# Patient Record
Sex: Female | Born: 1960 | Race: White | Hispanic: No | Marital: Married | Smoking: Former smoker
Health system: Southern US, Community
[De-identification: ages and names within clinical notes are randomized; demographics above are authoritative.]

## PROBLEM LIST (undated history)

## (undated) DIAGNOSIS — E785 Hyperlipidemia, unspecified: Secondary | ICD-10-CM

## (undated) DIAGNOSIS — J45909 Unspecified asthma, uncomplicated: Secondary | ICD-10-CM

## (undated) DIAGNOSIS — M1990 Unspecified osteoarthritis, unspecified site: Secondary | ICD-10-CM

## (undated) DIAGNOSIS — H547 Unspecified visual loss: Secondary | ICD-10-CM

## (undated) DIAGNOSIS — F419 Anxiety disorder, unspecified: Secondary | ICD-10-CM

## (undated) DIAGNOSIS — R011 Cardiac murmur, unspecified: Secondary | ICD-10-CM

## (undated) DIAGNOSIS — D649 Anemia, unspecified: Secondary | ICD-10-CM

## (undated) DIAGNOSIS — T7840XA Allergy, unspecified, initial encounter: Secondary | ICD-10-CM

## (undated) DIAGNOSIS — J189 Pneumonia, unspecified organism: Secondary | ICD-10-CM

## (undated) DIAGNOSIS — K449 Diaphragmatic hernia without obstruction or gangrene: Secondary | ICD-10-CM

## (undated) DIAGNOSIS — M79641 Pain in right hand: Secondary | ICD-10-CM

## (undated) DIAGNOSIS — Z87891 Personal history of nicotine dependence: Secondary | ICD-10-CM

## (undated) DIAGNOSIS — F32A Depression, unspecified: Secondary | ICD-10-CM

## (undated) DIAGNOSIS — M25559 Pain in unspecified hip: Secondary | ICD-10-CM

## (undated) DIAGNOSIS — Z789 Other specified health status: Secondary | ICD-10-CM

## (undated) DIAGNOSIS — I10 Essential (primary) hypertension: Secondary | ICD-10-CM

## (undated) DIAGNOSIS — N200 Calculus of kidney: Secondary | ICD-10-CM

## (undated) DIAGNOSIS — Z01419 Encounter for gynecological examination (general) (routine) without abnormal findings: Secondary | ICD-10-CM

## (undated) DIAGNOSIS — R0602 Shortness of breath: Secondary | ICD-10-CM

## (undated) DIAGNOSIS — Z87442 Personal history of urinary calculi: Secondary | ICD-10-CM

## (undated) DIAGNOSIS — K219 Gastro-esophageal reflux disease without esophagitis: Secondary | ICD-10-CM

## (undated) DIAGNOSIS — M169 Osteoarthritis of hip, unspecified: Secondary | ICD-10-CM

## (undated) DIAGNOSIS — G2581 Restless legs syndrome: Secondary | ICD-10-CM

## (undated) DIAGNOSIS — F329 Major depressive disorder, single episode, unspecified: Secondary | ICD-10-CM

## (undated) HISTORY — DX: Anxiety disorder, unspecified: F41.9

## (undated) HISTORY — PX: OTHER SURGICAL HISTORY: SHX169

## (undated) HISTORY — DX: Osteoarthritis of hip, unspecified: M16.9

## (undated) HISTORY — DX: Restless legs syndrome: G25.81

## (undated) HISTORY — DX: Shortness of breath: R06.02

## (undated) HISTORY — DX: Other specified health status: Z78.9

## (undated) HISTORY — DX: Pain in right hand: M79.641

## (undated) HISTORY — DX: Allergy, unspecified, initial encounter: T78.40XA

## (undated) HISTORY — DX: Unspecified asthma, uncomplicated: J45.909

## (undated) HISTORY — PX: WISDOM TOOTH EXTRACTION: SHX21

## (undated) HISTORY — DX: Calculus of kidney: N20.0

## (undated) HISTORY — DX: Hyperlipidemia, unspecified: E78.5

## (undated) HISTORY — DX: Unspecified visual loss: H54.7

## (undated) HISTORY — DX: Pain in unspecified hip: M25.559

## (undated) HISTORY — DX: Unspecified osteoarthritis, unspecified site: M19.90

## (undated) HISTORY — DX: Depression, unspecified: F32.A

## (undated) HISTORY — DX: Gastro-esophageal reflux disease without esophagitis: K21.9

## (undated) HISTORY — DX: Essential (primary) hypertension: I10

## (undated) HISTORY — DX: Diaphragmatic hernia without obstruction or gangrene: K44.9

## (undated) HISTORY — DX: Encounter for gynecological examination (general) (routine) without abnormal findings: Z01.419

## (undated) HISTORY — DX: Major depressive disorder, single episode, unspecified: F32.9

## (undated) HISTORY — DX: Personal history of nicotine dependence: Z87.891

---

## 1998-09-05 ENCOUNTER — Other Ambulatory Visit: Admission: RE | Admit: 1998-09-05 | Discharge: 1998-09-05 | Payer: Self-pay | Admitting: *Deleted

## 1999-04-18 ENCOUNTER — Encounter: Payer: Self-pay | Admitting: Family Medicine

## 1999-04-18 ENCOUNTER — Encounter: Admission: RE | Admit: 1999-04-18 | Discharge: 1999-04-18 | Payer: Self-pay | Admitting: Family Medicine

## 1999-09-10 ENCOUNTER — Other Ambulatory Visit: Admission: RE | Admit: 1999-09-10 | Discharge: 1999-09-10 | Payer: Self-pay | Admitting: *Deleted

## 2000-11-04 ENCOUNTER — Other Ambulatory Visit: Admission: RE | Admit: 2000-11-04 | Discharge: 2000-11-04 | Payer: Self-pay | Admitting: *Deleted

## 2005-12-11 ENCOUNTER — Ambulatory Visit: Payer: Self-pay | Admitting: Family Medicine

## 2005-12-13 ENCOUNTER — Other Ambulatory Visit: Admission: RE | Admit: 2005-12-13 | Discharge: 2005-12-13 | Payer: Self-pay | Admitting: Obstetrics and Gynecology

## 2005-12-13 ENCOUNTER — Ambulatory Visit (HOSPITAL_COMMUNITY): Admission: RE | Admit: 2005-12-13 | Discharge: 2005-12-13 | Payer: Self-pay | Admitting: Family Medicine

## 2006-02-04 ENCOUNTER — Ambulatory Visit: Payer: Self-pay | Admitting: Family Medicine

## 2006-06-30 ENCOUNTER — Ambulatory Visit: Payer: Self-pay | Admitting: Family Medicine

## 2006-07-01 ENCOUNTER — Emergency Department (HOSPITAL_COMMUNITY): Admission: EM | Admit: 2006-07-01 | Discharge: 2006-07-01 | Payer: Self-pay | Admitting: Emergency Medicine

## 2007-05-05 ENCOUNTER — Ambulatory Visit: Payer: Self-pay | Admitting: Family Medicine

## 2007-06-30 ENCOUNTER — Ambulatory Visit: Payer: Self-pay | Admitting: Family Medicine

## 2007-07-15 ENCOUNTER — Ambulatory Visit: Payer: Self-pay | Admitting: Family Medicine

## 2007-09-11 ENCOUNTER — Ambulatory Visit: Payer: Self-pay | Admitting: Family Medicine

## 2008-05-02 ENCOUNTER — Ambulatory Visit: Payer: Self-pay | Admitting: Family Medicine

## 2008-06-14 ENCOUNTER — Ambulatory Visit: Payer: Self-pay | Admitting: Family Medicine

## 2008-08-31 ENCOUNTER — Ambulatory Visit: Payer: Self-pay | Admitting: Family Medicine

## 2008-10-12 ENCOUNTER — Ambulatory Visit: Payer: Self-pay | Admitting: Family Medicine

## 2009-04-25 ENCOUNTER — Ambulatory Visit: Payer: Self-pay | Admitting: Family Medicine

## 2009-04-26 ENCOUNTER — Encounter: Admission: RE | Admit: 2009-04-26 | Discharge: 2009-04-26 | Payer: Self-pay | Admitting: Family Medicine

## 2009-05-01 ENCOUNTER — Ambulatory Visit: Payer: Self-pay | Admitting: Family Medicine

## 2009-07-19 ENCOUNTER — Other Ambulatory Visit: Admission: RE | Admit: 2009-07-19 | Discharge: 2009-07-19 | Payer: Self-pay | Admitting: Obstetrics and Gynecology

## 2009-07-19 ENCOUNTER — Ambulatory Visit: Payer: Self-pay | Admitting: Family Medicine

## 2009-11-09 ENCOUNTER — Ambulatory Visit: Payer: Self-pay | Admitting: Physician Assistant

## 2010-06-22 ENCOUNTER — Ambulatory Visit: Admit: 2010-06-22 | Payer: Self-pay | Admitting: Family Medicine

## 2010-06-22 ENCOUNTER — Ambulatory Visit
Admission: RE | Admit: 2010-06-22 | Discharge: 2010-06-22 | Payer: Self-pay | Source: Home / Self Care | Attending: Family Medicine | Admitting: Family Medicine

## 2010-09-12 ENCOUNTER — Ambulatory Visit (INDEPENDENT_AMBULATORY_CARE_PROVIDER_SITE_OTHER): Payer: Self-pay | Admitting: Family Medicine

## 2010-09-12 DIAGNOSIS — K117 Disturbances of salivary secretion: Secondary | ICD-10-CM

## 2010-09-12 DIAGNOSIS — I10 Essential (primary) hypertension: Secondary | ICD-10-CM

## 2010-09-12 DIAGNOSIS — E7800 Pure hypercholesterolemia, unspecified: Secondary | ICD-10-CM

## 2010-11-06 ENCOUNTER — Telehealth: Payer: Self-pay | Admitting: Family Medicine

## 2010-11-06 MED ORDER — MOMETASONE FUROATE 50 MCG/ACT NA SUSP: NASAL | Status: DC

## 2010-11-07 ENCOUNTER — Ambulatory Visit (INDEPENDENT_AMBULATORY_CARE_PROVIDER_SITE_OTHER): Payer: Self-pay | Admitting: Medical

## 2010-11-07 ENCOUNTER — Encounter: Payer: Self-pay | Admitting: Medical

## 2010-11-07 VITALS — BP 108/78 | HR 72 | Temp 98.3°F | Ht 62.0 in | Wt 185.0 lb

## 2010-11-07 DIAGNOSIS — J40 Bronchitis, not specified as acute or chronic: Secondary | ICD-10-CM

## 2010-11-07 DIAGNOSIS — B079 Viral wart, unspecified: Secondary | ICD-10-CM

## 2010-11-07 DIAGNOSIS — J329 Chronic sinusitis, unspecified: Secondary | ICD-10-CM

## 2010-11-07 DIAGNOSIS — B356 Tinea cruris: Secondary | ICD-10-CM

## 2010-11-07 MED ORDER — AZITHROMYCIN 250 MG PO TABS: ORAL_TABLET | ORAL | Status: AC

## 2010-11-07 MED ORDER — ESOMEPRAZOLE MAGNESIUM 40 MG PO CPDR: 40.0000 mg | DELAYED_RELEASE_CAPSULE | Freq: Every day | ORAL | Status: DC

## 2010-11-07 MED ORDER — BUDESONIDE-FORMOTEROL FUMARATE 160-4.5 MCG/ACT IN AERO: 2.0000 | INHALATION_SPRAY | Freq: Two times a day (BID) | RESPIRATORY_TRACT | Status: DC

## 2010-11-07 MED ORDER — NYSTATIN 100000 UNIT/GM EX CREA: TOPICAL_CREAM | Freq: Two times a day (BID) | CUTANEOUS | Status: DC

## 2010-11-07 NOTE — Patient Instructions (Signed)
Bronchitis and sinusitis -- discussed symptomatic treatment, can use Coricidin HBP over-the-counter, Z-Pak, cough not improving or worse  Tinea cruris/vaginal rash-nystatin cream topically twice a day, keep area dry  Warts-continue your current treatment

## 2010-11-07 NOTE — Progress Notes (Signed)
Subjective:     Shannon Mckenzie is a 50 y.o. female who presents for evaluation of productive cough.  Symptoms include congestion, facial pain, fever subjective, shortness of breath, sinus pressure and sore throat. Onset of symptoms was 2 weeks ago, and has been gradually worsening since that time. Treatment to date: nasal steroids.  They note hx/o bronchitis in the past.  Denies sick contacts.  No other aggravating or relieving factors.    She complains of dry mouth. Was seen on 09/12/10 here for the same, given Magic Mouthwash, and she used by [employer] over-the-counter, and this has improved but not completely gone away.  She complains of a rash on her genital area and x2 weeks, and she notes staying moist and sweaty in this area, change her underwear regularly. Using some over-the-counter vaginal itch cream with some relief. Denies blisters or sores, no concern for STD and, is married.  She notes that she gets warts somewhat frequently, and using a medicated Band-Aid for her current wart on her right thigh.  Past Medical History  Diagnosis Date  . Hiatal hernia   . GERD (gastroesophageal reflux disease)   . Allergy   . Hyperlipidemia   . Anxiety   . COPD (chronic obstructive pulmonary disease)   . DJD (degenerative joint disease) of hip      The following portions of the patient's history were reviewed and updated as appropriate: allergies, current medications, past family history, past medical history, past social history, past surgical history and problem list.  Review of Systems Constitutional: +low grade fever; denies chills, sweats, anorexia Skin: Positive vaginal rash, positive for wart right HEENT: +sore throat; denies ear pain, itchy watery eyes Cardiovascular: denies chest pain, palpitations Lungs: +mild SOB, +productive sputum; denies wheezing, hemoptysis, orthopnea, PND Abdomen: denies abdominal pain, nausea, vomiting, diarrhea GU: denies dysuria Extremities: denies  edema, myalgias, arthralgias  Objective:   Filed Vitals:   11/07/10 1025  BP: 108/78  Pulse: 72  Temp: 98.3 F (36.8 C)    General appearance: Alert, WD/WN, no distress, mildly ill appearing                             Skin: Intertrigous areas bilateral in the genital region with linear patch of erythema that is flat and circumscribed, right upper thigh with 1 cm raised verruca appearing lesion that appears white treatment, otherwise no lesions, warm, no diaphoresis                           Head: Mild maxillary sinus tenderness                            Eyes: conjunctiva normal, corneas clear, PERRLA                            Ears: pearly TMs, external ear canals normal                          Nose: septum midline, turbinates swollen, with erythema and clear discharge             Mouth/throat: MMM, tongue normal, mild pharyngeal erythema                           Neck: supple,  no adenopathy, no thyromegaly, nontender                          Heart: RRR, normal S1, S2, no murmurs                         Lungs: +bronchial breath sounds, +scattered rhonchi, no wheezes, no rales                Extremities: no edema, nontender     Assessment:   Encounter Diagnoses  Name Primary?  . Sinusitis   . Bronchitis Yes  . Tinea cruris   . Viral warts      Plan:   Bronchitis and sinusitis-Prescription given today for Zpak as below.  Discussed diagnosis and treatment of bronchitis and sinusitis.  Suggested symptomatic OTC remedies for cough and congestion.  Nasal saline spray for nasal congestion.  Tylenol or Ibuprofen OTC for fever and malaise.  Call/return in 2-3 days if symptoms are worse or not improving.  Advised that cough may linger even after the infection is improved.     Dry mouth, improved, and that she always rinse with water after using her Symbicort  Tinea cruris-nystatin cream  Warts-continue over-the-counter wart remover as it is helping  I refilled her medications  (Symbicort and Nexium) and completed the form for [employer] Patient Assistance Program

## 2010-11-08 NOTE — Telephone Encounter (Signed)
Samples given.  

## 2010-11-21 ENCOUNTER — Telehealth: Payer: Self-pay | Admitting: Family Medicine

## 2010-11-21 MED ORDER — FLUTICASONE PROPIONATE (INHAL) 50 MCG/BLIST IN AEPB: 2.0000 | INHALATION_SPRAY | Freq: Two times a day (BID) | RESPIRATORY_TRACT | Status: DC

## 2010-11-21 NOTE — Telephone Encounter (Signed)
Pt needs refill on Fluticasone to [facility] [address]

## 2010-11-26 ENCOUNTER — Telehealth: Payer: Self-pay | Admitting: Family Medicine

## 2010-11-26 MED ORDER — FLUTICASONE PROPIONATE 50 MCG/ACT NA SUSP: 1.0000 | Freq: Every day | NASAL | Status: DC

## 2010-11-26 NOTE — Telephone Encounter (Signed)
Med sent in.

## 2010-12-13 ENCOUNTER — Other Ambulatory Visit: Payer: Self-pay | Admitting: Family Medicine

## 2011-03-18 ENCOUNTER — Other Ambulatory Visit: Payer: Self-pay | Admitting: Family Medicine

## 2011-03-19 ENCOUNTER — Other Ambulatory Visit: Payer: Self-pay

## 2011-03-19 NOTE — Telephone Encounter (Signed)
Is this ok?

## 2011-03-19 NOTE — Telephone Encounter (Signed)
Looks like Dr.L refilled zoloft

## 2011-03-30 ENCOUNTER — Other Ambulatory Visit: Payer: Self-pay | Admitting: Family Medicine

## 2011-04-01 DIAGNOSIS — Z139 Encounter for screening, unspecified: Secondary | ICD-10-CM

## 2011-05-08 ENCOUNTER — Encounter: Payer: Self-pay | Admitting: Family Medicine

## 2011-07-09 ENCOUNTER — Encounter: Payer: Self-pay | Admitting: Internal Medicine

## 2011-07-11 ENCOUNTER — Other Ambulatory Visit: Payer: Self-pay | Admitting: Family Medicine

## 2011-08-20 ENCOUNTER — Telehealth: Payer: Self-pay | Admitting: Internal Medicine

## 2011-08-20 NOTE — Telephone Encounter (Signed)
pt states that she thinks she has a sinus infection. she has drainage in throat and head hurts and she coughs up green stuff and wanted to know if you would call something in for her, [facility] at [address]. she said she don't have insurance so she dont really have the money to come in

## 2011-08-20 NOTE — Telephone Encounter (Signed)
Pt has appt

## 2011-08-20 NOTE — Telephone Encounter (Signed)
Tell her I will work with her to keep her cost down but she needs to come in for an appointment

## 2011-08-21 ENCOUNTER — Telehealth: Payer: Self-pay | Admitting: Internal Medicine

## 2011-08-21 ENCOUNTER — Encounter: Payer: Self-pay | Admitting: Family Medicine

## 2011-08-21 ENCOUNTER — Ambulatory Visit (INDEPENDENT_AMBULATORY_CARE_PROVIDER_SITE_OTHER): Payer: Self-pay | Admitting: Family Medicine

## 2011-08-21 VITALS — BP 130/80 | HR 79 | Temp 98.3°F | Wt 176.0 lb

## 2011-08-21 DIAGNOSIS — J019 Acute sinusitis, unspecified: Secondary | ICD-10-CM

## 2011-08-21 DIAGNOSIS — J209 Acute bronchitis, unspecified: Secondary | ICD-10-CM

## 2011-08-21 MED ORDER — LEVOFLOXACIN 500 MG PO TABS: 500.0000 mg | ORAL_TABLET | Freq: Every day | ORAL | Status: DC

## 2011-08-21 MED ORDER — AZITHROMYCIN 250 MG PO TABS: ORAL_TABLET | ORAL | Status: AC

## 2011-08-21 NOTE — Progress Notes (Signed)
  Subjective:    Patient ID: Shannon Mckenzie, female    DOB: 05/08/1961, 51 y.o.   MRN: [id number]  HPI She has a week long history of nasal congestion, occasional fluid feeling in her left ear. She now has a productive cough. The nasal congestion and headache are getting worse. She does not smoke and does not have allergies   Review of Systems     Objective:   Physical Exam alert and in no distress. Tympanic membranes and canals are normal. Throat is clear. Tonsils are normal. Neck is supple without adenopathy or thyromegaly. Cardiac exam shows a regular sinus rhythm without murmurs or gallops. Lungs are clear to auscultation.       Assessment & Plan:   1. Sinusitis acute   2. Bronchitis, acute    I will give her Levaquin to help keep her cost down. She will call if not entirely better.

## 2011-08-21 NOTE — Telephone Encounter (Signed)
Cancelled the levaquin cause it costed $90.70. And got permission from [employer] [employer] to do the zpak #[id number]. Take 2 pills on day 1 and 1 pill days 2-5 no refills.

## 2011-09-25 ENCOUNTER — Other Ambulatory Visit: Payer: Self-pay | Admitting: Family Medicine

## 2011-10-21 ENCOUNTER — Other Ambulatory Visit: Payer: Self-pay | Admitting: Family Medicine

## 2011-10-28 ENCOUNTER — Encounter: Payer: Self-pay | Admitting: Medical

## 2011-10-28 ENCOUNTER — Ambulatory Visit (INDEPENDENT_AMBULATORY_CARE_PROVIDER_SITE_OTHER): Payer: Self-pay | Admitting: Medical

## 2011-10-28 VITALS — BP 112/78 | HR 78 | Temp 98.1°F | Resp 16 | Wt 178.0 lb

## 2011-10-28 DIAGNOSIS — F32A Depression, unspecified: Secondary | ICD-10-CM

## 2011-10-28 DIAGNOSIS — J45909 Unspecified asthma, uncomplicated: Secondary | ICD-10-CM

## 2011-10-28 DIAGNOSIS — J449 Chronic obstructive pulmonary disease, unspecified: Secondary | ICD-10-CM

## 2011-10-28 DIAGNOSIS — F329 Major depressive disorder, single episode, unspecified: Secondary | ICD-10-CM

## 2011-10-28 MED ORDER — ALBUTEROL SULFATE HFA 108 (90 BASE) MCG/ACT IN AERS: 2.0000 | INHALATION_SPRAY | Freq: Four times a day (QID) | RESPIRATORY_TRACT | Status: DC | PRN

## 2011-10-28 NOTE — Patient Instructions (Signed)
Symptoms today suggest allergic bronchitis.  Recommendations:  continue Allegra daily at bedtime for the next 2-4 weeks  Continue Symbiotic 2 puffs twice daily for prevention   Begin Albuterol RESCUE inhaler, 2 puffs every 4-6 hours as needed, or at least twice daily the next 5-7 days  Don't do anything really strenuous for the next few days  Avoid begin outside when the pollen is particularly bad  Use some Ibuprofen or Aleve at least twice daily this week

## 2011-10-28 NOTE — Progress Notes (Signed)
Subjective: Here for c/o intermittent cough, chest congestion, and worse symptoms with deep breathing.  She has seasonal allergies in general, been having sinus pressure, headache, hoarse voice, nasal congestion.  In the last few days worse cough, clear production, ongoing drainage and head congestion, but also been feeling some SOB and wheezing at times. She has hx/o COPD, stopped smoking 15-20 years ago. Been using Symbicort twice daily, but has not had albuterol inhaler in some time now.  Last week her chest congestion was worse on the left, but now right side and all of chest is tight, achy, and deep breathing makes chest hurt worse.  She denies fever, chest pain, palpitations, no sick contacts, no edema.  Has hx/o GERD, but no recent worsening reflux, heartburn.  Uses Nexium daily.     She notes lately having more stress, irritability.  Her son moved in with her 652mo ago.  He separated from wife 652mo ago, and the housing was suppose to be temporary, but he has been there 27mo+.  He is 50 yo.  She also notes stressing about her COPD diagnosis, routine daily stress such as finances, work.  She works Education officer, environmental houses.  Been on Zoloft at higher and lower doses prior.    Past Medical History  Diagnosis Date  . Hiatal hernia   . GERD (gastroesophageal reflux disease)   . Allergy   . Hyperlipidemia   . Anxiety   . COPD (chronic obstructive pulmonary disease)   . DJD (degenerative joint disease) of hip   . Depression   . DJD (degenerative joint disease)   . Substance abuse     FORMER SMOKER  . Dyslipidemia   . HH (hiatus hernia)     Objective:   Physical Exam  Filed Vitals:   10/28/11 1045  BP: 112/78  Pulse: 78  Temp: 98.1 F (36.7 C)  Resp: 16    General appearance: alert, no distress, WD/WN HEENT: normocephalic, sclerae anicteric, TMs pearly, nares with turbinate swelling and clear discharge or erythema, pharynx normal Oral cavity: MMM, no lesions Neck: supple, no lymphadenopathy, no  thyromegaly, no masses Heart: RRR, normal S1, S2, no murmurs Lungs: few rhonchi, otherwise no wheezes, rales Pulses: 2+ symmetric Ext: no edema, cyanosis   Assessment and Plan :    Encounter Diagnoses  Name Primary?  . Allergic bronchitis Yes  . COPD (chronic obstructive pulmonary disease)   . Depression    Allergic bronchitis - Symptoms and exam suggest allergic bronchitis.  Gave albuterol sample, gave demo, and had her use 2 puffs in office.   After about 10 minutes felt improvement.  Advise she begin Allegra daily, c/t Symbicort, use albuterol 2 puffs q4-6 hours, avoid allergen triggers, and for chest achiness, begin Aleve.  Call if worse or not improving.  Recheck 2wk.   COPD - c/t Symbicort BID.  Depression - c/t Zoloft, work on stress reduction. Advised she discuss with son about him getting his own housing.

## 2011-11-14 ENCOUNTER — Other Ambulatory Visit: Payer: Self-pay | Admitting: Medical

## 2011-11-14 MED ORDER — ESOMEPRAZOLE MAGNESIUM 40 MG PO CPDR: 40.0000 mg | DELAYED_RELEASE_CAPSULE | Freq: Every day | ORAL | Status: DC

## 2011-11-14 MED ORDER — BUDESONIDE-FORMOTEROL FUMARATE 160-4.5 MCG/ACT IN AERO: 2.0000 | INHALATION_SPRAY | Freq: Two times a day (BID) | RESPIRATORY_TRACT | Status: DC

## 2011-12-03 ENCOUNTER — Telehealth: Payer: Self-pay | Admitting: Medical

## 2011-12-03 NOTE — Telephone Encounter (Signed)
LM

## 2012-01-20 ENCOUNTER — Other Ambulatory Visit: Payer: Self-pay

## 2012-01-20 MED ORDER — LISINOPRIL-HYDROCHLOROTHIAZIDE 10-12.5 MG PO TABS: 1.0000 | ORAL_TABLET | Freq: Every day | ORAL | Status: DC

## 2012-01-20 NOTE — Telephone Encounter (Signed)
Med sent in.

## 2012-03-20 ENCOUNTER — Encounter: Payer: Self-pay | Admitting: Medical

## 2012-03-20 ENCOUNTER — Telehealth: Payer: Self-pay | Admitting: Family Medicine

## 2012-03-20 ENCOUNTER — Ambulatory Visit (INDEPENDENT_AMBULATORY_CARE_PROVIDER_SITE_OTHER): Payer: BC Managed Care – PPO | Admitting: Medical

## 2012-03-20 VITALS — BP 130/82 | HR 68 | Temp 98.1°F | Resp 16 | Wt 185.0 lb

## 2012-03-20 DIAGNOSIS — K219 Gastro-esophageal reflux disease without esophagitis: Secondary | ICD-10-CM

## 2012-03-20 DIAGNOSIS — R490 Dysphonia: Secondary | ICD-10-CM

## 2012-03-20 DIAGNOSIS — J329 Chronic sinusitis, unspecified: Secondary | ICD-10-CM

## 2012-03-20 DIAGNOSIS — Z23 Encounter for immunization: Secondary | ICD-10-CM

## 2012-03-20 MED ORDER — AZITHROMYCIN 250 MG PO TABS: ORAL_TABLET | ORAL | Status: DC

## 2012-03-20 MED ORDER — NASONEX 50 MCG/ACT NA SUSP: NASAL | Status: DC

## 2012-03-20 NOTE — Progress Notes (Signed)
Subjective: Here for possible sinus infection.   She notes several day hx/o sinus pressure, eye pressure, can't breath through nose, nasal congestion, sore throat, ears bothering her, and she notes hx/o sinus infections.   Using nothing for symptoms.  She ran out of nasonex.  She is a former smoker.    She also notes hoarseness intermittent for a few weeks. Loses voice at times, but no sore throat.    She has a long hx/o GERD, x over 10 years.  No prior colonoscopy.    She also reports fatigue, night seats at times, but still getting normal periods.    Past Medical History  Diagnosis Date  . Hiatal hernia   . GERD (gastroesophageal reflux disease)   . Allergy   . Hyperlipidemia   . Anxiety   . COPD (chronic obstructive pulmonary disease)   . DJD (degenerative joint disease) of hip   . Depression   . DJD (degenerative joint disease)   . Substance abuse     FORMER SMOKER  . Dyslipidemia   . HH (hiatus hernia)    ROS as noted above in HPI   Objective:   Physical Exam  Filed Vitals:   03/20/12 0935  BP: 130/82  Pulse: 68  Temp: 98.1 F (36.7 C)  Resp: 16    General appearance: alert, no distress, WD/WN HEENT: normocephalic, sclerae anicteric, +sinus tenderness, TMs pearly, nares patent, no discharge or erythema, pharynx normal Oral cavity: MMM, no lesions Neck: supple, no lymphadenopathy, no thyromegaly, no masses Heart: RRR, normal S1, S2, no murmurs Lungs: CTA bilaterally, no wheezes, rhonchi, or rales Abdomen: +bs, soft, non tender, non distended, no masses, no hepatomegaly, no splenomegaly   Assessment and Plan :    Encounter Diagnoses  Name Primary?  . Sinusitis Yes  . Hoarseness   . GERD (gastroesophageal reflux disease)   . Need for influenza vaccination   . Need for pneumococcal vaccination    Sinusitis - begin Zpak, Mucinex OTC, increase water intake.  Rest, call if not resolving. Refilled Fluticasone.  Hoarsness/GERD - possibly GERD related.  Will  refer to GI for eval which will likely include EGD for 10+ years hx/o GERD, screening colonoscopy and to help evaluate for hoarseness.    Vaccine counseling, VIS and vaccine given for influenza and pneumococcal.    Return soon for physical.

## 2012-03-20 NOTE — Telephone Encounter (Signed)
PATIENT IS AWARE OF HER APPOINTMENT AT [facility] TO SEE DR. Randa Evens ON 04/13/12 @ 300 PM. [phone] CLS  I FAX OVER OV NOTES, LAB AND DEMOGRAPHICS. CLS

## 2012-03-23 ENCOUNTER — Telehealth: Payer: Self-pay | Admitting: Family Medicine

## 2012-03-23 NOTE — Telephone Encounter (Signed)
Message copied by Janeice Robinson on Mon Mar 23, 2012  9:15 AM ------      Message from: Jac Canavan      Created: Fri Mar 20, 2012 10:41 PM       Refer to GI/ Dr. Randa Evens across from [facility] for long hx/o GERD, hoarseness, first screening colonoscopy.

## 2012-03-23 NOTE — Telephone Encounter (Signed)
PATIENT IS AWARE OF HER APPOINTMENT TO SEE DR. Randa Mckenzie ON 04/13/12 @ 300 PM. CLS

## 2012-04-06 ENCOUNTER — Ambulatory Visit (INDEPENDENT_AMBULATORY_CARE_PROVIDER_SITE_OTHER): Payer: BC Managed Care – PPO | Admitting: Medical

## 2012-04-06 ENCOUNTER — Encounter: Payer: Self-pay | Admitting: Medical

## 2012-04-06 VITALS — BP 100/70 | HR 96 | Temp 98.1°F | Resp 18 | Wt 179.0 lb

## 2012-04-06 DIAGNOSIS — E785 Hyperlipidemia, unspecified: Secondary | ICD-10-CM

## 2012-04-06 DIAGNOSIS — J449 Chronic obstructive pulmonary disease, unspecified: Secondary | ICD-10-CM

## 2012-04-06 DIAGNOSIS — I10 Essential (primary) hypertension: Secondary | ICD-10-CM

## 2012-04-06 MED ORDER — HYDROCODONE-HOMATROPINE 5-1.5 MG/5ML PO SYRP: 5.0000 mL | ORAL_SOLUTION | Freq: Three times a day (TID) | ORAL | Status: DC | PRN

## 2012-04-06 MED ORDER — LEVOFLOXACIN 500 MG PO TABS: 500.0000 mg | ORAL_TABLET | Freq: Every day | ORAL | Status: DC

## 2012-04-06 MED ORDER — ALBUTEROL SULFATE HFA 108 (90 BASE) MCG/ACT IN AERS: 2.0000 | INHALATION_SPRAY | Freq: Four times a day (QID) | RESPIRATORY_TRACT | Status: DC | PRN

## 2012-04-06 NOTE — Progress Notes (Signed)
Subjective: Here for c/o chest tightness, cough, coughing to the point of throat getting raw and now has a bad headache.   Has some yellow green productive sputum, congestion settling in chest.  Denies fever, nausea, vomiting, ear pain, wheezing.   Using symbicort BID, but ran out of inhaler albuterol.   No other aggravating or relieving factors.    She is also fasting today, wants her labs done in advance of her physical next week.  Past Medical History  Diagnosis Date  . Hiatal hernia   . GERD (gastroesophageal reflux disease)   . Allergy   . Hyperlipidemia   . Anxiety   . COPD (chronic obstructive pulmonary disease)   . DJD (degenerative joint disease) of hip   . Depression   . DJD (degenerative joint disease)   . Substance abuse     FORMER SMOKER  . Dyslipidemia   . HH (hiatus hernia)    ROS as in HPI  Objective Gen: wd, wn, nad Heent: head nontender, TMs pearly, nares patent, pharynx normal Lungs: decreased breath sounds, but no wheezes or rales, no rhonchi Heart: RRR, normal S1, S2, no murmurs Ext: no edema Pulses normal  CXR: increased markings in lower fields and in hilar region suggestive of bronchitis, but no obvious mass or pneumonia.  Cardiac silhouette normal contours. CXR sent for radiology overread.  Assessment: Encounter Diagnoses  Name Primary?  Marland Kitchen COPD (chronic obstructive pulmonary disease) Yes  . Essential hypertension, benign   . Hyperlipidemia    COPD - c/t symbicort, begin Levaquin, albuterol inhaler TID for chest tightness and cough, hycodan syrup prn cough.  Call/return if not improving by mid week.  Can consider steroid taper if needed.  HTN and hyperlipidemia - labs today in advance of her physical next week fasting.

## 2012-04-07 LAB — CBC WITH DIFFERENTIAL/PLATELET
Basophils Relative: 0 % (ref 0–1)
Eosinophils Absolute: 0.3 10*3/uL (ref 0.0–0.7)
Eosinophils Relative: 3 % (ref 0–5)
Hemoglobin: 13.6 g/dL (ref 12.0–15.0)
Lymphs Abs: 1.6 10*3/uL (ref 0.7–4.0)
MCH: 26.4 pg (ref 26.0–34.0)
MCHC: 34.3 g/dL (ref 30.0–36.0)
MCV: 77.1 fL — ABNORMAL LOW (ref 78.0–100.0)
Monocytes Relative: 7 % (ref 3–12)
Platelets: 250 10*3/uL (ref 150–400)
RBC: 5.15 MIL/uL — ABNORMAL HIGH (ref 3.87–5.11)

## 2012-04-07 LAB — LIPID PANEL
Total CHOL/HDL Ratio: 4.4 Ratio
VLDL: 16 mg/dL (ref 0–40)

## 2012-04-07 LAB — COMPREHENSIVE METABOLIC PANEL
AST: 15 U/L (ref 0–37)
Alkaline Phosphatase: 92 U/L (ref 39–117)
Glucose, Bld: 85 mg/dL (ref 70–99)
Potassium: 3.9 mEq/L (ref 3.5–5.3)
Sodium: 137 mEq/L (ref 135–145)
Total Bilirubin: 0.5 mg/dL (ref 0.3–1.2)
Total Protein: 7.3 g/dL (ref 6.0–8.3)

## 2012-04-07 LAB — TSH: TSH: 1.965 u[IU]/mL (ref 0.350–4.500)

## 2012-04-15 ENCOUNTER — Ambulatory Visit (INDEPENDENT_AMBULATORY_CARE_PROVIDER_SITE_OTHER): Payer: BC Managed Care – PPO | Admitting: Medical

## 2012-04-15 ENCOUNTER — Encounter: Payer: Self-pay | Admitting: Medical

## 2012-04-15 VITALS — BP 112/78 | HR 80 | Temp 98.0°F | Resp 16 | Ht 63.0 in | Wt 183.0 lb

## 2012-04-15 DIAGNOSIS — I10 Essential (primary) hypertension: Secondary | ICD-10-CM

## 2012-04-15 DIAGNOSIS — Z0000 Encounter for general adult medical examination without abnormal findings: Secondary | ICD-10-CM

## 2012-04-15 DIAGNOSIS — K219 Gastro-esophageal reflux disease without esophagitis: Secondary | ICD-10-CM

## 2012-04-15 DIAGNOSIS — J449 Chronic obstructive pulmonary disease, unspecified: Secondary | ICD-10-CM

## 2012-04-15 DIAGNOSIS — R0602 Shortness of breath: Secondary | ICD-10-CM

## 2012-04-15 DIAGNOSIS — E785 Hyperlipidemia, unspecified: Secondary | ICD-10-CM

## 2012-04-15 DIAGNOSIS — Z888 Allergy status to other drugs, medicaments and biological substances status: Secondary | ICD-10-CM

## 2012-04-15 DIAGNOSIS — R0683 Snoring: Secondary | ICD-10-CM

## 2012-04-15 DIAGNOSIS — G478 Other sleep disorders: Secondary | ICD-10-CM

## 2012-04-15 DIAGNOSIS — Z789 Other specified health status: Secondary | ICD-10-CM

## 2012-04-15 DIAGNOSIS — G2581 Restless legs syndrome: Secondary | ICD-10-CM

## 2012-04-15 DIAGNOSIS — E669 Obesity, unspecified: Secondary | ICD-10-CM | POA: Insufficient documentation

## 2012-04-15 DIAGNOSIS — G479 Sleep disorder, unspecified: Secondary | ICD-10-CM

## 2012-04-15 DIAGNOSIS — R0609 Other forms of dyspnea: Secondary | ICD-10-CM

## 2012-04-15 DIAGNOSIS — R0989 Other specified symptoms and signs involving the circulatory and respiratory systems: Secondary | ICD-10-CM

## 2012-04-15 MED ORDER — EZETIMIBE-SIMVASTATIN 10-10 MG PO TABS: 1.0000 | ORAL_TABLET | Freq: Every day | ORAL | Status: DC

## 2012-04-15 MED ORDER — ROPINIROLE HCL 0.25 MG PO TABS: ORAL_TABLET | ORAL | Status: DC

## 2012-04-15 NOTE — Progress Notes (Signed)
Subjective:   HPI  Shannon Mckenzie is a 51 y.o. female who presents for a complete physical.  Legs seem to move and shake in the evenings like they can't be still.  Not painful, just aggravating.  Improved with hot bath.  Wonders about RLS  She notes that all statins have caused leg aches/muscle aches.   Wonders about sleep apnea.  Seems to have SOB often.  Uses symbicort BID which helps.  Does not feel rested in the mornings.  Snores, not sure about apnea events.   She has f/u scheduled with dermatology, hsa lesions on her back she is concerned about as well as bumps on her legs.    HTN - compliant with medication.  Saw Dr. Randa Evens recently/GI.  Has EGD and colonoscopy planned for 06/2012.  He increased her Nexium to BID.  Sees gynecology, up to date on pap and mammogram.  Preventative care: Last ophthalmology visit: sees yearly Last dental visit: followed by dentist and orthodontics  Last tetanus booster: 2006 Flu vaccine: done 02/2012 here  Reviewed their medical, surgical, family, social, medication, and allergy history and updated chart as appropriate.  Past Medical History  Diagnosis Date  . GERD (gastroesophageal reflux disease)   . Allergy   . Hyperlipidemia   . Anxiety   . COPD (chronic obstructive pulmonary disease)   . Depression   . DJD (degenerative joint disease) of hip   . Former smoker   . Routine gynecological examination     sees gynecology  . Hiatal hernia   . Vision problems     wears contact lenses   Past Surgical History  Procedure Date  . Colonoscopy     pending 06/2012 with Dr. Ramon Dredge     Family History  Problem Relation Age of Onset  . Arthritis Mother   . Asthma Mother   . Diabetes Mother   . Cirrhosis Mother     died of liver nonalcoholic cirrhosis  . COPD Mother   . Heart disease Mother     CHF  . Asthma Sister   . Cancer Father     died of pancreatic cancer  . Depression Brother   . Stroke Maternal Aunt     died age 60yo of  CVA  . Other Brother     died of MVA    History   Social History  . Marital Status: Married    Spouse Name: N/A    Number of Children: N/A  . Years of Education: N/A   Occupational History  .      cleans houses   Social History Main Topics  . Smoking status: Former Smoker -- 1.0 packs/day for 15 years  . Smokeless tobacco: Never Used  . Alcohol Use: No  . Drug Use: No  . Sexually Active: Not on file   Other Topics Concern  . Not on file   Social History Narrative   Married, 1 son, no exercise other than on the job, [employer]    Current Outpatient Prescriptions on File Prior to Visit  Medication Sig Dispense Refill  . albuterol (PROVENTIL HFA;VENTOLIN HFA) 108 (90 BASE) MCG/ACT inhaler Inhale 2 puffs into the lungs every 6 (six) hours as needed for wheezing.  1 Inhaler  0  . albuterol (VENTOLIN HFA) 108 (90 BASE) MCG/ACT inhaler Inhale 2 puffs into the lungs every 6 (six) hours as needed for wheezing.  1 Inhaler  1  . budesonide-formoterol (SYMBICORT) 160-4.5 MCG/ACT inhaler Inhale 2 puffs into the lungs 2 (  two) times daily.  3 Inhaler  3  . lisinopril-hydrochlorothiazide (PRINZIDE,ZESTORETIC) 10-12.5 MG per tablet Take 1 tablet by mouth daily.  90 tablet  1  . NASONEX 50 MCG/ACT nasal spray Gave 2 sample boxes 28 metered sprays per box  7.5 g  5  . sertraline (ZOLOFT) 100 MG tablet 150 mg.       . DISCONTD: esomeprazole (NEXIUM) 40 MG capsule Take 1 capsule (40 mg total) by mouth daily before breakfast.  90 capsule  3  . guaiFENesin (MUCINEX) 600 MG 12 hr tablet Take 1,200 mg by mouth 2 (two) times daily.      Marland Kitchen HYDROcodone-homatropine (HYCODAN) 5-1.5 MG/5ML syrup Take 5 mLs by mouth every 8 (eight) hours as needed for cough.  120 mL  0  . levofloxacin (LEVAQUIN) 500 MG tablet Take 1 tablet (500 mg total) by mouth daily.  7 tablet  0    Allergies  Allergen Reactions  . Amoxicillin Hives and Rash   Review of Systems Constitutional: -fever, -chills, -sweats,  -unexpected weight change, -anorexia, -fatigue Allergy: -sneezing, -itching, -congestion Dermatology: denies changing moles, rash, lumps, new worrisome lesions ENT: -runny nose, -ear pain, -sore throat, -hoarseness, -sinus pain, -teeth pain, -tinnitus, -hearing loss, -epistaxis Cardiology:  -chest pain, -palpitations, -edema, -orthopnea, -paroxysmal nocturnal dyspnea Respiratory: -cough, -shortness of breath, -dyspnea on exertion, -wheezing, -hemoptysis Gastroenterology: -abdominal pain, -nausea, -vomiting, -diarrhea, -constipation, -blood in stool, -changes in bowel movement, -dysphagia Hematology: -bleeding or bruising problems Musculoskeletal: -arthralgias, -myalgias, -joint swelling, -back pain, -neck pain, -cramping, -gait changes Ophthalmology: -vision changes, -eye redness, -itching, -discharge Urology: -dysuria, -difficulty urinating, -hematuria, -urinary frequency, -urgency, incontinence Neurology: -headache, -weakness, -tingling, -numbness, -speech abnormality, -memory loss, -falls, -dizziness Psychology:  -depressed mood, -agitation, -sleep problems   Adult ECG Report  Indication: physical, SOB  Rate: 61bpm  Rhythm: normal sinus rhythm  QRS Axis: 40 degrees  PR Interval:  QRS Duration: 74ms  QTc:  Conduction Disturbances: none  Other Abnormalities: none  Patient's cardiac risk factors are: dyslipidemia, hypertension and obesity (BMI >= 30 kg/m2).  EKG comparison: 04/2009 EKG  Narrative Interpretation: normal EKG, no changes     Objective:   Physical Exam  Filed Vitals:   04/15/12 1003  BP: 112/78  Pulse: 80  Temp: 98 F (36.7 C)  Resp: 16    General appearance: alert, no distress, WD/WN, obese white female Skin: scattered macules, scattered crusted raised pink/chalky appearing lesions throughout legs, possibly AKs or warts, upper bilat back with stuck on appearing brown verrucal lesions that are likely seborrheic keratosis HEENT: normocephalic,  conjunctiva/corneas normal, sclerae anicteric, PERRLA, EOMi, nares patent, no discharge or erythema, pharynx normal Oral cavity: MMM, tongue normal, teeth missing some molars, partial upper plate, otherwise teeth in good repair Neck: supple, no lymphadenopathy, no thyromegaly, no masses, normal ROM, no bruits Chest: non tender, normal shape and expansion Heart: RRR, normal S1, S2, no murmurs Lungs: CTA bilaterally, no wheezes, rhonchi, or rales Abdomen: +bs, soft, non tender, non distended, no masses, no hepatomegaly, no splenomegaly, no bruits Back: non tender, normal ROM, no scoliosis Musculoskeletal: upper extremities non tender, no obvious deformity, normal ROM throughout, lower extremities non tender, no obvious deformity, normal ROM throughout Extremities: no edema, no cyanosis, no clubbing Pulses: 2+ symmetric, upper and lower extremities, normal cap refill Neurological: alert, oriented x 3, CN2-12 intact, strength normal upper extremities and lower extremities, sensation normal throughout, DTRs 2+ throughout, no cerebellar signs, gait normal Psychiatric: normal affect, behavior normal, pleasant  Breast/gyn/rectal - deferred  to gynecology   Assessment and Plan :    Encounter Diagnoses  Name Primary?  . Routine general medical examination at a health care facility Yes  . Essential hypertension, benign   . Obesity   . Hyperlipidemia   . Statin intolerance   . Shortness of breath   . COPD (chronic obstructive pulmonary disease)   . Snoring   . Sleep disturbance   . Non-restorative sleep   . GERD (gastroesophageal reflux disease)   . RLS (restless legs syndrome)    Physical exam - discussed healthy lifestyle, diet, exercise, preventative care, vaccinations, and addressed their concerns.  Handout given.  Advised multivitamin daily, VIt D daily, Aspirin 81mg  daily.    HTN - controlled on Lisinopril/HCT 10/12.5mg  daily.  Obesity - advised to work on diet and exercise changes to  help lose weight  Hyperlipidemia and statin intolerance - begin trial of Vytorin.   If this not tolerated, then try Welchol  SOB - reviewed EKG.  Will set up for in home sleep study.  COPD - c/t Symbicort, albuterol inhaler prn, refer for PFTs  Snoring, sleep disturbance, non restorative sleep - set up for in home sleep study  GERD- Nexium 40mg  BID.  RLS - begin trial of Requip.  Follow-up pending studies

## 2012-04-15 NOTE — Patient Instructions (Signed)

## 2012-04-17 ENCOUNTER — Telehealth: Payer: Self-pay | Admitting: Family Medicine

## 2012-04-17 ENCOUNTER — Telehealth: Payer: Self-pay | Admitting: Medical

## 2012-04-17 NOTE — Telephone Encounter (Signed)
Message copied by Janeice Robinson on Fri Apr 17, 2012 10:00 AM ------      Message from: Norwood, DAVID S      Created: Wed Apr 15, 2012  9:47 PM       Refer for in home sleep study, PFTs.

## 2012-04-17 NOTE — Telephone Encounter (Signed)
PATIENTS INFORMATION WAS SENT INTO [facility] TO HAVE A IN HOME SLEEP STUDY AND SHE IS ALSO AWARE OF HER PFT APPOINTMENT AT DR. Rolin [name] OFFICE. CLS

## 2012-04-17 NOTE — Telephone Encounter (Signed)
PATIENT HAS AN APPOINTMENT ON Friday 04-24-12 FOR A PFT WITH DR.WERT'S OFFICE. PATIENT IS AWARE OF HER APPOINTMENT. CLS

## 2012-04-21 ENCOUNTER — Telehealth: Payer: Self-pay | Admitting: Medical

## 2012-04-21 NOTE — Telephone Encounter (Signed)
Try prior auth as she has failed numerous statins

## 2012-04-21 NOTE — Telephone Encounter (Signed)
[employer] DENIED COVERAGE OF VYTORIN, SHANE DO YOU WANT TO SWITCH RX?

## 2012-04-22 NOTE — Telephone Encounter (Signed)
P.A. WAS DENIED SEE NOTE ON YOUR DESK

## 2012-04-23 NOTE — Telephone Encounter (Signed)
LM

## 2012-04-23 NOTE — Telephone Encounter (Signed)
Look into pharmquest study

## 2012-04-24 ENCOUNTER — Encounter (INDEPENDENT_AMBULATORY_CARE_PROVIDER_SITE_OTHER): Payer: BC Managed Care – PPO | Admitting: Internal Medicine

## 2012-04-24 DIAGNOSIS — R0602 Shortness of breath: Secondary | ICD-10-CM

## 2012-04-24 LAB — PULMONARY FUNCTION TEST

## 2012-04-24 NOTE — Telephone Encounter (Signed)
pls call Pharmquest study and see if they have any studies currently regarding statin intolerance.  She has failed numerous statins.  If they don't have a study she can qualify for, then my next step is to put her on Welchol.  Insurance denied Vytorin.  Pharmquest is [phone]

## 2012-04-27 ENCOUNTER — Other Ambulatory Visit: Payer: Self-pay | Admitting: Family Medicine

## 2012-04-27 ENCOUNTER — Telehealth: Payer: Self-pay | Admitting: Internal Medicine

## 2012-04-27 NOTE — Telephone Encounter (Signed)
Is this ok?

## 2012-04-27 NOTE — Telephone Encounter (Signed)
We talked about many things, but I can't recall our conversation about that.  How long has she been taking 150mg  vs 100mg ?  Last visit I added requip to help restless legs.  Is she taking this?

## 2012-04-27 NOTE — Telephone Encounter (Signed)
Pt called stating that she uped her zoloft to 150mg  and said it has been working and that she told you she was going up it. So she needs a refill sent in for zoloft 150mg  to [facility] on [address]

## 2012-04-28 ENCOUNTER — Other Ambulatory Visit: Payer: Self-pay | Admitting: Medical

## 2012-04-28 MED ORDER — SERTRALINE HCL 100 MG PO TABS: 150.0000 mg | ORAL_TABLET | Freq: Every day | ORAL | Status: DC

## 2012-04-28 NOTE — Telephone Encounter (Signed)
Patient states that she has been taking 150 mg of Zoloft since last month. She said that she is taking the Requip. She also states that she has taken her last Zoloft last night. CLS

## 2012-04-28 NOTE — Telephone Encounter (Signed)
I fax the [employer] paper work over for the statin study. CLS

## 2012-04-30 ENCOUNTER — Encounter: Payer: Self-pay | Admitting: Medical

## 2012-05-15 ENCOUNTER — Telehealth: Payer: Self-pay | Admitting: Family Medicine

## 2012-05-15 NOTE — Telephone Encounter (Signed)
PATIENT IS AWARE OF HER PFT AND SLEEP STUDY RESULTS. CLS

## 2012-05-15 NOTE — Telephone Encounter (Signed)
PATIENT IS AWARE OF HER PFT RESULTS AND SHE SAID SHE WILL FOLLOW UP AFTER HER CHOLESTEROL STUDY. CLS

## 2012-05-15 NOTE — Telephone Encounter (Signed)
Message copied by Janeice Robinson on Fri May 15, 2012 12:05 PM ------      Message from: Jac Canavan      Created: Tue May 05, 2012  7:31 AM       Spirometry shows a mild obstructive lung defect, thus, this would indicate that her COPD is not as bad as it seems.  Given her current use of symbicort and short of breath symptoms, lets see what her overnight sleep study shows too.  My understanding is that a different home health company is going to do this correct?  Do we have sleep study results?

## 2012-05-15 NOTE — Telephone Encounter (Signed)
Message copied by Janeice Robinson on Fri May 15, 2012 10:39 AM ------      Message from: Aleen Campi, DAVID S      Created: Thu May 14, 2012 11:41 AM       Sleep study only suggests mild sleep apnea.  Her lung studies showed mild COPD without significant response to inhaler.              How is her breathing currently?              We can either c/t current regimen, or consider other options such as pulmonary rehab, a different inhaler that works in a different manner such as spiriva for example.             If she would like to consider other treatment for breathing, then OV to discuss.

## 2012-06-15 ENCOUNTER — Telehealth: Payer: Self-pay | Admitting: Internal Medicine

## 2012-06-15 NOTE — Telephone Encounter (Signed)
Pt is going December 31st for endoscopy and colonoscopy and pt has a dry cough and wants something to looses up her cough and she is having some SOB. Send to [address] [address] and [employer]

## 2012-06-15 NOTE — Telephone Encounter (Signed)
Pt informed and said she would get OTC meds

## 2012-06-15 NOTE — Telephone Encounter (Signed)
She can take OTC cough suppressants but if she's having difficulty with shortness of breath, she will need to be seen

## 2012-06-24 HISTORY — PX: COLONOSCOPY: SHX174

## 2012-07-24 HISTORY — PX: UPPER GI ENDOSCOPY: SHX6162

## 2012-07-25 ENCOUNTER — Emergency Department (HOSPITAL_BASED_OUTPATIENT_CLINIC_OR_DEPARTMENT_OTHER)
Admission: EM | Admit: 2012-07-25 | Discharge: 2012-07-25 | Disposition: A | Discharge status: Home / Self Care | Payer: BC Managed Care – PPO | Attending: Emergency Medicine | Admitting: Emergency Medicine

## 2012-07-25 ENCOUNTER — Encounter (HOSPITAL_BASED_OUTPATIENT_CLINIC_OR_DEPARTMENT_OTHER): Payer: Self-pay | Admitting: *Deleted

## 2012-07-25 DIAGNOSIS — F3289 Other specified depressive episodes: Secondary | ICD-10-CM | POA: Insufficient documentation

## 2012-07-25 DIAGNOSIS — Y92410 Unspecified street and highway as the place of occurrence of the external cause: Secondary | ICD-10-CM | POA: Insufficient documentation

## 2012-07-25 DIAGNOSIS — F411 Generalized anxiety disorder: Secondary | ICD-10-CM | POA: Insufficient documentation

## 2012-07-25 DIAGNOSIS — Z8719 Personal history of other diseases of the digestive system: Secondary | ICD-10-CM | POA: Insufficient documentation

## 2012-07-25 DIAGNOSIS — F329 Major depressive disorder, single episode, unspecified: Secondary | ICD-10-CM | POA: Insufficient documentation

## 2012-07-25 DIAGNOSIS — J4489 Other specified chronic obstructive pulmonary disease: Secondary | ICD-10-CM | POA: Insufficient documentation

## 2012-07-25 DIAGNOSIS — Z8739 Personal history of other diseases of the musculoskeletal system and connective tissue: Secondary | ICD-10-CM | POA: Insufficient documentation

## 2012-07-25 DIAGNOSIS — K219 Gastro-esophageal reflux disease without esophagitis: Secondary | ICD-10-CM | POA: Insufficient documentation

## 2012-07-25 DIAGNOSIS — S39012A Strain of muscle, fascia and tendon of lower back, initial encounter: Secondary | ICD-10-CM

## 2012-07-25 DIAGNOSIS — Y939 Activity, unspecified: Secondary | ICD-10-CM | POA: Insufficient documentation

## 2012-07-25 DIAGNOSIS — E785 Hyperlipidemia, unspecified: Secondary | ICD-10-CM | POA: Insufficient documentation

## 2012-07-25 DIAGNOSIS — S335XXA Sprain of ligaments of lumbar spine, initial encounter: Secondary | ICD-10-CM | POA: Insufficient documentation

## 2012-07-25 DIAGNOSIS — Z79899 Other long term (current) drug therapy: Secondary | ICD-10-CM | POA: Insufficient documentation

## 2012-07-25 DIAGNOSIS — Z87891 Personal history of nicotine dependence: Secondary | ICD-10-CM | POA: Insufficient documentation

## 2012-07-25 DIAGNOSIS — Z8669 Personal history of other diseases of the nervous system and sense organs: Secondary | ICD-10-CM | POA: Insufficient documentation

## 2012-07-25 DIAGNOSIS — J449 Chronic obstructive pulmonary disease, unspecified: Secondary | ICD-10-CM | POA: Insufficient documentation
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
## 2012-07-27 ENCOUNTER — Other Ambulatory Visit: Payer: Self-pay | Admitting: Medical

## 2012-07-27 ENCOUNTER — Telehealth: Payer: Self-pay | Admitting: Medical

## 2012-07-27 MED ORDER — ROPINIROLE HCL 0.25 MG PO TABS: ORAL_TABLET | ORAL | Status: DC

## 2012-07-27 NOTE — Telephone Encounter (Signed)
Lets see her back regarding the requip, RLS symptoms.

## 2012-07-28 NOTE — Telephone Encounter (Signed)
Patient is aware that she needs to schedule a follow appointment. She states that she will call for an appointment for next week. CLS

## 2012-08-05 ENCOUNTER — Encounter: Payer: Self-pay | Admitting: Internal Medicine

## 2012-08-26 ENCOUNTER — Ambulatory Visit (INDEPENDENT_AMBULATORY_CARE_PROVIDER_SITE_OTHER): Payer: BC Managed Care – PPO | Admitting: Medical

## 2012-08-26 ENCOUNTER — Encounter: Payer: Self-pay | Admitting: Medical

## 2012-08-26 VITALS — BP 130/80 | HR 80 | Temp 98.1°F | Wt 183.0 lb

## 2012-08-26 DIAGNOSIS — Z789 Other specified health status: Secondary | ICD-10-CM

## 2012-08-26 DIAGNOSIS — Z888 Allergy status to other drugs, medicaments and biological substances status: Secondary | ICD-10-CM

## 2012-08-26 DIAGNOSIS — L299 Pruritus, unspecified: Secondary | ICD-10-CM

## 2012-08-26 DIAGNOSIS — G2581 Restless legs syndrome: Secondary | ICD-10-CM

## 2012-08-26 DIAGNOSIS — E669 Obesity, unspecified: Secondary | ICD-10-CM

## 2012-08-26 DIAGNOSIS — J449 Chronic obstructive pulmonary disease, unspecified: Secondary | ICD-10-CM

## 2012-08-26 DIAGNOSIS — N926 Irregular menstruation, unspecified: Secondary | ICD-10-CM

## 2012-08-26 DIAGNOSIS — E785 Hyperlipidemia, unspecified: Secondary | ICD-10-CM

## 2012-08-26 MED ORDER — TIOTROPIUM BROMIDE MONOHYDRATE 18 MCG IN CAPS: 18.0000 ug | ORAL_CAPSULE | Freq: Every day | RESPIRATORY_TRACT | Status: DC

## 2012-08-26 MED ORDER — ROPINIROLE HCL 0.25 MG PO TABS: ORAL_TABLET | ORAL | Status: DC

## 2012-08-26 NOTE — Progress Notes (Signed)
Subjective: Here for recheck on several items.    Since last visit she enrolled in [employer] study, had bee on several weeks of test drug and was ultimately removed from the study on a disqualifying point, she thinks it was something related to potentially being able to get pregnant but she is not sure.   She does have hx/o statin intolerance to all statins, but has never been on other lipid lowering medication.    Since last visit requip 0.25mg  QHS has been working well.  COPD - it has been 947mo since last albuterol use.  Using symbicort BID.   Feels like this doesn't work great, but she notices when she hasn't been taking it.  Mainly gets dyspnea cleaning bathrooms and when going up hills or doing something very strenuous.   She cleans houses for a living.    She had EGD and colonoscopy in 06/2012.  Repeat colonoscopy 10 years, but EGD in 40mo.    Lately been itching all over, not sure why.  No new exposures but she has cats, dogs, uses cleaning chemicals daily.  Taking allegra.  Having heavy periods, periods ever 2 weeks the past 3 months.    She is getting ready to see dietician at husbands' work.  She wants to get weight off.  Asks about diet pill today.  Objective Gen: wd, wn, nad  Neck: supple, no lymphadenopathy, no mass, no thyromegaly Heent: head nontender, TMs pearly, nares patent, pharynx normal  Lungs: decreased breath sounds, but no wheezes or rales, no rhonchi  Heart: RRR, normal S1, S2, no murmurs  Ext: no edema  Pulses normal Skin: scattered raised papular 2-87mm lesions on bilat upper legs and upper arms.  Some appear verrucal, some appear more chalky white  Assessment: Encounter Diagnoses  Name Primary?  . RLS (restless legs syndrome) Yes  . COPD (chronic obstructive pulmonary disease)   . Hyperlipidemia   . Statin intolerance   . Pruritic condition   . Menstrual irregularity   . Obesity, unspecified    Plan: RLS - much improved, c/t requip 0.25mg  QHS COPD - stop  Symbicort.  Change to Spiriva.  discussed risks/benefits of medication.  She is nonsmoking now.   Albuterol prn.   recheck 47mo Hyperlipidemia, statin intolerance - will consider options and get back with her Pruritic condition - c/t allegra, will see if improvement once she starts Spiriva.  Can call out steroid dosepak if worsening.  Consider recheck with derm for the other lesions.  Nothing worrisome for malignancy, but likely can receive treatment for the raised lesions present Menstrual irregularity - f/u with gyn Obesity - work on lifestyle changes, recheck in 47mo

## 2012-09-02 ENCOUNTER — Other Ambulatory Visit: Payer: Self-pay | Admitting: Medical

## 2012-09-02 ENCOUNTER — Telehealth: Payer: Self-pay | Admitting: Family Medicine

## 2012-09-02 MED ORDER — PHENTERMINE-TOPIRAMATE ER 7.5-46 MG PO CP24: 1.0000 | ORAL_CAPSULE | Freq: Every day | ORAL | Status: DC

## 2012-09-02 MED ORDER — PHENTERMINE-TOPIRAMATE ER 3.75-23 MG PO CP24: 1.0000 | ORAL_CAPSULE | Freq: Every day | ORAL | Status: DC

## 2012-09-02 NOTE — Telephone Encounter (Signed)
Patient called and said that her insurance would cover Qysmia. She said can you please send the prescription into the pharmacy. CLS

## 2012-09-02 NOTE — Telephone Encounter (Signed)
pls call in Qysmia as in chart.  There are 2 scripts to call out, the #14 lower dose, and the #30 higher dose.   She can come by for coupon card - check drug closet for this.  Recheck 42mo.    FYI - at recheck, add Zetia.

## 2012-09-03 ENCOUNTER — Telehealth: Payer: Self-pay | Admitting: Medical

## 2012-09-03 NOTE — Telephone Encounter (Signed)
I called the patients medication into the [facility] pharmacy. CLS

## 2012-09-03 NOTE — Telephone Encounter (Signed)
Prior authorization form obtained from [facility] for Qsymia SR 3.75-23mg  and sent to provider to be completed

## 2012-09-07 ENCOUNTER — Telehealth: Payer: Self-pay | Admitting: Medical

## 2012-09-07 NOTE — Telephone Encounter (Signed)
Qsymia prior auth. Form completed by Vincenza Hews and faxed to [facility]

## 2012-09-08 ENCOUNTER — Telehealth: Payer: Self-pay | Admitting: Medical

## 2012-09-08 NOTE — Telephone Encounter (Signed)
Prior authorization approved for Qsymia, authorization letter faxed to [facility]

## 2012-09-10 NOTE — Telephone Encounter (Signed)
Qsymia approved, approval faxed to pharmacy

## 2012-09-18 ENCOUNTER — Telehealth: Payer: Self-pay | Admitting: Medical

## 2012-09-18 MED ORDER — LISINOPRIL-HYDROCHLOROTHIAZIDE 10-12.5 MG PO TABS: 1.0000 | ORAL_TABLET | Freq: Every day | ORAL | Status: DC

## 2012-09-18 NOTE — Telephone Encounter (Signed)
When I saw her, I don't believe she had any obvious scabies skin sores, just the itching. Plus, we saw her for multiple issues which we addressed.   Sorry to here she has scabies. I did send the scabies cream to the pharmacy.

## 2012-09-18 NOTE — Telephone Encounter (Signed)
Medication was sent to her [facility] pharmacy. CLS

## 2012-09-18 NOTE — Telephone Encounter (Signed)
Please call she was seen by you for itching.  Ended up going to derm and was diagnosed with scabies. Now her husband, Shannon Mckenzie has it and she wants Rx for him Derm. Office closed. I told her that I would send message back to you but I didn't know since we have not seen him for this.  Please call

## 2012-09-21 NOTE — Telephone Encounter (Signed)
I would recommend a full month of Spiriva.  Taking it twice is not a realistic test.  Please have her c/t this daily for 30 days then f/u.

## 2012-09-21 NOTE — Telephone Encounter (Signed)
Patient states that she has tried the [place] twice and its not as good as the Symbicort so she will continue her Symbicort and not the Spriva. CLS   Patient is aware of the medication sent to the pharmacy. CLS

## 2012-09-22 NOTE — Telephone Encounter (Signed)
Patient states that she will try this for 30 days but she states that it is causing her to feel short of breath. CLS

## 2012-09-25 ENCOUNTER — Telehealth: Payer: Self-pay | Admitting: Medical

## 2012-09-25 NOTE — Telephone Encounter (Signed)
Have her go back to Symbicort and followup with Vincenza Hews later

## 2012-09-25 NOTE — Telephone Encounter (Signed)
PT INFORMED WORD FOR WORD PER JCL PT STATES SHE WILL CALL Monday TO MAKE APT

## 2012-10-02 ENCOUNTER — Ambulatory Visit: Payer: Self-pay | Admitting: Medical

## 2012-10-06 ENCOUNTER — Encounter: Payer: Self-pay | Admitting: Medical

## 2012-10-06 ENCOUNTER — Ambulatory Visit (INDEPENDENT_AMBULATORY_CARE_PROVIDER_SITE_OTHER): Payer: BC Managed Care – PPO | Admitting: Medical

## 2012-10-06 VITALS — BP 112/68 | HR 82 | Temp 98.0°F | Resp 16 | Wt 184.0 lb

## 2012-10-06 DIAGNOSIS — R0609 Other forms of dyspnea: Secondary | ICD-10-CM

## 2012-10-06 DIAGNOSIS — E669 Obesity, unspecified: Secondary | ICD-10-CM

## 2012-10-06 DIAGNOSIS — F341 Dysthymic disorder: Secondary | ICD-10-CM

## 2012-10-06 DIAGNOSIS — R0600 Dyspnea, unspecified: Secondary | ICD-10-CM

## 2012-10-06 DIAGNOSIS — F418 Other specified anxiety disorders: Secondary | ICD-10-CM

## 2012-10-06 DIAGNOSIS — J449 Chronic obstructive pulmonary disease, unspecified: Secondary | ICD-10-CM

## 2012-10-06 DIAGNOSIS — R0989 Other specified symptoms and signs involving the circulatory and respiratory systems: Secondary | ICD-10-CM

## 2012-10-06 DIAGNOSIS — L299 Pruritus, unspecified: Secondary | ICD-10-CM

## 2012-10-06 DIAGNOSIS — R0602 Shortness of breath: Secondary | ICD-10-CM

## 2012-10-06 MED ORDER — PHENTERMINE-TOPIRAMATE ER 7.5-46 MG PO CP24: 1.0000 | ORAL_CAPSULE | Freq: Every day | ORAL | Status: DC

## 2012-10-06 MED ORDER — SERTRALINE HCL 100 MG PO TABS: 150.0000 mg | ORAL_TABLET | Freq: Every day | ORAL | Status: DC

## 2012-10-06 NOTE — Progress Notes (Signed)
Subjective: Here for f/u.  I saw in March for multiple issues.    Here for recheck on COPD, Obesity, scabies.    COPD - per chart, diagnosed 2010.  Former smoker.  Quit 21 years ago.  Had smoked 1ppd x 15 years.  Had been on Symbicort prior to me taking over her care. Has raspy throat congestion on symbicort.  We changed to spiriva.  Not tolerating Spiriva, doesn't seem to help.  Has increased SOB and DOE.   No prior cardiology eval.  Has seasonal allergies, using Nasacort and Allegra, started back on this 2 wk ago.  Grandfather died of MI, mother had obesity and CHF.  She notes dryer vent sends the dust under the house, her HVAC has a hole and feels like dust blows in to house.  She thinks this is partially the cause of her SOB.  Obesity - after last visit she was agreeable to start Qsymia to help with weight loss efforts.  Felt mood change on Qsymia, tearful, depressed mood.   Started going back to counselor more frequently during this time.  On the other hand, feels that she was seeing some weight loss, decreased appetite.  She does want to continue this.  Exercising with walking - but breathing issues limits the walking to 3 days per week.   Trying to eat healthy.  Cut out restaurants.   Is using exercise bike.    Scabies - was seen in the ED for this.  Given cream, used this, and her rash has resolved.  But she does note 5-6cm crusting red rash on left breast x 4 days.  Wants this looked at.  Husband had same scabies rashes.    Past Medical History  Diagnosis Date  . GERD (gastroesophageal reflux disease)   . Allergy   . Hyperlipidemia   . Anxiety   . COPD (chronic obstructive pulmonary disease)   . Depression   . DJD (degenerative joint disease) of hip   . Former smoker   . Routine gynecological examination     sees gynecology  . Hiatal hernia   . Vision problems     wears contact lenses   ROS as in subjective:  Objective: Filed Vitals:   10/06/12 1352  BP: 112/68  Pulse: 82   Temp: 98 F (36.7 C)  Resp: 16    General appearance: alert, no distress, WD/WN Skin: no obvious ulcerations, erythematous of scaly lesions, left lateral aerola with whealed appearing lesion with erythema.  No warmth, induration, fluctuance. Exam chaperoned by NP student Darin Engels. HEENT: normocephalic, sclerae anicteric, TMs pearly, nares patent, no discharge or erythema, pharynx normal Oral cavity: MMM, no lesions Neck: supple, no lymphadenopathy, no thyromegaly, no masses Heart: RRR, normal S1, S2, no murmurs Lungs: CTA bilaterally, no wheezes, rhonchi, or rales Abdomen: +bs, soft, non tender, non distended, no masses, no hepatomegaly, no splenomegaly Pulses: 2+ symmetric, upper and lower extremities, normal cap refill Ext: no edema, cyanosis   Assessment: Encounter Diagnoses  Name Primary?  . SOB (shortness of breath) Yes  . DOE (dyspnea on exertion)   . COPD (chronic obstructive pulmonary disease)   . Obesity, unspecified   . Pruritic condition   . Depression with anxiety    Plan: SOB, DOE, COPD - underlying mild COPD per PFTs in 04/2012.  No major improvements with Symbicort or Spiriva.   Refer for 1st baseline screening/eval with cardiology and to help rule out cardiac source.  C/t Symbicort for now since it gave  some improvement.     Obesity - c/t Qsymia, healthy diet, walking, and c/t efforts to lose more weight.  Recheck 84mo.    Pruritis - go ahead and use one more round of Permethrin.  If not resolved in 2wk, recheck with dermatology.  Begin hydrocortisone cream for right breast/nipple rash which appears to be insect bite.  Depression with anxiety - c/t counseling, c/t same medication  F/u in 84mo

## 2012-11-17 ENCOUNTER — Telehealth: Payer: Self-pay | Admitting: Medical

## 2012-11-18 ENCOUNTER — Other Ambulatory Visit: Payer: Self-pay | Admitting: Medical

## 2012-11-18 MED ORDER — PHENTERMINE-TOPIRAMATE ER 7.5-46 MG PO CP24: 1.0000 | ORAL_CAPSULE | Freq: Every day | ORAL | Status: DC

## 2012-11-18 NOTE — Progress Notes (Signed)
I called out Qysmia to the pharmacy per Crosby Oyster PA-C. CLS

## 2012-11-23 NOTE — Telephone Encounter (Signed)
Done

## 2013-01-20 ENCOUNTER — Other Ambulatory Visit: Payer: Self-pay | Admitting: Medical

## 2013-01-20 ENCOUNTER — Telehealth: Payer: Self-pay | Admitting: Medical

## 2013-01-20 ENCOUNTER — Other Ambulatory Visit: Payer: Self-pay | Admitting: Internal Medicine

## 2013-01-20 DIAGNOSIS — R0602 Shortness of breath: Secondary | ICD-10-CM

## 2013-01-20 MED ORDER — BUDESONIDE-FORMOTEROL FUMARATE 160-4.5 MCG/ACT IN AERO: 2.0000 | INHALATION_SPRAY | Freq: Two times a day (BID) | RESPIRATORY_TRACT | Status: DC

## 2013-01-20 NOTE — Telephone Encounter (Signed)
PT NEEDS REFILL ON SYMBICORT. SHE STATES SHE HASN'T HAD THIS WRITTEN IN A WHILE DUE TO THE FACT SHE DIDN'T HAVE INSURANCE.SHE STATES SHE WAS RECEIVING HER MEDS THRU PT ASSISTANCE. PT IS LEAVING TO GO ON VACATION AND WOULD LIKE TO HAVE IT BEFORE SHE GOES. PT STATES SHE WOULD LIKE A CALL BACK TO INFORM WHEN READY. SHE ALSO STATES THAT IF WE HAVE A SAMPLE THAT WOULD BE GREAT. PLEASE CALL PT AT [phone].

## 2013-01-20 NOTE — Telephone Encounter (Signed)
Did we miss the referral to cardiology.  I asked for referral last visit

## 2013-01-20 NOTE — Telephone Encounter (Signed)
Pt was scheduled for tomorrow with Dr. [name] but pt said she is going out of town so she will call Dr. [name] office when she gets back to schedule appt Dr. [name]. # [phone] and i will go ahead and fax over ov notes [phone]

## 2013-01-20 NOTE — Telephone Encounter (Signed)
After last visit I referred for baseline cardiology eval since she really didn't have significant improvement with symbicort or spiriva?   Where is the cardiology notes?  Did she see cardiology?  please inquire?  Does she feel like symbicort is actually helping?

## 2013-01-20 NOTE — Telephone Encounter (Signed)
Called pt back reached voice mail.  Did advise sample of Symbicort up front.  Need to discuss referral to cardiologist, rechk 1 month visit and rx.

## 2013-01-20 NOTE — Telephone Encounter (Signed)
Pt notified that rx was sent

## 2013-01-20 NOTE — Telephone Encounter (Signed)
pls refer to cardiology for baseline eval for SOB.   Has PFTs showing mild COPD but want to rule out cardiac source of SOB.

## 2013-01-20 NOTE — Telephone Encounter (Signed)
Pt called and spoke with [name] up front she is upset that no cardiology referral as of yet.  I will investigate and advise patient.

## 2013-02-22 DIAGNOSIS — R0602 Shortness of breath: Secondary | ICD-10-CM

## 2013-02-22 HISTORY — DX: Shortness of breath: R06.02

## 2013-03-01 ENCOUNTER — Encounter: Payer: Self-pay | Admitting: Cardiology

## 2013-03-01 ENCOUNTER — Other Ambulatory Visit: Payer: Self-pay | Admitting: Cardiology

## 2013-03-01 ENCOUNTER — Ambulatory Visit
Admission: RE | Admit: 2013-03-01 | Discharge: 2013-03-01 | Disposition: A | Discharge status: Home / Self Care | Payer: BC Managed Care – PPO | Source: Ambulatory Visit | Attending: Cardiology | Admitting: Cardiology

## 2013-03-01 DIAGNOSIS — R0609 Other forms of dyspnea: Secondary | ICD-10-CM

## 2013-03-01 DIAGNOSIS — R0989 Other specified symptoms and signs involving the circulatory and respiratory systems: Secondary | ICD-10-CM

## 2013-03-01 NOTE — Progress Notes (Signed)
Patient ID: Vaidehi Braddy, female   DOB: 1960-12-17, 52 y.o.   MRN: [id number]  [place], [place]    Date of visit:  03/01/2013 DOB:  June 30, 1960    Age:  51 yrs. Medical record number:  [id number]     Account number:  [id number] Primary Care Provider: Yvetta Coder ____________________________ CURRENT DIAGNOSES  1. Dyspnea  2. COPD  3. GERD  4. Hyperlipidemia  5. Obesity(BMI30-40) ____________________________ ALLERGIES  Amoxicillin, Rash  Atorvastatin, Muscle aches  Hmg-Coa Reductase Inhibitors, Muscle aches  Penicillins, Rash, itching ____________________________ MEDICATIONS  1. sertraline 100 mg tablet, 1.5 p.o. daily  2. lisinopril-hydrochlorothiazide 10-12.5 mg tablet, 1 p.o. daily  3. Nexium 40 mg capsule,delayed release(DR/EC), BID  4. Symbicort 160-4.5 mcg/actuation HFA aerosol inhaler, 2 puff BID  5. Nasacort 55 mcg aerosol,spray, 2 squirts each nostril daily  6. Allegra-D 24 Hour 180-240 mg tablet extended release 24 hr, 1 p.o. daily  7. Advil 200 mg tablet, PRN ____________________________ CHIEF COMPLAINTS  Dyspnea  with exertion ____________________________ HISTORY OF PRESENT ILLNESS  Patient seen at the request of Kristian Covey for evaluation of dyspnea. The patient has a remote history of smoking and has a known history of COPD, mild hypertension and mild obesity. She is able to work cleaning houses denies dyspnea walking up stairs or with moderate activity. She can walk on flat ground without dyspnea. She has a prior history of hyperlipidemia but has had intolerance to multiple statin agents and cannot take anything. She denies angina. She has not had any recent lab work or chest x-ray. She does have a mild dry cough. She denies PND, orthopnea or claudication. She has occasional venous insufficiency noted. ____________________________ PAST HISTORY  Past Medical Illnesses:  anxiety, COPD, GERD, hyperlipidemia, hiatal hernia, obesity;  Cardiovascular Illnesses:  no  previous history of cardiac disease.;  Surgical Procedures:  no prior surgical procedures;  Cardiology Procedures-Invasive:  no history of prior cardiac procedures;  Cardiology Procedures-Noninvasive:  no previous non-invasive procedures;  LVEF not documented,   ____________________________ CARDIO-PULMONARY TEST DATES EKG Date:  03/01/2013;   ____________________________ FAMILY HISTORY Brother -- Brother alive and well Father -- Carcinoma of the pancreas, Deceased Mother -- Liver disease, Type 2 Diabetes, Congestive heart failure, Deceased Sister -- Sister alive and well Sister -- Sister alive and well ____________________________ SOCIAL HISTORY Alcohol Use:  no alcohol use;  Smoking:  used to smoke but quit;  Diet:  regular diet;  Lifestyle:  married and 1 son;  Exercise:  no regular exercise;  Occupation:  cleans houses;  Residence:  lives with husband and children;   ____________________________ REVIEW OF SYSTEMS General:  obesity  Integumentary:no rashes or new skin lesions. Eyes: wears eye glasses/contact lenses, denies diplopia, glaucoma or visual field defects. Ears, Nose, Throat, Mouth:  denies any hearing loss, epistaxis, hoarseness or difficulty speaking. Respiratory: dyspnea with exertion Cardiovascular:  please review HPI Abdominal: dyspepsia and waterbrashGenitourinary-Female: no dysuria, urgency, frequency, UTIs, or stress incontinence Musculoskeletal:  chronic low back pain Neurological:  denies headaches, stroke, or TIA,. Psychiatric:  anxiety Hematological/Immunologic:  denies any food allergies, bleeding disorders. ____________________________ PHYSICAL EXAMINATION VITAL SIGNS  Blood Pressure:  114/70 Sitting, Left arm, regular cuff   Pulse:  78/min. Weight:  181.00 lbs. Height:  62"BMI: 33  Constitutional:  pleasant white female, in no acute distress, mildly obese Skin:  scattered papular lesions over trunk and extremities Head:  normocephalic, normal hair pattern, no  masses or tenderness Eyes:  EOMS Intact, PERRLA, C and S clear, Funduscopic exam not  done. ENT:  ears, nose and throat reveal no gross abnormalities.  Dentition good. Neck:  supple, without massess. No JVD, thyromegaly or carotid bruits. Carotid upstroke normal. Chest:  normal symmetry, clear to auscultation and percussion. Cardiac:  regular rhythm, normal S1 and S2, No S3 or S4, no murmurs, gallops or rubs detected. Abdomen:  abdomen soft,non-tender, no masses, no hepatospenomegaly, or aneurysm noted Peripheral Pulses:  the femoral,dorsalis pedis, and posterior tibial pulses are full and equal bilaterally with no bruits auscultated. Extremities & Back:  bilateral venous insufficiency changes present Neurological:  no gross motor or sensory deficits noted, affect appropriate, oriented x3. ____________________________ MOST RECENT LIPID PANEL 04/06/12  CHOL TOTL 248 mg/dl, LDL 161 calc, HDL 57 mg/dl, TRIGLYCER 80 mg/dl and CHOL/HDL 4.4 (Calc) ____________________________ IMPRESSIONS/PLAN  1. Dyspnea of uncertain etiology 2. Hypertension controlled 3. Hyperlipidemia with statin intolerance 4. Obesity with need to lose weight  Recommendations:  Her EKG is normal today. I recommended that she have an echocardiogram to assess ventricular function, a treadmill test to evaluate exercise capacity and ischemia and that she have lab work and an x-ray as noted below since it has been close to a year. Thank you for asking me to see her with you. ____________________________ TODAYS ORDERS  1. 12 Lead EKG: Today  2. 2D, color flow, doppler: [facility]  3. treadmill:  Regular TM At [facility]  4. Chest X-ray PA/Lat: today  5. Comprehensive Metabolic Panel: Today  6. Complete Blood Count: Today  7. BNP: Today  8. TSH: Today                       ____________________________ Cardiology Physician:  Darden Palmer MD [facility]

## 2013-03-02 ENCOUNTER — Ambulatory Visit (INDEPENDENT_AMBULATORY_CARE_PROVIDER_SITE_OTHER): Payer: BC Managed Care – PPO | Admitting: Family Medicine

## 2013-03-02 ENCOUNTER — Encounter: Payer: Self-pay | Admitting: Family Medicine

## 2013-03-02 VITALS — BP 120/80 | HR 78 | Wt 177.0 lb

## 2013-03-02 DIAGNOSIS — G2581 Restless legs syndrome: Secondary | ICD-10-CM

## 2013-03-02 DIAGNOSIS — J019 Acute sinusitis, unspecified: Secondary | ICD-10-CM

## 2013-03-02 DIAGNOSIS — J209 Acute bronchitis, unspecified: Secondary | ICD-10-CM

## 2013-03-02 MED ORDER — CLARITHROMYCIN 500 MG PO TABS: 500.0000 mg | ORAL_TABLET | Freq: Two times a day (BID) | ORAL | Status: DC

## 2013-03-02 MED ORDER — ROPINIROLE HCL 0.25 MG PO TABS: ORAL_TABLET | ORAL | Status: DC

## 2013-03-02 NOTE — Progress Notes (Signed)
  Subjective:    Patient ID: Shannon Mckenzie, female    DOB: 18-Jan-1961, 52 y.o.   MRN: [id number]  HPI She has a two-week history of slight cough followed by sinus congestion, PND. She has had clear nasal drainage however she has had worsening cough in the last couple days that is intermittently productive. He is also had difficulty with fever and chills. She does not smoke. She continues on Symbicort for her COPD. She also needs a refill on her Requip that she takes for RLS. She gets good results with this.  Review of Systems     Objective:   Physical Exam alert and in no distress. Tympanic membranes and canals are normal. Throat is clear. Tonsils are normal. Neck is supple without adenopathy or thyromegaly. Cardiac exam shows a regular sinus rhythm without murmurs or gallops. Lungs are clear to auscultation. Nasal mucosa is normal. Tender over ethmoid and maxillary sinuses.        Assessment & Plan:  Acute sinusitis - Plan: clarithromycin (BIAXIN) 500 MG tablet  Acute bronchitis - Plan: clarithromycin (BIAXIN) 500 MG tablet  RLS (restless legs syndrome) - Plan: rOPINIRole (REQUIP) 0.25 MG tablet  she is to call if not entirely better.

## 2013-03-02 NOTE — Patient Instructions (Signed)
Take all the medicine and if not only back to normal when you finish with me a call. Have fun in [place]

## 2013-03-11 ENCOUNTER — Other Ambulatory Visit: Payer: Self-pay | Admitting: Cardiology

## 2013-03-11 ENCOUNTER — Ambulatory Visit
Admission: RE | Admit: 2013-03-11 | Discharge: 2013-03-11 | Disposition: A | Discharge status: Home / Self Care | Payer: BC Managed Care – PPO | Source: Ambulatory Visit | Attending: Cardiology | Admitting: Cardiology

## 2013-03-11 DIAGNOSIS — R7989 Other specified abnormal findings of blood chemistry: Secondary | ICD-10-CM

## 2013-03-11 MED ORDER — IOHEXOL 350 MG/ML SOLN
100.0000 mL | Freq: Once | INTRAVENOUS | Status: AC | PRN
  Administered 2013-03-11: 100 mL via INTRAVENOUS

## 2013-03-11 MED ORDER — IOHEXOL 350 MG/ML SOLN: 125.0000 mL | Freq: Once | INTRAVENOUS | Status: DC | PRN

## 2013-03-13 ENCOUNTER — Other Ambulatory Visit: Payer: Self-pay | Admitting: Medical

## 2013-03-15 ENCOUNTER — Other Ambulatory Visit: Payer: Self-pay

## 2013-03-15 ENCOUNTER — Encounter: Payer: Self-pay | Admitting: Medical

## 2013-03-15 MED ORDER — BUDESONIDE-FORMOTEROL FUMARATE 160-4.5 MCG/ACT IN AERO: 2.0000 | INHALATION_SPRAY | Freq: Two times a day (BID) | RESPIRATORY_TRACT | Status: DC

## 2013-03-15 NOTE — Telephone Encounter (Signed)
SENT IN [facility]

## 2013-03-19 ENCOUNTER — Encounter: Payer: Self-pay | Admitting: Internal Medicine

## 2013-03-19 ENCOUNTER — Ambulatory Visit (INDEPENDENT_AMBULATORY_CARE_PROVIDER_SITE_OTHER): Payer: BC Managed Care – PPO | Admitting: Internal Medicine

## 2013-03-19 VITALS — BP 112/84 | HR 66 | Temp 98.1°F | Ht 62.0 in | Wt 187.0 lb

## 2013-03-19 DIAGNOSIS — R058 Other specified cough: Secondary | ICD-10-CM | POA: Insufficient documentation

## 2013-03-19 DIAGNOSIS — R059 Cough, unspecified: Secondary | ICD-10-CM

## 2013-03-19 DIAGNOSIS — Z23 Encounter for immunization: Secondary | ICD-10-CM

## 2013-03-19 DIAGNOSIS — J4489 Other specified chronic obstructive pulmonary disease: Secondary | ICD-10-CM

## 2013-03-19 DIAGNOSIS — J449 Chronic obstructive pulmonary disease, unspecified: Secondary | ICD-10-CM

## 2013-03-19 DIAGNOSIS — R05 Cough: Secondary | ICD-10-CM | POA: Insufficient documentation

## 2013-03-19 MED ORDER — TRAMADOL HCL 50 MG PO TABS: ORAL_TABLET | ORAL | Status: DC

## 2013-03-19 MED ORDER — DOXYCYCLINE HYCLATE 100 MG PO TABS: 100.0000 mg | ORAL_TABLET | Freq: Two times a day (BID) | ORAL | Status: DC

## 2013-03-19 NOTE — Progress Notes (Signed)
Subjective:    Patient ID: Shannon Mckenzie, female    DOB: August 28, 1960 MRN: [id number]  HPI  26 yowf smoker quit smoking 1993 no resp problems but within a year started having a cough sev times dx as treated with abx and sometimes prednisone but never persistent  at all then suddenly at work around 2007 ? Exposure > dx as copd and treated with symbicort which seemed to helped until in 2013 while on ACEi and tried different inhalers didn't work and went back to [employer] and then developed cough around summer 2014 and eval by [name] for Sob > neg > referred to pulmonary clinic 03/19/2013    03/19/2013 [facility]st [facility] office visit/ [name] cc daily x 2 months x cough > yellow mucus off and on all day assoc with  Some nasal congestion worse at hs but does not wake her up with doe x steps and much better p ventolin. Typically does ok on flat surfaces.  No obvious day to day or daytime variabilty or assoc chronic cough or cp or chest tightness, subjective wheeze.. No unusual exp hx or h/o childhood pna/ asthma or knowledge of premature birth.  Sleeping ok without nocturnal  or early am exacerbation  of respiratory  c/o's or need for noct saba. Also denies any obvious fluctuation of symptoms with weather or environmental changes or other aggravating or alleviating factors except as outlined above   Current Medications, Allergies, Complete Past Medical History, Past Surgical History, Family History, and Social History were reviewed in [name] record.           Review of Systems  Constitutional: Negative for fever, chills and unexpected weight change.  HENT: Positive for congestion and sneezing. Negative for ear pain, nosebleeds, sore throat, rhinorrhea, trouble swallowing, dental problem, voice change, postnasal drip and sinus pressure.   Eyes: Negative for visual disturbance.  Respiratory: Positive for cough and shortness of breath. Negative for choking.    Cardiovascular: Negative for chest pain and leg swelling.  Gastrointestinal: Negative for vomiting, abdominal pain and diarrhea.  Genitourinary: Negative for difficulty urinating.       Heartburn Indigestion  Musculoskeletal: Positive for arthralgias.  Skin: Negative for rash.  Neurological: Negative for tremors, syncope and headaches.  Hematological: Does not bruise/bleed easily.       Objective:   Physical Exam  amb wf nad Wt Readings from Last 3 Encounters:  03/19/13 187 lb (84.823 kg)  03/02/13 177 lb (80.287 kg)  10/06/12 184 lb (83.462 kg)     HEENT: nl dentition, turbinates, and orophanx. Nl external ear canals without cough reflex   NECK :  without JVD/Nodes/TM/ nl carotid upstrokes bilaterally   LUNGS: no acc muscle use, clear to A and P bilaterally without cough on insp or exp maneuvers   CV:  RRR  no s3 or murmur or increase in P2, no edema   ABD:  soft and nontender with nl excursion in the supine position. No bruits or organomegaly, bowel sounds nl  MS:  warm without deformities, calf tenderness, cyanosis or clubbing  SKIN: warm and dry without lesions    NEURO:  alert, approp, no deficits    CTa 03/11/13  Negative for pulmonary embolism.  Scattered small nodules in both lungs. Small cluster of nodules in  the medial left lower lobe probably represents an infectious or  inflammatory process of unknown age. The other small pulmonary  nodules are nonspecific       Assessment & Plan:

## 2013-03-19 NOTE — Patient Instructions (Addendum)
Doxycycline 100 mg twice daily before you eat with glass of water x 10 days > if mucus is not clear you need a sinus ct [name] at [phone])  Work on inhaler technique:  relax and gently blow all the way out then take a nice smooth deep breath back in, triggering the inhaler at same time you start breathing in.  Hold for up to 5 seconds if you can.  Rinse and gargle with water when done  mucinex dm up 1200mg  every  12 hours as needed cough - if not effective fill the rx for tramadol to take up to every 4 hours  Nexium 40 mg  Take 30- 60 min before your first and last meals of the day   GERD (REFLUX)  is an extremely common cause of respiratory symptoms, many times with no significant heartburn at all.    It can be treated with medication, but also with lifestyle changes including avoidance of late meals, excessive alcohol, smoking cessation, and avoid fatty foods, chocolate, peppermint, colas, red wine, and acidic juices such as orange juice.  NO MINT OR MENTHOL PRODUCTS SO NO COUGH DROPS  USE SUGARLESS CANDY INSTEAD (jolley ranchers or Stover's)  NO OIL BASED VITAMINS - use powdered substitutes.  Please schedule a follow up office visit in 4 weeks, sooner if needed with pft's

## 2013-03-20 DIAGNOSIS — J4530 Mild persistent asthma, uncomplicated: Secondary | ICD-10-CM | POA: Insufficient documentation

## 2013-03-20 NOTE — Assessment & Plan Note (Signed)
Probably relatively mild sp remote smoking cessation  The proper method of use, as well as anticipated side effects, of a metered-dose inhaler are discussed and demonstrated to the patient. Improved effectiveness after extensive coaching during this visit to a level of approximately  75% so continue symbicort and prn saba only for now

## 2013-03-20 NOTE — Assessment & Plan Note (Signed)
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   These are the same pts (now being labeled as having "irritable larynx syndrome" by some cough centers) who not infrequently have a history of having failed to tolerate ace inhibitors,  dry powder inhalers or biphosphonates or report having atypical reflux symptoms that don't respond to standard doses of PPI , and are easily confused as having aecopd or asthma flares by even experienced allergists/ pulmonologists.   Agree with leaving off ace and only use hfa symbicort /prn saba hfa for sob, while max rx for gerd / rhinitis and next step sinus ct if not improving  See instructions for specific recommendations which were reviewed directly with the patient who was given a copy with highlighter outlining the key components.

## 2013-04-09 ENCOUNTER — Telehealth: Payer: Self-pay | Admitting: Internal Medicine

## 2013-04-09 DIAGNOSIS — R0981 Nasal congestion: Secondary | ICD-10-CM

## 2013-04-09 NOTE — Telephone Encounter (Signed)
Per MW on last ov with pt on 03/19/13:   Doxycycline 100 mg twice daily before you eat with glass of water x 10 days > if mucus is not clear you need a sinus ct [name] at [phone])  Pt stated that she has cleared all the mucus out of her chest but it is all in her head now.  She is using the nasal saline and this just seems to not help.  When she lays down at night the head congestion drains into her chest and makes her cough.  Will order CT sinus per MW last ov note.  Pt is aware.

## 2013-04-14 ENCOUNTER — Other Ambulatory Visit: Payer: BC Managed Care – PPO

## 2013-04-16 ENCOUNTER — Ambulatory Visit (INDEPENDENT_AMBULATORY_CARE_PROVIDER_SITE_OTHER): Payer: BC Managed Care – PPO | Admitting: Internal Medicine

## 2013-04-16 ENCOUNTER — Other Ambulatory Visit (INDEPENDENT_AMBULATORY_CARE_PROVIDER_SITE_OTHER): Payer: BC Managed Care – PPO

## 2013-04-16 ENCOUNTER — Encounter: Payer: Self-pay | Admitting: Internal Medicine

## 2013-04-16 ENCOUNTER — Ambulatory Visit (INDEPENDENT_AMBULATORY_CARE_PROVIDER_SITE_OTHER)
Admission: RE | Admit: 2013-04-16 | Discharge: 2013-04-16 | Disposition: A | Discharge status: Home / Self Care | Payer: BC Managed Care – PPO | Source: Ambulatory Visit | Attending: Internal Medicine | Admitting: Internal Medicine

## 2013-04-16 VITALS — BP 140/82 | HR 60 | Temp 97.4°F | Ht 62.0 in | Wt 179.0 lb

## 2013-04-16 DIAGNOSIS — R0981 Nasal congestion: Secondary | ICD-10-CM

## 2013-04-16 DIAGNOSIS — R05 Cough: Secondary | ICD-10-CM

## 2013-04-16 DIAGNOSIS — R059 Cough, unspecified: Secondary | ICD-10-CM

## 2013-04-16 DIAGNOSIS — D649 Anemia, unspecified: Secondary | ICD-10-CM | POA: Insufficient documentation

## 2013-04-16 DIAGNOSIS — J3489 Other specified disorders of nose and nasal sinuses: Secondary | ICD-10-CM

## 2013-04-16 DIAGNOSIS — J45909 Unspecified asthma, uncomplicated: Secondary | ICD-10-CM

## 2013-04-16 LAB — CBC WITH DIFFERENTIAL/PLATELET
Basophils Absolute: 0.1 10*3/uL (ref 0.0–0.1)
Eosinophils Absolute: 0.8 10*3/uL — ABNORMAL HIGH (ref 0.0–0.7)
HCT: 35.7 % — ABNORMAL LOW (ref 36.0–46.0)
Hemoglobin: 12.1 g/dL (ref 12.0–15.0)
Lymphocytes Relative: 29.2 % (ref 12.0–46.0)
MCHC: 33.8 g/dL (ref 30.0–36.0)
MCV: 77.8 fl — ABNORMAL LOW (ref 78.0–100.0)
Monocytes Absolute: 0.5 10*3/uL (ref 0.1–1.0)
Neutro Abs: 2.8 10*3/uL (ref 1.4–7.7)
Neutrophils Relative %: 47.4 % (ref 43.0–77.0)
Platelets: 214 10*3/uL (ref 150.0–400.0)
RDW: 14.9 % — ABNORMAL HIGH (ref 11.5–14.6)

## 2013-04-16 LAB — PULMONARY FUNCTION TEST

## 2013-04-16 MED ORDER — CEFDINIR 300 MG PO CAPS: 300.0000 mg | ORAL_CAPSULE | Freq: Two times a day (BID) | ORAL | Status: DC

## 2013-04-16 MED ORDER — BUDESONIDE-FORMOTEROL FUMARATE 160-4.5 MCG/ACT IN AERO: 2.0000 | INHALATION_SPRAY | Freq: Two times a day (BID) | RESPIRATORY_TRACT | Status: DC

## 2013-04-16 NOTE — Progress Notes (Signed)
PFT done. Jennifer Castillo, CMA  

## 2013-04-16 NOTE — Progress Notes (Signed)
Subjective:    Patient ID: Shannon Mckenzie, female    DOB: 1960-09-27    MRN: [id number]    Brief patient profile:  51 yowf   quit smoking 1993 no resp problems but within a year started having a cough sev times a year treated with abx and sometimes prednisone but never persistent  at all then suddenly at work around 2007 ? Exposure > dx as copd and treated with symbicort which seemed to help  until   2013 while on ACEi and tried different inhalers didn't work and went back to [employer] and then developed cough around summer 2014 and eval by [name] for Sob > neg > referred to pulmonary clinic 03/19/2013   History of Present Illness  03/19/2013 [facility] office visit/ Wert cc daily x 2 months x cough > yellow mucus off and on all day assoc with  Some nasal congestion worse at hs but does not wake her up with doe x steps and much better p ventolin. Typically does ok on flat surfaces. rec Doxycycline 100 mg twice daily before you eat with glass of water x 10 days > if mucus is not clear you need a sinus ct [name] at [phone]) Continue symbicort but Work on inhaler technique:  Mucinex dm up 1200mg  every  12 hours as needed cough - if not effective fill the rx for tramadol to take up to every 4 hours Nexium 40 mg  Take 30- 60 min before your first and last meals of the day  GERD diet  04/16/2013 f/u ov/Wert re: asthma Chief Complaint  Patient presents with  . Follow-up    Pt states that her breathing has improved since the last visit. Cough has improved- no longer coughing during ther day, but still bothers her at night.    no sign limiting sob, no need for saba, still some green mucus in ams.  No obvious day to day or daytime variabilty or assoc chronic cough or cp or chest tightness, subjective wheeze.. No unusual exp hx or h/o childhood pna/ asthma or knowledge of premature birth.  Sleeping ok without nocturnal  or early am exacerbation  of respiratory  c/o's or need for noct  saba. Also denies any obvious fluctuation of symptoms with weather or environmental changes or other aggravating or alleviating factors except as outlined above   Current Medications, Allergies, Complete Past Medical History, Past Surgical History, Family History, and Social History were reviewed in [name] record.                Objective:   Physical Exam  amb wf nad  04/16/13          179  Wt Readings from Last 3 Encounters:  03/19/13 187 lb (84.823 kg)  03/02/13 177 lb (80.287 kg)  10/06/12 184 lb (83.462 kg)     HEENT: nl dentition, turbinates, and orophanx. Nl external ear canals without cough reflex   NECK :  without JVD/Nodes/TM/ nl carotid upstrokes bilaterally   LUNGS: no acc muscle use, clear to A and P bilaterally without cough on insp or exp maneuvers   CV:  RRR  no s3 or murmur or increase in P2, no edema   ABD:  soft and nontender with nl excursion in the supine position. No bruits or organomegaly, bowel sounds nl  MS:  warm without deformities, calf tenderness, cyanosis or clubbing          CTa 03/11/13  Negative for pulmonary embolism.  Scattered small nodules in both lungs. Small cluster of nodules in  the medial left lower lobe probably represents an infectious or  inflammatory process of unknown age. The other small pulmonary  nodules are nonspecific  Sinus CT 04/16/2013 Mild mucosal thickening frontal sinuses bilaterally.  Prominent opacification ethmoid sinus air cells bilaterally.  Prominent opacification maxillary sinuses bilaterally with air-fluid  level on the right suggesting acute sinusitis.  Mild mucosal thickening anterior aspect of the sphenoid sinus air  cells.        Assessment & Plan:

## 2013-04-16 NOTE — Patient Instructions (Addendum)
omnicef 300 twice daily x 14 days and call if not 100% better  Ok to use symbicort 160 Take 2 puffs first thing in am and then another 2 puffs about 12 hours later.   Please remember to go to the lab   department downstairs for your tests - we will call you with the results when they are available.  Please schedule a follow up office visit in 4 weeks, sooner if needed

## 2013-04-17 NOTE — Assessment & Plan Note (Addendum)
-   ACEi d/c around 03/03/13  - Sinus CT 04/16/2013 > Mild mucosal thickening frontal sinuses bilaterally.  Prominent opacification ethmoid sinus air cells bilaterally.  Prominent opacification maxillary sinuses bilaterally with air-fluid  level on the right suggesting acute sinusitis.  Mild mucosal thickening anterior aspect of the sphenoid sinus air  Cells. - Allergy profile 04/16/2013 > Eos 14.4% and IgE= 74 >>  Needs 14 to 28 days of abx to see if can irradicate sinusitis - pcn allergy just a limited rash so ok to use omnicef though cautions given Continue nasacort

## 2013-04-17 NOTE — Assessment & Plan Note (Signed)
-   hfa 75% p coaching 03/19/13  - PFT's 04/16/13 airflow normalizes p B2 (a 15% improvement from baseline) with nl DLCO  All goals of chronic asthma control met including optimal function and elimination of symptoms with minimal need for rescue therapy.  Contingencies discussed in full including contacting this office immediately if not controlling the symptoms using the rule of two's.

## 2013-04-19 ENCOUNTER — Encounter: Payer: Self-pay | Admitting: Internal Medicine

## 2013-04-19 LAB — ALLERGY PROFILE REGION II-DC, DE, MD, ~~LOC~~, VA
Alternaria Alternata: <0.1 kU/L
Aspergillus fumigatus, m3: <0.1 kU/L
Box Elder IgE: <0.1 kU/L
Cat Dander: 8.24 kU/L — ABNORMAL HIGH
Cladosporium Herbarum: <0.1 kU/L
D. farinae: 0.65 kU/L — ABNORMAL HIGH
Elm IgE: <0.1 kU/L
IgE (Immunoglobulin E), Serum: 74.1 IU/mL (ref 0.0–180.0)
Johnson Grass: <0.1 kU/L
Lamb's Quarters: <0.1 kU/L
Oak: <0.1 kU/L

## 2013-04-20 NOTE — Progress Notes (Signed)
Quick Note:  Patient returned call. Advised of lab results / recs as stated by MW. Pt verbalized understanding and denied any questions. Pt stated that she will wait for next ov w/ MW to follow up on the Iron Profile. ______

## 2013-04-20 NOTE — Progress Notes (Signed)
Quick Note:  Patient returned call. Advised of lab results / recs as stated by MW. Pt verbalized understanding and denied any questions. ______ 

## 2013-05-03 ENCOUNTER — Telehealth: Payer: Self-pay | Admitting: Medical

## 2013-05-03 ENCOUNTER — Other Ambulatory Visit: Payer: Self-pay | Admitting: Medical

## 2013-05-03 NOTE — Telephone Encounter (Signed)
RX refill was sent to the pharmacy. CLS

## 2013-05-03 NOTE — Telephone Encounter (Signed)
Pt is requesting refill on her Zoloft 100 mg sent to [facility] on [address].  She is completely out as of today.

## 2013-05-12 ENCOUNTER — Other Ambulatory Visit: Payer: Self-pay | Admitting: Internal Medicine

## 2013-05-14 ENCOUNTER — Telehealth: Payer: Self-pay | Admitting: Internal Medicine

## 2013-05-14 MED ORDER — CEFDINIR 300 MG PO CAPS: 300.0000 mg | ORAL_CAPSULE | Freq: Two times a day (BID) | ORAL | Status: DC

## 2013-05-14 NOTE — Telephone Encounter (Signed)
Pt is aware of recs. RX sent. Nothing further needed

## 2013-05-14 NOTE — Telephone Encounter (Signed)
Most likely from sinus dz and ok to restart omnicef x 14 more days pending f/u with only other option being much more expensive, many more side effect fluoroquinolone

## 2013-05-14 NOTE — Telephone Encounter (Signed)
Called and spoke with pt and she stated that she finished the omnicef x 1 week ago.  She has not started back with cough, thick green/brown sputum when she can get this out of her chest.  She stated that the sputum is so thick that this is choking her at times.  She stated that she has appt with MW on 11/26 but she feels that she cannot wait that long to get something else to clear this bronchitis up.  MW please advise. Thanks  Allergies  Allergen Reactions  . Statins     Myalgias with all statins  . Amoxicillin Hives and Rash     Current Outpatient Prescriptions on File Prior to Visit  Medication Sig Dispense Refill  . albuterol (VENTOLIN HFA) 108 (90 BASE) MCG/ACT inhaler Inhale 2 puffs into the lungs every 6 (six) hours as needed for wheezing.  1 Inhaler  1  . budesonide-formoterol (SYMBICORT) 160-4.5 MCG/ACT inhaler Inhale 2 puffs into the lungs 2 (two) times daily.  1 Inhaler  0  . cefdinir (OMNICEF) 300 MG capsule Take 1 capsule (300 mg total) by mouth 2 (two) times daily.  28 capsule  0  . esomeprazole (NEXIUM) 40 MG capsule Take 40 mg by mouth 2 (two) times daily.      Marland Kitchen olmesartan (BENICAR) 20 MG tablet Take 20 mg by mouth daily.      Marland Kitchen rOPINIRole (REQUIP) 0.25 MG tablet 1 tablet daily QHS  30 tablet  11  . sertraline (ZOLOFT) 100 MG tablet TAKE ONE AND ONE-HALF TABLETS BY MOUTH DAILY  45 tablet  0  . traMADol (ULTRAM) 50 MG tablet 1-2 every 4 hours as needed for cough or pain  40 tablet  0  . triamcinolone (NASACORT) 55 MCG/ACT nasal inhaler Place 2 sprays into the nose daily.       No current facility-administered medications on file prior to visit.

## 2013-05-18 ENCOUNTER — Ambulatory Visit: Payer: BC Managed Care – PPO | Admitting: Internal Medicine

## 2013-05-19 ENCOUNTER — Encounter: Payer: Self-pay | Admitting: Internal Medicine

## 2013-05-19 ENCOUNTER — Ambulatory Visit (INDEPENDENT_AMBULATORY_CARE_PROVIDER_SITE_OTHER): Payer: BC Managed Care – PPO | Admitting: Internal Medicine

## 2013-05-19 VITALS — BP 124/74 | HR 78 | Temp 98.1°F | Ht 62.0 in | Wt 180.0 lb

## 2013-05-19 DIAGNOSIS — J45909 Unspecified asthma, uncomplicated: Secondary | ICD-10-CM

## 2013-05-19 DIAGNOSIS — R059 Cough, unspecified: Secondary | ICD-10-CM

## 2013-05-19 DIAGNOSIS — R05 Cough: Secondary | ICD-10-CM

## 2013-05-19 DIAGNOSIS — R058 Other specified cough: Secondary | ICD-10-CM

## 2013-05-19 MED ORDER — TRAMADOL HCL 50 MG PO TABS: ORAL_TABLET | ORAL | Status: DC

## 2013-05-19 MED ORDER — MONTELUKAST SODIUM 10 MG PO TABS: ORAL_TABLET | ORAL | Status: DC

## 2013-05-19 MED ORDER — PREDNISONE (PAK) 10 MG PO TABS: ORAL_TABLET | ORAL | Status: DC

## 2013-05-19 NOTE — Assessment & Plan Note (Signed)
-   hfa 75% p coaching 03/19/13  - PFT's 04/16/13 airflow normalizes p B2 (a 15% improvement from baseline) with nl DLCO   Despite flare of cough, Adequate control on present rx, reviewed > no change in rx needed  But will add singulair trial for rhinitis

## 2013-05-19 NOTE — Progress Notes (Signed)
Subjective:    Patient ID: Shannon Mckenzie, female    DOB: 26-Jun-1960    MRN: [id number]    Brief patient profile:  51 yowf  quit smoking 1993 no resp problems but within a year started having a cough sev times a year treated with abx and sometimes prednisone but never persistent  at all then suddenly at work around 2007 ? Exposure > dx as copd and treated with symbicort which seemed to help  until   2013 while on ACEi and tried different inhalers didn't work and went back to [employer] and then developed cough around summer 2014 and eval by [name] for Sob > neg > referred to pulmonary clinic 03/19/2013 with evidence of completely reversible airflow obst by pfts 03/2013     History of Present Illness  03/19/2013 [facility] office visit/ Kagan Mutchler cc daily x 2 months x cough > yellow mucus off and on all day assoc with  Some nasal congestion worse at hs but does not wake her up with doe x steps and much better p ventolin. Typically does ok on flat surfaces. rec Doxycycline 100 mg twice daily before you eat with glass of water x 10 days > if mucus is not clear you need a sinus ct [name] at [phone]) Continue symbicort but Work on inhaler technique:  Mucinex dm up 1200mg  every  12 hours as needed cough - if not effective fill the rx for tramadol to take up to every 4 hours Nexium 40 mg  Take 30- 60 min before your first and last meals of the day  GERD diet  04/16/2013 f/u ov/Emmie Frakes re: asthma Chief Complaint  Patient presents with  . Follow-up    Pt states that her breathing has improved since the last visit. Cough has improved- no longer coughing during ther day, but still bothers her at night.    no sign limiting sob, no need for saba, still some green mucus in ams rec omnicef 300 twice daily x 14 days and call if not 100% better Ok to use symbicort 160 Take 2 puffs first thing in am and then another 2 puffs about 12 hours later.  05/19/2013 f/u ov/Ceciley Buist re: cough flare Chief Complaint   Patient presents with  . Follow-up    Pt c/o worsening cough- prod with green sputum-currently on round of cefdinir. She states breathing is unchanged since last visit. Has not used rescue inhaler but has used neb x 1 since last visit  cough worse w/in a week of stopping the abx (on the 7th ) = Nov 15th started back coughing and started back on omnicef 11/ 21 x 14 days.  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 No increased sob.  No obvious patterns in day to day or daytime variabilty or assoc  cp or chest tightness, subjective wheeze overt  hb symptoms. No unusual exp hx or h/o childhood pna/ asthma or knowledge of premature birth.     Also denies any obvious fluctuation of symptoms with weather or environmental changes or other aggravating or alleviating factors except as outlined above   Current Medications, Allergies, Complete Past Medical History, Past Surgical History, Family History, and Social History were reviewed in [name] record.  ROS  The following are not active complaints unless bolded sore throat, dysphagia, dental problems, itching, sneezing,  nasal congestion or excess/ purulent secretions, ear ache,   fever, chills, sweats, unintended wt loss, pleuritic or  exertional cp, hemoptysis,  orthopnea pnd or leg swelling, presyncope, palpitations, heartburn, abdominal pain, anorexia, nausea, vomiting, diarrhea  or change in bowel or urinary habits, change in stools or urine, dysuria,hematuria,  rash, arthralgias, visual complaints, headache, numbness weakness or ataxia or problems with walking or coordination,  change in mood/affect or memory.                        Objective:   Physical Exam  amb wf nad with harsh barking cough on insp  04/16/13          179  > 05/19/2013  180 Wt Readings from Last 3 Encounters:  03/19/13 187 lb (84.823 kg)  03/02/13 177 lb (80.287 kg)  10/06/12 184 lb (83.462 kg)      HEENT: nl dentition, turbinates, and orophanx. Nl external ear canals without cough reflex   NECK :  without JVD/Nodes/TM/ nl carotid upstrokes bilaterally   LUNGS: no acc muscle use, clear to A and P bilaterally with  cough on insp, not exp   CV:  RRR  no s3 or murmur or increase in P2, no edema   ABD:  soft and nontender with nl excursion in the supine position. No bruits or organomegaly, bowel sounds nl  MS:  warm without deformities, calf tenderness, cyanosis or clubbing          CTa 03/11/13  Negative for pulmonary embolism.  Scattered small nodules in both lungs. Small cluster of nodules in  the medial left lower lobe probably represents an infectious or  inflammatory process of unknown age. The other small pulmonary  nodules are nonspecific  Sinus CT 04/16/2013 Mild mucosal thickening frontal sinuses bilaterally.  Prominent opacification ethmoid sinus air cells bilaterally.  Prominent opacification maxillary sinuses bilaterally with air-fluid  level on the right suggesting acute sinusitis.  Mild mucosal thickening anterior aspect of the sphenoid sinus air  cells.        Assessment & Plan:

## 2013-05-19 NOTE — Assessment & Plan Note (Signed)
-   ACEi d/c around 03/03/13  - Sinus CT 04/16/2013 > Mild mucosal thickening frontal sinuses bilaterally.  Prominent opacification ethmoid sinus air cells bilaterally.  Prominent opacification maxillary sinuses bilaterally with air-fluid  level on the right suggesting acute sinusitis.  Mild mucosal thickening anterior aspect of the sphenoid sinus air  Cells. - Allergy profile 04/16/2013 > Eos 14.4% and IgE= 74 >  Cat dander > dog > grass - added singulair 05/19/2013    I reviewed both graphic and text formatted material regarding the diagnosis and management of rhinitis including how to use topical steroids effectively and why it is necessary to treat nasal obstruction and inflammation and infection all at  the same time to eradicate the cycle of inflammation causing obstruction causing an infection causing inflammation and so forth and so on....   See instructions for specific recommendations which were reviewed directly with the patient who was given a copy with highlighter outlining the key components.   Added singulair to maint rx on trial basis then sinus ct in 2 weeks when will have completed another 14 d round of omnicef If sinuses still severely obst p 2 weeks more omnicef >  ENT eval

## 2013-05-19 NOTE — Patient Instructions (Addendum)
Take mucinex dm up to 1200 mg every  12 hours and supplement if needed with  tramadol 50 mg up to 2 every 4 hours to suppress the urge to cough. Swallowing water or using ice chips/non mint and menthol containing candies (such as lifesavers or sugarless jolly ranchers) are also effective.  You should rest your voice and avoid activities that you know make you cough.  Once you have eliminated the cough for 3 straight days try reducing the tramadol first,  then the mucinex dm   Prednisone 10 mg take  4 each am x 2 days,   2 each am x 2 days,  1 each am x 2 days and stop   Please see patient coordinator before you leave today  to schedule sinus CT Monday the 8th of Dec

## 2013-06-01 ENCOUNTER — Ambulatory Visit (INDEPENDENT_AMBULATORY_CARE_PROVIDER_SITE_OTHER)
Admission: RE | Admit: 2013-06-01 | Discharge: 2013-06-01 | Disposition: A | Discharge status: Home / Self Care | Payer: BC Managed Care – PPO | Source: Ambulatory Visit | Attending: Internal Medicine | Admitting: Internal Medicine

## 2013-06-01 ENCOUNTER — Encounter: Payer: Self-pay | Admitting: Internal Medicine

## 2013-06-01 DIAGNOSIS — R059 Cough, unspecified: Secondary | ICD-10-CM

## 2013-06-01 DIAGNOSIS — R05 Cough: Secondary | ICD-10-CM

## 2013-06-01 DIAGNOSIS — R058 Other specified cough: Secondary | ICD-10-CM

## 2013-06-01 NOTE — Progress Notes (Signed)
Quick Note:  ATC, NA and no option to leave a msg,mailbox is full, WCB ______

## 2013-06-02 ENCOUNTER — Telehealth: Payer: Self-pay | Admitting: Internal Medicine

## 2013-06-02 NOTE — Progress Notes (Signed)
Quick Note:  Spoke with patient-states she doing good right now; will call us to regroup if she gets worse. ______

## 2013-06-02 NOTE — Telephone Encounter (Signed)
Notes Recorded by Christen Butter, CMA on 06/01/2013 at 1:49 PM ATC, NA and no option to leave a msg,mailbox is full, WCB ------  Notes Recorded by Nyoka Cowden, MD on 06/01/2013 at 1:10 PM Call patient : Study is c/w marked improvement but if still having symptoms needs ov to regroup      Spoke with patient-states she doing good right now; will call us to regroup if she gets worse.

## 2013-06-04 ENCOUNTER — Telehealth: Payer: Self-pay | Admitting: Medical

## 2013-06-04 ENCOUNTER — Other Ambulatory Visit: Payer: Self-pay | Admitting: Medical

## 2013-06-04 MED ORDER — SERTRALINE HCL 100 MG PO TABS: 100.0000 mg | ORAL_TABLET | Freq: Every day | ORAL | Status: DC

## 2013-06-04 NOTE — Telephone Encounter (Signed)
Have her recheck in January.  zoloft sent. Hope she has a wonderful holiday.

## 2013-06-09 ENCOUNTER — Telehealth: Payer: Self-pay | Admitting: Internal Medicine

## 2013-06-09 NOTE — Telephone Encounter (Signed)
lmomtcb x1 for pt. Per last results note if not improving she needs OV with MW

## 2013-06-10 NOTE — Telephone Encounter (Signed)
Returning call can be reached at [phone].Raylene Everts

## 2013-06-10 NOTE — Telephone Encounter (Signed)
[email or web address]. Jennifer Castillo, CMA  

## 2013-06-10 NOTE — Telephone Encounter (Signed)
Spoke with pt and appt scheduled. Nothing further needed 

## 2013-06-14 ENCOUNTER — Encounter: Payer: Self-pay | Admitting: Internal Medicine

## 2013-06-14 ENCOUNTER — Ambulatory Visit (INDEPENDENT_AMBULATORY_CARE_PROVIDER_SITE_OTHER): Payer: BC Managed Care – PPO | Admitting: Internal Medicine

## 2013-06-14 VITALS — BP 132/70 | HR 82 | Temp 98.2°F | Ht 62.0 in | Wt 180.0 lb

## 2013-06-14 DIAGNOSIS — J45909 Unspecified asthma, uncomplicated: Secondary | ICD-10-CM

## 2013-06-14 DIAGNOSIS — R059 Cough, unspecified: Secondary | ICD-10-CM

## 2013-06-14 DIAGNOSIS — R05 Cough: Secondary | ICD-10-CM

## 2013-06-14 DIAGNOSIS — J329 Chronic sinusitis, unspecified: Secondary | ICD-10-CM

## 2013-06-14 DIAGNOSIS — R058 Other specified cough: Secondary | ICD-10-CM

## 2013-06-14 MED ORDER — CEFDINIR 300 MG PO CAPS: 300.0000 mg | ORAL_CAPSULE | Freq: Two times a day (BID) | ORAL | Status: DC

## 2013-06-14 NOTE — Patient Instructions (Addendum)
Please see patient coordinator before you leave today  to schedule ENT evaluation  Remember   that nasal steroids have no immediate benefit in terms of improving symptoms.  To help them reached the target tissue, you t should use Afrin two puffs every 12 hours applied one min before using the nasal steroids.  Afrin should be stopped after no more than 5 days.  If the symptoms worsen, Afrin can be restarted after 5 days off of therapy to prevent rebound congestion from overuse of Afrin.  I also emphasized that in no way are nasal steroids a concern in terms of "addiction".   If worsen cough in meantime ok to start omnicef 300 mg twice daily x 14 days   Plan to return here after we sort out your sinus problems because that needs to be addressed first then may be able to use simplified regimen for your cough

## 2013-06-14 NOTE — Progress Notes (Signed)
Subjective:    Patient ID: Shannon Mckenzie, female    DOB: [birth year]    MRN: [id number]    Brief patient profile:   52 yowf  quit smoking 1993 no resp problems but within a year started having a cough sev times a year treated with abx and sometimes prednisone but never persistent  at all then  at work around 2007 p ? Exposure > dx as copd and treated with symbicort which seemed to help  until   2013 while on ACEi and tried different inhalers didn't work and went back to [employer] and then developed cough around summer 2014 and eval by [name] for Sob > neg > referred to pulmonary clinic 03/19/2013 with evidence of completely reversible airflow obst by pfts 03/2013     History of Present Illness  03/19/2013 [facility] office visit/ Margorie Renner cc daily x 2 months x cough > yellow mucus off and on all day assoc with  Some nasal congestion worse at hs but does not wake her up with doe x steps and much better p ventolin. Typically does ok on flat surfaces. rec Doxycycline 100 mg twice daily before you eat with glass of water x 10 days > if mucus is not clear you need a sinus ct [name] at [phone]) Continue symbicort but Work on inhaler technique:  Mucinex dm up 1200mg  every  12 hours as needed cough - if not effective fill the rx for tramadol to take up to every 4 hours Nexium 40 mg  Take 30- 60 min before your first and last meals of the day  GERD diet  04/16/2013 f/u ov/Brenleigh Collet re: asthma Chief Complaint  Patient presents with  . Follow-up    Pt states that her breathing has improved since the last visit. Cough has improved- no longer coughing during ther day, but still bothers her at night.    no sign limiting sob, no need for saba, still some green mucus in ams rec omnicef 300 twice daily x 14 days and call if not 100% better Ok to use symbicort 160 Take 2 puffs first thing in am and then another 2 puffs about 12 hours later.  05/19/2013 f/u ov/Shuntia Exton re: cough flare Chief Complaint   Patient presents with  . Follow-up    Pt c/o worsening cough- prod with green sputum-currently on round of cefdinir. She states breathing is unchanged since last visit. Has not used rescue inhaler but has used neb x 1 since last visit  cough worse w/in a week of stopping the abx (on the 7th ) = Nov 15th started back coughing and started back on omnicef 11/ 21 x 14 days.  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 No increased sob. rec Add singulair  Take mucinex dm up to 1200 mg every  12 hours and supplement if needed with  tramadol 50 mg up to 2 every 4 hours to suppress the urge to cough.  Once you have eliminated the cough for 3 straight days try reducing the tramadol first,  then the mucinex dm  Prednisone 10 mg take  4 each am x 2 days,   2 each am x 2 days,  1 each am x 2 days and stop    06/14/2013 acute ov/Toleen Lachapelle re: cough on maint rx with symbicort/ singulair/ nasacort Chief Complaint  Patient presents with  . Acute Visit    Pt c/o PND, sneezing and cough for the past 10  days.    no sob over baseline nor need for saba. Cough min prod mucoid, esp in ams   No obvious patterns in day to day or daytime variabilty or assoc  cp or chest tightness, subjective wheeze overt  hb symptoms. No unusual exp hx or h/o childhood pna/ asthma or knowledge of premature birth.     Also denies any obvious fluctuation of symptoms with weather or environmental changes or other aggravating or alleviating factors except as outlined above   Current Medications, Allergies, Complete Past Medical History, Past Surgical History, Family History, and Social History were reviewed in [name] record.  ROS  The following are not active complaints unless bolded sore throat, dysphagia, dental problems, itching, sneezing,  nasal congestion or excess/ purulent secretions, ear ache,   fever, chills, sweats, unintended wt loss, pleuritic or exertional  cp, hemoptysis,  orthopnea pnd or leg swelling, presyncope, palpitations, heartburn, abdominal pain, anorexia, nausea, vomiting, diarrhea  or change in bowel or urinary habits, change in stools or urine, dysuria,hematuria,  rash, arthralgias, visual complaints, headache, numbness weakness or ataxia or problems with walking or coordination,  change in mood/affect or memory.                        Objective:   Physical Exam  amb wf nad with harsh barking cough on insp  04/16/13          179  > 05/19/2013  180 > 06/14/2013 180  Wt Readings from Last 3 Encounters:  03/19/13 187 lb (84.823 kg)  03/02/13 177 lb (80.287 kg)  10/06/12 184 lb (83.462 kg)     HEENT: nl dentition,  and orophanx. Nl external ear canals without cough reflex Mod non-specific turbinate edema bilat   NECK :  without JVD/Nodes/TM/ nl carotid upstrokes bilaterally   LUNGS: no acc muscle use, clear to A and P bilaterally with  cough on insp, not exp   CV:  RRR  no s3 or murmur or increase in P2, no edema   ABD:  soft and nontender with nl excursion in the supine position. No bruits or organomegaly, bowel sounds nl  MS:  warm without deformities, calf tenderness, cyanosis or clubbing          CTa 03/11/13  Negative for pulmonary embolism.  Scattered small nodules in both lungs. Small cluster of nodules in  the medial left lower lobe probably represents an infectious or  inflammatory process of unknown age. The other small pulmonary  nodules are nonspecific  Sinus CT 04/16/2013 Mild mucosal thickening frontal sinuses bilaterally.  Prominent opacification ethmoid sinus air cells bilaterally.  Prominent opacification maxillary sinuses bilaterally with air-fluid  level on the right suggesting acute sinusitis.  Mild mucosal thickening anterior aspect of the sphenoid sinus air  cells.        Assessment & Plan:

## 2013-06-15 NOTE — Telephone Encounter (Signed)
done

## 2013-06-15 NOTE — Assessment & Plan Note (Signed)
-   hfa 75% p coaching 03/19/13  - PFT's 04/16/13 airflow normalizes p B2 (a 15% improvement from baseline) with nl DLCO  - added singulair 05/19/2013   All goals of chronic asthma control met including optimal function and elimination of symptoms with minimal need for rescue therapy.  Contingencies discussed in full including contacting this office immediately if not controlling the symptoms using the rule of two's.

## 2013-06-15 NOTE — Assessment & Plan Note (Signed)
-   ACEi d/c around 03/03/13  - Sinus CT 04/16/2013 > Mild mucosal thickening frontal sinuses bilaterally.  Prominent opacification ethmoid sinus air cells bilaterally.  Prominent opacification maxillary sinuses bilaterally with air-fluid  level on the right suggesting acute sinusitis.  Mild mucosal thickening anterior aspect of the sphenoid sinus air  Cells.> repeat 06/01/2013 Marked improvement in bilateral maxillary sinus disease with onlyslight residual dependent fluid on the left. Marked improvement bilateral frontal sinus disease with only slight residual fluid or mucosal thickening on the right.  - Allergy profile 04/16/2013 > Eos 14.4% and IgE= 74 >  Cat dander > dog > grass - added singulair 05/19/2013  - flared again 06/14/13 > referred to ENT

## 2013-06-15 NOTE — Assessment & Plan Note (Signed)
I emphasized that nasal steroids have no immediate benefit in terms of improving symptoms.  To help them reached the target tissue, the patient should use Afrin two puffs every 12 hours applied one min before using the nasal steroids.  Afrin should be stopped after no more than 5 days.  If the symptoms worsen, Afrin can be restarted after 5 days off of therapy to prevent rebound congestion from overuse of Afrin.  I also emphasized that in no way are nasal steroids a concern in terms of "addiction".   Since most of her symptoms are sinus related and she did not clear all the air fluid levels p last prolonged abx rec ent eval at this point with 14 more days of omnicef if worsen in interim

## 2013-06-19 ENCOUNTER — Other Ambulatory Visit: Payer: Self-pay | Admitting: Internal Medicine

## 2013-06-29 ENCOUNTER — Encounter: Payer: Self-pay | Admitting: Medical

## 2013-06-29 ENCOUNTER — Ambulatory Visit (INDEPENDENT_AMBULATORY_CARE_PROVIDER_SITE_OTHER): Payer: BC Managed Care – PPO | Admitting: Medical

## 2013-06-29 VITALS — BP 140/82 | HR 74 | Temp 98.1°F | Resp 16 | Ht 62.0 in | Wt 177.0 lb

## 2013-06-29 DIAGNOSIS — M79609 Pain in unspecified limb: Secondary | ICD-10-CM

## 2013-06-29 DIAGNOSIS — F3289 Other specified depressive episodes: Secondary | ICD-10-CM

## 2013-06-29 DIAGNOSIS — Z0000 Encounter for general adult medical examination without abnormal findings: Secondary | ICD-10-CM

## 2013-06-29 DIAGNOSIS — G2581 Restless legs syndrome: Secondary | ICD-10-CM

## 2013-06-29 DIAGNOSIS — J45909 Unspecified asthma, uncomplicated: Secondary | ICD-10-CM

## 2013-06-29 DIAGNOSIS — M7021 Olecranon bursitis, right elbow: Secondary | ICD-10-CM

## 2013-06-29 DIAGNOSIS — F32A Depression, unspecified: Secondary | ICD-10-CM

## 2013-06-29 DIAGNOSIS — M79671 Pain in right foot: Secondary | ICD-10-CM

## 2013-06-29 DIAGNOSIS — M7020 Olecranon bursitis, unspecified elbow: Secondary | ICD-10-CM

## 2013-06-29 DIAGNOSIS — I10 Essential (primary) hypertension: Secondary | ICD-10-CM

## 2013-06-29 DIAGNOSIS — E785 Hyperlipidemia, unspecified: Secondary | ICD-10-CM

## 2013-06-29 DIAGNOSIS — Z789 Other specified health status: Secondary | ICD-10-CM

## 2013-06-29 DIAGNOSIS — F329 Major depressive disorder, single episode, unspecified: Secondary | ICD-10-CM

## 2013-06-29 LAB — POCT URINALYSIS DIPSTICK
Bilirubin, UA: NEGATIVE
Glucose, UA: NEGATIVE
Ketones, UA: NEGATIVE
LEUKOCYTES UA: NEGATIVE
NITRITE UA: NEGATIVE
PH UA: 5
PROTEIN UA: NEGATIVE
RBC UA: NEGATIVE
Spec Grav, UA: 1.015
Urobilinogen, UA: NEGATIVE

## 2013-06-29 MED ORDER — OLMESARTAN MEDOXOMIL 20 MG PO TABS: 20.0000 mg | ORAL_TABLET | Freq: Every day | ORAL | Status: DC

## 2013-06-29 MED ORDER — ROPINIROLE HCL 0.5 MG PO TABS: 0.5000 mg | ORAL_TABLET | Freq: Every day | ORAL | Status: DC

## 2013-06-29 MED ORDER — SERTRALINE HCL 100 MG PO TABS: 150.0000 mg | ORAL_TABLET | Freq: Every day | ORAL | Status: DC

## 2013-06-29 MED ORDER — ESOMEPRAZOLE MAGNESIUM 40 MG PO CPDR: 40.0000 mg | DELAYED_RELEASE_CAPSULE | Freq: Two times a day (BID) | ORAL | Status: DC

## 2013-06-29 NOTE — Progress Notes (Signed)
Subjective:   HPI  Shannon Mckenzie is a 53 y.o. female who presents for a complete physical.  Preventative care: Last ophthalmology visit:yes- [facility]- today Last dental visit:yes Dr. Dahlia ClientHannah every 6 months Last colonoscopy: yes - 06/2012, Dr. Ramon DredgeEdward, endocopsy and colonoscpoy, advised repeat colonoscopy 10 years Last mammogram:has an appointment Last gynecological exam:05/2013, last month, [name], NP, pap updated EKG:03/2012 Last labs:2013  Prior vaccinations: TD or Tdap:01/2005 Influenza:2014 Pneumococcal:2013 Shingles/Zostavax:n/A Other: Dr.Wert, ENT, Dr. Jenne PaneBates  Advanced directive:N/A Health care power of attorney:N/A Living will:N/A  Concerns: Encounter Diagnoses  Name Primary?  . Routine general medical examination at a health care facility Yes  . Hyperlipidemia   . Essential hypertension, benign   . RLS (restless legs syndrome)   . Statin intolerance   . Reactive airway disease   . Depression   . Bursitis of elbow, right   . Heel pain, right    She still is being followed by pulmonology, but they recently told her she actually does not have COPD. They believe she has an airway issue and allergies causing nasal congestion and breathing issues that are misinterpreted as lung issues. She has cats and dogs and happens to be allergic to both.  When she saw cardiology last year Dr. Donnie Ahoilley said that everything was okay. She had stress test and echo. She has upcoming followup with ENT and allergist   Hypertension-compliant with medication without complaint  RLS-she does note improvement with Requip, but feels that we need to up the dose, takes at nighttime  Depressoin-doing fine on Zoloft 150 mg daily, but for whatever reason to 100 mg was refilled recent  She notes occasional swelling and pain of the right elbow, worse if she puts pressure on this   Lately having some pain in her right heel  Reviewed their medical, surgical, family, social, medication, and  allergy history and updated chart as appropriate.   Review of Systems Constitutional: -fever, -chills, -sweats, -unexpected weight change, -decreased appetite, -fatigue Allergy: -sneezing, -itching, -congestion Dermatology: -changing moles, --rash, -lumps ENT: -runny nose, -ear pain, -sore throat, -hoarseness, -sinus pain, -teeth pain, - ringing in ears, -hearing loss, -nosebleeds Cardiology: -chest pain, -palpitations, -swelling, -difficulty breathing when lying flat, -waking up short of breath Respiratory: -cough, -shortness of breath, -difficulty breathing with exercise or exertion, -wheezing, -coughing up blood Gastroenterology: -abdominal pain, -nausea, -vomiting, -diarrhea, -constipation, -blood in stool, -changes in bowel movement, -difficulty swallowing or eating Hematology: -bleeding, -bruising  Musculoskeletal: -joint aches, -muscle aches, -joint swelling, -back pain, -neck pain, -cramping, -changes in gait Ophthalmology: denies vision changes, eye redness, itching, discharge Urology: -burning with urination, -difficulty urinating, -blood in urine, -urinary frequency, -urgency, -incontinence Neurology: -headache, -weakness, -tingling, +numbness, -memory loss, -falls, -dizziness Psychology: -depressed mood, -agitation, -sleep problems     Objective:   Physical Exam  BP 140/82  Pulse 74  Temp(Src) 98.1 F (36.7 C) (Oral)  Resp 16  Ht 5\' 2"  (1.575 m)  Wt 177 lb (80.287 kg)  BMI 32.37 kg/m2  LMP 05/25/2013  General appearance: alert, no distress, WD/WN Skin: Scattered benign-appearing macules throughout no particular worrisome lesions HEENT: normocephalic, conjunctiva/corneas normal, sclerae anicteric, PERRLA, EOMi, nares with turbinate edema and clear discharge, pharynx normal Oral cavity: MMM, tongue normal, teeth normal Neck: supple, no lymphadenopathy, no thyromegaly, no masses, normal ROM, no bruits Chest: non tender, normal shape and expansion Heart: RRR, normal S1,  S2, no murmurs Lungs: CTA bilaterally, no wheezes, rhonchi, or rales Abdomen: +bs, soft, non tender, non distended, no masses,  no hepatomegaly, no splenomegaly, no bruits Back: non tender, normal ROM, no scoliosis Musculoskeletal: Slight puffiness of the right olecranon bursa, but no erythema or warmth. Slight tenderness of right heel. Otherwise upper extremities non tender, no obvious deformity, normal ROM throughout, lower extremities non tender, no obvious deformity, normal ROM throughout Extremities: no edema, no cyanosis, no clubbing Pulses: 2+ symmetric, upper and lower extremities, normal cap refill Neurological: alert, oriented x 3, CN2-12 intact, strength normal upper extremities and lower extremities, sensation normal throughout, DTRs 2+ throughout, no cerebellar signs, gait normal Psychiatric: normal affect, behavior normal, pleasant  Breast/gyn/rectal - deferred to gyn  Assessment and Plan :    Encounter Diagnoses  Name Primary?  . Routine general medical examination at a health care facility Yes  . Hyperlipidemia   . Essential hypertension, benign   . RLS (restless legs syndrome)   . Statin intolerance   . Reactive airway disease   . Depression   . Bursitis of elbow, right   . Heel pain, right     Physical exam - discussed healthy lifestyle, diet, exercise, preventative care, vaccinations, and addressed their concerns.  Handout given. Updated her chart record today. Hyperlipidemia with statin intolerance as well as intolerance to Vytorin.  Pending labs consider other medication options Hypertension-continue current medication RLS-increase to 0.5 mg daily Reactive airway disease-reviewed recent records. At this point pulmonology feels that she has more of an allergy and reactive process related to nasopharyngeal airway and not true COPD she is still being followed by pulmonology and ENT. Recently had several positive findings on allergy testing. Depression continue Zoloft  150 mg daily Bursitis-advise not putting pressure on the elbow, ice pack when necessary, ibuprofen over-the-counter when necessary, recheck if worse Heel pain-likely a bone spur, begin bilateral heel cups over-the-counter Follow-up pending labs

## 2013-06-30 ENCOUNTER — Other Ambulatory Visit: Payer: Self-pay | Admitting: Medical

## 2013-06-30 LAB — COMPREHENSIVE METABOLIC PANEL
ALBUMIN: 4 g/dL (ref 3.5–5.2)
ALK PHOS: 74 U/L (ref 39–117)
ALT: 18 U/L (ref 0–35)
AST: 21 U/L (ref 0–37)
BUN: 15 mg/dL (ref 6–23)
CO2: 27 mEq/L (ref 19–32)
Calcium: 8.8 mg/dL (ref 8.4–10.5)
Chloride: 103 mEq/L (ref 96–112)
Creat: 0.63 mg/dL (ref 0.50–1.10)
GLUCOSE: 78 mg/dL (ref 70–99)
POTASSIUM: 4.1 meq/L (ref 3.5–5.3)
Sodium: 138 mEq/L (ref 135–145)
Total Bilirubin: 0.6 mg/dL (ref 0.3–1.2)
Total Protein: 6.7 g/dL (ref 6.0–8.3)

## 2013-06-30 LAB — LIPID PANEL
Cholesterol: 271 mg/dL — ABNORMAL HIGH (ref 0–200)
HDL: 68 mg/dL (ref >39)
LDL CALC: 194 mg/dL — AB (ref 0–99)
Total CHOL/HDL Ratio: 4 Ratio
Triglycerides: 46 mg/dL (ref <150)
VLDL: 9 mg/dL (ref 0–40)

## 2013-06-30 MED ORDER — EZETIMIBE 10 MG PO TABS: 10.0000 mg | ORAL_TABLET | Freq: Every day | ORAL | Status: DC

## 2013-07-02 ENCOUNTER — Telehealth: Payer: Self-pay | Admitting: Family Medicine

## 2013-07-02 NOTE — Telephone Encounter (Signed)
Patient states that Dr. Loura BackWart did labs on her and her Hemoglobin was low and she wants to know if she should take some over the counter iron medication. She said they her hemoglobin was 12.1. She said she will try the Zetia. CLS

## 2013-07-05 NOTE — Telephone Encounter (Signed)
Zetia was already sent to the pharmacy.   Its Dr. Sherene SiresWert in the message, see correct spelling.  As far as the hemoglobin, her last hemoglobin here in October was normal, though lets just plan to recheck in in 3 or 4 months.

## 2013-07-06 ENCOUNTER — Telehealth: Payer: Self-pay | Admitting: Medical

## 2013-07-06 NOTE — Telephone Encounter (Signed)
Patient is aware of Kristian CoveyShane Tysinger PA-C message and his recommendations. CLS

## 2013-07-13 ENCOUNTER — Encounter: Payer: Self-pay | Admitting: Medical

## 2013-07-13 ENCOUNTER — Ambulatory Visit (INDEPENDENT_AMBULATORY_CARE_PROVIDER_SITE_OTHER): Payer: BC Managed Care – PPO | Admitting: Medical

## 2013-07-13 VITALS — BP 140/80 | HR 64 | Temp 97.8°F | Resp 16 | Wt 177.0 lb

## 2013-07-13 DIAGNOSIS — H8110 Benign paroxysmal vertigo, unspecified ear: Secondary | ICD-10-CM

## 2013-07-13 MED ORDER — MECLIZINE HCL 25 MG PO TABS: 25.0000 mg | ORAL_TABLET | Freq: Three times a day (TID) | ORAL | Status: DC | PRN

## 2013-07-13 NOTE — Telephone Encounter (Signed)
Please let patient know about this issue. This is the time of year when the insurance companies update their formulary and deny coverage for medications they have been stable on which is annoying.  She can fight [employer] company if she wants otherwise I'll need to switch her to Cozaar 50.  If agreeable, let me know so I can send out Cozaar.

## 2013-07-13 NOTE — Telephone Encounter (Signed)
[facility] [place] [employer] denied, pt must try covered alternative Cozaar/Hyzaar, Diovan/Diovan HCT.  Do you want to switch?

## 2013-07-13 NOTE — Progress Notes (Signed)
  Subjective:  Shannon Mckenzie is a 53 y.o. female who presents for dizziness. She was brought in by her friend. When she got out of bed this morning had immediate dizziness felt the room spinning around. This caused her to become nauseated and vomited once. Her dizziness was persistent for about 45 minutes to an hour until she vomited. After she vomited thing started feeling much better.  Currently she is not too dizzy at the moment. She denies ringing in ears, hearing loss, numbness or tingling, no weakness, no fall, no vision or hearing changes, no chest pain, no shortness of breath, no edema. No syncope. No other aggravating or relieving factors.    No other c/o.  The following portions of the patient's history were reviewed and updated as appropriate: allergies, current medications, past family history, past medical history, past social history, past surgical history and problem list.  ROS Otherwise as in subjective above  Objective: Physical Exam BP 140/80  Pulse 64  Temp(Src) 97.8 F (36.6 C) (Oral)  Resp 16  Wt 177 lb (80.287 kg)  General appearance: alert, no distress, WD/WN HEENT: normocephalic, sclerae anicteric, conjunctiva pink and moist, TMs pearly, nares patent, no discharge or erythema, pharynx normal Oral cavity: MMM, no lesions Neck: supple, no lymphadenopathy, no thyromegaly, no masses Heart: RRR, normal S1, S2, no murmurs Lungs: CTA bilaterally, no wheezes, rhonchi, or rales Pulses: 2+ radial pulses, 2+ pedal pulses, normal cap refill Ext: no edema Neuro: CN2-12 intact, nonfocal exam   Assessment: Encounter Diagnosis  Name Primary?  . Benign paroxysmal positional vertigo Yes   Plan: Discussed diagnosis, treatment recommendations, usual course of symptoms, do not make any sudden motions, don't drive if too dizzy.  If not improving in the next 2-3 days with medication, call or return.

## 2013-07-14 ENCOUNTER — Other Ambulatory Visit: Payer: Self-pay | Admitting: Medical

## 2013-07-14 MED ORDER — LOSARTAN POTASSIUM 50 MG PO TABS: 50.0000 mg | ORAL_TABLET | Freq: Every day | ORAL | Status: DC

## 2013-07-14 NOTE — Telephone Encounter (Signed)
See last message, ok to switch to Cozaar

## 2013-07-14 NOTE — Telephone Encounter (Signed)
i switched to cozaar, but we can try switching back to benicar in a month

## 2013-07-14 NOTE — Telephone Encounter (Signed)
See msg

## 2013-07-20 ENCOUNTER — Other Ambulatory Visit: Payer: Self-pay | Admitting: Family Medicine

## 2013-07-20 ENCOUNTER — Telehealth: Payer: Self-pay | Admitting: Medical

## 2013-07-20 MED ORDER — BUDESONIDE-FORMOTEROL FUMARATE 160-4.5 MCG/ACT IN AERO: 2.0000 | INHALATION_SPRAY | Freq: Two times a day (BID) | RESPIRATORY_TRACT | Status: DC

## 2013-07-20 NOTE — Telephone Encounter (Signed)
Pt needs refill on symbicort sent to [facility] on [address]

## 2013-07-20 NOTE — Telephone Encounter (Signed)
Rx refill sent to the pharmacy. CLS 

## 2013-08-14 ENCOUNTER — Other Ambulatory Visit: Payer: Self-pay | Admitting: Internal Medicine

## 2013-08-17 ENCOUNTER — Encounter: Payer: Self-pay | Admitting: Internal Medicine

## 2013-09-07 ENCOUNTER — Ambulatory Visit (INDEPENDENT_AMBULATORY_CARE_PROVIDER_SITE_OTHER): Payer: BC Managed Care – PPO | Admitting: Medical

## 2013-09-07 ENCOUNTER — Encounter: Payer: Self-pay | Admitting: Medical

## 2013-09-07 VITALS — BP 142/96 | HR 72 | Temp 97.5°F | Resp 16 | Wt 186.0 lb

## 2013-09-07 DIAGNOSIS — N3941 Urge incontinence: Secondary | ICD-10-CM

## 2013-09-07 DIAGNOSIS — M2550 Pain in unspecified joint: Secondary | ICD-10-CM

## 2013-09-07 DIAGNOSIS — M898X9 Other specified disorders of bone, unspecified site: Secondary | ICD-10-CM

## 2013-09-07 DIAGNOSIS — M899 Disorder of bone, unspecified: Secondary | ICD-10-CM

## 2013-09-07 DIAGNOSIS — M949 Disorder of cartilage, unspecified: Secondary | ICD-10-CM

## 2013-09-07 DIAGNOSIS — Z789 Other specified health status: Secondary | ICD-10-CM

## 2013-09-07 DIAGNOSIS — R209 Unspecified disturbances of skin sensation: Secondary | ICD-10-CM

## 2013-09-07 DIAGNOSIS — R202 Paresthesia of skin: Secondary | ICD-10-CM

## 2013-09-07 DIAGNOSIS — E785 Hyperlipidemia, unspecified: Secondary | ICD-10-CM

## 2013-09-07 LAB — POCT URINALYSIS DIPSTICK
Bilirubin, UA: NEGATIVE
Glucose, UA: NEGATIVE
KETONES UA: NEGATIVE
Leukocytes, UA: NEGATIVE
Nitrite, UA: NEGATIVE
PH UA: 6
Protein, UA: NEGATIVE
RBC UA: NEGATIVE
Spec Grav, UA: 1.01
UROBILINOGEN UA: NEGATIVE

## 2013-09-07 LAB — RHEUMATOID FACTOR: Rhuematoid fact SerPl-aCnc: 10 IU/mL (ref <=14)

## 2013-09-07 LAB — LIPID PANEL
CHOLESTEROL: 216 mg/dL — AB (ref 0–200)
HDL: 61 mg/dL (ref >39)
LDL Cholesterol: 141 mg/dL — ABNORMAL HIGH (ref 0–99)
TRIGLYCERIDES: 69 mg/dL (ref <150)
Total CHOL/HDL Ratio: 3.5 Ratio
VLDL: 14 mg/dL (ref 0–40)

## 2013-09-07 LAB — URIC ACID: Uric Acid, Serum: 4.4 mg/dL (ref 2.4–7.0)

## 2013-09-07 LAB — CK: CK TOTAL: 90 U/L (ref 7–177)

## 2013-09-07 NOTE — Progress Notes (Signed)
   Subjective:   Shannon Mckenzie is a 53 y.o. female presenting on 09/07/2013 with burning in feet, legs and hands  Here for multiple complaints.  Hurting everywhere-back, feet, legs, hands, wrists, feels like bones hurt, muscles hurt.  Last time we discussed plantar fascitis.  Still having this feet pain too. She notes some recent burning pain in right lateral foot, but now has more burning in top of right foot and pain in ankles.   Hurts to walk.  Pains now go up thighs, legs.   Has burning pain in wrists.    Has low back pain, particularly in the morning.  Denies joint swelling.    Having incontinence, sometimes barely makes it to the bathroom.  Just saw gynecologist recently but wasn't having this problem them. Denies any history of cystocele were prolapse in the vulvar region. No burning with urination, no odor or color change with the urine. No concern for UTI    She notes feeling bloated, no energy, back pain, weight gain from not being able to exercise.  Has sinus pressure think she needs an antibiotic for sinus infection.    She saw her eye doctor recently, has cataracts but no glaucoma.  She is actually tolerating Zetia.  She has been taking this every other day, fasting today for cholesterol panel  No other complaint.  Review of Systems ROS as in subjective      Objective:    Filed Vitals:   09/07/13 0852  BP: 142/96  Pulse: 72  Temp: 97.5 F (36.4 C)  Resp: 16    General appearance: alert, no distress, WD/WN HEENT: normocephalic, sclerae anicteric, TMs pearly, nares patent, no discharge or erythema, pharynx normal Oral cavity: MMM, no lesions Neck: supple, no lymphadenopathy, no thyromegaly, no masses Heart: RRR, normal S1, S2, no murmurs Lungs: Decreased breath sounds, no wheezes, rhonchi, or rales Abdomen: +bs, soft, non tender, non distended, no masses, no hepatomegaly, no splenomegaly Back: Mild generalized lumbar tenderness, normal range of motion with  mild pain, no obvious scoliosis MSK: Legs and feet throughout nontender, normal range of motion, good flexibility, no obvious deformity Pulses: 2+ symmetric, upper and lower extremities, normal cap refill Extremities no edema, cyanosis or clubbing      Assessment: Encounter Diagnoses  Name Primary?  . Polyarthralgia Yes  . Urge incontinence   . Paresthesia   . Bone pain   . Hyperlipidemia   . Statin intolerance      Plan: Polyarthralgia, bone pain-rheumatoid lab screens today Urge incontinence- declines anticholinergic.  Advised Kegel exercises, more awareness of timing of fluid intake so she doesn't have to run to the bathroom on short notice, regular bathroom breaks, and if worsening, f/u with gyneoclogy Paresthesias-reviewed recent labs, we will check a B12 today Hyperlipidemia and statin intolerance-tolerating Zetia every other day, lipid panel today  Shannon Mckenzie was seen today for burning in feet, legs and hands.  Diagnoses and associated orders for this visit:  Polyarthralgia - Cyclic citrul peptide antibody, IgG - CK - Rheumatoid factor - Sedimentation rate - Uric acid  Urge incontinence  Paresthesia - Vitamin B12  Bone pain - Cyclic citrul peptide antibody, IgG - CK - Rheumatoid factor - Sedimentation rate - Uric acid  Hyperlipidemia - Lipid panel  Statin intolerance - Lipid panel     Return pending labs.

## 2013-09-07 NOTE — Addendum Note (Signed)
Addended by: Lilli LightLOMAX, WENDY G on: 09/07/2013 10:01 AM   Modules accepted: Orders

## 2013-09-07 NOTE — Patient Instructions (Signed)

## 2013-09-08 LAB — SEDIMENTATION RATE: SED RATE: 7 mm/h (ref 0–22)

## 2013-09-08 LAB — CYCLIC CITRUL PEPTIDE ANTIBODY, IGG

## 2013-09-08 LAB — VITAMIN B12: VITAMIN B 12: 498 pg/mL (ref 211–911)

## 2013-09-10 ENCOUNTER — Other Ambulatory Visit: Payer: Self-pay | Admitting: Medical

## 2013-11-06 ENCOUNTER — Other Ambulatory Visit: Payer: Self-pay | Admitting: Medical

## 2014-01-11 ENCOUNTER — Other Ambulatory Visit: Payer: Self-pay | Admitting: Medical

## 2014-01-14 ENCOUNTER — Other Ambulatory Visit: Payer: Self-pay | Admitting: Medical

## 2014-02-02 ENCOUNTER — Other Ambulatory Visit: Payer: Self-pay | Admitting: Medical

## 2014-02-20 ENCOUNTER — Other Ambulatory Visit: Payer: Self-pay | Admitting: Internal Medicine

## 2014-02-23 ENCOUNTER — Ambulatory Visit (INDEPENDENT_AMBULATORY_CARE_PROVIDER_SITE_OTHER): Payer: BC Managed Care – PPO | Admitting: Family Medicine

## 2014-02-23 ENCOUNTER — Encounter: Payer: Self-pay | Admitting: Family Medicine

## 2014-02-23 VITALS — BP 120/76 | HR 72 | Wt 183.0 lb

## 2014-02-23 DIAGNOSIS — Z23 Encounter for immunization: Secondary | ICD-10-CM

## 2014-02-23 DIAGNOSIS — S0083XA Contusion of other part of head, initial encounter: Secondary | ICD-10-CM

## 2014-02-23 DIAGNOSIS — S0003XA Contusion of scalp, initial encounter: Secondary | ICD-10-CM

## 2014-02-23 DIAGNOSIS — S1093XA Contusion of unspecified part of neck, initial encounter: Secondary | ICD-10-CM
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
## 2014-03-09 ENCOUNTER — Other Ambulatory Visit: Payer: Self-pay | Admitting: Medical

## 2014-03-27 ENCOUNTER — Other Ambulatory Visit: Payer: Self-pay | Admitting: Medical

## 2014-05-03 ENCOUNTER — Ambulatory Visit (INDEPENDENT_AMBULATORY_CARE_PROVIDER_SITE_OTHER): Payer: BC Managed Care – PPO | Admitting: Family

## 2014-05-03 ENCOUNTER — Other Ambulatory Visit (INDEPENDENT_AMBULATORY_CARE_PROVIDER_SITE_OTHER): Payer: BC Managed Care – PPO

## 2014-05-03 ENCOUNTER — Encounter: Payer: Self-pay | Admitting: Family

## 2014-05-03 ENCOUNTER — Telehealth: Payer: Self-pay | Admitting: Family

## 2014-05-03 VITALS — BP 148/94 | HR 60 | Temp 97.6°F | Resp 18 | Ht 62.0 in | Wt 185.1 lb

## 2014-05-03 DIAGNOSIS — R202 Paresthesia of skin: Secondary | ICD-10-CM

## 2014-05-03 DIAGNOSIS — R6889 Other general symptoms and signs: Secondary | ICD-10-CM

## 2014-05-03 DIAGNOSIS — R200 Anesthesia of skin: Secondary | ICD-10-CM

## 2014-05-03 LAB — BASIC METABOLIC PANEL
BUN: 13 mg/dL (ref 6–23)
CO2: 25 meq/L (ref 19–32)
CREATININE: 0.8 mg/dL (ref 0.4–1.2)
Calcium: 8.9 mg/dL (ref 8.4–10.5)
Chloride: 105 mEq/L (ref 96–112)
GFR: 86.29 mL/min (ref >60.00)
GLUCOSE: 76 mg/dL (ref 70–99)
Potassium: 4.1 mEq/L (ref 3.5–5.1)
Sodium: 139 mEq/L (ref 135–145)

## 2014-05-03 LAB — CBC
HCT: 35.8 % — ABNORMAL LOW (ref 36.0–46.0)
Hemoglobin: 11.7 g/dL — ABNORMAL LOW (ref 12.0–15.0)
MCHC: 32.7 g/dL (ref 30.0–36.0)
MCV: 80.8 fl (ref 78.0–100.0)
PLATELETS: 187 10*3/uL (ref 150.0–400.0)
RBC: 4.43 Mil/uL (ref 3.87–5.11)
RDW: 16 % — AB (ref 11.5–15.5)
WBC: 5.2 10*3/uL (ref 4.0–10.5)

## 2014-05-03 LAB — B12 AND FOLATE PANEL
Folate: 9.7 ng/mL (ref >5.9)
Vitamin B-12: 474 pg/mL (ref 211–911)

## 2014-05-03 LAB — IRON AND TIBC
%SAT: 9 % — ABNORMAL LOW (ref 20–55)
IRON: 34 ug/dL — AB (ref 42–145)
TIBC: 378 ug/dL (ref 250–470)
UIBC: 344 ug/dL (ref 125–400)

## 2014-05-03 LAB — FERRITIN: FERRITIN: 5.7 ng/mL — AB (ref 10.0–291.0)

## 2014-05-03 LAB — HEMOGLOBIN A1C: Hgb A1c MFr Bld: 5.2 % (ref 4.6–6.5)

## 2014-05-03 LAB — TSH: TSH: 2.45 u[IU]/mL (ref 0.35–4.50)

## 2014-05-03 NOTE — Assessment & Plan Note (Signed)
Symptoms and exam consistent with carpal tunnel in the upper extremity. Will obtain B12/Folate, BMET, CBC, A1c, Ferritin and TIBC to rule out metabolic causes of tingling. Discussed use of cock-up wrist splint for carpal tunnel as needed. Follow up based on lab results.

## 2014-05-03 NOTE — Progress Notes (Signed)
Pre visit review using our clinic review tool, if applicable. No additional management support is needed unless otherwise documented below in the visit note. 

## 2014-05-03 NOTE — Assessment & Plan Note (Signed)
Symptoms of temperature regulation most likely related to hormone fluctuations. Will obtain TSH to rule out thyroid. Follow up with GYN in December.

## 2014-05-03 NOTE — Telephone Encounter (Signed)
Please call the patient to inform her that her iron is low and I would like her to start taking ferrous sulfate 325 mg daily to assist. Otherwise, her labs are within the normal limits so we can rule out B12/folate, TSH, and diabetes as potential causes for the numbness. Her rheumatoid arthritis test also came back negative.

## 2014-05-03 NOTE — Patient Instructions (Addendum)
Thank you for choosing [facility].  Summary/Instructions:   Please stop by the lab prior to leaving for your blood work. We should have results in 24-48 hours  Schedule a time for a physical after January 6th.  Plan on follow up dependent upon lab results  Follow up with GYN in December.

## 2014-05-03 NOTE — Progress Notes (Signed)
Subjective:    Patient ID: Shannon Mckenzie, female    DOB: Jul 13, 1960, 53 y.o.   MRN: [id number]  Chief Complaint  Patient presents with  . Establish Care    been having sweats and cold spells.   HPI:  Shannon Mckenzie is a 53 y.o. female who presents to establish care and discuss. Seeing a counselor for her depression. Is being followed by GYN  1) Spells - Has experienced 3 separate spells being cold/waking up freezing. And then goes back to sleep and wakes up sweating. Was recently seen by GYN and they evaluated hormone levels which she does not recall the results. She has a follow up with GYN in December.  2) Neuropathy - feet burning and both arms have numbness and tingling. Indicates back pain. This has been going on for a long time. Feels like they were just changing medicines and not really finding out what's going on. Denies any attempted treatments or anything that makes it better or worse. States that when she talks on the phone her hands have a tendency to fall asleep.   Allergies  Allergen Reactions  . Statins     Myalgias with all statins  . Amoxicillin Hives and Rash   Current Outpatient Prescriptions on File Prior to Visit  Medication Sig Dispense Refill  . albuterol (VENTOLIN HFA) 108 (90 BASE) MCG/ACT inhaler Inhale 2 puffs into the lungs every 6 (six) hours as needed for wheezing. 1 Inhaler 1  . Calcium Carb-Cholecalciferol (CALCIUM + D3 PO) Take by mouth.    . esomeprazole (NEXIUM) 40 MG capsule Take 1 capsule (40 mg total) by mouth 2 (two) times daily. 90 capsule 1  . losartan (COZAAR) 50 MG tablet take 1 tablet by mouth once daily 30 tablet 1  . meclizine (ANTIVERT) 25 MG tablet Take 1 tablet (25 mg total) by mouth 3 (three) times daily as needed for dizziness. 30 tablet 0  . montelukast (SINGULAIR) 10 MG tablet take 1 tablet by mouth at bedtime 30 tablet 2  . Multiple Vitamins-Minerals (MULTIVITAMIN WITH MINERALS) tablet Take 1 tablet by mouth daily.    Marland Kitchen.  rOPINIRole (REQUIP) 0.5 MG tablet Take 1 tablet (0.5 mg total) by mouth at bedtime. 90 tablet 3  . sertraline (ZOLOFT) 100 MG tablet TAKE 1 1/2 TABLETS (150 MG) BY MOUTH AT BEDTIME 135 tablet 1  . SYMBICORT 160-4.5 MCG/ACT inhaler inhale 2 puffs by mouth twice a day 10.2 g 5  . traMADol (ULTRAM) 50 MG tablet TAKE ONE OR TWO TABLETS BY MOUTH EVERY FOUR HOURS AS NEEDED for cough or pain. 40 tablet 0  . triamcinolone (NASACORT) 55 MCG/ACT nasal inhaler Place 2 sprays into the nose daily.    Marland Kitchen. ZETIA 10 MG tablet take 1 tablet by mouth once daily 30 tablet 3   No current facility-administered medications on file prior to visit.    Past Medical History  Diagnosis Date  . GERD (gastroesophageal reflux disease)   . Allergy   . Hyperlipidemia   . Anxiety   . Depression   . DJD (degenerative joint disease) of hip   . Former smoker   . Routine gynecological examination     sees gynecology, [name] NP  . Hiatal hernia   . Vision problems     wears contact lenses  . Hypertension   . Reactive airway disease   . RLS (restless legs syndrome)   . Statin intolerance   . SOB (shortness of breath) 9/14    cardiac  eval with stress test and echo; Dr. Viann FishSpencer Tilley  . Kidney stone   . Asthma     Review of Systems    See HPI  Objective:    BP 148/94 mmHg  Pulse 60  Temp(Src) 97.6 F (36.4 C) (Oral)  Resp 18  Ht 5\' 2"  (1.575 m)  Wt 185 lb 1.9 oz (83.97 kg)  BMI 33.85 kg/m2  SpO2 97% Nursing note and vital signs reviewed.  Physical Exam  Constitutional: She is oriented to person, place, and time. She appears well-developed and well-nourished. No distress.  Cardiovascular: Normal rate, regular rhythm, normal heart sounds and intact distal pulses.   Pulmonary/Chest: Effort normal and breath sounds normal.  Musculoskeletal:  No obvious deformity, discoloration or edema noted of spine, upper or lower extremities. Pulse intact and appropriate. No palpable tenderness of cervical spine or  upper or lower extremities. (+) Phalen's test for carpal tunnel.   Neurological: She is alert and oriented to person, place, and time. She has normal reflexes. No cranial nerve deficit. Coordination normal.  Skin: Skin is warm and dry.  Psychiatric: She has a normal mood and affect. Her behavior is normal. Judgment and thought content normal.       Assessment & Plan:

## 2014-05-04 LAB — ANA: Anti Nuclear Antibody(ANA): NEGATIVE

## 2014-05-04 NOTE — Telephone Encounter (Signed)
Let pt know that her iron was low and to take ferrous sulfate 325 mg daily to help. Also let her know that the rest of her labs were normal.

## 2014-05-10 ENCOUNTER — Other Ambulatory Visit: Payer: Self-pay | Admitting: Medical

## 2014-05-25 ENCOUNTER — Other Ambulatory Visit: Payer: Self-pay | Admitting: Internal Medicine

## 2014-05-30 ENCOUNTER — Telehealth: Payer: Self-pay | Admitting: Family

## 2014-05-30 MED ORDER — LOSARTAN POTASSIUM 50 MG PO TABS: 50.0000 mg | ORAL_TABLET | Freq: Every day | ORAL | Status: DC

## 2014-05-30 NOTE — Telephone Encounter (Signed)
Rx sent 

## 2014-05-30 NOTE — Telephone Encounter (Signed)
Pt request refill for losartan 50mg  to be [facility] to [facility] on [address]. Please give pt a call if this is ok.

## 2014-05-31 ENCOUNTER — Other Ambulatory Visit: Payer: Self-pay

## 2014-05-31 MED ORDER — LOSARTAN POTASSIUM 50 MG PO TABS: 50.0000 mg | ORAL_TABLET | Freq: Every day | ORAL | Status: DC

## 2014-05-31 MED ORDER — MONTELUKAST SODIUM 10 MG PO TABS: 10.0000 mg | ORAL_TABLET | Freq: Every day | ORAL | Status: DC

## 2014-06-09 ENCOUNTER — Other Ambulatory Visit: Payer: Self-pay | Admitting: Medical

## 2014-07-06 ENCOUNTER — Ambulatory Visit (INDEPENDENT_AMBULATORY_CARE_PROVIDER_SITE_OTHER): Payer: BLUE CROSS/BLUE SHIELD | Admitting: Family

## 2014-07-06 ENCOUNTER — Encounter: Payer: Self-pay | Admitting: Family

## 2014-07-06 ENCOUNTER — Other Ambulatory Visit (INDEPENDENT_AMBULATORY_CARE_PROVIDER_SITE_OTHER): Payer: BLUE CROSS/BLUE SHIELD

## 2014-07-06 VITALS — BP 132/82 | HR 69 | Temp 97.8°F | Resp 18 | Ht 62.0 in | Wt 174.1 lb

## 2014-07-06 DIAGNOSIS — Z23 Encounter for immunization: Secondary | ICD-10-CM

## 2014-07-06 DIAGNOSIS — Z0001 Encounter for general adult medical examination with abnormal findings: Secondary | ICD-10-CM | POA: Insufficient documentation

## 2014-07-06 DIAGNOSIS — Z0000 Encounter for general adult medical examination without abnormal findings: Secondary | ICD-10-CM

## 2014-07-06 LAB — BASIC METABOLIC PANEL
BUN: 20 mg/dL (ref 6–23)
CHLORIDE: 106 meq/L (ref 96–112)
CO2: 27 mEq/L (ref 19–32)
Calcium: 9.1 mg/dL (ref 8.4–10.5)
Creatinine, Ser: 0.64 mg/dL (ref 0.40–1.20)
GFR: 103.55 mL/min (ref >60.00)
GLUCOSE: 89 mg/dL (ref 70–99)
POTASSIUM: 4 meq/L (ref 3.5–5.1)
Sodium: 139 mEq/L (ref 135–145)

## 2014-07-06 LAB — LIPID PANEL
CHOL/HDL RATIO: 4
Cholesterol: 216 mg/dL — ABNORMAL HIGH (ref 0–200)
HDL: 58.2 mg/dL (ref >39.00)
LDL Cholesterol: 146 mg/dL — ABNORMAL HIGH (ref 0–99)
NONHDL: 157.8
Triglycerides: 60 mg/dL (ref 0.0–149.0)
VLDL: 12 mg/dL (ref 0.0–40.0)

## 2014-07-06 LAB — CBC
HEMATOCRIT: 40.5 % (ref 36.0–46.0)
HEMOGLOBIN: 13.6 g/dL (ref 12.0–15.0)
MCHC: 33.5 g/dL (ref 30.0–36.0)
MCV: 83.9 fl (ref 78.0–100.0)
Platelets: 186 10*3/uL (ref 150.0–400.0)
RBC: 4.82 Mil/uL (ref 3.87–5.11)
RDW: 16.9 % — ABNORMAL HIGH (ref 11.5–15.5)
WBC: 5.9 10*3/uL (ref 4.0–10.5)

## 2014-07-06 MED ORDER — EZETIMIBE 10 MG PO TABS: 10.0000 mg | ORAL_TABLET | Freq: Every day | ORAL | Status: DC

## 2014-07-06 MED ORDER — ALBUTEROL SULFATE HFA 108 (90 BASE) MCG/ACT IN AERS: 2.0000 | INHALATION_SPRAY | Freq: Four times a day (QID) | RESPIRATORY_TRACT | Status: DC | PRN

## 2014-07-06 MED ORDER — SERTRALINE HCL 100 MG PO TABS: ORAL_TABLET | ORAL | Status: DC

## 2014-07-06 MED ORDER — ESOMEPRAZOLE MAGNESIUM 40 MG PO CPDR: 40.0000 mg | DELAYED_RELEASE_CAPSULE | Freq: Two times a day (BID) | ORAL | Status: DC

## 2014-07-06 MED ORDER — MONTELUKAST SODIUM 10 MG PO TABS: 10.0000 mg | ORAL_TABLET | Freq: Every day | ORAL | Status: DC

## 2014-07-06 MED ORDER — BUDESONIDE-FORMOTEROL FUMARATE 160-4.5 MCG/ACT IN AERO: 2.0000 | INHALATION_SPRAY | Freq: Two times a day (BID) | RESPIRATORY_TRACT | Status: DC

## 2014-07-06 MED ORDER — LOSARTAN POTASSIUM 50 MG PO TABS: 50.0000 mg | ORAL_TABLET | Freq: Every day | ORAL | Status: DC

## 2014-07-06 NOTE — Progress Notes (Signed)
Pre visit review using our clinic review tool, if applicable. No additional management support is needed unless otherwise documented below in the visit note. 

## 2014-07-06 NOTE — Assessment & Plan Note (Addendum)
1) Anticipatory Guidance: Discussed importance of wearing a seatbelt while driving and not texting while driving; changing batteries in smoke detector at least once annually; wearing suntan lotion when outside; eating a balanced and moderate diet; getting physical activity at least 30 minutes per day.  2) Immunizations / Screenings / Labs:  Tetanus shot given today. All other immunizations are up-to-date per recommendations. All screenings are up-to-date per recommendations. Obtain CBC, BMET, and Lipid profile.   Overall well exam. Patient reports improved health and increased energy since starting the iron. Continue current healthy lifestyle trends. Current risk factors remain hyperlipidemia and obesity. Patient is made positive progress losing 9 pounds. Continue to eat a nutrient dense diet with fruits and vegetables and limit saturated fats and processed foods. Follow up prevention exam in 1 year or sooner if needed.

## 2014-07-06 NOTE — Patient Instructions (Addendum)
Thank you for choosing [facility].  Summary/Instructions:  Your prescription(s) have been submitted to your pharmacy or been printed and provided for you. Please take as directed and contact our office if you believe you are having problem(s) with the medication(s) or have any questions.  Try Glucosamine and  Chondroitin for your knees.      Health Maintenance Adopting a healthy lifestyle and getting preventive care can go a long way to promote health and wellness. Talk with your health care provider about what schedule of regular examinations is right for you. This is a good chance for you to check in with your provider about disease prevention and staying healthy. In between checkups, there are plenty of things you can do on your own. Experts have done a lot of research about which lifestyle changes and preventive measures are most likely to keep you healthy. Ask your health care provider for more information. WEIGHT AND DIET  Eat a healthy diet 1. Be sure to include plenty of vegetables, fruits, low-fat dairy products, and lean protein. 2. Do not eat a lot of foods high in solid fats, added sugars, or salt. 3. Get regular exercise. This is one of the most important things you can do for your health. 1. Most adults should exercise for at least 150 minutes each week. The exercise should increase your heart rate and make you sweat (moderate-intensity exercise). 2. Most adults should also do strengthening exercises at least twice a week. This is in addition to the moderate-intensity exercise.  Maintain a healthy weight 1. Body mass index (BMI) is a measurement that can be used to identify possible weight problems. It estimates body fat based on height and weight. Your health care provider can help determine your BMI and help you achieve or maintain a healthy weight. 2. For females 62 years of age and older:  1. A BMI below 18.5 is considered underweight. 2. A BMI of 18.5 to 24.9 is  normal. 3. A BMI of 25 to 29.9 is considered overweight. 4. A BMI of 30 and above is considered obese.  Watch levels of cholesterol and blood lipids 1. You should start having your blood tested for lipids and cholesterol at 53 years of age, then have this test every 5 years. 2. You may need to have your cholesterol levels checked more often if: 1. Your lipid or cholesterol levels are high. 2. You are older than 53 years of age. 3. You are at high risk for heart disease.  CANCER SCREENING   Lung Cancer 1. Lung cancer screening is recommended for adults 3-37 years old who are at high risk for lung cancer because of a history of smoking. 2. A yearly low-dose CT scan of the lungs is recommended for people who: 1. Currently smoke. 2. Have quit within the past 15 years. 3. Have at least a 30-pack-year history of smoking. A pack year is smoking an average of one pack of cigarettes a day for 1 year. 3. Yearly screening should continue until it has been 15 years since you quit. 4. Yearly screening should stop if you develop a health problem that would prevent you from having lung cancer treatment.  Breast Cancer  Practice breast self-awareness. This means understanding how your breasts normally appear and feel.  It also means doing regular breast self-exams. Let your health care provider know about any changes, no matter how small.  If you are in your 20s or 30s, you should have a clinical breast exam (  CBE) by a health care provider every 1-3 years as part of a regular health exam.  If you are 68 or older, have a CBE every year. Also consider having a breast X-ray (mammogram) every year.  If you have a family history of breast cancer, talk to your health care provider about genetic screening.  If you are at high risk for breast cancer, talk to your health care provider about having an MRI and a mammogram every year.  Breast cancer gene (BRCA) assessment is recommended for women who have  family members with BRCA-related cancers. BRCA-related cancers include:  Breast.  Ovarian.  Tubal.  Peritoneal cancers.  Results of the assessment will determine the need for genetic counseling and BRCA1 and BRCA2 testing. Cervical Cancer Routine pelvic examinations to screen for cervical cancer are no longer recommended for nonpregnant women who are considered low risk for cancer of the pelvic organs (ovaries, uterus, and vagina) and who do not have symptoms. A pelvic examination may be necessary if you have symptoms including those associated with pelvic infections. Ask your health care provider if a screening pelvic exam is right for you.   The Pap test is the screening test for cervical cancer for women who are considered at risk.  If you had a hysterectomy for a problem that was not cancer or a condition that could lead to cancer, then you no longer need Pap tests.  If you are older than 65 years, and you have had normal Pap tests for the past 10 years, you no longer need to have Pap tests.  If you have had past treatment for cervical cancer or a condition that could lead to cancer, you need Pap tests and screening for cancer for at least 20 years after your treatment.  If you no longer get a Pap test, assess your risk factors if they change (such as having a new sexual partner). This can affect whether you should start being screened again.  Some women have medical problems that increase their chance of getting cervical cancer. If this is the case for you, your health care provider may recommend more frequent screening and Pap tests.  The human papillomavirus (HPV) test is another test that may be used for cervical cancer screening. The HPV test looks for the virus that can cause cell changes in the cervix. The cells collected during the Pap test can be tested for HPV.  The HPV test can be used to screen women 78 years of age and older. Getting tested for HPV can extend the interval  between normal Pap tests from three to five years.  An HPV test also should be used to screen women of any age who have unclear Pap test results.  After 53 years of age, women should have HPV testing as often as Pap tests.  Colorectal Cancer  This type of cancer can be detected and often prevented.  Routine colorectal cancer screening usually begins at 53 years of age and continues through 53 years of age.  Your health care provider may recommend screening at an earlier age if you have risk factors for colon cancer.  Your health care provider may also recommend using home test kits to check for hidden blood in the stool.  A small camera at the end of a tube can be used to examine your colon directly (sigmoidoscopy or colonoscopy). This is done to check for the earliest forms of colorectal cancer.  Routine screening usually begins at age 32.  Direct examination of the colon should be repeated every 5-10 years through 54 years of age. However, you may need to be screened more often if early forms of precancerous polyps or small growths are found. Skin Cancer  Check your skin from head to toe regularly.  Tell your health care provider about any new moles or changes in moles, especially if there is a change in a mole's shape or color.  Also tell your health care provider if you have a mole that is larger than the size of a pencil eraser.  Always use sunscreen. Apply sunscreen liberally and repeatedly throughout the day.  Protect yourself by wearing long sleeves, pants, a wide-brimmed hat, and sunglasses whenever you are outside. HEART DISEASE, DIABETES, AND HIGH BLOOD PRESSURE   Have your blood pressure checked at least every 1-2 years. High blood pressure causes heart disease and increases the risk of stroke.  If you are between 63 years and 54 years old, ask your health care provider if you should take aspirin to prevent strokes.  Have regular diabetes screenings. This involves  taking a blood sample to check your fasting blood sugar level.  If you are at a normal weight and have a low risk for diabetes, have this test once every three years after 53 years of age.  If you are overweight and have a high risk for diabetes, consider being tested at a younger age or more often. PREVENTING INFECTION  Hepatitis B  If you have a higher risk for hepatitis B, you should be screened for this virus. You are considered at high risk for hepatitis B if:  You were born in a country where hepatitis B is common. Ask your health care provider which countries are considered high risk.  Your parents were born in a high-risk country, and you have not been immunized against hepatitis B (hepatitis B vaccine).  You have HIV or AIDS.  You use needles to inject street drugs.  You live with someone who has hepatitis B.  You have had sex with someone who has hepatitis B.  You get hemodialysis treatment.  You take certain medicines for conditions, including cancer, organ transplantation, and autoimmune conditions. Hepatitis C  Blood testing is recommended for:  Everyone born from [birth year] through 1964.  Anyone with known risk factors for hepatitis C. Sexually transmitted infections (STIs)  You should be screened for sexually transmitted infections (STIs) including gonorrhea and chlamydia if:  You are sexually active and are younger than 53 years of age.  You are older than 53 years of age and your health care provider tells you that you are at risk for this type of infection.  Your sexual activity has changed since you were last screened and you are at an increased risk for chlamydia or gonorrhea. Ask your health care provider if you are at risk.  If you do not have HIV, but are at risk, it may be recommended that you take a prescription medicine daily to prevent HIV infection. This is called pre-exposure prophylaxis (PrEP). You are considered at risk if:  You are sexually active  and do not regularly use condoms or know the HIV status of your partner(s).  You take drugs by injection.  You are sexually active with a partner who has HIV. Talk with your health care provider about whether you are at high risk of being infected with HIV. If you choose to begin PrEP, you should first be tested for HIV. You should then be  tested every 3 months for as long as you are taking PrEP.  PREGNANCY   If you are premenopausal and you may become pregnant, ask your health care provider about preconception counseling.  If you may become pregnant, take 400 to 800 micrograms (mcg) of folic acid every day.  If you want to prevent pregnancy, talk to your health care provider about birth control (contraception). OSTEOPOROSIS AND MENOPAUSE   Osteoporosis is a disease in which the bones lose minerals and strength with aging. This can result in serious bone fractures. Your risk for osteoporosis can be identified using a bone density scan.  If you are 22 years of age or older, or if you are at risk for osteoporosis and fractures, ask your health care provider if you should be screened.  Ask your health care provider whether you should take a calcium or vitamin D supplement to lower your risk for osteoporosis.  Menopause may have certain physical symptoms and risks.  Hormone replacement therapy may reduce some of these symptoms and risks. Talk to your health care provider about whether hormone replacement therapy is right for you.  HOME CARE INSTRUCTIONS   Schedule regular health, dental, and eye exams.  Stay current with your immunizations.   Do not use any tobacco products including cigarettes, chewing tobacco, or electronic cigarettes.  If you are pregnant, do not drink alcohol.  If you are breastfeeding, limit how much and how often you drink alcohol.  Limit alcohol intake to no more than 1 drink per day for nonpregnant women. One drink equals 12 ounces of beer, 5 ounces of wine,  or 1 ounces of hard liquor.  Do not use street drugs.  Do not share needles.  Ask your health care provider for help if you need support or information about quitting drugs.  Tell your health care provider if you often feel depressed.  Tell your health care provider if you have ever been abused or do not feel safe at home. Document Released: 12/24/2010 Document Revised: 10/25/2013 Document Reviewed: 05/12/2013 [facility] Patient Information 2015 [place], [place]. This information is not intended to replace advice given to you by your health care provider. Make sure you discuss any questions you have with your health care provider.  American Heart Association (AHA) Exercise Recommendation  Being physically active is important to prevent heart disease and stroke, the nation's No. 1and No. 5killers. To improve overall cardiovascular health, we suggest at least 150 minutes per week of moderate exercise or 75 minutes per week of vigorous exercise (or a combination of moderate and vigorous activity). Thirty minutes a day, five times a week is an easy goal to remember. You will also experience benefits even if you divide your time into two or three segments of 10 to 15 minutes per day.  For people who would benefit from lowering their blood pressure or cholesterol, we recommend 40 minutes of aerobic exercise of moderate to vigorous intensity three to four times a week to lower the risk for heart attack and stroke.  Physical activity is anything that makes you move your body and burn calories.  This includes things like climbing stairs or playing sports. Aerobic exercises benefit your heart, and include walking, jogging, swimming or biking. Strength and stretching exercises are best for overall stamina and flexibility.  The simplest, positive change you can make to effectively improve your heart health is to start walking. It's enjoyable, free, easy, social and great exercise. A walking program is  flexible and boasts  high success rates because people can stick with it. It's easy for walking to become a regular and satisfying part of life.   For Overall Cardiovascular Health:  At least 30 minutes of moderate-intensity aerobic activity at least 5 days per week for a total of 150  OR   At least 25 minutes of vigorous aerobic activity at least 3 days per week for a total of 75 minutes; or a combination of moderate- and vigorous-intensity aerobic activity  AND   Moderate- to high-intensity muscle-strengthening activity at least 2 days per week for additional health benefits.  For Lowering Blood Pressure and Cholesterol  An average 40 minutes of moderate- to vigorous-intensity aerobic activity 3 or 4 times per week  What if I can't make it to the time goal? Something is always better than nothing! And everyone has to start somewhere. Even if you've been sedentary for years, today is the day you can begin to make healthy changes in your life. If you don't think you'll make it for 30 or 40 minutes, set a reachable goal for today. You can work up toward your overall goal by increasing your time as you get stronger. Don't let all-or-nothing thinking rob you of doing what you can every day.  Source:http://www.heart.org

## 2014-07-06 NOTE — Progress Notes (Signed)
Subjective:    Patient ID: Shannon Mckenzie, female    DOB: 1961/06/05, 54 y.o.   MRN: [id number]  Chief Complaint  Patient presents with  . CPE    Fasting    HPI:  Shannon Mckenzie is a 54 y.o. female who presents today for an annual wellness visit.   1) Health Maintenance - Indicates she is feeling good with a lot of energy.   Diet - Eats 2-3 meals per day; consistency is fairly typical American diet with occasional fruits, vegetables and salad.   Exercise - Been work; Indicates she is up and doing stuff around the house; No structured exercise plan  2) Preventative Exams / Immunizations:  Dental -- Up to date Vision -- Up to date   Health Maintenance  Topic Date Due  . INFLUENZA VACCINE  01/23/2015  . TETANUS/TDAP  02/17/2015  . MAMMOGRAM  06/14/2016  . PAP SMEAR  07/28/2016  . COLONOSCOPY  07/24/2022  Tetanus    Immunization History  Administered Date(s) Administered  . Influenza Split 07/06/2011, 03/20/2012  . Influenza Whole 04/02/2001, 04/23/2002  . Influenza,inj,Quad PF,36+ Mos 03/19/2013, 02/23/2014  . Pneumococcal Polysaccharide-23 03/20/2012  . Td 02/16/2005   Allergies  Allergen Reactions  . Statins     Myalgias with all statins  . Amoxicillin Hives and Rash    Current Outpatient Prescriptions on File Prior to Visit  Medication Sig Dispense Refill  . Calcium Carb-Cholecalciferol (CALCIUM + D3 PO) Take by mouth.    . meclizine (ANTIVERT) 25 MG tablet Take 1 tablet (25 mg total) by mouth 3 (three) times daily as needed for dizziness. 30 tablet 0  . Multiple Vitamins-Minerals (MULTIVITAMIN WITH MINERALS) tablet Take 1 tablet by mouth daily.    Marland Kitchen. rOPINIRole (REQUIP) 0.5 MG tablet Take 1 tablet (0.5 mg total) by mouth at bedtime. 90 tablet 3  . traMADol (ULTRAM) 50 MG tablet TAKE ONE OR TWO TABLETS BY MOUTH EVERY FOUR HOURS AS NEEDED for cough or pain. 40 tablet 0  . triamcinolone (NASACORT) 55 MCG/ACT nasal inhaler Place 2 sprays into the nose  daily.     No current facility-administered medications on file prior to visit.    Past Medical History  Diagnosis Date  . GERD (gastroesophageal reflux disease)   . Allergy   . Hyperlipidemia   . Anxiety   . Depression   . DJD (degenerative joint disease) of hip   . Former smoker   . Routine gynecological examination     sees gynecology, [name] NP  . Hiatal hernia   . Vision problems     wears contact lenses  . Hypertension   . Reactive airway disease   . RLS (restless legs syndrome)   . Statin intolerance   . SOB (shortness of breath) 9/14    cardiac eval with stress test and echo; Dr. Viann FishSpencer Tilley  . Kidney stone   . Asthma     Past Surgical History  Procedure Laterality Date  . Colonoscopy  06/2012    Dr. Ramon DredgeEdward; repeat 10 years  . Upper gi endoscopy N/A 07/24/2012    [place] Hernia,normal stomach, normal examined duodenum    Family History  Problem Relation Age of Onset  . Arthritis Mother   . Asthma Mother   . Diabetes Mother   . Cirrhosis Mother     died of liver nonalcoholic cirrhosis  . COPD Mother   . Heart disease Mother     CHF  . Asthma Sister   . Cancer  Father     died of pancreatic cancer  . Depression Brother   . Stroke Maternal Aunt     died age 86yo of CVA  . Other Brother     died of MVA    History   Social History  . Marital Status: Married    Spouse Name: N/A    Number of Children: 1  . Years of Education: 9   Occupational History  . Housekeeping    Social History Main Topics  . Smoking status: Former Smoker -- 1.00 packs/day for 15 years    Types: Cigarettes    Quit date: 06/25/1991  . Smokeless tobacco: Never Used  . Alcohol Use: No  . Drug Use: No  . Sexual Activity: Not Currently   Other Topics Concern  . Not on file   Social History Narrative   Married, 1 son, no exercise other than on the job, [employer]   Fun: Watch movies, be outside and work in the yard, photography   Denies any religious beliefs that  would effect health care.     Review of Systems  Constitutional: Denies fever, chills, fatigue, or significant weight gain/loss. HENT: Head: Denies headache or neck pain Ears: Denies changes in hearing, ringing in ears, earache, drainage Nose: Denies discharge, stuffiness, itching, nosebleed, sinus pain Throat: Denies sore throat, hoarseness, dry mouth, sores, thrush Eyes: Denies loss/changes in vision, pain, redness, blurry/double vision, flashing lights Cardiovascular: Denies chest pain/discomfort, tightness, palpitations, shortness of breath with activity, difficulty lying down, swelling, sudden awakening with shortness of breath Respiratory: Denies shortness of breath, cough, sputum production, wheezing Gastrointestinal: Denies dysphasia, heartburn, change in appetite, nausea, change in bowel habits, rectal bleeding, constipation, diarrhea, yellow skin or eyes Genitourinary: Denies frequency, urgency, burning/pain, blood in urine, incontinence, change in urinary strength. Musculoskeletal: Denies muscle/joint pain (other than described below), stiffness, back pain, redness or swelling of joints, trauma Shoulder pain which is treated with OTC advil.  Skin: Denies rashes, lumps, itching, dryness, color changes, or hair/nail changes Neurological: Denies dizziness, fainting, seizures, weakness, numbness, tingling, tremor Psychiatric - Denies nervousness, stress, depression or memory loss Endocrine: Denies heat or cold intolerance, sweating, frequent urination, excessive thirst, changes in appetite Hematologic: Denies ease of bruising or bleeding     Objective:    BP 132/82 mmHg  Pulse 69  Temp(Src) 97.8 F (36.6 C) (Oral)  Resp 18  Ht  (1.575 m)  Wt 174 lb 1.9 oz (78.98 kg)  BMI 31.84 kg/m2  SpO2 97% Nursing note and vital signs reviewed.  Physical Exam  Constitutional: She is oriented to person, place, and time. She appears well-developed and well-nourished.  HENT:  Head:  Normocephalic.  Right Ear: Hearing, tympanic membrane, external ear and ear canal normal.  Left Ear: Hearing, tympanic membrane, external ear and ear canal normal.  Nose: Nose normal.  Mouth/Throat: Uvula is midline, oropharynx is clear and moist and mucous membranes are normal.  Eyes: Conjunctivae and EOM are normal. Pupils are equal, round, and reactive to light.  Neck: Neck supple. No JVD present. No tracheal deviation present. No thyromegaly present.  Cardiovascular: Normal rate, regular rhythm, normal heart sounds and intact distal pulses.   Pulmonary/Chest: Effort normal and breath sounds normal.  Abdominal: Soft. Bowel sounds are normal. She exhibits no distension and no mass. There is no tenderness. There is no rebound and no guarding.  Musculoskeletal: Normal range of motion. She exhibits no edema or tenderness.  Lymphadenopathy:    She has no cervical adenopathy.  Neurological: She is alert and oriented to person, place, and time. She has normal reflexes. No cranial nerve deficit. She exhibits normal muscle tone. Coordination normal.  Skin: Skin is warm and dry.  Psychiatric: She has a normal mood and affect. Her behavior is normal. Judgment and thought content normal.       Assessment & Plan:

## 2014-07-07 ENCOUNTER — Encounter: Payer: Self-pay | Admitting: Family

## 2014-07-09 ENCOUNTER — Other Ambulatory Visit: Payer: Self-pay | Admitting: Medical

## 2014-07-11 NOTE — Telephone Encounter (Signed)
Medication refilled

## 2014-07-11 NOTE — Telephone Encounter (Signed)
Is this okay?

## 2014-07-21 ENCOUNTER — Encounter: Payer: Self-pay | Admitting: Family

## 2014-07-21 ENCOUNTER — Ambulatory Visit (INDEPENDENT_AMBULATORY_CARE_PROVIDER_SITE_OTHER): Payer: BLUE CROSS/BLUE SHIELD | Admitting: Family

## 2014-07-21 VITALS — BP 130/90 | HR 69 | Temp 97.7°F | Resp 18 | Wt 179.4 lb

## 2014-07-21 DIAGNOSIS — R21 Rash and other nonspecific skin eruption: Secondary | ICD-10-CM

## 2014-07-21 MED ORDER — METHYLPREDNISOLONE (PAK) 4 MG PO TABS
ORAL_TABLET | ORAL | Status: DC
Start: 2014-07-21 — End: 2014-08-26

## 2014-07-21 MED ORDER — TRIAMCINOLONE ACETONIDE 0.025 % EX OINT: 1.0000 "application " | TOPICAL_OINTMENT | Freq: Two times a day (BID) | CUTANEOUS | Status: DC

## 2014-07-21 NOTE — Assessment & Plan Note (Signed)
Small papular rash consistent with dermatitis noted. Start Medrol dose pack and Triamcinolone ointment. Follow up if symptoms worsen or fail to improve.

## 2014-07-21 NOTE — Progress Notes (Signed)
Pre visit review using our clinic review tool, if applicable. No additional management support is needed unless otherwise documented below in the visit note. 

## 2014-07-21 NOTE — Progress Notes (Signed)
   Subjective:    Patient ID: Shannon Mckenzie, female    DOB: 11/08/1960, 54 y.o.   MRN: [id number]  Chief Complaint  Patient presents with  . Rash    itching and red, when scratching it hurts and burns, x3 days    HPI:  Shannon Mckenzie is a 54 y.o. female who presents today for an acute visit.  Associated symptoms of a red, itchy rash that is described as burns has been going on for 3 days. Has not tried any over the counter treatment.   Allergies  Allergen Reactions  . Statins     Myalgias with all statins  . Amoxicillin Hives and Rash    Current Outpatient Prescriptions on File Prior to Visit  Medication Sig Dispense Refill  . albuterol (VENTOLIN HFA) 108 (90 BASE) MCG/ACT inhaler Inhale 2 puffs into the lungs every 6 (six) hours as needed for wheezing. 1 Inhaler 1  . budesonide-formoterol (SYMBICORT) 160-4.5 MCG/ACT inhaler Inhale 2 puffs into the lungs 2 (two) times daily. 10.2 g 5  . Calcium Carb-Cholecalciferol (CALCIUM + D3 PO) Take by mouth.    . esomeprazole (NEXIUM) 40 MG capsule Take 1 capsule (40 mg total) by mouth 2 (two) times daily. 180 capsule 3  . ezetimibe (ZETIA) 10 MG tablet Take 1 tablet (10 mg total) by mouth daily. 90 tablet 3  . ferrous fumarate (HEMOCYTE - 106 MG FE) 325 (106 FE) MG TABS tablet Take 1 tablet by mouth.    . losartan (COZAAR) 50 MG tablet Take 1 tablet (50 mg total) by mouth daily. 90 tablet 3  . meclizine (ANTIVERT) 25 MG tablet Take 1 tablet (25 mg total) by mouth 3 (three) times daily as needed for dizziness. 30 tablet 0  . montelukast (SINGULAIR) 10 MG tablet Take 1 tablet (10 mg total) by mouth at bedtime. 30 tablet 2  . Multiple Vitamins-Minerals (MULTIVITAMIN WITH MINERALS) tablet Take 1 tablet by mouth daily.    Marland Kitchen. rOPINIRole (REQUIP) 0.5 MG tablet Take 1 tablet (0.5 mg total) by mouth at bedtime. 90 tablet 3  . sertraline (ZOLOFT) 100 MG tablet take 1 and 1/2 tablets by mouth at bedtime 135 tablet 1  . traMADol (ULTRAM) 50 MG  tablet TAKE ONE OR TWO TABLETS BY MOUTH EVERY FOUR HOURS AS NEEDED for cough or pain. 40 tablet 0  . triamcinolone (NASACORT) 55 MCG/ACT nasal inhaler Place 2 sprays into the nose daily.     No current facility-administered medications on file prior to visit.    Review of Systems  Skin: Positive for rash.      Objective:    BP 130/90 mmHg  Pulse 69  Temp(Src) 97.7 F (36.5 C) (Oral)  Resp 18  Wt 179 lb 6.4 oz (81.375 kg)  SpO2 96% Nursing note and vital signs reviewed.  Physical Exam  Constitutional: She is oriented to person, place, and time. She appears well-developed and well-nourished. No distress.  Cardiovascular: Normal rate, regular rhythm, normal heart sounds and intact distal pulses.   Pulmonary/Chest: Effort normal and breath sounds normal.  Neurological: She is alert and oriented to person, place, and time.  Skin: Skin is warm and dry.  Small papular, blanchable rash noted right thoracic spine.  Psychiatric: She has a normal mood and affect. Her behavior is normal. Judgment and thought content normal.       Assessment & Plan:

## 2014-07-21 NOTE — Patient Instructions (Addendum)
Thank you for choosing [facility].  Summary/Instructions:  Your prescription(s) have been submitted to your pharmacy or been printed and provided for you. Please take as directed and contact our office if you believe you are having problem(s) with the medication(s) or have any questions.  If your symptoms worsen or fail to improve, please contact our office for further instruction, or in case of emergency go directly to the emergency room at the closest medical facility.     

## 2014-08-26 ENCOUNTER — Ambulatory Visit (INDEPENDENT_AMBULATORY_CARE_PROVIDER_SITE_OTHER): Payer: BLUE CROSS/BLUE SHIELD | Admitting: Internal Medicine

## 2014-08-26 ENCOUNTER — Encounter: Payer: Self-pay | Admitting: Internal Medicine

## 2014-08-26 VITALS — BP 120/84 | HR 87 | Temp 98.5°F | Ht 62.0 in | Wt 179.5 lb

## 2014-08-26 DIAGNOSIS — J310 Chronic rhinitis: Secondary | ICD-10-CM

## 2014-08-26 DIAGNOSIS — J209 Acute bronchitis, unspecified: Secondary | ICD-10-CM

## 2014-08-26 MED ORDER — AZITHROMYCIN 250 MG PO TABS: ORAL_TABLET | ORAL | Status: DC

## 2014-08-26 NOTE — Patient Instructions (Signed)
Plain Mucinex (NOT D) for thick secretions ;force NON dairy fluids .   Nasal cleansing in the shower as discussed with lather of mild shampoo.After 10 seconds wash off lather while  exhaling through nostrils. Make sure that all residual soap is removed to prevent irritation.  Flonase OR Nasacort AQ 1 spray in each nostril twice a day as needed. Use the "crossover" technique into opposite nostril spraying toward opposite ear @ 45 degree angle, not straight up into nostril.  Plain Allegra (NOT D )  160 daily , Loratidine 10 mg , OR Zyrtec 10 mg @ bedtime  as needed for itchy eyes & sneezing.   Albuterol is a rescue inhaler which should be used as infrequently as possible; it should never be used more than 1-2 puffs every 4 hours. It may  be used 15-30 minutes before exercise if that is also  a trigger for your asthma.   If it is required more than 2-3 times per week; the asthma is not well controlled. Symbicort is a  maintenance agent which helps control smooth muscle spasm and airway inflammation which are the driving factors in asthma.Albuterol only treats smooth muscle spasm.

## 2014-08-26 NOTE — Progress Notes (Signed)
   Subjective:    Patient ID: Shannon Mckenzie, female    DOB: [birth year], 54 y.o.   MRN: [id number]  HPI  Her symptoms began 08/24/14 as marked malaise; she basically went to bed & slept. 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  She had taken the flu shot this year.  She describes chills, sweats, wheezing, & shortness of breath. She's been using her Symbicort but has not been using the albuterol rescue inhaler.  This cough has been productive of yellow / brown sputum particularly in the morning. She has some facial discomfort as well.  She quit smoking in 1993. She has a history of asthma.   Review of Systems  She has no extrinsic symptoms of itchy, watery eyes, or sneezing.  She has no otic pain or discharge.  She had a slight sore throat and slight dental pain but she has no nasal purulence.      Objective:   Physical Exam General appearance:Adequately nourished; no acute distress or increased work of breathing is present.  No  lymphadenopathy about the head, neck, or axilla noted.   Eyes: No conjunctival inflammation or lid edema is present. There is no scleral icterus.  Ears:  External ear exam shows no significant lesions or deformities.  Otoscopic examination reveals clear canals, tympanic membranes are intact bilaterally without bulging, retraction, inflammation or discharge.  Nose:  External nasal examination shows no deformity or inflammation. There is slight nasal dislocation to the left. There is marked erythema of the nasal septum.  Oral exam: Dental hygiene is good; lips and gums are healthy appearing.There is no oropharyngeal erythema or exudate noted.   Neck:  No deformities, thyromegaly, masses, or tenderness noted.   Supple with full range of motion without pain.   Heart:  Normal rate and regular rhythm. S1 and S2 normal without gallop, murmur, click, rub or other extra sounds.   Lungs: Breath  sounds are decreased. Chest clear to auscultation; no wheezes, rhonchi,rales ,or rubs present.  Extremities:  No cyanosis, edema, or clubbing  noted    Skin: Warm & dry w/o jaundice or tenting.      Assessment & Plan:  #1 acute bronchitis w/o bronchospasm #2 no significant URIPlan: See orders and recommendations

## 2014-08-26 NOTE — Progress Notes (Signed)
Pre visit review using our clinic review tool, if applicable. No additional management support is needed unless otherwise documented below in the visit note. 

## 2014-09-13 ENCOUNTER — Other Ambulatory Visit: Payer: Self-pay | Admitting: Medical

## 2014-09-15 ENCOUNTER — Ambulatory Visit: Payer: BLUE CROSS/BLUE SHIELD | Admitting: Family

## 2014-09-21 ENCOUNTER — Ambulatory Visit: Payer: BLUE CROSS/BLUE SHIELD | Admitting: Family

## 2014-09-23 ENCOUNTER — Other Ambulatory Visit: Payer: Self-pay | Admitting: Medical

## 2014-09-29 ENCOUNTER — Other Ambulatory Visit: Payer: Self-pay | Admitting: Medical

## 2014-09-29 NOTE — Telephone Encounter (Signed)
Ok to RF? 

## 2014-11-09 ENCOUNTER — Encounter (HOSPITAL_BASED_OUTPATIENT_CLINIC_OR_DEPARTMENT_OTHER): Payer: Self-pay | Admitting: Emergency Medicine

## 2014-11-09 ENCOUNTER — Emergency Department (HOSPITAL_BASED_OUTPATIENT_CLINIC_OR_DEPARTMENT_OTHER)
Admission: EM | Admit: 2014-11-09 | Discharge: 2014-11-09 | Disposition: A | Discharge status: Home / Self Care | Payer: BLUE CROSS/BLUE SHIELD | Attending: Emergency Medicine | Admitting: Emergency Medicine

## 2014-11-09 DIAGNOSIS — Z87442 Personal history of urinary calculi: Secondary | ICD-10-CM | POA: Insufficient documentation

## 2014-11-09 DIAGNOSIS — Z87891 Personal history of nicotine dependence: Secondary | ICD-10-CM | POA: Insufficient documentation

## 2014-11-09 DIAGNOSIS — J45909 Unspecified asthma, uncomplicated: Secondary | ICD-10-CM | POA: Insufficient documentation

## 2014-11-09 DIAGNOSIS — Y9289 Other specified places as the place of occurrence of the external cause: Secondary | ICD-10-CM | POA: Diagnosis not present

## 2014-11-09 DIAGNOSIS — S39012A Strain of muscle, fascia and tendon of lower back, initial encounter: Secondary | ICD-10-CM

## 2014-11-09 DIAGNOSIS — Z880 Allergy status to penicillin: Secondary | ICD-10-CM | POA: Insufficient documentation

## 2014-11-09 DIAGNOSIS — Y998 Other external cause status: Secondary | ICD-10-CM | POA: Diagnosis not present

## 2014-11-09 DIAGNOSIS — Z8639 Personal history of other endocrine, nutritional and metabolic disease: Secondary | ICD-10-CM | POA: Diagnosis not present

## 2014-11-09 DIAGNOSIS — Z79899 Other long term (current) drug therapy: Secondary | ICD-10-CM | POA: Insufficient documentation

## 2014-11-09 DIAGNOSIS — G2581 Restless legs syndrome: Secondary | ICD-10-CM | POA: Diagnosis not present

## 2014-11-09 DIAGNOSIS — F419 Anxiety disorder, unspecified: Secondary | ICD-10-CM | POA: Diagnosis not present

## 2014-11-09 DIAGNOSIS — K219 Gastro-esophageal reflux disease without esophagitis: Secondary | ICD-10-CM | POA: Insufficient documentation

## 2014-11-09 DIAGNOSIS — X58XXXA Exposure to other specified factors, initial encounter: Secondary | ICD-10-CM | POA: Insufficient documentation

## 2014-11-09 DIAGNOSIS — Y9389 Activity, other specified: Secondary | ICD-10-CM | POA: Diagnosis not present

## 2014-11-09 DIAGNOSIS — S3992XA Unspecified injury of lower back, initial encounter: Secondary | ICD-10-CM | POA: Diagnosis present

## 2014-11-09 DIAGNOSIS — Z7951 Long term (current) use of inhaled steroids: Secondary | ICD-10-CM | POA: Diagnosis not present

## 2014-11-09 DIAGNOSIS — F329 Major depressive disorder, single episode, unspecified: Secondary | ICD-10-CM | POA: Insufficient documentation

## 2014-11-09 DIAGNOSIS — I10 Essential (primary) hypertension: Secondary | ICD-10-CM | POA: Insufficient documentation

## 2014-11-09 LAB — URINALYSIS, ROUTINE W REFLEX MICROSCOPIC
BILIRUBIN URINE: NEGATIVE
Glucose, UA: NEGATIVE mg/dL
HGB URINE DIPSTICK: NEGATIVE
Ketones, ur: NEGATIVE mg/dL
LEUKOCYTES UA: NEGATIVE
Nitrite: NEGATIVE
PH: 5.5 (ref 5.0–8.0)
Protein, ur: NEGATIVE mg/dL
Specific Gravity, Urine: 1.029 (ref 1.005–1.030)
Urobilinogen, UA: 0.2 mg/dL (ref 0.0–1.0)

## 2014-11-09 MED ORDER — HYDROCODONE-ACETAMINOPHEN 5-325 MG PO TABS: 1.0000 | ORAL_TABLET | Freq: Four times a day (QID) | ORAL | Status: DC | PRN

## 2014-11-09 MED ORDER — KETOROLAC TROMETHAMINE 60 MG/2ML IM SOLN
60.0000 mg | Freq: Once | INTRAMUSCULAR | Status: AC
  Administered 2014-11-09: 60 mg via INTRAMUSCULAR
  Filled 2014-11-09: qty 2

## 2014-11-09 MED ORDER — CYCLOBENZAPRINE HCL 5 MG PO TABS: 5.0000 mg | ORAL_TABLET | Freq: Three times a day (TID) | ORAL | Status: DC | PRN

## 2014-11-09 MED ORDER — DIAZEPAM 5 MG/ML IJ SOLN
5.0000 mg | Freq: Once | INTRAMUSCULAR | Status: AC
  Administered 2014-11-09: 5 mg via INTRAMUSCULAR
  Filled 2014-11-09: qty 2

## 2014-11-09 MED ORDER — DIAZEPAM 5 MG/ML IJ SOLN
5.0000 mg | Freq: Once | INTRAMUSCULAR | Status: AC
  Administered 2014-11-09: 5 mg via INTRAVENOUS
  Filled 2014-11-09: qty 2

## 2014-11-09 MED ORDER — MORPHINE SULFATE 4 MG/ML IJ SOLN
4.0000 mg | Freq: Once | INTRAMUSCULAR | Status: AC
  Administered 2014-11-09: 4 mg via INTRAMUSCULAR
  Filled 2014-11-09: qty 1

## 2014-11-09 MED ORDER — IBUPROFEN 800 MG PO TABS: 800.0000 mg | ORAL_TABLET | Freq: Three times a day (TID) | ORAL | Status: DC

## 2014-11-09 MED ORDER — HYDROMORPHONE HCL 1 MG/ML IJ SOLN
1.0000 mg | Freq: Once | INTRAMUSCULAR | Status: AC
  Administered 2014-11-09: 1 mg via INTRAVENOUS
  Filled 2014-11-09: qty 1

## 2014-11-09 NOTE — Discharge Instructions (Signed)
Take motrin for pain.  Take flexeril for muscle spasms.   Take vicodin for severe pain. DO NOT drive with it.   Follow up with your doctor tomorrow.   Return to ER if you have severe pain, unable to walk, trouble urinating.

## 2014-11-09 NOTE — ED Provider Notes (Signed)
CSN: [id number]     Arrival date & time 11/09/14  2050 History   This chart was scribed for Shannon Mckenzie Jailon Schaible, MD by Jarvis Morganaylor Ferguson, ED Scribe. This patient was seen in room MH04/MH04 and the patient's care was started at 10:06 PM.      Chief Complaint  Patient presents with  . Back Pain    The history is provided by the patient. No language interpreter was used.    HPI Comments: Megan SalonKathy Konicek is a 54 y.o. female who presents to the Emergency Department complaining of constant, lower back pain for 1 day that radiates into her bilateral legs. Pt states earlier she went to bend over today and all of a sudden her "back went out". She is able to walk w/o difficulty but states it provides pain. Pt denies Mckenzie/o kidney issues. She denies any urinary or bowel incontinence   Past Medical History  Diagnosis Date  . GERD (gastroesophageal reflux disease)   . Allergy   . Hyperlipidemia   . Anxiety   . Depression   . DJD (degenerative joint disease) of hip   . Former smoker   . Routine gynecological examination     sees gynecology, [name] NP  . Hiatal hernia   . Vision problems     wears contact lenses  . Hypertension   . Reactive airway disease   . RLS (restless legs syndrome)   . Statin intolerance   . SOB (shortness of breath) 9/14    cardiac eval with stress test and echo; Dr. Viann FishSpencer Tilley  . Kidney stone   . Asthma    Past Surgical History  Procedure Laterality Date  . Colonoscopy  06/2012    Dr. Ramon DredgeEdward; repeat 10 years  . Upper gi endoscopy N/A 07/24/2012    [place] Hernia,normal stomach, normal examined duodenum   Family History  Problem Relation Age of Onset  . Arthritis Mother   . Asthma Mother   . Diabetes Mother   . Cirrhosis Mother     died of liver nonalcoholic cirrhosis  . COPD Mother   . Heart disease Mother     CHF  . Asthma Sister   . Cancer Father     died of pancreatic cancer  . Depression Brother   . Stroke Maternal Aunt     died age 53yo of CVA  . Other  Brother     died of MVA   History  Substance Use Topics  . Smoking status: Former Smoker -- 1.00 packs/day for 15 years    Types: Cigarettes    Quit date: 06/25/1991  . Smokeless tobacco: Never Used  . Alcohol Use: No   OB History    No data available     Review of Systems  Musculoskeletal: Positive for back pain.  All other systems reviewed and are negative.     Allergies  Statins and Amoxicillin  Home Medications   Prior to Admission medications   Medication Sig Start Date End Date Taking? Authorizing Provider  albuterol (VENTOLIN HFA) 108 (90 BASE) MCG/ACT inhaler Inhale 2 puffs into the lungs every 6 (six) hours as needed for wheezing. 07/06/14   Veryl SpeakGregory D Calone, FNP  azithromycin (ZITHROMAX Z-PAK) 250 MG tablet 2 day 1, then 1 qd 08/26/14   Pecola LawlessWilliam F Hopper, MD  budesonide-formoterol [facility](SYMBICORT) 160-4.5 MCG/ACT inhaler Inhale 2 puffs into the lungs 2 (two) times daily. 07/06/14   Veryl SpeakGregory D Calone, FNP  Calcium Carb-Cholecalciferol (CALCIUM + D3 PO) Take by mouth.  Historical Provider, MD  esomeprazole (NEXIUM) 40 MG capsule Take 1 capsule (40 mg total) by mouth 2 (two) times daily. 07/06/14   Veryl SpeakGregory D Calone, FNP  ezetimibe (ZETIA) 10 MG tablet Take 1 tablet (10 mg total) by mouth daily. 07/06/14   Veryl SpeakGregory D Calone, FNP  ferrous fumarate (HEMOCYTE - 106 MG FE) 325 (106 FE) MG TABS tablet Take 1 tablet by mouth.    Historical Provider, MD  losartan (COZAAR) 50 MG tablet Take 1 tablet (50 mg total) by mouth daily. 07/06/14   Veryl SpeakGregory D Calone, FNP  montelukast (SINGULAIR) 10 MG tablet Take 1 tablet (10 mg total) by mouth at bedtime. 07/06/14   Veryl SpeakGregory D Calone, FNP  Multiple Vitamins-Minerals (MULTIVITAMIN WITH MINERALS) tablet Take 1 tablet by mouth daily.    Historical Provider, MD  rOPINIRole (REQUIP) 0.5 MG tablet take 1 tablet by mouth at bedtime 09/29/14   Kermit Baloavid S Tysinger, PA-C  sertraline (ZOLOFT) 100 MG tablet take 1 and 1/2 tablets by mouth at bedtime 07/11/14   Veryl SpeakGregory D  Calone, FNP  triamcinolone (NASACORT) 55 MCG/ACT nasal inhaler Place 2 sprays into the nose daily.    Historical Provider, MD   Triage Vitals: BP 166/86 mmHg  Pulse 73  Temp(Src) 98.1 F (36.7 C) (Oral)  Resp 18  Ht 5\' 2"  (1.575 m)  Wt 180 lb (81.647 kg)  BMI 32.91 kg/m2  SpO2 100%  Physical Exam  Constitutional: She is oriented to person, place, and time. She appears well-developed and well-nourished. No distress.  HENT:  Head: Normocephalic and atraumatic.  Eyes: Conjunctivae and EOM are normal.  Neck: Neck supple. No tracheal deviation present.  Cardiovascular: Normal rate.   Pulmonary/Chest: Effort normal. No respiratory distress.  Musculoskeletal: Normal range of motion.  right paralumbar tenderness. positive SLR on the right. No saddle anesthesia. Normal reflexes  Neurological: She is alert and oriented to person, place, and time.  Skin: Skin is warm and dry.  Psychiatric: She has a normal mood and affect. Her behavior is normal.  Nursing note and vitals reviewed.   ED Course  Procedures (including critical care time)  DIAGNOSTIC STUDIES: Oxygen Saturation is 100% on RA, normal by my interpretation.    COORDINATION OF CARE:    Labs Review Labs Reviewed  URINALYSIS, ROUTINE W REFLEX MICROSCOPIC    Imaging Review No results found.   EKG Interpretation None      MDM   Final diagnoses:  None   Megan SalonKathy Roethler is a 54 y.o. female here with R sided back pain with sciatica symptoms. Neurovascular intact. No injury so will not need imaging. Tried IM toradol, morphine, and valium initially but still in pain. Given IV valium and dilaudid and pain improved. UA showed no blood so I doubt renal colic. Will dc home with motrin, flexeril, vicodin.    I personally performed the services described in this documentation, which was scribed in my presence. The recorded information has been reviewed and is accurate.   Shannon Mckenzie Sabri Teal, MD 11/09/14 [phone]

## 2014-11-09 NOTE — ED Notes (Signed)
Pt states she went to bend over this evening and back went out

## 2014-11-10 ENCOUNTER — Encounter: Payer: Self-pay | Admitting: Family

## 2014-11-10 ENCOUNTER — Ambulatory Visit (INDEPENDENT_AMBULATORY_CARE_PROVIDER_SITE_OTHER): Payer: BLUE CROSS/BLUE SHIELD | Admitting: Family

## 2014-11-10 ENCOUNTER — Ambulatory Visit (INDEPENDENT_AMBULATORY_CARE_PROVIDER_SITE_OTHER)
Admission: RE | Admit: 2014-11-10 | Discharge: 2014-11-10 | Disposition: A | Discharge status: Home / Self Care | Payer: BLUE CROSS/BLUE SHIELD | Source: Ambulatory Visit | Attending: Family | Admitting: Family

## 2014-11-10 VITALS — BP 120/78 | HR 73 | Temp 98.1°F | Resp 18 | Ht 62.0 in | Wt 179.0 lb

## 2014-11-10 DIAGNOSIS — R202 Paresthesia of skin: Secondary | ICD-10-CM | POA: Diagnosis not present

## 2014-11-10 DIAGNOSIS — M549 Dorsalgia, unspecified: Secondary | ICD-10-CM | POA: Insufficient documentation

## 2014-11-10 DIAGNOSIS — M5441 Lumbago with sciatica, right side: Secondary | ICD-10-CM | POA: Diagnosis not present

## 2014-11-10 DIAGNOSIS — R200 Anesthesia of skin: Secondary | ICD-10-CM

## 2014-11-10 NOTE — Patient Instructions (Addendum)
Thank you for choosing [facility].  Summary/Instructions:  Please stop by radiology on the basement level of the building for your x-rays. Your results will be released to MyChart (or called to you) after review, usually within 72 hours after test completion. If any treatments or changes are necessary, you will be notified at that same time.  Referrals have been made during this visit. You should expect to hear back from our schedulers in about 7-10 days in regards to establishing an appointment with the specialists we discussed.   If your symptoms worsen or fail to improve, please contact our office for further instruction, or in case of emergency go directly to the emergency room at the closest medical facility.   Low Back Strain with Rehab A strain is an injury in which a tendon or muscle is torn. The muscles and tendons of the lower back are vulnerable to strains. However, these muscles and tendons are very strong and require a great force to be injured. Strains are classified into three categories. Grade 1 strains cause pain, but the tendon is not lengthened. Grade 2 strains include a lengthened ligament, due to the ligament being stretched or partially ruptured. With grade 2 strains there is still function, although the function may be decreased. Grade 3 strains involve a complete tear of the tendon or muscle, and function is usually impaired. SYMPTOMS   Pain in the lower back.  Pain that affects one side more than the other.  Pain that gets worse with movement and may be felt in the hip, buttocks, or back of the thigh.  Muscle spasms of the muscles in the back.  Swelling along the muscles of the back.  Loss of strength of the back muscles.  Crackling sound (crepitation) when the muscles are touched. CAUSES  Lower back strains occur when a force is placed on the muscles or tendons that is greater than they can handle. Common causes of injury include:  Prolonged overuse of  the muscle-tendon units in the lower back, usually from incorrect posture.  A single violent injury or force applied to the back. RISK INCREASES WITH:  Sports that involve twisting forces on the spine or a lot of bending at the waist (football, rugby, weightlifting, bowling, golf, tennis, speed skating, racquetball, swimming, running, gymnastics, diving).  Poor strength and flexibility.  Failure to warm up properly before activity.  Family history of lower back pain or disk disorders.  Previous back injury or surgery (especially fusion).  Poor posture with lifting, especially heavy objects.  Prolonged sitting, especially with poor posture. PREVENTION   Learn and use proper posture when sitting or lifting (maintain proper posture when sitting, lift using the knees and legs, not at the waist).  Warm up and stretch properly before activity.  Allow for adequate recovery between workouts.  Maintain physical fitness:  Strength, flexibility, and endurance.  Cardiovascular fitness. PROGNOSIS  If treated properly, lower back strains usually heal within 6 weeks. RELATED COMPLICATIONS   Recurring symptoms, resulting in a chronic problem.  Chronic inflammation, scarring, and partial muscle-tendon tear.  Delayed healing or resolution of symptoms.  Prolonged disability. TREATMENT  Treatment first involves the use of ice and medicine, to reduce pain and inflammation. The use of strengthening and stretching exercises may help reduce pain with activity. These exercises may be performed at home or with a therapist. Severe injuries may require referral to a therapist for further evaluation and treatment, such as ultrasound. Your caregiver may advise that you wear  a back brace or corset, to help reduce pain and discomfort. Often, prolonged bed rest results in greater harm then benefit. Corticosteroid injections may be recommended. However, these should be reserved for the most serious cases.  It is important to avoid using your back when lifting objects. At night, sleep on your back on a firm mattress with a pillow placed under your knees. If non-surgical treatment is unsuccessful, surgery may be needed.  MEDICATION   If pain medicine is needed, nonsteroidal anti-inflammatory medicines (aspirin and ibuprofen), or other minor pain relievers (acetaminophen), are often advised.  Do not take pain medicine for 7 days before surgery.  Prescription pain relievers may be given, if your caregiver thinks they are needed. Use only as directed and only as much as you need.  Ointments applied to the skin may be helpful.  Corticosteroid injections may be given by your caregiver. These injections should be reserved for the most serious cases, because they may only be given a certain number of times. HEAT AND COLD  Cold treatment (icing) should be applied for 10 to 15 minutes every 2 to 3 hours for inflammation and pain, and immediately after activity that aggravates your symptoms. Use ice packs or an ice massage.  Heat treatment may be used before performing stretching and strengthening activities prescribed by your caregiver, physical therapist, or athletic trainer. Use a heat pack or a warm water soak. SEEK MEDICAL CARE IF:   Symptoms get worse or do not improve in 2 to 4 weeks, despite treatment.  You develop numbness, weakness, or loss of bowel or bladder function.  New, unexplained symptoms develop. (Drugs used in treatment may produce side effects.) EXERCISES  RANGE OF MOTION (ROM) AND STRETCHING EXERCISES - Low Back Strain Most people with lower back pain will find that their symptoms get worse with excessive bending forward (flexion) or arching at the lower back (extension). The exercises which will help resolve your symptoms will focus on the opposite motion.  Your physician, physical therapist or athletic trainer will help you determine which exercises will be most helpful to  resolve your lower back pain. Do not complete any exercises without first consulting with your caregiver. Discontinue any exercises which make your symptoms worse until you speak to your caregiver.  If you have pain, numbness or tingling which travels down into your buttocks, leg or foot, the goal of the therapy is for these symptoms to move closer to your back and eventually resolve. Sometimes, these leg symptoms will get better, but your lower back pain may worsen. This is typically an indication of progress in your rehabilitation. Be very alert to any changes in your symptoms and the activities in which you participated in the 24 hours prior to the change. Sharing this information with your caregiver will allow him/her to most efficiently treat your condition.  These exercises may help you when beginning to rehabilitate your injury. Your symptoms may resolve with or without further involvement from your physician, physical therapist or athletic trainer. While completing these exercises, remember:  Restoring tissue flexibility helps normal motion to return to the joints. This allows healthier, less painful movement and activity.  An effective stretch should be held for at least 30 seconds.  A stretch should never be painful. You should only feel a gentle lengthening or release in the stretched tissue. FLEXION RANGE OF MOTION AND STRETCHING EXERCISES: STRETCH - Flexion, Single Knee to Chest   Lie on a firm bed or floor with both legs extended  in front of you.  Keeping one leg in contact with the floor, bring your opposite knee to your chest. Hold your leg in place by either grabbing behind your thigh or at your knee.  Pull until you feel a gentle stretch in your lower back. Hold __________ seconds.  Slowly release your grasp and repeat the exercise with the opposite side. Repeat __________ times. Complete this exercise __________ times per day.  STRETCH - Flexion, Double Knee to Chest   Lie  on a firm bed or floor with both legs extended in front of you.  Keeping one leg in contact with the floor, bring your opposite knee to your chest.  Tense your stomach muscles to support your back and then lift your other knee to your chest. Hold your legs in place by either grabbing behind your thighs or at your knees.  Pull both knees toward your chest until you feel a gentle stretch in your lower back. Hold __________ seconds.  Tense your stomach muscles and slowly return one leg at a time to the floor. Repeat __________ times. Complete this exercise __________ times per day.  STRETCH - Low Trunk Rotation  Lie on a firm bed or floor. Keeping your legs in front of you, bend your knees so they are both pointed toward the ceiling and your feet are flat on the floor.  Extend your arms out to the side. This will stabilize your upper body by keeping your shoulders in contact with the floor.  Gently and slowly drop both knees together to one side until you feel a gentle stretch in your lower back. Hold for __________ seconds.  Tense your stomach muscles to support your lower back as you bring your knees back to the starting position. Repeat the exercise to the other side. Repeat __________ times. Complete this exercise __________ times per day  EXTENSION RANGE OF MOTION AND FLEXIBILITY EXERCISES: STRETCH - Extension, Prone on Elbows   Lie on your stomach on the floor, a bed will be too soft. Place your palms about shoulder width apart and at the height of your head.  Place your elbows under your shoulders. If this is too painful, stack pillows under your chest.  Allow your body to relax so that your hips drop lower and make contact more completely with the floor.  Hold this position for __________ seconds.  Slowly return to lying flat on the floor. Repeat __________ times. Complete this exercise __________ times per day.  RANGE OF MOTION - Extension, Prone Press Ups  Lie on your  stomach on the floor, a bed will be too soft. Place your palms about shoulder width apart and at the height of your head.  Keeping your back as relaxed as possible, slowly straighten your elbows while keeping your hips on the floor. You may adjust the placement of your hands to maximize your comfort. As you gain motion, your hands will come more underneath your shoulders.  Hold this position __________ seconds.  Slowly return to lying flat on the floor. Repeat __________ times. Complete this exercise __________ times per day.  RANGE OF MOTION- Quadruped, Neutral Spine   Assume a hands and knees position on a firm surface. Keep your hands under your shoulders and your knees under your hips. You may place padding under your knees for comfort.  Drop your head and point your tail bone toward the ground below you. This will round out your lower back like an angry cat. Hold this position for  __________ seconds.  Slowly lift your head and release your tail bone so that your back sags into a large arch, like an old horse.  Hold this position for __________ seconds.  Repeat this until you feel limber in your lower back.  Now, find your "sweet spot." This will be the most comfortable position somewhere between the two previous positions. This is your neutral spine. Once you have found this position, tense your stomach muscles to support your lower back.  Hold this position for __________ seconds. Repeat __________ times. Complete this exercise __________ times per day.  STRENGTHENING EXERCISES - Low Back Strain These exercises may help you when beginning to rehabilitate your injury. These exercises should be done near your "sweet spot." This is the neutral, low-back arch, somewhere between fully rounded and fully arched, that is your least painful position. When performed in this safe range of motion, these exercises can be used for people who have either a flexion or extension based injury. These  exercises may resolve your symptoms with or without further involvement from your physician, physical therapist or athletic trainer. While completing these exercises, remember:   Muscles can gain both the endurance and the strength needed for everyday activities through controlled exercises.  Complete these exercises as instructed by your physician, physical therapist or athletic trainer. Increase the resistance and repetitions only as guided.  You may experience muscle soreness or fatigue, but the pain or discomfort you are trying to eliminate should never worsen during these exercises. If this pain does worsen, stop and make certain you are following the directions exactly. If the pain is still present after adjustments, discontinue the exercise until you can discuss the trouble with your caregiver. STRENGTHENING - Deep Abdominals, Pelvic Tilt  Lie on a firm bed or floor. Keeping your legs in front of you, bend your knees so they are both pointed toward the ceiling and your feet are flat on the floor.  Tense your lower abdominal muscles to press your lower back into the floor. This motion will rotate your pelvis so that your tail bone is scooping upwards rather than pointing at your feet or into the floor.  With a gentle tension and even breathing, hold this position for __________ seconds. Repeat __________ times. Complete this exercise __________ times per day.  STRENGTHENING - Abdominals, Crunches   Lie on a firm bed or floor. Keeping your legs in front of you, bend your knees so they are both pointed toward the ceiling and your feet are flat on the floor. Cross your arms over your chest.  Slightly tip your chin down without bending your neck.  Tense your abdominals and slowly lift your trunk high enough to just clear your shoulder blades. Lifting higher can put excessive stress on the lower back and does not further strengthen your abdominal muscles.  Control your return to the starting  position. Repeat __________ times. Complete this exercise __________ times per day.  STRENGTHENING - Quadruped, Opposite UE/LE Lift   Assume a hands and knees position on a firm surface. Keep your hands under your shoulders and your knees under your hips. You may place padding under your knees for comfort.  Find your neutral spine and gently tense your abdominal muscles so that you can maintain this position. Your shoulders and hips should form a rectangle that is parallel with the floor and is not twisted.  Keeping your trunk steady, lift your right hand no higher than your shoulder and then your left leg  no higher than your hip. Make sure you are not holding your breath. Hold this position __________ seconds.  Continuing to keep your abdominal muscles tense and your back steady, slowly return to your starting position. Repeat with the opposite arm and leg. Repeat __________ times. Complete this exercise __________ times per day.  STRENGTHENING - Lower Abdominals, Double Knee Lift  Lie on a firm bed or floor. Keeping your legs in front of you, bend your knees so they are both pointed toward the ceiling and your feet are flat on the floor.  Tense your abdominal muscles to brace your lower back and slowly lift both of your knees until they come over your hips. Be certain not to hold your breath.  Hold __________ seconds. Using your abdominal muscles, return to the starting position in a slow and controlled manner. Repeat __________ times. Complete this exercise __________ times per day.  POSTURE AND BODY MECHANICS CONSIDERATIONS - Low Back Strain Keeping correct posture when sitting, standing or completing your activities will reduce the stress put on different body tissues, allowing injured tissues a chance to heal and limiting painful experiences. The following are general guidelines for improved posture. Your physician or physical therapist will provide you with any instructions specific to  your needs. While reading these guidelines, remember:  The exercises prescribed by your provider will help you have the flexibility and strength to maintain correct postures.  The correct posture provides the best environment for your joints to work. All of your joints have less wear and tear when properly supported by a spine with good posture. This means you will experience a healthier, less painful body.  Correct posture must be practiced with all of your activities, especially prolonged sitting and standing. Correct posture is as important when doing repetitive low-stress activities (typing) as it is when doing a single heavy-load activity (lifting). RESTING POSITIONS Consider which positions are most painful for you when choosing a resting position. If you have pain with flexion-based activities (sitting, bending, stooping, squatting), choose a position that allows you to rest in a less flexed posture. You would want to avoid curling into a fetal position on your side. If your pain worsens with extension-based activities (prolonged standing, working overhead), avoid resting in an extended position such as sleeping on your stomach. Most people will find more comfort when they rest with their spine in a more neutral position, neither too rounded nor too arched. Lying on a non-sagging bed on your side with a pillow between your knees, or on your back with a pillow under your knees will often provide some relief. Keep in mind, being in any one position for a prolonged period of time, no matter how correct your posture, can still lead to stiffness. PROPER SITTING POSTURE In order to minimize stress and discomfort on your spine, you must sit with correct posture. Sitting with good posture should be effortless for a healthy body. Returning to good posture is a gradual process. Many people can work toward this most comfortably by using various supports until they have the flexibility and strength to maintain  this posture on their own. When sitting with proper posture, your ears will fall over your shoulders and your shoulders will fall over your hips. You should use the back of the chair to support your upper back. Your lower back will be in a neutral position, just slightly arched. You may place a small pillow or folded towel at the base of your lower back for support.  When working at a desk, create an environment that supports good, upright posture. Without extra support, muscles tire, which leads to excessive strain on joints and other tissues. Keep these recommendations in mind: CHAIR:  A chair should be able to slide under your desk when your back makes contact with the back of the chair. This allows you to work closely.  The chair's height should allow your eyes to be level with the upper part of your monitor and your hands to be slightly lower than your elbows. BODY POSITION  Your feet should make contact with the floor. If this is not possible, use a foot rest.  Keep your ears over your shoulders. This will reduce stress on your neck and lower back. INCORRECT SITTING POSTURES  If you are feeling tired and unable to assume a healthy sitting posture, do not slouch or slump. This puts excessive strain on your back tissues, causing more damage and pain. Healthier options include:  Using more support, like a lumbar pillow.  Switching tasks to something that requires you to be upright or walking.  Talking a brief walk.  Lying down to rest in a neutral-spine position. PROLONGED STANDING WHILE SLIGHTLY LEANING FORWARD  When completing a task that requires you to lean forward while standing in one place for a long time, place either foot up on a stationary 2-4 inch high object to help maintain the best posture. When both feet are on the ground, the lower back tends to lose its slight inward curve. If this curve flattens (or becomes too large), then the back and your other joints will experience  too much stress, tire more quickly, and can cause pain. CORRECT STANDING POSTURES Proper standing posture should be assumed with all daily activities, even if they only take a few moments, like when brushing your teeth. As in sitting, your ears should fall over your shoulders and your shoulders should fall over your hips. You should keep a slight tension in your abdominal muscles to brace your spine. Your tailbone should point down to the ground, not behind your body, resulting in an over-extended swayback posture.  INCORRECT STANDING POSTURES  Common incorrect standing postures include a forward head, locked knees and/or an excessive swayback. WALKING Walk with an upright posture. Your ears, shoulders and hips should all line-up. PROLONGED ACTIVITY IN A FLEXED POSITION When completing a task that requires you to bend forward at your waist or lean over a low surface, try to find a way to stabilize 3 out of 4 of your limbs. You can place a hand or elbow on your thigh or rest a knee on the surface you are reaching across. This will provide you more stability so that your muscles do not fatigue as quickly. By keeping your knees relaxed, or slightly bent, you will also reduce stress across your lower back. CORRECT LIFTING TECHNIQUES DO :   Assume a wide stance. This will provide you more stability and the opportunity to get as close as possible to the object which you are lifting.  Tense your abdominals to brace your spine. Bend at the knees and hips. Keeping your back locked in a neutral-spine position, lift using your leg muscles. Lift with your legs, keeping your back straight.  Test the weight of unknown objects before attempting to lift them.  Try to keep your elbows locked down at your sides in order get the best strength from your shoulders when carrying an object.  Always ask for help when lifting  heavy or awkward objects. INCORRECT LIFTING TECHNIQUES DO NOT:   Lock your knees when  lifting, even if it is a small object.  Bend and twist. Pivot at your feet or move your feet when needing to change directions.  Assume that you can safely pick up even a paper clip without proper posture. Document Released: 06/10/2005 Document Revised: 09/02/2011 Document Reviewed: 09/22/2008 [facility] Patient Information 2015 [place], [place]. This information is not intended to replace advice given to you by your health care provider. Make sure you discuss any questions you have with your health care provider.

## 2014-11-10 NOTE — Progress Notes (Signed)
Subjective:    Patient ID: Shannon Mckenzie, female    DOB: 03-Jul-1960, 54 y.o.   MRN: [id number]  Chief Complaint  Patient presents with  . Follow-up    has had bad back pain since yesterday that moves from the lower center of her back to her right side, also still has pain in fingers and feels like electricity shooting through her legs and hip    HPI:  Shannon Mckenzie is a 54 y.o. female with a PMH of hypertension, hyperlipidemia, anemia, and numbness and tingling who presents today for an office follow-up.  1) Back pain - associated symptom of pain located in her lower back for about a day following bending over to reach for something. Pain is described as "it hurts" and sometimes sharp with a severity similar to childbreath or 10/10. Seen in the ED and diagnosed with sciatica and treated with toradol, valium, and morphine initally and then diladudid which eventually decreased her pain. She was then discharged on motrin flexeril and vicodin. Denies changes to bowel or bladder function or saddle anesthia. Relieving factors include keeping her leg straight. Pain is also radiating through her right buttock and towards her leg. She did take a flexeril which allowed her to sleep. Functionality is slightly decreased and has difficulty with getting dressed and moving around.  2) Numbness and tingling - previously seen in the office for numbess and tingling with lab mostly normal with the exception of iron. Right second and third metacarpal are the worst indicating that at worst you can see the vein in her finger. Describes the sensations as lightening sensation. It occurs fairly infrequently about 1-2 months per month. There is no current numbness or tingling at present.    Allergies  Allergen Reactions  . Statins     Myalgias with all statins  . Amoxicillin Hives and Rash    Current Outpatient Prescriptions on File Prior to Visit  Medication Sig Dispense Refill  . albuterol (VENTOLIN HFA) 108  (90 BASE) MCG/ACT inhaler Inhale 2 puffs into the lungs every 6 (six) hours as needed for wheezing. 1 Inhaler 1  . azithromycin (ZITHROMAX Z-PAK) 250 MG tablet 2 day 1, then 1 qd 6 tablet 0  . budesonide-formoterol (SYMBICORT) 160-4.5 MCG/ACT inhaler Inhale 2 puffs into the lungs 2 (two) times daily. 10.2 g 5  . Calcium Carb-Cholecalciferol (CALCIUM + D3 PO) Take by mouth.    . cyclobenzaprine (FLEXERIL) 5 MG tablet Take 1 tablet (5 mg total) by mouth 3 (three) times daily as needed for muscle spasms. 10 tablet 0  . esomeprazole (NEXIUM) 40 MG capsule Take 1 capsule (40 mg total) by mouth 2 (two) times daily. 180 capsule 3  . ezetimibe (ZETIA) 10 MG tablet Take 1 tablet (10 mg total) by mouth daily. 90 tablet 3  . ferrous fumarate (HEMOCYTE - 106 MG FE) 325 (106 FE) MG TABS tablet Take 1 tablet by mouth.    Marland Kitchen. HYDROcodone-acetaminophen (NORCO/VICODIN) 5-325 MG per tablet Take 1-2 tablets by mouth every 6 (six) hours as needed. 8 tablet 0  . ibuprofen (ADVIL,MOTRIN) 800 MG tablet Take 1 tablet (800 mg total) by mouth 3 (three) times daily. 21 tablet 0  . losartan (COZAAR) 50 MG tablet Take 1 tablet (50 mg total) by mouth daily. 90 tablet 3  . montelukast (SINGULAIR) 10 MG tablet Take 1 tablet (10 mg total) by mouth at bedtime. 30 tablet 2  . Multiple Vitamins-Minerals (MULTIVITAMIN WITH MINERALS) tablet Take 1 tablet by mouth daily.    .Marland Kitchen  rOPINIRole (REQUIP) 0.5 MG tablet take 1 tablet by mouth at bedtime 90 tablet 3  . sertraline (ZOLOFT) 100 MG tablet take 1 and 1/2 tablets by mouth at bedtime 135 tablet 1  . triamcinolone (NASACORT) 55 MCG/ACT nasal inhaler Place 2 sprays into the nose daily.     No current facility-administered medications on file prior to visit.    Review of Systems  Constitutional: Negative for fever and chills.  Musculoskeletal: Positive for back pain. Negative for neck pain and neck stiffness.  Neurological: Positive for numbness.      Objective:    BP 120/78 mmHg   Pulse 73  Temp(Src) 98.1 F (36.7 C) (Oral)  Resp 18  Ht 5\' 2"  (1.575 m)  Wt 179 lb (81.194 kg)  BMI 32.73 kg/m2  SpO2 96% Nursing note and vital signs reviewed.  Physical Exam  Constitutional: She is oriented to person, place, and time. She appears well-developed and well-nourished. No distress.  Cardiovascular: Normal rate, regular rhythm, normal heart sounds and intact distal pulses.   Pulmonary/Chest: Effort normal and breath sounds normal.  Musculoskeletal:  Low back: No obvious deformity, discoloration, or edema of low back noted. Palpable tenderness along the right paraspinal musculature with no obvious spasm. Patient displays full range of motion in all directions with pain in extreme extension and right lateral bending. Hip flexion increases pain. Straight leg raise is positive.  Right hand: No obvious deformity, discoloration, or edema noted. No palpable tenderness able to be elicited at this time. Sensation is normal and intact. Pulses are intact and appropriate. Range of motion is complete.  Neurological: She is alert and oriented to person, place, and time.  Skin: Skin is warm and dry.  Psychiatric: She has a normal mood and affect. Her behavior is normal. Judgment and thought content normal.       Assessment & Plan:

## 2014-11-10 NOTE — Progress Notes (Signed)
Pre visit review using our clinic review tool, if applicable. No additional management support is needed unless otherwise documented below in the visit note. 

## 2014-11-10 NOTE — Assessment & Plan Note (Signed)
Symptoms and exam consistent with low back muscle strain. Continue previously prescribed medications and current dosages of Flexeril and ibuprofen. Discussed risks and benefits of using Vicodin that was previously prescribed. Patient will use those sparingly. Continue conservative treatment with heat and stretching multiple times throughout the day as needed. Follow-up if symptoms worsen or fail to improve.

## 2014-11-10 NOTE — Assessment & Plan Note (Signed)
Continues to experience numbness and tingling primarily in her right hand within the second and third digit. Previous lab work within normal limits ruling out metabolic causes. Obtain cervical x-ray to rule out stenosis. Refer to neurology for further assessment and testing.

## 2014-11-17 ENCOUNTER — Other Ambulatory Visit: Payer: Self-pay

## 2014-11-17 MED ORDER — MONTELUKAST SODIUM 10 MG PO TABS: 10.0000 mg | ORAL_TABLET | Freq: Every day | ORAL | Status: DC

## 2014-11-29 ENCOUNTER — Ambulatory Visit: Payer: BLUE CROSS/BLUE SHIELD | Admitting: Family

## 2014-11-30 ENCOUNTER — Encounter: Payer: Self-pay | Admitting: Neurology

## 2014-11-30 ENCOUNTER — Ambulatory Visit (INDEPENDENT_AMBULATORY_CARE_PROVIDER_SITE_OTHER): Payer: BLUE CROSS/BLUE SHIELD | Admitting: Neurology

## 2014-11-30 VITALS — BP 128/84 | HR 70 | Ht 62.0 in | Wt 181.2 lb

## 2014-11-30 DIAGNOSIS — G894 Chronic pain syndrome: Secondary | ICD-10-CM

## 2014-11-30 DIAGNOSIS — R52 Pain, unspecified: Secondary | ICD-10-CM | POA: Diagnosis not present

## 2014-11-30 DIAGNOSIS — R202 Paresthesia of skin: Secondary | ICD-10-CM

## 2014-11-30 NOTE — Patient Instructions (Signed)
EMG of the left arm and leg Telephone update with results

## 2014-11-30 NOTE — Progress Notes (Signed)
[facility] Clinic Note - Initial Visit   Date: 11/30/2014   Shannon Mckenzie MRN: [id number] DOB: 1961-04-23   Dear Jeanine Luz, FNP:  Thank you for your kind referral of Shannon Mckenzie for consultation of whole body pain and paresthesias. Although her history is well known to you, please allow [place] to reiterate it for the purpose of our medical record. The patient was accompanied to the clinic by self.   History of Present Illness: Shannon Mckenzie is a 54 y.o. right-handed Caucasian female with GERD, hyperlipidemia, hypertension, asthma, previous tobacco use, and depression presenting for evaluation of whole body pain.    Starting in 2015, she developed burning sensation of the left thigh, groin, left shoulder, and bilateral feet. She has whole body symptoms of pain which move around from one body part to another. She feels that her pain is in her bones, but also complains of electrical sensation throughout. Physical activity exacerbates her pain.  Rest and advil significantly helps her pain. She has significant history of back pain and takes flexeril for this.   Her arms and legs fall asleep all the time.   She also complains that her veins in her right 2nd and 3rd fingers is visible.    She endorses joint pain, especially left shoulder and hip.  No muscle tenderness.  She is not interested in taking pain medications unless she knows what it causing her pain.  Out-side paper records, electronic medical record, and images have been reviewed where available and summarized as:  Lab Results  Component Value Date   TSH 2.45 05/03/2014   Lab Results  Component Value Date   HGBA1C 5.2 05/03/2014   MRI cervical spine wo contrast 12/15/2005: 1. The study is mildly limited by motion artifact, but shows no focal disc herniation, cord deformity or definite nerve root compression.  2. At C6-7, there is annular disc bulging contributing to biforaminal stenosis and  possible C7 nerve root encroachment. Correlate clinically.  3. Lesser disc bulging from C2-3 through C4-5 without resulting mass effect.  Past Medical History  Diagnosis Date  . GERD (gastroesophageal reflux disease)   . Allergy   . Hyperlipidemia   . Anxiety   . Depression   . DJD (degenerative joint disease) of hip   . Former smoker   . Routine gynecological examination     sees gynecology, [name] NP  . Hiatal hernia   . Vision problems     wears contact lenses  . Hypertension   . Reactive airway disease   . RLS (restless legs syndrome)   . Statin intolerance   . SOB (shortness of breath) 9/14    cardiac eval with stress test and echo; Dr. [name]  . Kidney stone   . Asthma     Past Surgical History  Procedure Laterality Date  . Colonoscopy  06/2012    Dr. Ramon Dredge; repeat 10 years  . Upper gi endoscopy N/A 07/24/2012    [place] Hernia,normal stomach, normal examined duodenum     Medications:  Current Outpatient Prescriptions on File Prior to Visit  Medication Sig Dispense Refill  . albuterol (VENTOLIN HFA) 108 (90 BASE) MCG/ACT inhaler Inhale 2 puffs into the lungs every 6 (six) hours as needed for wheezing. 1 Inhaler 1  . budesonide-formoterol (SYMBICORT) 160-4.5 MCG/ACT inhaler Inhale 2 puffs into the lungs 2 (two) times daily. 10.2 g 5  . Calcium Carb-Cholecalciferol (CALCIUM + D3 PO) Take by mouth.    . esomeprazole (NEXIUM) 40  MG capsule Take 1 capsule (40 mg total) by mouth 2 (two) times daily. 180 capsule 3  . ezetimibe (ZETIA) 10 MG tablet Take 1 tablet (10 mg total) by mouth daily. 90 tablet 3  . ferrous fumarate (HEMOCYTE - 106 MG FE) 325 (106 FE) MG TABS tablet Take 1 tablet by mouth.    Marland Kitchen. glucosamine-chondroitin 500-400 MG tablet Take 1 tablet by mouth 3 (three) times daily.    Marland Kitchen. ibuprofen (ADVIL,MOTRIN) 800 MG tablet Take 1 tablet (800 mg total) by mouth 3 (three) times daily. 21 tablet 0  . losartan (COZAAR) 50 MG tablet Take 1 tablet (50 mg  total) by mouth daily. 90 tablet 3  . montelukast (SINGULAIR) 10 MG tablet Take 1 tablet (10 mg total) by mouth at bedtime. 30 tablet 2  . Multiple Vitamins-Minerals (MULTIVITAMIN WITH MINERALS) tablet Take 1 tablet by mouth daily.    Marland Kitchen. rOPINIRole (REQUIP) 0.5 MG tablet take 1 tablet by mouth at bedtime 90 tablet 3  . sertraline (ZOLOFT) 100 MG tablet take 1 and 1/2 tablets by mouth at bedtime 135 tablet 1  . triamcinolone (NASACORT) 55 MCG/ACT nasal inhaler Place 2 sprays into the nose daily.     No current facility-administered medications on file prior to visit.    Allergies:  Allergies  Allergen Reactions  . Statins     Myalgias with all statins  . Amoxicillin Hives and Rash    Family History: Family History  Problem Relation Age of Onset  . Arthritis Mother   . Asthma Mother   . Diabetes Mother   . Cirrhosis Mother     died of liver nonalcoholic cirrhosis  . COPD Mother   . Heart disease Mother     CHF  . Asthma Sister   . Cancer Father     died of pancreatic cancer  . Depression Brother   . Stroke Maternal Aunt     died age 250yo of CVA  . Other Brother     died of MVA    Social History: History   Social History  . Marital Status: Married    Spouse Name: N/A  . Number of Children: 1  . Years of Education: 9   Occupational History  . Housekeeping    Social History Main Topics  . Smoking status: Former Smoker -- 1.00 packs/day for 15 years    Types: Cigarettes    Quit date: 06/25/1991  . Smokeless tobacco: Never Used  . Alcohol Use: No  . Drug Use: No  . Sexual Activity: Not Currently   Other Topics Concern  . Not on file   Social History Narrative   Married, 1 son, no exercise other than on the job, [employer]   Fun: Watch movies, be outside and work in the yard, photography   Denies any religious beliefs that would effect health care.     Review of Systems:  CONSTITUTIONAL: No fevers, chills, night sweats, or weight loss.   EYES: No visual  changes or eye pain ENT: No hearing changes.  No history of nose bleeds.   RESPIRATORY: No cough, wheezing and shortness of breath.   CARDIOVASCULAR: Negative for chest pain, and palpitations.   GI: Negative for abdominal discomfort, blood in stools or black stools.  No recent change in bowel habits.   GU:  No history of incontinence.   MUSCLOSKELETAL: +history of joint pain or swelling.  No myalgias.   SKIN: Negative for lesions, rash, and itching.   HEMATOLOGY/ONCOLOGY: Negative for  prolonged bleeding, bruising easily, and swollen nodes.  No history of cancer.   ENDOCRINE: Negative for cold or heat intolerance, polydipsia or goiter.   PSYCH:  +depression or anxiety symptoms.   NEURO: As Above.   Vital Signs:  BP 128/84 mmHg  Pulse 70  Ht  (1.575 m)  Wt 181 lb 4 oz (82.214 kg)  BMI 33.14 kg/m2  SpO2 94%  LMP 09/11/2014 (Approximate)   General Medical Exam:   General:  Well appearing, comfortable.   Eyes/ENT: see cranial nerve examination.   Neck: No masses appreciated.  Full range of motion without tenderness.  No carotid bruits. Respiratory:  Clear to auscultation, good air entry bilaterally.   Cardiac:  Regular rate and rhythm, no murmur.   Extremities:  No deformities, edema, or skin discoloration.  Skin:  No rashes or lesions.  Neurological Exam: MENTAL STATUS including orientation to time, place, person, recent and remote memory, attention span and concentration, language, and fund of knowledge is normal.  Speech is not dysarthric.  CRANIAL NERVES: II:  No visual field defects.  Unremarkable fundi.   III-IV-VI: Pupils equal round and reactive to light.  Normal conjugate, extra-ocular eye movements in all directions of gaze.  No nystagmus.  No ptosis.   V:  Normal facial sensation.   VII:  Normal facial symmetry and movements.  No pathologic facial reflexes.  VIII:  Normal hearing and vestibular function.   IX-X:  Normal palatal movement.   XI:  Normal shoulder  shrug and head rotation.   XII:  Normal tongue strength and range of motion, no deviation or fasciculation.  MOTOR:  No atrophy, fasciculations or abnormal movements.  No pronator drift.  Tone is normal.    Right Upper Extremity:    Left Upper Extremity:    Deltoid  5/5   Deltoid  5/5   Biceps  5/5   Biceps  5/5   Triceps  5/5   Triceps  5/5   Wrist extensors  5/5   Wrist extensors  5/5   Wrist flexors  5/5   Wrist flexors  5/5   Finger extensors  5/5   Finger extensors  5/5   Finger flexors  5/5   Finger flexors  5/5   Dorsal interossei  5/5   Dorsal interossei  5/5   Abductor pollicis  5/5   Abductor pollicis  5/5   Tone (Ashworth scale)  0  Tone (Ashworth scale)  0   Right Lower Extremity:    Left Lower Extremity:    Hip flexors  5/5   Hip flexors  5/5   Hip extensors  5/5   Hip extensors  5/5   Knee flexors  5/5   Knee flexors  5/5   Knee extensors  5/5   Knee extensors  5/5   Dorsiflexors  5/5   Dorsiflexors  5/5   Plantarflexors  5/5   Plantarflexors  5/5   Toe extensors  5/5   Toe extensors  5/5   Toe flexors  5/5   Toe flexors  5/5   Tone (Ashworth scale)  0  Tone (Ashworth scale)  0   MSRs:  Right  Left brachioradialis 2+  brachioradialis 2+  biceps 2+  biceps 2+  triceps 2+  triceps 2+  patellar 3+  patellar 3+  ankle jerk 2+  ankle jerk 2+  Hoffman no  Hoffman no  plantar response down  plantar response down   SENSORY:  Normal and symmetric perception of light touch, pinprick, vibration, and proprioception.  Romberg's sign absent.   COORDINATION/GAIT: Normal finger-to- nose-finger and heel-to-shin.  Intact rapid alternating movements bilaterally.  Able to rise from a chair without using arms.  Gait narrow based and stable. Tandem and stressed gait intact.    IMPRESSION: Ms. Zody is a 54 year-old female referred for evaluation of whole body pain and paresthesias. Her exam is non-focal.  Her symptoms  are migratory and do not conform to an anatomical distribution, making neuropathy unlikely. I will perform EMG of the left side to better characterize the nature of her pain, but my suspicion for neuropathy is very low.  Symptoms are more consistent with a chronic pain vs fibromyalgia presentation. Gabapentin was offered for symptomatic management, but she declined.   Telephone update with results.     The duration of this appointment visit was 40 minutes of face-to-face time with the patient.  Greater than 50% of this time was spent in counseling, explanation of diagnosis, planning of further management, and coordination of care.   Thank you for allowing me to participate in patient's care.  If I can answer any additional questions, I would be pleased to do so.    Sincerely,    Donika K. Allena Katz, DO

## 2014-12-07 ENCOUNTER — Ambulatory Visit (INDEPENDENT_AMBULATORY_CARE_PROVIDER_SITE_OTHER): Payer: BLUE CROSS/BLUE SHIELD | Admitting: Neurology

## 2014-12-07 DIAGNOSIS — R202 Paresthesia of skin: Secondary | ICD-10-CM

## 2014-12-07 DIAGNOSIS — R52 Pain, unspecified: Secondary | ICD-10-CM | POA: Diagnosis not present

## 2014-12-07 DIAGNOSIS — R200 Anesthesia of skin: Secondary | ICD-10-CM

## 2014-12-07 DIAGNOSIS — G894 Chronic pain syndrome: Secondary | ICD-10-CM

## 2014-12-07 DIAGNOSIS — G5602 Carpal tunnel syndrome, left upper limb: Secondary | ICD-10-CM

## 2014-12-07 NOTE — Procedures (Signed)
[facility]  [address] [place], [address]  [place], [place] [address] Tel: [phone] Fax:  [phone] Test Date:  12/07/2014  Patient: Shannon Mckenzie DOB: [birth year] Physician: Nita Sickle, DO  Sex: Female Height: 5\' 2"  Ref Phys: Nita Sickle  ID#: [id number] Temp: 33.2C Technician:    Patient Complaints: This is a 54 year-old female presenting for evaluation of whole body pain and paresthesias.  NCV & EMG Findings: Extensive electrodiagnostic testing of the left upper and lower extremity shows:  1.  Left median sensory response is absent. Left ulnar and radial sensory responses are within normal limits. 2. Left median motor nerve showed prolonged distal onset latency (5.9 ms) with preserved amplitude. Left ulnar motor responses within normal limits. 3. Left sural and superficial peroneal sensory responses are within normal limits. 4. Left peroneal and tibial motor responses are within normal limits. 5. There is no evidence of active or chronic motor axonal loss changes affecting any of the tested muscles. Motor unit configuration and recruitment pattern is within normal limits.  Impression: 1. Left median neuropathy at or distal to the wrist, consistent with clinical diagnosis of carpal tunnel syndrome. Overall, these findings are moderate to severe in degree electrically. 2. There is no evidence of a generalized sensorimotor polyneuropathy, cervical/lumbosacral radiculopathy, or diffuse myopathy affecting the left side.   ___________________________ Nita Sickle, DO    Nerve Conduction Studies Anti Sensory Summary Table   Site NR Peak (ms) Norm Peak (ms) P-T Amp (V) Norm P-T Amp  Left Median Anti Sensory (2nd Digit)  Wrist NR  <3.6  >15  Left Radial Anti Sensory (Base 1st Digit)  Wrist    2.1 <2.7 19.1 >14  Left Sup Peroneal Anti Sensory (Ant Lat Mall)  12 cm    3.0 <4.6 9.5 >4  Left Sural Anti Sensory (Lat Mall)  Calf    3.7 <4.6 8.8 >4  Left Ulnar Anti  Sensory (5th Digit)  Wrist    2.6 <3.1 21.9 >10   Motor Summary Table   Site NR Onset (ms) Norm Onset (ms) O-P Amp (mV) Norm O-P Amp Site1 Site2 Delta-0 (ms) Dist (cm) Vel (m/s) Norm Vel (m/s)  Left Median Motor (Abd Poll Brev)  Wrist    5.9 <4.0 6.6 >6 Elbow Wrist 5.0 27.0 54 >50  Elbow    10.9  6.4         Left Peroneal Motor (Ext Dig Brev)  Ankle    3.2 <6.0 4.8 >2.5 B Fib Ankle 6.9 34.0 49 >40  B Fib    10.1  4.5  Poplt B Fib 1.5 8.0 53 >40  Poplt    11.6  4.4         Left Tibial Motor (Abd Hall Brev)  Ankle    4.2 <6.0 15.6 >4 Knee Ankle 6.9 40.0 58 >40  Knee    11.1  9.7         Left Ulnar Motor (Abd Dig Minimi)  Wrist    2.2 <3.1 9.5 >7 B Elbow Wrist 3.7 23.0 62 >50  B Elbow    5.9  9.0  A Elbow B Elbow 1.8 10.0 56 >50  A Elbow    7.7  8.6          EMG   Side Muscle Ins Act Fibs Psw Fasc Number Recrt Dur Dur. Amp Amp. Poly Poly. Comment  Left 1stDorInt Nml Nml Nml Nml Nml Nml Nml Nml Nml Nml Nml Nml N/A  Left Abd [place]  Brev Nml Nml Nml Nml Nml Nml Nml Nml Nml Nml Nml Nml N/A  Left PronatorTeres Nml Nml Nml Nml Nml Nml Nml Nml Nml Nml Nml Nml N/A  Left Biceps Nml Nml Nml Nml Nml Nml Nml Nml Nml Nml Nml Nml N/A  Left Triceps Nml Nml Nml Nml Nml Nml Nml Nml Nml Nml Nml Nml N/A  Left Deltoid Nml Nml Nml Nml Nml Nml Nml Nml Nml Nml Nml Nml N/A  Left Ext Indicis Nml Nml Nml Nml Nml Nml Nml Nml Nml Nml Nml Nml N/A  Left AntTibialis Nml Nml Nml Nml Nml Nml Nml Nml Nml Nml Nml Nml N/A  Left Gastroc Nml Nml Nml Nml Nml Nml Nml Nml Nml Nml Nml Nml N/A  Left RectFemoris Nml Nml Nml Nml Nml Nml Nml Nml Nml Nml Nml Nml N/A  Left GluteusMed Nml Nml Nml Nml Nml Nml Nml Nml Nml Nml Nml Nml N/A  Left Flex Dig Long Nml Nml Nml Nml Nml Nml Nml Nml Nml Nml Nml Nml N/A      Waveforms:

## 2014-12-27 ENCOUNTER — Encounter: Payer: BLUE CROSS/BLUE SHIELD | Admitting: Neurology

## 2014-12-30 ENCOUNTER — Ambulatory Visit: Payer: BLUE CROSS/BLUE SHIELD | Admitting: Neurology

## 2015-02-08 ENCOUNTER — Other Ambulatory Visit: Payer: Self-pay

## 2015-02-08 MED ORDER — MONTELUKAST SODIUM 10 MG PO TABS: 10.0000 mg | ORAL_TABLET | Freq: Every day | ORAL | Status: DC

## 2015-03-13 ENCOUNTER — Ambulatory Visit: Payer: BLUE CROSS/BLUE SHIELD | Admitting: Family

## 2015-03-13 ENCOUNTER — Ambulatory Visit (INDEPENDENT_AMBULATORY_CARE_PROVIDER_SITE_OTHER)
Admission: RE | Admit: 2015-03-13 | Discharge: 2015-03-13 | Disposition: A | Discharge status: Home / Self Care | Payer: BLUE CROSS/BLUE SHIELD | Source: Ambulatory Visit | Attending: Family | Admitting: Family

## 2015-03-13 ENCOUNTER — Encounter: Payer: Self-pay | Admitting: Family

## 2015-03-13 ENCOUNTER — Ambulatory Visit (INDEPENDENT_AMBULATORY_CARE_PROVIDER_SITE_OTHER): Payer: BLUE CROSS/BLUE SHIELD | Admitting: Family

## 2015-03-13 VITALS — BP 120/74 | HR 74 | Temp 97.9°F | Resp 18 | Ht 62.0 in | Wt 180.0 lb

## 2015-03-13 DIAGNOSIS — M1990 Unspecified osteoarthritis, unspecified site: Secondary | ICD-10-CM

## 2015-03-13 DIAGNOSIS — R05 Cough: Secondary | ICD-10-CM | POA: Diagnosis not present

## 2015-03-13 DIAGNOSIS — R059 Cough, unspecified: Secondary | ICD-10-CM

## 2015-03-13 MED ORDER — AZITHROMYCIN 250 MG PO TABS: ORAL_TABLET | ORAL | Status: DC

## 2015-03-13 NOTE — Assessment & Plan Note (Signed)
Symptoms and exam consistent with potential asthmatic bronchitis. Start azithromycin. Continue over the counter medications as needed for symptom relief and supportive care. Continue current dosage of singular, albuterol and Symbicort. Follow up if symptoms worsen or fail to improve.

## 2015-03-13 NOTE — Patient Instructions (Addendum)
Thank you for choosing [facility].  Summary/Instructions:  Your prescription(s) have been submitted to your pharmacy or been printed and provided for you. Please take as directed and contact our office if you believe you are having problem(s) with the medication(s) or have any questions.  Please stop by radiology on the basement level of the building for your x-rays. Your results will be released to MyChart (or called to you) after review, usually within 72 hours after test completion. If any treatments or changes are necessary, you will be notified at that same time.  Referrals have been made during this visit. You should expect to hear back from our schedulers in about 7-10 days in regards to establishing an appointment with the specialists we discussed.   If your symptoms worsen or fail to improve, please contact our office for further instruction, or in case of emergency go directly to the emergency room at the closest medical facility.   Osteoarthritis Osteoarthritis is a disease that causes soreness and inflammation of a joint. It occurs when the cartilage at the affected joint wears down. Cartilage acts as a cushion, covering the ends of bones where they meet to form a joint. Osteoarthritis is the most common form of arthritis. It often occurs in older people. The joints affected most often by this condition include those in the:  Ends of the fingers.  Thumbs.  Neck.  Lower back.  Knees.  Hips. CAUSES  Over time, the cartilage that covers the ends of bones begins to wear away. This causes bone to rub on bone, producing pain and stiffness in the affected joints.  RISK FACTORS Certain factors can increase your chances of having osteoarthritis, including:  Older age.  Excessive body weight.  Overuse of joints.  Previous joint injury. SIGNS AND SYMPTOMS   Pain, swelling, and stiffness in the joint.  Over time, the joint may lose its normal shape.  Small deposits  of bone (osteophytes) may grow on the edges of the joint.  Bits of bone or cartilage can break off and float inside the joint space. This may cause more pain and damage. DIAGNOSIS  Your health care provider will do a physical exam and ask about your symptoms. Various tests may be ordered, such as:  X-rays of the affected joint.  An MRI scan.  Blood tests to rule out other types of arthritis.  Joint fluid tests. This involves using a needle to draw fluid from the joint and examining the fluid under a microscope. TREATMENT  Goals of treatment are to control pain and improve joint function. Treatment plans may include:  A prescribed exercise program that allows for rest and joint relief.  A weight control plan.  Pain relief techniques, such as:  Properly applied heat and cold.  Electric pulses delivered to nerve endings under the skin (transcutaneous electrical nerve stimulation [TENS]).  Massage.  Certain nutritional supplements.  Medicines to control pain, such as:  Acetaminophen.  Nonsteroidal anti-inflammatory drugs (NSAIDs), such as naproxen.  Narcotic or central-acting agents, such as tramadol.  Corticosteroids. These can be given orally or as an injection.  Surgery to reposition the bones and relieve pain (osteotomy) or to remove loose pieces of bone and cartilage. Joint replacement may be needed in advanced states of osteoarthritis. HOME CARE INSTRUCTIONS   Take medicines only as directed by your health care provider.  Maintain a healthy weight. Follow your health care provider's instructions for weight control. This may include dietary instructions.  Exercise as directed. Your  health care provider can recommend specific types of exercise. These may include:  Strengthening exercises. These are done to strengthen the muscles that support joints affected by arthritis. They can be performed with weights or with exercise bands to add resistance.  Aerobic  activities. These are exercises, such as brisk walking or low-impact aerobics, that get your heart pumping.  Range-of-motion activities. These keep your joints limber.  Balance and agility exercises. These help you maintain daily living skills.  Rest your affected joints as directed by your health care provider.  Keep all follow-up visits as directed by your health care provider. SEEK MEDICAL CARE IF:   Your skin turns red.  You develop a rash in addition to your joint pain.  You have worsening joint pain.  You have a fever along with joint or muscle aches. SEEK IMMEDIATE MEDICAL CARE IF:  You have a significant loss of weight or appetite.  You have night sweats. FOR MORE INFORMATION   National Institute of Arthritis and Musculoskeletal and Skin Diseases: www.niams.http://www.myers.net/  [employer] on Aging: https://walker.com/  American College of Rheumatology: www.rheumatology.org Document Released: 06/10/2005 Document Revised: 10/25/2013 Document Reviewed: 02/15/2013 [facility] Patient Information 2015 [place], [place]. This information is not intended to replace advice given to you by your health care provider. Make sure you discuss any questions you have with your health care provider.

## 2015-03-13 NOTE — Progress Notes (Signed)
Pre visit review using our clinic review tool, if applicable. No additional management support is needed unless otherwise documented below in the visit note. 

## 2015-03-13 NOTE — Progress Notes (Signed)
Subjective:    Patient ID: Shannon Mckenzie, female    DOB: 05/17/1961, 54 y.o.   MRN: [id number]  Chief Complaint  Patient presents with  . Cough    x2 weeks, productive cough, goes through coughing spells and has tried mucinex, hasn't noticed a difference, also has had issues with pain all over body, she is having pain in knees and toes and fingers, thinks that we need xrays    HPI:  Shannon Mckenzie is a 54 y.o. female who  has a past medical history of GERD (gastroesophageal reflux disease); Allergy; Hyperlipidemia; Anxiety; Depression; DJD (degenerative joint disease) of hip; Former smoker; Routine gynecological examination; Hiatal hernia; Vision problems; Hypertension; Reactive airway disease; RLS (restless legs syndrome); Statin intolerance; SOB (shortness of breath) (9/14); Kidney stone; and Asthma. and presents today for a follow up office visit.  1.) Cough - Associated symptom of productive cough with purulent sputum has been going on for about 2 weeks and is refractory to the modifying factor of Mucinex. Timing of symptoms is worse in the morning and occasionally in the evenings. Describes some shortness of breath. Severity of the cough varies with coughing spells.    2.) Whole body pain - Recently seen by neurology and nerve conduction velocity are within the normal ranges with the exception of left median nerve consistent with carpal tunnel syndrome. Believes symptoms are most likely consistent with fibromyalgia or chronic pain. Current pain is described as constant and feels like it is in her bones and joints. Notes the pain is not as bad in the morning and is worse at night following work and notes that her pain improves on the weekends and when she is not at work. Describes how her fingers may be changing with small nodules  Allergies  Allergen Reactions  . Statins     Myalgias with all statins  . Amoxicillin Hives and Rash     Current Outpatient Prescriptions on File Prior to  Visit  Medication Sig Dispense Refill  . albuterol (VENTOLIN HFA) 108 (90 BASE) MCG/ACT inhaler Inhale 2 puffs into the lungs every 6 (six) hours as needed for wheezing. 1 Inhaler 1  . budesonide-formoterol (SYMBICORT) 160-4.5 MCG/ACT inhaler Inhale 2 puffs into the lungs 2 (two) times daily. 10.2 g 5  . Calcium Carb-Cholecalciferol (CALCIUM + D3 PO) Take by mouth.    . esomeprazole (NEXIUM) 40 MG capsule Take 1 capsule (40 mg total) by mouth 2 (two) times daily. 180 capsule 3  . ezetimibe (ZETIA) 10 MG tablet Take 1 tablet (10 mg total) by mouth daily. 90 tablet 3  . ferrous fumarate (HEMOCYTE - 106 MG FE) 325 (106 FE) MG TABS tablet Take 1 tablet by mouth.    Marland Kitchen glucosamine-chondroitin 500-400 MG tablet Take 1 tablet by mouth 3 (three) times daily.    Marland Kitchen ibuprofen (ADVIL,MOTRIN) 800 MG tablet Take 1 tablet (800 mg total) by mouth 3 (three) times daily. 21 tablet 0  . losartan (COZAAR) 50 MG tablet Take 1 tablet (50 mg total) by mouth daily. 90 tablet 3  . montelukast (SINGULAIR) 10 MG tablet Take 1 tablet (10 mg total) by mouth at bedtime. 30 tablet 2  . Multiple Vitamins-Minerals (MULTIVITAMIN WITH MINERALS) tablet Take 1 tablet by mouth daily.    Marland Kitchen rOPINIRole (REQUIP) 0.5 MG tablet take 1 tablet by mouth at bedtime 90 tablet 3  . sertraline (ZOLOFT) 100 MG tablet take 1 and 1/2 tablets by mouth at bedtime 135 tablet 1  . triamcinolone (NASACORT)  55 MCG/ACT nasal inhaler Place 2 sprays into the nose daily.     No current facility-administered medications on file prior to visit.    Past Medical History  Diagnosis Date  . GERD (gastroesophageal reflux disease)   . Allergy   . Hyperlipidemia   . Anxiety   . Depression   . DJD (degenerative joint disease) of hip   . Former smoker   . Routine gynecological examination     sees gynecology, [name] NP  . Hiatal hernia   . Vision problems     wears contact lenses  . Hypertension   . Reactive airway disease   . RLS (restless legs  syndrome)   . Statin intolerance   . SOB (shortness of breath) 9/14    cardiac eval with stress test and echo; Dr. [name]  . Kidney stone   . Asthma     Past Surgical History  Procedure Laterality Date  . Colonoscopy  06/2012    Dr. Ramon Dredge; repeat 10 years  . Upper gi endoscopy N/A 07/24/2012    Hiatus Hernia,normal stomach, normal examined duodenum    Review of Systems  Constitutional: Negative for fever and chills.  HENT: Negative for congestion, sinus pressure, sneezing and sore throat.   Respiratory: Positive for cough and shortness of breath.   Neurological: Negative for headaches.      Objective:    BP 120/74 mmHg  Pulse 74  Temp(Src) 97.9 F (36.6 C) (Oral)  Resp 18  Ht  (1.575 m)  Wt 180 lb (81.647 kg)  BMI 32.91 kg/m2  SpO2 95%  LMP 03/13/2015 Nursing note and vital signs reviewed.  Physical Exam  Constitutional: She is oriented to person, place, and time. She appears well-developed and well-nourished. No distress.  HENT:  Right Ear: Hearing, tympanic membrane, external ear and ear canal normal.  Left Ear: Hearing, tympanic membrane, external ear and ear canal normal.  Nose: Right sinus exhibits no maxillary sinus tenderness and no frontal sinus tenderness. Left sinus exhibits no maxillary sinus tenderness and no frontal sinus tenderness.  Mouth/Throat: Uvula is midline, oropharynx is clear and moist and mucous membranes are normal.  Cardiovascular: Normal rate, regular rhythm, normal heart sounds and intact distal pulses.   Pulmonary/Chest: Effort normal and breath sounds normal.  Musculoskeletal:  Bilateral hands with no obvious discoloration or edema noted. Possible early presentation of Heberden's nodes. Range of motion is intact and appropriate and pulses and capillary refill are normal.   Neurological: She is alert and oriented to person, place, and time.  Skin: Skin is warm and dry.  Psychiatric: She has a normal mood and affect. Her  behavior is normal. Judgment and thought content normal.       Assessment & Plan:   Problem List Items Addressed This Visit      Musculoskeletal and Integument   Osteoarthritis - Primary    Pain described is consistent with osteoarthritis and further support is possible development of early Heberden's nodes. Obtain hand x-rays. Discussed pain management and possible reduction in work to help with symptoms. Refer to physical therapy per patient request. Refer to rheumatology. Continue current dosage of ibuprofen as needed. Follow up pending PT and rheumatology appointments.        Relevant Orders   Ambulatory referral to Rheumatology   DG Hand Complete Left (Completed)   Ambulatory referral to Physical Therapy   DG Hand Complete Right (Completed)     Other   Cough    Symptoms  and exam consistent with potential asthmatic bronchitis. Start azithromycin. Continue over the counter medications as needed for symptom relief and supportive care. Continue current dosage of singular, albuterol and Symbicort. Follow up if symptoms worsen or fail to improve.       Relevant Medications   azithromycin (ZITHROMAX) 250 MG tablet

## 2015-03-13 NOTE — Assessment & Plan Note (Signed)
Pain described is consistent with osteoarthritis and further support is possible development of early Heberden's nodes. Obtain hand x-rays. Discussed pain management and possible reduction in work to help with symptoms. Refer to physical therapy per patient request. Refer to rheumatology. Continue current dosage of ibuprofen as needed. Follow up pending PT and rheumatology appointments.

## 2015-03-14 ENCOUNTER — Telehealth: Payer: Self-pay | Admitting: Family

## 2015-03-14 DIAGNOSIS — R05 Cough: Secondary | ICD-10-CM

## 2015-03-14 DIAGNOSIS — R059 Cough, unspecified: Secondary | ICD-10-CM

## 2015-03-14 MED ORDER — AZITHROMYCIN 250 MG PO TABS: ORAL_TABLET | ORAL | Status: DC

## 2015-03-14 NOTE — Telephone Encounter (Signed)
Rx resent.

## 2015-03-14 NOTE — Telephone Encounter (Signed)
Patient called stating the pharmacy never received the prescription for azithromycin (ZITHROMAX) 250 MG tablet [[id number]]  She was there last night around 7pm. Can you please resend this prescription

## 2015-04-07 ENCOUNTER — Other Ambulatory Visit: Payer: Self-pay | Admitting: Family

## 2015-04-07 NOTE — Telephone Encounter (Signed)
Pt called to check up on this request, pt is out of this med and wondering if she can get this refill today, she is out.

## 2015-04-10 ENCOUNTER — Other Ambulatory Visit: Payer: Self-pay | Admitting: Family

## 2015-05-26 ENCOUNTER — Other Ambulatory Visit: Payer: Self-pay | Admitting: Family

## 2015-06-05 ENCOUNTER — Ambulatory Visit (INDEPENDENT_AMBULATORY_CARE_PROVIDER_SITE_OTHER): Payer: BLUE CROSS/BLUE SHIELD | Admitting: Family

## 2015-06-05 ENCOUNTER — Encounter: Payer: Self-pay | Admitting: Family

## 2015-06-05 VITALS — BP 160/102 | HR 68 | Temp 97.8°F | Resp 16 | Ht 62.0 in | Wt 176.8 lb

## 2015-06-05 DIAGNOSIS — J209 Acute bronchitis, unspecified: Secondary | ICD-10-CM | POA: Diagnosis not present

## 2015-06-05 DIAGNOSIS — J40 Bronchitis, not specified as acute or chronic: Secondary | ICD-10-CM | POA: Insufficient documentation

## 2015-06-05 MED ORDER — TRAMADOL HCL 50 MG PO TABS: 50.0000 mg | ORAL_TABLET | Freq: Three times a day (TID) | ORAL | Status: DC | PRN

## 2015-06-05 MED ORDER — PREDNISONE 20 MG PO TABS: 20.0000 mg | ORAL_TABLET | Freq: Every day | ORAL | Status: DC

## 2015-06-05 MED ORDER — CEFDINIR 300 MG PO CAPS: 300.0000 mg | ORAL_CAPSULE | Freq: Two times a day (BID) | ORAL | Status: DC

## 2015-06-05 MED ORDER — BENZONATATE 100 MG PO CAPS: 100.0000 mg | ORAL_CAPSULE | Freq: Two times a day (BID) | ORAL | Status: DC | PRN

## 2015-06-05 NOTE — Assessment & Plan Note (Signed)
Symptoms and exam consistent with bronchitis refractory to azithromycin. Start Cefdinir. Start prednisone as needed for wheezing. Start Tessalon as needed for cough. Continue over-the-counter medications as needed for symptom relief and supportive care. Start tramadol as needed for cough at night. Follow-up if symptoms worsen or fail to improve.

## 2015-06-05 NOTE — Progress Notes (Signed)
Pre visit review using our clinic review tool, if applicable. No additional management support is needed unless otherwise documented below in the visit note. 

## 2015-06-05 NOTE — Progress Notes (Signed)
Subjective:    Patient ID: Shannon Mckenzie, female    DOB: [birth year], 54 y.o.   MRN: [id number]  Chief Complaint  Patient presents with  . Cough    x1 month, SOB and cough, coughing up some brown phlem, she states in the past cefdinir worked good, refill of tramadol    HPI:  Shannon Mckenzie is a 54 y.o. female who  has a past medical history of GERD (gastroesophageal reflux disease); Allergy; Hyperlipidemia; Anxiety; Depression; DJD (degenerative joint disease) of hip; Former smoker; Routine gynecological examination; Hiatal hernia; Vision problems; Hypertension; Reactive airway disease; RLS (restless legs syndrome); Statin intolerance; SOB (shortness of breath) (9/14); Kidney stone; and Asthma. and presents today for an acute office visit.  This is a new problem. Associated symptom of shortness of breath and productive cough with purulent sputum, and congestion has been going on for about 1 month. Modifying factors include azithromycin which did not help with her symptoms. Reports that she does get better, but then gets sick again. Denies fevers or sore throat. Timing of the symptoms are worse in the morning.   Allergies  Allergen Reactions  . Statins     Myalgias with all statins  . Amoxicillin Hives and Rash     Current Outpatient Prescriptions on File Prior to Visit  Medication Sig Dispense Refill  . albuterol (VENTOLIN HFA) 108 (90 BASE) MCG/ACT inhaler Inhale 2 puffs into the lungs every 6 (six) hours as needed for wheezing. 1 Inhaler 1  . Calcium Carb-Cholecalciferol (CALCIUM + D3 PO) Take by mouth.    . esomeprazole (NEXIUM) 40 MG capsule Take 1 capsule (40 mg total) by mouth 2 (two) times daily. 180 capsule 3  . ezetimibe (ZETIA) 10 MG tablet Take 1 tablet (10 mg total) by mouth daily. 90 tablet 3  . ferrous fumarate (HEMOCYTE - 106 MG FE) 325 (106 FE) MG TABS tablet Take 1 tablet by mouth.    Marland Kitchen. glucosamine-chondroitin 500-400 MG tablet Take 1 tablet by mouth 3 (three) times  daily.    Marland Kitchen. ibuprofen (ADVIL,MOTRIN) 800 MG tablet Take 1 tablet (800 mg total) by mouth 3 (three) times daily. 21 tablet 0  . losartan (COZAAR) 50 MG tablet Take 1 tablet (50 mg total) by mouth daily. 90 tablet 3  . montelukast (SINGULAIR) 10 MG tablet take 1 tablet by mouth at bedtime 30 tablet 2  . Multiple Vitamins-Minerals (MULTIVITAMIN WITH MINERALS) tablet Take 1 tablet by mouth daily.    Marland Kitchen. rOPINIRole (REQUIP) 0.5 MG tablet take 1 tablet by mouth at bedtime 90 tablet 3  . sertraline (ZOLOFT) 100 MG tablet take 1 and 1/2 tablets by mouth at bedtime 135 tablet 1  . SYMBICORT 160-4.5 MCG/ACT inhaler INHALE 2 PUFFS INTO THE LUNGS TWICE A DAY 10.2 Inhaler 5  . SYMBICORT 160-4.5 MCG/ACT inhaler INHALE 2 PUFFS INTO THE LUNGS TWICE A DAY 10.2 Inhaler 5  . triamcinolone (NASACORT) 55 MCG/ACT nasal inhaler Place 2 sprays into the nose daily.     No current facility-administered medications on file prior to visit.     Past Surgical History  Procedure Laterality Date  . Colonoscopy  06/2012    Dr. Ramon DredgeEdward; repeat 10 years  . Upper gi endoscopy N/A 07/24/2012    Hiatus Hernia,normal stomach, normal examined duodenum    Review of Systems  Constitutional: Negative for fever and chills.  HENT: Positive for congestion. Negative for sinus pressure and sore throat.   Respiratory: Positive for cough and shortness of breath.  Neurological: Positive for headaches.      Objective:    BP 160/102 mmHg  Pulse 68  Temp(Src) 97.8 F (36.6 C) (Oral)  Resp 16  Ht  (1.575 m)  Wt 176 lb 12.8 oz (80.196 kg)  BMI 32.33 kg/m2  SpO2 97% Nursing note and vital signs reviewed.  Physical Exam  Constitutional: She is oriented to person, place, and time. She appears well-developed and well-nourished. No distress.  HENT:  Right Ear: Hearing, tympanic membrane, external ear and ear canal normal.  Nose: Nose normal.  Mouth/Throat: Uvula is midline, oropharynx is clear and moist and mucous membranes  are normal.  Cardiovascular: Normal rate, regular rhythm, normal heart sounds and intact distal pulses.   Pulmonary/Chest: Effort normal. She has wheezes.  Neurological: She is alert and oriented to person, place, and time.  Skin: Skin is warm and dry.  Psychiatric: She has a normal mood and affect. Her behavior is normal. Judgment and thought content normal.       Assessment & Plan:   Problem List Items Addressed This Visit      Respiratory   Acute bronchitis - Primary    Symptoms and exam consistent with bronchitis refractory to azithromycin. Start Cefdinir. Start prednisone as needed for wheezing. Start Tessalon as needed for cough. Continue over-the-counter medications as needed for symptom relief and supportive care. Start tramadol as needed for cough at night. Follow-up if symptoms worsen or fail to improve.      Relevant Medications   cefdinir (OMNICEF) 300 MG capsule   predniSONE (DELTASONE) 20 MG tablet   benzonatate (TESSALON) 100 MG capsule   traMADol (ULTRAM) 50 MG tablet

## 2015-06-05 NOTE — Patient Instructions (Addendum)
Thank you for choosing [facility].  Summary/Instructions:  Your prescription(s) have been submitted to your pharmacy or been printed and provided for you. Please take as directed and contact our office if you believe you are having problem(s) with the medication(s) or have any questions.  If your symptoms worsen or fail to improve, please contact our office for further instruction, or in case of emergency go directly to the emergency room at the closest medical facility.   General Recommendations:    Please drink plenty of fluids.  Get plenty of rest   Sleep in humidified air  Use saline nasal sprays  Netti pot   OTC Medications:  Decongestants - helps relieve congestion   Flonase (generic fluticasone) or Nasacort (generic triamcinolone) - please make sure to use the "cross-over" technique at a 45 degree angle towards the opposite eye as opposed to straight up the nasal passageway.   Sudafed (generic pseudoephedrine - Note this is the one that is available behind the pharmacy counter); Products with phenylephrine (-PE) may also be used but is often not as effective as pseudoephedrine.   If you have HIGH BLOOD PRESSURE - Coricidin HBP; AVOID any product that is -D as this contains pseudoephedrine which may increase your blood pressure.  Afrin (oxymetazoline) every 6-8 hours for up to 3 days.   Allergies - helps relieve runny nose, itchy eyes and sneezing   Claritin (generic loratidine), Allegra (fexofenidine), or Zyrtec (generic cyrterizine) for runny nose. These medications should not cause drowsiness.  Note - Benadryl (generic diphenhydramine) may be used however may cause drowsiness  Cough -   Delsym or Robitussin (generic dextromethorphan)  Expectorants - helps loosen mucus to ease removal   Mucinex (generic guaifenesin) as directed on the package.  Headaches / General Aches   Tylenol (generic acetaminophen) - DO NOT EXCEED 3 grams (3,000 mg) in a 24  hour time period  Advil/Motrin (generic ibuprofen)   Sore Throat -   Salt water gargle   Chloraseptic (generic benzocaine) spray or lozenges / Sucrets (generic dyclonine)   Acute Bronchitis Bronchitis is inflammation of the airways that extend from the windpipe into the lungs (bronchi). The inflammation often causes mucus to develop. This leads to a cough, which is the most common symptom of bronchitis.  In acute bronchitis, the condition usually develops suddenly and goes away over time, usually in a couple weeks. Smoking, allergies, and asthma can make bronchitis worse. Repeated episodes of bronchitis may cause further lung problems.  CAUSES Acute bronchitis is most often caused by the same virus that causes a cold. The virus can spread from person to person (contagious) through coughing, sneezing, and touching contaminated objects. SIGNS AND SYMPTOMS   Cough.   Fever.   Coughing up mucus.   Body aches.   Chest congestion.   Chills.   Shortness of breath.   Sore throat.  DIAGNOSIS  Acute bronchitis is usually diagnosed through a physical exam. Your health care provider will also ask you questions about your medical history. Tests, such as chest X-rays, are sometimes done to rule out other conditions.  TREATMENT  Acute bronchitis usually goes away in a couple weeks. Oftentimes, no medical treatment is necessary. Medicines are sometimes given for relief of fever or cough. Antibiotic medicines are usually not needed but may be prescribed in certain situations. In some cases, an inhaler may be recommended to help reduce shortness of breath and control the cough. A cool mist vaporizer may also be used to help thin   bronchial secretions and make it easier to clear the chest.  HOME CARE INSTRUCTIONS  Get plenty of rest.   Drink enough fluids to keep your urine clear or pale yellow (unless you have a medical condition that requires fluid restriction). Increasing fluids may  help thin your respiratory secretions (sputum) and reduce chest congestion, and it will prevent dehydration.   Take medicines only as directed by your health care provider.  If you were prescribed an antibiotic medicine, finish it all even if you start to feel better.  Avoid smoking and secondhand smoke. Exposure to cigarette smoke or irritating chemicals will make bronchitis worse. If you are a smoker, consider using nicotine gum or skin patches to help control withdrawal symptoms. Quitting smoking will help your lungs heal faster.   Reduce the chances of another bout of acute bronchitis by washing your hands frequently, avoiding people with cold symptoms, and trying not to touch your hands to your mouth, nose, or eyes.   Keep all follow-up visits as directed by your health care provider.  SEEK MEDICAL CARE IF: Your symptoms do not improve after 1 week of treatment.  SEEK IMMEDIATE MEDICAL CARE IF:  You develop an increased fever or chills.   You have chest pain.   You have severe shortness of breath.  You have bloody sputum.   You develop dehydration.  You faint or repeatedly feel like you are going to pass out.  You develop repeated vomiting.  You develop a severe headache. MAKE SURE YOU:   Understand these instructions.  Will watch your condition.  Will get help right away if you are not doing well or get worse.   This information is not intended to replace advice given to you by your health care provider. Make sure you discuss any questions you have with your health care provider.   Document Released: 07/18/2004 Document Revised: 07/01/2014 Document Reviewed: 12/01/2012 Elsevier Interactive Patient Education 2016 [employer]    

## 2015-07-26 ENCOUNTER — Other Ambulatory Visit: Payer: Self-pay

## 2015-07-26 MED ORDER — LOSARTAN POTASSIUM 50 MG PO TABS: 50.0000 mg | ORAL_TABLET | Freq: Every day | ORAL | Status: DC

## 2015-07-27 ENCOUNTER — Telehealth: Payer: Self-pay

## 2015-07-27 ENCOUNTER — Other Ambulatory Visit: Payer: Self-pay

## 2015-07-27 DIAGNOSIS — J209 Acute bronchitis, unspecified: Secondary | ICD-10-CM

## 2015-07-27 MED ORDER — EZETIMIBE 10 MG PO TABS: 10.0000 mg | ORAL_TABLET | Freq: Every day | ORAL | Status: DC

## 2015-07-27 MED ORDER — LOSARTAN POTASSIUM 50 MG PO TABS: 50.0000 mg | ORAL_TABLET | Freq: Every day | ORAL | Status: DC

## 2015-07-27 MED ORDER — TRAMADOL HCL 50 MG PO TABS: 50.0000 mg | ORAL_TABLET | Freq: Three times a day (TID) | ORAL | Status: DC | PRN

## 2015-07-27 NOTE — Telephone Encounter (Signed)
Pt would like refill of tramadol last refill was 06/05/15

## 2015-07-27 NOTE — Telephone Encounter (Signed)
Medication printed to be faxed.  

## 2015-07-28 NOTE — Telephone Encounter (Signed)
Rx sent 

## 2015-08-24 ENCOUNTER — Other Ambulatory Visit: Payer: Self-pay

## 2015-08-24 MED ORDER — MONTELUKAST SODIUM 10 MG PO TABS: 10.0000 mg | ORAL_TABLET | Freq: Every day | ORAL | Status: DC

## 2015-08-25 ENCOUNTER — Ambulatory Visit (HOSPITAL_BASED_OUTPATIENT_CLINIC_OR_DEPARTMENT_OTHER)
Admission: RE | Admit: 2015-08-25 | Discharge: 2015-08-25 | Disposition: A | Discharge status: Home / Self Care | Payer: BLUE CROSS/BLUE SHIELD | Source: Ambulatory Visit | Attending: Medical | Admitting: Medical

## 2015-08-25 ENCOUNTER — Encounter: Payer: Self-pay | Admitting: Medical

## 2015-08-25 ENCOUNTER — Ambulatory Visit (INDEPENDENT_AMBULATORY_CARE_PROVIDER_SITE_OTHER): Payer: BLUE CROSS/BLUE SHIELD | Admitting: Medical

## 2015-08-25 VITALS — BP 130/90 | HR 78 | Temp 98.2°F | Ht 62.0 in | Wt 178.6 lb

## 2015-08-25 DIAGNOSIS — R062 Wheezing: Secondary | ICD-10-CM | POA: Diagnosis not present

## 2015-08-25 DIAGNOSIS — H6691 Otitis media, unspecified, right ear: Secondary | ICD-10-CM | POA: Diagnosis not present

## 2015-08-25 DIAGNOSIS — J209 Acute bronchitis, unspecified: Secondary | ICD-10-CM

## 2015-08-25 DIAGNOSIS — R05 Cough: Secondary | ICD-10-CM | POA: Diagnosis present

## 2015-08-25 MED ORDER — PREDNISONE 10 MG PO TABS: ORAL_TABLET | ORAL | Status: DC

## 2015-08-25 MED ORDER — CEFDINIR 300 MG PO CAPS: 300.0000 mg | ORAL_CAPSULE | Freq: Two times a day (BID) | ORAL | Status: DC

## 2015-08-25 MED ORDER — BENZONATATE 100 MG PO CAPS: 100.0000 mg | ORAL_CAPSULE | Freq: Two times a day (BID) | ORAL | Status: DC | PRN

## 2015-08-25 MED ORDER — FLUTICASONE PROPIONATE 50 MCG/ACT NA SUSP: 2.0000 | Freq: Every day | NASAL | Status: DC

## 2015-08-25 NOTE — Patient Instructions (Signed)
For OM and bronchitis rx of cefdnir.  For wheezing continue symbicort and use your albuterol if needed. Taper dose of prednisone rx as well.  For nasal congestion will rx flonase.  Please get cxr today.  Follow up in 7 days or as needed

## 2015-08-25 NOTE — Progress Notes (Signed)
Subjective:    Patient ID: Shannon Mckenzie, female    DOB: 24-May-1961, 55 y.o.   MRN: [id number]  HPI  Pt in with nasal congestion and sinus pressure. Overall all 2-3 days feeling sick. Some runny nose. Some producitve cough. She states hx of sinusitis and bronchitis. Gets sick pretty easily/quickly per her report.  No fever, no chills, no sweats or body aches.  Pt has used cefdnr antibitoic and no reaction.    Review of Systems  Constitutional: Negative for fever, chills and fatigue.  Respiratory: Positive for cough. Negative for wheezing.        Occasional feels winded. She is on symbicort. Pt not diabetic. She has albuterol but has not been taking.  Neurological: Negative for dizziness and headaches.  Hematological: Negative for adenopathy. Does not bruise/bleed easily.  Psychiatric/Behavioral: Negative for behavioral problems and confusion.    Past Medical History  Diagnosis Date  . GERD (gastroesophageal reflux disease)   . Allergy   . Hyperlipidemia   . Anxiety   . Depression   . DJD (degenerative joint disease) of hip   . Former smoker   . Routine gynecological examination     sees gynecology, [name] NP  . Hiatal hernia   . Vision problems     wears contact lenses  . Hypertension   . Reactive airway disease   . RLS (restless legs syndrome)   . Statin intolerance   . SOB (shortness of breath) 9/14    cardiac eval with stress test and echo; Dr. Viann FishSpencer Tilley  . Kidney stone   . Asthma     Social History   Social History  . Marital Status: Married    Spouse Name: N/A  . Number of Children: 1  . Years of Education: 9   Occupational History  . Housekeeping    Social History Main Topics  . Smoking status: Former Smoker -- 1.00 packs/day for 15 years    Types: Cigarettes    Quit date: 06/25/1991  . Smokeless tobacco: Never Used  . Alcohol Use: No  . Drug Use: No  . Sexual Activity: Not Currently   Other Topics Concern  . Not on file   Social  History Narrative   Married, 1 son, no exercise other than on the job, [employer]   Fun: Watch movies, be outside and work in the yard, photography   Denies any religious beliefs that would effect health care.     Past Surgical History  Procedure Laterality Date  . Colonoscopy  06/2012    Dr. Ramon DredgeEdward; repeat 10 years  . Upper gi endoscopy N/A 07/24/2012    [place] Hernia,normal stomach, normal examined duodenum    Family History  Problem Relation Age of Onset  . Arthritis Mother   . Asthma Mother   . Diabetes Mother   . Cirrhosis Mother     died of liver nonalcoholic cirrhosis  . COPD Mother   . Heart disease Mother     CHF  . Asthma Sister   . Cancer Father     died of pancreatic cancer  . Depression Brother   . Stroke Maternal Aunt     died age 54yo of CVA  . Other Brother     died of MVA    Allergies  Allergen Reactions  . Statins     Myalgias with all statins  . Amoxicillin Hives and Rash    Current Outpatient Prescriptions on File Prior to Visit  Medication Sig Dispense Refill  .  albuterol (VENTOLIN HFA) 108 (90 BASE) MCG/ACT inhaler Inhale 2 puffs into the lungs every 6 (six) hours as needed for wheezing. 1 Inhaler 1  . Calcium Carb-Cholecalciferol (CALCIUM + D3 PO) Take by mouth.    . cefdinir (OMNICEF) 300 MG capsule Take 1 capsule (300 mg total) by mouth 2 (two) times daily. 20 capsule 0  . esomeprazole (NEXIUM) 40 MG capsule Take 1 capsule (40 mg total) by mouth 2 (two) times daily. 180 capsule 3  . ezetimibe (ZETIA) 10 MG tablet Take 1 tablet (10 mg total) by mouth daily. 90 tablet 3  . ferrous fumarate (HEMOCYTE - 106 MG FE) 325 (106 FE) MG TABS tablet Take 1 tablet by mouth.    Marland Kitchen glucosamine-chondroitin 500-400 MG tablet Take 1 tablet by mouth 3 (three) times daily.    Marland Kitchen ibuprofen (ADVIL,MOTRIN) 800 MG tablet Take 1 tablet (800 mg total) by mouth 3 (three) times daily. 21 tablet 0  . losartan (COZAAR) 50 MG tablet Take 1 tablet (50 mg total) by mouth daily.  90 tablet 3  . montelukast (SINGULAIR) 10 MG tablet Take 1 tablet (10 mg total) by mouth at bedtime. 30 tablet 2  . Multiple Vitamins-Minerals (MULTIVITAMIN WITH MINERALS) tablet Take 1 tablet by mouth daily.    Marland Kitchen rOPINIRole (REQUIP) 0.5 MG tablet take 1 tablet by mouth at bedtime 90 tablet 3  . sertraline (ZOLOFT) 100 MG tablet take 1 and 1/2 tablets by mouth at bedtime 135 tablet 1  . SYMBICORT 160-4.5 MCG/ACT inhaler INHALE 2 PUFFS INTO THE LUNGS TWICE A DAY 10.2 Inhaler 5  . traMADol (ULTRAM) 50 MG tablet Take 1 tablet (50 mg total) by mouth every 8 (eight) hours as needed. 30 tablet 0  . triamcinolone (NASACORT) 55 MCG/ACT nasal inhaler Place 2 sprays into the nose daily.     No current facility-administered medications on file prior to visit.    BP 130/90 mmHg  Pulse 78  Temp(Src) 98.2 F (36.8 C) (Oral)  Ht  (1.575 m)  Wt 178 lb 9.6 oz (81.012 kg)  BMI 32.66 kg/m2  SpO2 98%       Objective:   Physical Exam  General  Mental Status - Alert. General Appearance - Well groomed. Not in acute distress.  Skin Rashes- No Rashes.  HEENT Head- Normal. Ear Auditory Canal - Left- Normal. Right - Normal.Tympanic Membrane- Left- Normal. Right- bright red. Eye Sclera/Conjunctiva- Left- Normal. Right- Normal. Nose & Sinuses Nasal Mucosa- Left-  Boggy and Congested. Right-  Boggy and  Congested.Bilateral  maxillary and frontal sinus pressure. Mouth & Throat Lips: Upper Lip- Normal: no dryness, cracking, pallor, cyanosis, or vesicular eruption. Lower Lip-Normal: no dryness, cracking, pallor, cyanosis or vesicular eruption. Buccal Mucosa- Bilateral- No Aphthous ulcers. Oropharynx- No Discharge or Erythema. Tonsils: Characteristics- Bilateral- No Erythema or Congestion. Size/Enlargement- Bilateral- No enlargement. Discharge- bilateral-None.  Neck Neck- Supple. No Masses.   Chest and Lung Exam Auscultation: Breath Sounds:- even and unlabored. Some scattered rhonchi and  wheezing.  Cardiovascular Auscultation:Rythm- Regular, rate and rhythm. Murmurs & Other Heart Sounds:Ausculatation of the heart reveal- No Murmurs.  Lymphatic Head & Neck General Head & Neck Lymphatics: Bilateral: Description- No Localized lymphadenopathy.       Assessment & Plan:  For OM and bronchitis rx of cefdnir.  For wheezing continue symbicort and use your albuterol if needed. Taper dose of prednisone rx as well.  For nasal congestion will rx flonase.  Please get cxr today.  Follow up in 7 days or  as needed

## 2015-08-25 NOTE — Progress Notes (Signed)
Pre visit review using our clinic review tool, if applicable. No additional management support is needed unless otherwise documented below in the visit note. 

## 2015-10-04 ENCOUNTER — Telehealth: Payer: Self-pay

## 2015-10-04 MED ORDER — SERTRALINE HCL 100 MG PO TABS: ORAL_TABLET | ORAL | Status: DC

## 2015-10-04 NOTE — Telephone Encounter (Signed)
Medication sent.

## 2015-10-04 NOTE — Telephone Encounter (Signed)
Requesting refill of sertraline. Last refill was 07/11/2014. Please advise

## 2015-10-31 DIAGNOSIS — M7062 Trochanteric bursitis, left hip: Secondary | ICD-10-CM | POA: Diagnosis not present

## 2015-10-31 DIAGNOSIS — M79671 Pain in right foot: Secondary | ICD-10-CM | POA: Diagnosis not present

## 2015-10-31 DIAGNOSIS — M19041 Primary osteoarthritis, right hand: Secondary | ICD-10-CM | POA: Diagnosis not present

## 2015-10-31 DIAGNOSIS — M25512 Pain in left shoulder: Secondary | ICD-10-CM | POA: Diagnosis not present

## 2015-10-31 DIAGNOSIS — M79672 Pain in left foot: Secondary | ICD-10-CM | POA: Diagnosis not present

## 2015-11-27 ENCOUNTER — Other Ambulatory Visit: Payer: Self-pay

## 2015-11-27 MED ORDER — MONTELUKAST SODIUM 10 MG PO TABS: 10.0000 mg | ORAL_TABLET | Freq: Every day | ORAL | Status: DC

## 2015-12-06 ENCOUNTER — Other Ambulatory Visit: Payer: Self-pay | Admitting: Medical

## 2015-12-13 DIAGNOSIS — M79671 Pain in right foot: Secondary | ICD-10-CM | POA: Diagnosis not present

## 2015-12-13 DIAGNOSIS — Z1151 Encounter for screening for human papillomavirus (HPV): Secondary | ICD-10-CM | POA: Diagnosis not present

## 2015-12-13 DIAGNOSIS — N951 Menopausal and female climacteric states: Secondary | ICD-10-CM | POA: Diagnosis not present

## 2015-12-13 DIAGNOSIS — Z6833 Body mass index (BMI) 33.0-33.9, adult: Secondary | ICD-10-CM | POA: Diagnosis not present

## 2015-12-13 DIAGNOSIS — Z1389 Encounter for screening for other disorder: Secondary | ICD-10-CM | POA: Diagnosis not present

## 2015-12-13 DIAGNOSIS — Z79890 Hormone replacement therapy (postmenopausal): Secondary | ICD-10-CM | POA: Diagnosis not present

## 2015-12-13 DIAGNOSIS — M7062 Trochanteric bursitis, left hip: Secondary | ICD-10-CM | POA: Diagnosis not present

## 2015-12-13 DIAGNOSIS — M7532 Calcific tendinitis of left shoulder: Secondary | ICD-10-CM | POA: Diagnosis not present

## 2015-12-13 DIAGNOSIS — Z1231 Encounter for screening mammogram for malignant neoplasm of breast: Secondary | ICD-10-CM | POA: Diagnosis not present

## 2015-12-13 DIAGNOSIS — Z124 Encounter for screening for malignant neoplasm of cervix: Secondary | ICD-10-CM | POA: Diagnosis not present

## 2015-12-13 DIAGNOSIS — Z130 Encounter for screening for diseases of the blood and blood-forming organs and certain disorders involving the immune mechanism: Secondary | ICD-10-CM | POA: Diagnosis not present

## 2015-12-13 DIAGNOSIS — M79672 Pain in left foot: Secondary | ICD-10-CM | POA: Diagnosis not present

## 2015-12-13 DIAGNOSIS — Z01419 Encounter for gynecological examination (general) (routine) without abnormal findings: Secondary | ICD-10-CM | POA: Diagnosis not present

## 2016-01-11 DIAGNOSIS — M79672 Pain in left foot: Secondary | ICD-10-CM | POA: Diagnosis not present

## 2016-01-11 DIAGNOSIS — M7062 Trochanteric bursitis, left hip: Secondary | ICD-10-CM | POA: Diagnosis not present

## 2016-01-11 DIAGNOSIS — M130 Polyarthritis, unspecified: Secondary | ICD-10-CM | POA: Diagnosis not present

## 2016-01-11 DIAGNOSIS — M79671 Pain in right foot: Secondary | ICD-10-CM | POA: Diagnosis not present

## 2016-01-16 DIAGNOSIS — Z79899 Other long term (current) drug therapy: Secondary | ICD-10-CM | POA: Diagnosis not present

## 2016-01-16 DIAGNOSIS — M130 Polyarthritis, unspecified: Secondary | ICD-10-CM | POA: Diagnosis not present

## 2016-01-16 DIAGNOSIS — M7062 Trochanteric bursitis, left hip: Secondary | ICD-10-CM | POA: Diagnosis not present

## 2016-01-17 ENCOUNTER — Encounter: Payer: Self-pay | Admitting: Family

## 2016-01-17 ENCOUNTER — Ambulatory Visit (INDEPENDENT_AMBULATORY_CARE_PROVIDER_SITE_OTHER): Payer: BLUE CROSS/BLUE SHIELD | Admitting: Family

## 2016-01-17 ENCOUNTER — Ambulatory Visit (INDEPENDENT_AMBULATORY_CARE_PROVIDER_SITE_OTHER)
Admission: RE | Admit: 2016-01-17 | Discharge: 2016-01-17 | Disposition: A | Discharge status: Home / Self Care | Payer: BLUE CROSS/BLUE SHIELD | Source: Ambulatory Visit | Attending: Family | Admitting: Family

## 2016-01-17 ENCOUNTER — Other Ambulatory Visit: Payer: Self-pay | Admitting: Family

## 2016-01-17 ENCOUNTER — Other Ambulatory Visit (INDEPENDENT_AMBULATORY_CARE_PROVIDER_SITE_OTHER): Payer: BLUE CROSS/BLUE SHIELD

## 2016-01-17 VITALS — BP 132/80 | HR 81 | Temp 97.9°F | Resp 16 | Ht 62.0 in | Wt 182.0 lb

## 2016-01-17 DIAGNOSIS — R079 Chest pain, unspecified: Secondary | ICD-10-CM | POA: Diagnosis not present

## 2016-01-17 DIAGNOSIS — R6889 Other general symptoms and signs: Secondary | ICD-10-CM

## 2016-01-17 DIAGNOSIS — R0602 Shortness of breath: Secondary | ICD-10-CM

## 2016-01-17 LAB — COMPREHENSIVE METABOLIC PANEL
ALT: 14 U/L (ref 0–35)
AST: 12 U/L (ref 0–37)
Albumin: 3.9 g/dL (ref 3.5–5.2)
Alkaline Phosphatase: 61 U/L (ref 39–117)
BUN: 20 mg/dL (ref 6–23)
CHLORIDE: 106 meq/L (ref 96–112)
CO2: 27 meq/L (ref 19–32)
Calcium: 9.6 mg/dL (ref 8.4–10.5)
Creatinine, Ser: 0.89 mg/dL (ref 0.40–1.20)
GFR: 70.36 mL/min (ref >60.00)
GLUCOSE: 91 mg/dL (ref 70–99)
POTASSIUM: 3.6 meq/L (ref 3.5–5.1)
Sodium: 141 mEq/L (ref 135–145)
Total Bilirubin: 0.6 mg/dL (ref 0.2–1.2)
Total Protein: 7 g/dL (ref 6.0–8.3)

## 2016-01-17 LAB — CBC WITH DIFFERENTIAL/PLATELET
BASOS ABS: 0 10*3/uL (ref 0.0–0.1)
Basophils Relative: 0.2 % (ref 0.0–3.0)
Eosinophils Absolute: 0.1 10*3/uL (ref 0.0–0.7)
Eosinophils Relative: 1.3 % (ref 0.0–5.0)
HCT: 37.9 % (ref 36.0–46.0)
Hemoglobin: 13 g/dL (ref 12.0–15.0)
LYMPHS ABS: 1.3 10*3/uL (ref 0.7–4.0)
Lymphocytes Relative: 15.9 % (ref 12.0–46.0)
MCHC: 34.2 g/dL (ref 30.0–36.0)
MCV: 86.8 fl (ref 78.0–100.0)
MONO ABS: 0.5 10*3/uL (ref 0.1–1.0)
MONOS PCT: 5.4 % (ref 3.0–12.0)
NEUTROS PCT: 77.2 % — AB (ref 43.0–77.0)
Neutro Abs: 6.4 10*3/uL (ref 1.4–7.7)
Platelets: 167 10*3/uL (ref 150.0–400.0)
RBC: 4.36 Mil/uL (ref 3.87–5.11)
RDW: 13.3 % (ref 11.5–15.5)
WBC: 8.3 10*3/uL (ref 4.0–10.5)

## 2016-01-17 LAB — IBC PANEL
Iron: 40 ug/dL — ABNORMAL LOW (ref 42–145)
SATURATION RATIOS: 14 % — AB (ref 20.0–50.0)
TRANSFERRIN: 204 mg/dL — AB (ref 212.0–360.0)

## 2016-01-17 LAB — TSH: TSH: 3.62 u[IU]/mL (ref 0.35–4.50)

## 2016-01-17 MED ORDER — LEVOFLOXACIN 500 MG PO TABS: 500.0000 mg | ORAL_TABLET | Freq: Every day | ORAL | 0 refills | Status: DC

## 2016-01-17 MED ORDER — BUDESONIDE-FORMOTEROL FUMARATE 160-4.5 MCG/ACT IN AERO: INHALATION_SPRAY | RESPIRATORY_TRACT | 5 refills | Status: DC

## 2016-01-17 NOTE — Assessment & Plan Note (Signed)
Shortness of breath possibly related to reactive airway disease, however no significant wheezing upon exam. Obtain chest x-ray. Continue current medications as prescribed and follow up with pulmonology pending x-ray results.

## 2016-01-17 NOTE — Patient Instructions (Addendum)
Thank you for choosing [facility].  Summary/Instructions:  Continue to take your medications as prescribed.  Please stop by the lab on the lower level of the building for your blood work. Your results will be released to MyChart (or called to you) after review, usually within 72 hours after test completion. If any changes need to be made, you will be notified at that same time.  1. The lab is open from 7:30am to 5:30 pm Monday-Friday  2. No appointment is necessary  3. Fasting (if needed) is 6-8 hours after food and drink; black coffee and water  are okay   Please stop by radiology on the basement level of the building for your x-rays. Your results will be released to MyChart (or called to you) after review, usually within 72 hours after test completion. If any treatments or changes are necessary, you will be notified at that same time.  If your symptoms worsen or fail to improve, please contact our office for further instruction, or in case of emergency go directly to the emergency room at the closest medical facility.

## 2016-01-17 NOTE — Assessment & Plan Note (Signed)
Continues to experience feelings of cold and shakiness. Obtain TSH, IBC panel, comprehensive metabolic panel, CBC with differential, and magnesium levels to rule out possible metabolic causes. Continue to work with rheumatology and gynecology pending blood work.

## 2016-01-17 NOTE — Progress Notes (Signed)
Subjective:    Patient ID: Shannon Mckenzie, female    DOB: [birth year], 55 y.o.   MRN: [id number]  Chief Complaint  Patient presents with  . Back Pain    having issues with back pain and SOB, raspy voice, instances where waking up body aches and cold sweats    HPI:  Shannon Mckenzie is a 55 y.o. female who  has a past medical history of Allergy; Anxiety; Asthma; Depression; DJD (degenerative joint disease) of hip; Former smoker; GERD (gastroesophageal reflux disease); Hiatal hernia; Hyperlipidemia; Hypertension; Kidney stone; Reactive airway disease; RLS (restless legs syndrome); Routine gynecological examination; SOB (shortness of breath) (9/14); Statin intolerance; and Vision problems. and presents today for an acute office visit.   This is a chronic problem. Continues to experience associated symptom of feelings of cold, shaking, and new onset shortness of breath has been going on for several months. She was recently started on estrogen therapy for hot flashes which has not helped significantly. Timing of symptoms is generally at night. Described as feelings of cold working their way up her leg and feeling and extreme shaking. Expresses that she has to use her rescue inhaler more recently. She continues to use his Symbicort as prescribed. Previous TSH was 2.3. There are no modifying factors or additional treatments that make the symptoms better or worse. She is currently working with rheumatology. She is not experiencing any symptoms currently.   Allergies  Allergen Reactions  . Statins     Myalgias with all statins  . Amoxicillin Hives and Rash     Current Outpatient Prescriptions on File Prior to Visit  Medication Sig Dispense Refill  . esomeprazole (NEXIUM) 40 MG capsule Take 1 capsule (40 mg total) by mouth 2 (two) times daily. 180 capsule 3  . ezetimibe (ZETIA) 10 MG tablet Take 1 tablet (10 mg total) by mouth daily. 90 tablet 3  . ferrous fumarate (HEMOCYTE - 106 MG FE) 325 (106 FE)  MG TABS tablet Take 1 tablet by mouth.    . fluticasone (FLONASE) 50 MCG/ACT nasal spray Place 2 sprays into both nostrils daily. 16 g 1  . losartan (COZAAR) 50 MG tablet Take 1 tablet (50 mg total) by mouth daily. 90 tablet 3  . montelukast (SINGULAIR) 10 MG tablet Take 1 tablet (10 mg total) by mouth at bedtime. 30 tablet 2  . predniSONE (DELTASONE) 10 MG tablet 6 tab po day 1, 5 tab po day 2, 4 tab po day 3, 3 tab po day 4, 2 tab po day day 5, and 1 tab po day 6. 21 tablet 0  . sertraline (ZOLOFT) 100 MG tablet take 1 and 1/2 tablets by mouth at bedtime 135 tablet 1  . albuterol (VENTOLIN HFA) 108 (90 BASE) MCG/ACT inhaler Inhale 2 puffs into the lungs every 6 (six) hours as needed for wheezing. 1 Inhaler 1   No current facility-administered medications on file prior to visit.     Review of Systems  Constitutional: Positive for chills. Negative for fever.  Respiratory: Positive for chest tightness and shortness of breath. Negative for wheezing.   Cardiovascular: Negative for chest pain, palpitations and leg swelling.  Endocrine: Negative for polydipsia, polyphagia and polyuria.  Neurological: Negative for weakness and numbness.      Objective:    BP 132/80 (BP Location: Right Arm, Patient Position: Sitting, Cuff Size: Normal)   Pulse 81   Temp 97.9 F (36.6 C) (Oral)   Resp 16   Ht 5' 2" (1.575 m)  Wt 182 lb (82.6 kg)   LMP 03/13/2015   SpO2 95%   BMI 33.29 kg/m  Nursing note and vital signs reviewed.  Physical Exam  Constitutional: She is oriented to person, place, and time. She appears well-developed and well-nourished. No distress.  Cardiovascular: Normal rate, regular rhythm, normal heart sounds and intact distal pulses.  Exam reveals no gallop and no friction rub.   No murmur heard. Pulmonary/Chest: Effort normal and breath sounds normal. No respiratory distress. She has no wheezes. She has no rales. She exhibits no tenderness.  Neurological: She is alert and oriented  to person, place, and time.  Skin: Skin is warm and dry. She is not diaphoretic.  Psychiatric: She has a normal mood and affect. Her behavior is normal. Judgment and thought content normal.       Assessment & Plan:   Problem List Items Addressed This Visit      Other   Cold feeling - Primary    Continues to experience feelings of cold and shakiness. Obtain TSH, IBC panel, comprehensive metabolic panel, CBC with differential, and magnesium levels to rule out possible metabolic causes. Continue to work with rheumatology and gynecology pending blood work.       Relevant Medications   budesonide-formoterol (SYMBICORT) 160-4.5 MCG/ACT inhaler   Other Relevant Orders   CBC w/Diff   TSH   IBC panel   Magnesium   Comp Met (CMET)   Shortness of breath    Shortness of breath possibly related to reactive airway disease, however no significant wheezing upon exam. Obtain chest x-ray. Continue current medications as prescribed and follow up with pulmonology pending x-ray results.       Relevant Orders   DG Chest 2 View (Completed)   Comp Met (CMET)    Other Visit Diagnoses   None.      I have discontinued Ms. Kines's multivitamin with minerals, Calcium Carb-Cholecalciferol (CALCIUM + D3 PO), rOPINIRole, ibuprofen, glucosamine-chondroitin, traMADol, cefdinir, and benzonatate. I have also changed her SYMBICORT to budesonide-formoterol. Additionally, I am having her maintain her ferrous fumarate, esomeprazole, albuterol, losartan, ezetimibe, fluticasone, predniSONE, sertraline, and montelukast.   Meds ordered this encounter  Medications  . budesonide-formoterol (SYMBICORT) 160-4.5 MCG/ACT inhaler    Sig: INHALE 2 PUFFS INTO THE LUNGS TWICE A DAY    Dispense:  10.2 Inhaler    Refill:  5    Order Specific Question:   Supervising Provider    Answer:   Pricilla Holm A [[id number]]     Follow-up: Return in about 1 month (around 02/17/2016), or if symptoms worsen or fail to  improve.  Mauricio Po, FNP

## 2016-02-20 ENCOUNTER — Ambulatory Visit (INDEPENDENT_AMBULATORY_CARE_PROVIDER_SITE_OTHER): Payer: BLUE CROSS/BLUE SHIELD | Admitting: Family

## 2016-02-20 ENCOUNTER — Encounter: Payer: Self-pay | Admitting: Family

## 2016-02-20 ENCOUNTER — Other Ambulatory Visit (INDEPENDENT_AMBULATORY_CARE_PROVIDER_SITE_OTHER): Payer: BLUE CROSS/BLUE SHIELD

## 2016-02-20 VITALS — BP 128/80 | HR 71 | Temp 97.9°F | Resp 16 | Ht 62.0 in | Wt 185.0 lb

## 2016-02-20 DIAGNOSIS — R0602 Shortness of breath: Secondary | ICD-10-CM | POA: Diagnosis not present

## 2016-02-20 DIAGNOSIS — E611 Iron deficiency: Secondary | ICD-10-CM | POA: Diagnosis not present

## 2016-02-20 LAB — CBC
HEMATOCRIT: 39 % (ref 36.0–46.0)
HEMOGLOBIN: 13.6 g/dL (ref 12.0–15.0)
MCHC: 35 g/dL (ref 30.0–36.0)
MCV: 87.4 fl (ref 78.0–100.0)
Platelets: 165 10*3/uL (ref 150.0–400.0)
RBC: 4.46 Mil/uL (ref 3.87–5.11)
RDW: 13.9 % (ref 11.5–15.5)
WBC: 5.8 10*3/uL (ref 4.0–10.5)

## 2016-02-20 LAB — IBC PANEL
Iron: 117 ug/dL (ref 42–145)
Saturation Ratios: 38 % (ref 20.0–50.0)
Transferrin: 220 mg/dL (ref 212.0–360.0)

## 2016-02-20 NOTE — Progress Notes (Signed)
Subjective:    Patient ID: Shannon Mckenzie, female    DOB: 03-Jun-1961, 55 y.o.   MRN: [id number]  Chief Complaint  Patient presents with  . Follow-up    has been on prednisone and tapering off of it, starting to get the body aches again, lab work for iron, another xray of lungs?    HPI:  Shannon Mckenzie is a 55 y.o. female who  has a past medical history of Allergy; Anxiety; Asthma; Depression; DJD (degenerative joint disease) of hip; Former smoker; GERD (gastroesophageal reflux disease); Hiatal hernia; Hyperlipidemia; Hypertension; Kidney stone; Reactive airway disease; RLS (restless legs syndrome); Routine gynecological examination; SOB (shortness of breath) (9/14); Statin intolerance; and Vision problems. and presents today for a follow up office visit.   1.) Iron deficiency - Previously noted to have iron deficiency on most recent blood work with no anemia. Currently maintained on ferrous sulfate. Takes the medication as prescribed and denies adverse side effects. Nails have improved.   2.) Pneumonia - Previously noted to have shortness of breath and a chest x-ray showed pneumonia which was treated with levofloxacin. Reports taking the medication as prescribed and denies adverse side effects. No fevers. Shortness of breath and back pain have improved since.    Allergies  Allergen Reactions  . Statins     Myalgias with all statins  . Amoxicillin Hives and Rash      Outpatient Medications Prior to Visit  Medication Sig Dispense Refill  . albuterol (VENTOLIN HFA) 108 (90 BASE) MCG/ACT inhaler Inhale 2 puffs into the lungs every 6 (six) hours as needed for wheezing. 1 Inhaler 1  . budesonide-formoterol (SYMBICORT) 160-4.5 MCG/ACT inhaler INHALE 2 PUFFS INTO THE LUNGS TWICE A DAY 10.2 Inhaler 5  . esomeprazole (NEXIUM) 40 MG capsule Take 1 capsule (40 mg total) by mouth 2 (two) times daily. 180 capsule 3  . ezetimibe (ZETIA) 10 MG tablet Take 1 tablet (10 mg total) by mouth daily. 90  tablet 3  . ferrous fumarate (HEMOCYTE - 106 MG FE) 325 (106 FE) MG TABS tablet Take 1 tablet by mouth.    . fluticasone (FLONASE) 50 MCG/ACT nasal spray Place 2 sprays into both nostrils daily. 16 g 1  . levofloxacin (LEVAQUIN) 500 MG tablet Take 1 tablet (500 mg total) by mouth daily. 7 tablet 0  . losartan (COZAAR) 50 MG tablet Take 1 tablet (50 mg total) by mouth daily. 90 tablet 3  . montelukast (SINGULAIR) 10 MG tablet Take 1 tablet (10 mg total) by mouth at bedtime. 30 tablet 2  . predniSONE (DELTASONE) 10 MG tablet 6 tab po day 1, 5 tab po day 2, 4 tab po day 3, 3 tab po day 4, 2 tab po day day 5, and 1 tab po day 6. 21 tablet 0  . sertraline (ZOLOFT) 100 MG tablet take 1 and 1/2 tablets by mouth at bedtime 135 tablet 1   No facility-administered medications prior to visit.      Review of Systems  Constitutional: Negative for chills and fever.  Respiratory: Negative for cough, chest tightness and shortness of breath.   Cardiovascular: Negative for chest pain, palpitations and leg swelling.      Objective:    BP 128/80 (BP Location: Left Arm, Patient Position: Sitting, Cuff Size: Normal)   Pulse 71   Temp 97.9 F (36.6 C) (Oral)   Resp 16   Ht 5\' 2"  (1.575 m)   Wt 185 lb (83.9 kg)   SpO2 97%  BMI 33.84 kg/m  Nursing note and vital signs reviewed.  Physical Exam  Constitutional: She is oriented to person, place, and time. She appears well-developed and well-nourished. No distress.  Cardiovascular: Normal rate, regular rhythm, normal heart sounds and intact distal pulses.   Pulmonary/Chest: Effort normal and breath sounds normal. She has no wheezes. She has no rales. She exhibits no tenderness.  Neurological: She is alert and oriented to person, place, and time.  Skin: Skin is warm and dry. No pallor.  Psychiatric: She has a normal mood and affect. Her behavior is normal. Judgment and thought content normal.       Assessment & Plan:   Problem List Items Addressed  This Visit      Other   Shortness of breath    Previous x-rays positive for pneumonia and treated with levofloxacin. Shortness of breath has resolved as well as posterior back pain. No further treatment is necessary at this time. Follow-up if symptoms return.      Iron deficiency - Primary    Noted to have iron deficiency and started on ferrous sulfate. Reports improvement of symptoms since starting iron. No constipation or other adverse side effects. Obtain CBC and IBC panel. Continue current dosage of ferrous sulfate pending blood work results.      Relevant Orders   CBC   IBC panel    Other Visit Diagnoses   None.      I am having Ms. Purtle maintain her ferrous fumarate, esomeprazole, albuterol, losartan, ezetimibe, fluticasone, predniSONE, sertraline, montelukast, budesonide-formoterol, and levofloxacin.   Follow-up: Return in about 6 months (around 08/21/2016), or if symptoms worsen or fail to improve.  Jeanine Luz, FNP

## 2016-02-20 NOTE — Assessment & Plan Note (Signed)
Previous x-rays positive for pneumonia and treated with levofloxacin. Shortness of breath has resolved as well as posterior back pain. No further treatment is necessary at this time. Follow-up if symptoms return.

## 2016-02-20 NOTE — Patient Instructions (Addendum)
Thank you for choosing [facility].  SUMMARY AND INSTRUCTIONS:  Continue to follow up with Dr. Kellie Simmeringruslow.   Medication:  Please continue to take the iron as prescribed.   Your prescription(s) have been submitted to your pharmacy or been printed and provided for you. Please take as directed and contact our office if you believe you are having problem(s) with the medication(s) or have any questions.  Labs:  Please stop by the lab on the lower level of the building for your blood work. Your results will be released to MyChart (or called to you) after review, usually within 72 hours after test completion. If any changes need to be made, you will be notified at that same time.  1.) The lab is open from 7:30am to 5:30 pm Monday-Friday 2.) No appointment is necessary 3.) Fasting (if needed) is 6-8 hours after food and drink; black coffee and water are okay   Follow up:  If your symptoms worsen or fail to improve, please contact our office for further instruction, or in case of emergency go directly to the emergency room at the closest medical facility.

## 2016-02-20 NOTE — Assessment & Plan Note (Signed)
Noted to have iron deficiency and started on ferrous sulfate. Reports improvement of symptoms since starting iron. No constipation or other adverse side effects. Obtain CBC and IBC panel. Continue current dosage of ferrous sulfate pending blood work results.

## 2016-02-21 ENCOUNTER — Encounter: Payer: Self-pay | Admitting: Family

## 2016-02-27 ENCOUNTER — Other Ambulatory Visit: Payer: Self-pay | Admitting: Medical

## 2016-03-01 ENCOUNTER — Other Ambulatory Visit: Payer: Self-pay | Admitting: Family

## 2016-03-06 DIAGNOSIS — N9089 Other specified noninflammatory disorders of vulva and perineum: Secondary | ICD-10-CM | POA: Diagnosis not present

## 2016-03-06 DIAGNOSIS — N951 Menopausal and female climacteric states: Secondary | ICD-10-CM | POA: Diagnosis not present

## 2016-03-06 DIAGNOSIS — H04123 Dry eye syndrome of bilateral lacrimal glands: Secondary | ICD-10-CM | POA: Diagnosis not present

## 2016-03-06 DIAGNOSIS — H40033 Anatomical narrow angle, bilateral: Secondary | ICD-10-CM | POA: Diagnosis not present

## 2016-03-11 DIAGNOSIS — G44219 Episodic tension-type headache, not intractable: Secondary | ICD-10-CM | POA: Diagnosis not present

## 2016-03-19 ENCOUNTER — Other Ambulatory Visit: Payer: Self-pay | Admitting: Internal Medicine

## 2016-03-19 DIAGNOSIS — H04123 Dry eye syndrome of bilateral lacrimal glands: Secondary | ICD-10-CM | POA: Diagnosis not present

## 2016-03-25 DIAGNOSIS — M79642 Pain in left hand: Secondary | ICD-10-CM | POA: Diagnosis not present

## 2016-03-25 DIAGNOSIS — M79641 Pain in right hand: Secondary | ICD-10-CM | POA: Diagnosis not present

## 2016-03-25 DIAGNOSIS — M79671 Pain in right foot: Secondary | ICD-10-CM | POA: Diagnosis not present

## 2016-03-25 DIAGNOSIS — M130 Polyarthritis, unspecified: Secondary | ICD-10-CM | POA: Diagnosis not present

## 2016-04-08 DIAGNOSIS — H2513 Age-related nuclear cataract, bilateral: Secondary | ICD-10-CM | POA: Diagnosis not present

## 2016-04-09 ENCOUNTER — Other Ambulatory Visit: Payer: Self-pay | Admitting: Family

## 2016-04-22 DIAGNOSIS — M0570 Rheumatoid arthritis with rheumatoid factor of unspecified site without organ or systems involvement: Secondary | ICD-10-CM | POA: Diagnosis not present

## 2016-04-22 DIAGNOSIS — M79642 Pain in left hand: Secondary | ICD-10-CM | POA: Diagnosis not present

## 2016-04-22 DIAGNOSIS — M79641 Pain in right hand: Secondary | ICD-10-CM | POA: Diagnosis not present

## 2016-05-07 ENCOUNTER — Ambulatory Visit (INDEPENDENT_AMBULATORY_CARE_PROVIDER_SITE_OTHER): Payer: BLUE CROSS/BLUE SHIELD | Admitting: Nurse Practitioner

## 2016-05-07 ENCOUNTER — Encounter: Payer: Self-pay | Admitting: Nurse Practitioner

## 2016-05-07 VITALS — BP 142/80 | HR 78 | Temp 97.6°F | Ht 62.0 in | Wt 185.0 lb

## 2016-05-07 DIAGNOSIS — L209 Atopic dermatitis, unspecified: Secondary | ICD-10-CM | POA: Diagnosis not present

## 2016-05-07 DIAGNOSIS — L299 Pruritus, unspecified: Secondary | ICD-10-CM

## 2016-05-07 LAB — GLUCOSE, POCT (MANUAL RESULT ENTRY): POC GLUCOSE: 81 mg/dL (ref 70–99)

## 2016-05-07 MED ORDER — HYDROCORTISONE 2.5 % EX CREA: TOPICAL_CREAM | Freq: Two times a day (BID) | CUTANEOUS | 0 refills | Status: DC

## 2016-05-07 MED ORDER — HYDROXYZINE HCL 25 MG PO TABS: 25.0000 mg | ORAL_TABLET | Freq: Three times a day (TID) | ORAL | 0 refills | Status: DC | PRN

## 2016-05-07 NOTE — Progress Notes (Signed)
Reviewed with patient in office. See office note

## 2016-05-07 NOTE — Progress Notes (Signed)
Pre visit review using our clinic review tool, if applicable. No additional management support is needed unless otherwise documented below in the visit note. 

## 2016-05-07 NOTE — Patient Instructions (Signed)
Pruritus Introduction Pruritus is an itching feeling. There are many different conditions and factors that can make your skin itchy. Dry skin is one of the most common causes of itching. Most cases of itching do not require medical attention. Itchy skin can turn into a rash. Follow these instructions at home: Watch your pruritus for any changes. Take these steps to help with your condition: Skin Care  Moisturize your skin as needed. A moisturizer that contains petroleum jelly is best for keeping moisture in your skin.  Take or apply medicines only as directed by your health care provider. This may include:  Corticosteroid cream.  Anti-itch lotions.  Oral anti-histamines.  Apply cool compresses to the affected areas.  Try taking a bath with:  Epsom salts. Follow the instructions on the packaging. You can get these at your local pharmacy or grocery store.  Baking soda. Pour a small amount into the bath as directed by your health care provider.  Colloidal oatmeal. Follow the instructions on the packaging. You can get this at your local pharmacy or grocery store.  Try applying baking soda paste to your skin. Stir water into baking soda until it reaches a paste-like consistency.  Do not scratch your skin.  Avoid hot showers or baths, which can make itching worse. A cold shower may help with itching as long as you use a moisturizer after.  Avoid scented soaps, detergents, and perfumes. Use gentle soaps, detergents, perfumes, and other cosmetic products. General instructions  Avoid wearing tight clothes.  Keep a journal to help track what causes your itch. Write down:  What you eat.  What cosmetic products you use.  What you drink.  What you wear. This includes jewelry.  Use a humidifier. This keeps the air moist, which helps to prevent dry skin. Contact a health care provider if:  The itching does not go away after several days.  You sweat at night.  You have weight  loss.  You are unusually thirsty.  You urinate more than normal.  You are more tired than normal.  You have abdominal pain.  Your skin tingles.  You feel weak.  Your skin or the whites of your eyes look yellow (jaundice).  Your skin feels numb. This information is not intended to replace advice given to you by your health care provider. Make sure you discuss any questions you have with your health care provider. Document Released: 02/20/2011 Document Revised: 11/16/2015 Document Reviewed: 06/06/2014  2017 [employer]vier  

## 2016-05-07 NOTE — Progress Notes (Signed)
Subjective:  Patient ID: Shannon Mckenzie, female    DOB: [birth year]  Age: 55 y.o. MRN: [id number]  CC: Rash (Pt stated itching all over body for 2 days)  Rash  This is a new problem. Episode onset: 2 days. The problem has been gradually worsening since onset. The rash is diffuse. The rash is characterized by itchiness. She was exposed to nothing. Associated symptoms include shortness of breath. Pertinent negatives include no anorexia, congestion, cough, facial edema, fatigue, fever, joint pain, nail changes, rhinorrhea or sore throat. Past treatments include moisturizer. The treatment provided mild relief. Her past medical history is significant for allergies. There is no history of asthma, eczema or varicella.    Outpatient Medications Prior to Visit  Medication Sig Dispense Refill  . albuterol (VENTOLIN HFA) 108 (90 BASE) MCG/ACT inhaler Inhale 2 puffs into the lungs every 6 (six) hours as needed for wheezing. 1 Inhaler 1  . budesonide-formoterol (SYMBICORT) 160-4.5 MCG/ACT inhaler INHALE 2 PUFFS INTO THE LUNGS TWICE A DAY 10.2 Inhaler 5  . esomeprazole (NEXIUM) 40 MG capsule Take 1 capsule (40 mg total) by mouth 2 (two) times daily. 180 capsule 3  . ezetimibe (ZETIA) 10 MG tablet Take 1 tablet (10 mg total) by mouth daily. 90 tablet 3  . ferrous fumarate (HEMOCYTE - 106 MG FE) 325 (106 FE) MG TABS tablet Take 1 tablet by mouth.    . fluticasone (FLONASE) 50 MCG/ACT nasal spray instill 2 sprays into each nostril once daily 16 g 1  . losartan (COZAAR) 50 MG tablet Take 1 tablet (50 mg total) by mouth daily. 90 tablet 3  . montelukast (SINGULAIR) 10 MG tablet take 1 tablet by mouth at bedtime 30 tablet 2  . predniSONE (DELTASONE) 10 MG tablet 6 tab po day 1, 5 tab po day 2, 4 tab po day 3, 3 tab po day 4, 2 tab po day day 5, and 1 tab po day 6. 21 tablet 0  . sertraline (ZOLOFT) 100 MG tablet take 1 and 1/2 tablets by mouth at bedtime 135 tablet 1  . levofloxacin (LEVAQUIN) 500 MG tablet Take 1  tablet (500 mg total) by mouth daily. 7 tablet 0   No facility-administered medications prior to visit.     ROS See HPI  Objective:  BP (!) 142/80 (BP Location: Left Arm, Patient Position: Sitting, Cuff Size: Normal)   Pulse 78   Temp 97.6 F (36.4 C)   Ht 5\' 2"  (1.575 m)   Wt 185 lb (83.9 kg)   SpO2 98%   BMI 33.84 kg/m   BP Readings from Last 3 Encounters:  05/07/16 (!) 142/80  02/20/16 128/80  01/17/16 132/80    Wt Readings from Last 3 Encounters:  05/07/16 185 lb (83.9 kg)  02/20/16 185 lb (83.9 kg)  01/17/16 182 lb (82.6 kg)    Physical Exam  Constitutional: She is oriented to person, place, and time. No distress.  Cardiovascular: Normal rate.   Pulmonary/Chest: Effort normal.  Musculoskeletal: She exhibits no edema.  Neurological: She is alert and oriented to person, place, and time.  Skin: Skin is warm and dry. No rash noted. No erythema.  No skin eruptions noted. Excoriation marks noted.  Vitals reviewed.   Lab Results  Component Value Date   WBC 5.8 02/20/2016   HGB 13.6 02/20/2016   HCT 39.0 02/20/2016   PLT 165.0 02/20/2016   GLUCOSE 91 01/17/2016   CHOL 216 (H) 07/06/2014   TRIG 60.0 07/06/2014   HDL 58.20  07/06/2014   LDLCALC 146 (H) 07/06/2014   ALT 14 01/17/2016   AST 12 01/17/2016   NA 141 01/17/2016   K 3.6 01/17/2016   CL 106 01/17/2016   CREATININE 0.89 01/17/2016   BUN 20 01/17/2016   CO2 27 01/17/2016   TSH 3.62 01/17/2016   HGBA1C 5.2 05/03/2014    Dg Chest 2 View  Result Date: 01/17/2016 CLINICAL DATA:  Shortness of breath for 2 days.  Mild chest pain. EXAM: CHEST  2 VIEW COMPARISON:  08/25/2015 FINDINGS: Airspace disease noted peripherally in the right lung base concerning for right middle lobe pneumonia. Left lung is clear. Heart is upper limits normal in size. No effusions or acute bony abnormality. IMPRESSION: Right basilar/middle lobe pneumonia. Electronically Signed   By: Charlett NoseKevin  Dover M.D.   On: 01/17/2016  13:15   Assessment & Plan:   Shannon Mckenzie was seen today for rash.  Diagnoses and all orders for this visit:  Pruritus -     hydrOXYzine (ATARAX/VISTARIL) 25 MG tablet; Take 1 tablet (25 mg total) by mouth 3 (three) times daily as needed for itching. -     hydrocortisone 2.5 % cream; Apply topically 2 (two) times daily. -     POCT Glucose (CBG)  Atopic dermatitis, unspecified type -     hydrOXYzine (ATARAX/VISTARIL) 25 MG tablet; Take 1 tablet (25 mg total) by mouth 3 (three) times daily as needed for itching. -     hydrocortisone 2.5 % cream; Apply topically 2 (two) times daily.   I have discontinued Ms. Luhmann's levofloxacin. I am also having her start on hydrOXYzine and hydrocortisone. Additionally, I am having her maintain her ferrous fumarate, esomeprazole, albuterol, losartan, ezetimibe, predniSONE, budesonide-formoterol, fluticasone, montelukast, sertraline, progesterone, estradiol, cycloSPORINE, and leflunomide.  Meds ordered this encounter  Medications  . hydrOXYzine (ATARAX/VISTARIL) 25 MG tablet    Sig: Take 1 tablet (25 mg total) by mouth 3 (three) times daily as needed for itching.    Dispense:  20 tablet    Refill:  0    Order Specific Question:   Supervising Provider    Answer:   Tresa GarterPLOTNIKOV, ALEKSEI V [[id number]]  . hydrocortisone 2.5 % cream    Sig: Apply topically 2 (two) times daily.    Dispense:  453.6 g    Refill:  0    Order Specific Question:   Supervising Provider    Answer:   Tresa GarterPLOTNIKOV, ALEKSEI V [[id number]]  . progesterone (PROMETRIUM) 200 MG capsule    Sig: Take 200 mg by mouth daily.  Marland Kitchen. estradiol (CLIMARA - DOSED IN MG/24 HR) 0.0375 mg/24hr patch    Sig: Place 0.0375 mg onto the skin once a week.  . cycloSPORINE (RESTASIS) 0.05 % ophthalmic emulsion    Sig: 1 drop 2 (two) times daily.  Marland Kitchen. leflunomide (ARAVA) 20 MG tablet    Sig: Take 20 mg by mouth daily.    Follow-up: Return if symptoms worsen or fail to improve.  Alysia Pennaharlotte Lynnann Knudsen, NP

## 2016-05-29 DIAGNOSIS — G44219 Episodic tension-type headache, not intractable: Secondary | ICD-10-CM | POA: Diagnosis not present

## 2016-06-15 ENCOUNTER — Other Ambulatory Visit: Payer: Self-pay | Admitting: Family

## 2016-06-25 DIAGNOSIS — M0570 Rheumatoid arthritis with rheumatoid factor of unspecified site without organ or systems involvement: Secondary | ICD-10-CM | POA: Diagnosis not present

## 2016-06-25 DIAGNOSIS — M7062 Trochanteric bursitis, left hip: Secondary | ICD-10-CM | POA: Diagnosis not present

## 2016-06-25 DIAGNOSIS — M79642 Pain in left hand: Secondary | ICD-10-CM | POA: Diagnosis not present

## 2016-06-25 DIAGNOSIS — M79641 Pain in right hand: Secondary | ICD-10-CM | POA: Diagnosis not present

## 2016-06-28 ENCOUNTER — Other Ambulatory Visit: Payer: Self-pay | Admitting: *Deleted

## 2016-06-28 MED ORDER — MONTELUKAST SODIUM 10 MG PO TABS: 10.0000 mg | ORAL_TABLET | Freq: Every day | ORAL | 2 refills | Status: DC

## 2016-06-28 NOTE — Telephone Encounter (Signed)
Rec'd call pt states she has change pharmacy now using [facility]/[address]. Needing to get refill sent on her singulair. Inform pt sending electronically as we speak...Raechel Chute/LMB

## 2016-07-05 ENCOUNTER — Other Ambulatory Visit (INDEPENDENT_AMBULATORY_CARE_PROVIDER_SITE_OTHER): Payer: BLUE CROSS/BLUE SHIELD

## 2016-07-05 ENCOUNTER — Encounter: Payer: Self-pay | Admitting: Family

## 2016-07-05 ENCOUNTER — Ambulatory Visit (INDEPENDENT_AMBULATORY_CARE_PROVIDER_SITE_OTHER): Payer: BLUE CROSS/BLUE SHIELD | Admitting: Family

## 2016-07-05 VITALS — BP 134/90 | HR 74 | Temp 97.7°F | Resp 18 | Ht 62.0 in | Wt 177.0 lb

## 2016-07-05 DIAGNOSIS — Z0000 Encounter for general adult medical examination without abnormal findings: Secondary | ICD-10-CM

## 2016-07-05 DIAGNOSIS — L0291 Cutaneous abscess, unspecified: Secondary | ICD-10-CM | POA: Insufficient documentation

## 2016-07-05 DIAGNOSIS — L02211 Cutaneous abscess of abdominal wall: Secondary | ICD-10-CM

## 2016-07-05 DIAGNOSIS — Z0001 Encounter for general adult medical examination with abnormal findings: Secondary | ICD-10-CM | POA: Diagnosis not present

## 2016-07-05 LAB — LIPID PANEL
CHOLESTEROL: 252 mg/dL — AB (ref 0–200)
HDL: 59.5 mg/dL (ref >39.00)
LDL Cholesterol: 182 mg/dL — ABNORMAL HIGH (ref 0–99)
NONHDL: 192.96
Total CHOL/HDL Ratio: 4
Triglycerides: 53 mg/dL (ref 0.0–149.0)
VLDL: 10.6 mg/dL (ref 0.0–40.0)

## 2016-07-05 LAB — COMPREHENSIVE METABOLIC PANEL
ALBUMIN: 4.1 g/dL (ref 3.5–5.2)
ALK PHOS: 79 U/L (ref 39–117)
ALT: 19 U/L (ref 0–35)
AST: 21 U/L (ref 0–37)
BILIRUBIN TOTAL: 0.6 mg/dL (ref 0.2–1.2)
BUN: 14 mg/dL (ref 6–23)
CO2: 28 mEq/L (ref 19–32)
CREATININE: 0.68 mg/dL (ref 0.40–1.20)
Calcium: 9.3 mg/dL (ref 8.4–10.5)
Chloride: 107 mEq/L (ref 96–112)
GFR: 95.81 mL/min (ref >60.00)
Glucose, Bld: 82 mg/dL (ref 70–99)
Potassium: 3.8 mEq/L (ref 3.5–5.1)
SODIUM: 142 meq/L (ref 135–145)
TOTAL PROTEIN: 6.8 g/dL (ref 6.0–8.3)

## 2016-07-05 LAB — CBC
HCT: 40.5 % (ref 36.0–46.0)
Hemoglobin: 14 g/dL (ref 12.0–15.0)
MCHC: 34.5 g/dL (ref 30.0–36.0)
MCV: 87.3 fl (ref 78.0–100.0)
Platelets: 174 10*3/uL (ref 150.0–400.0)
RBC: 4.65 Mil/uL (ref 3.87–5.11)
RDW: 13.2 % (ref 11.5–15.5)
WBC: 6.3 10*3/uL (ref 4.0–10.5)

## 2016-07-05 LAB — VITAMIN D 25 HYDROXY (VIT D DEFICIENCY, FRACTURES): VITD: 45.99 ng/mL (ref 30.00–100.00)

## 2016-07-05 MED ORDER — DOXYCYCLINE HYCLATE 100 MG PO TABS: 100.0000 mg | ORAL_TABLET | Freq: Two times a day (BID) | ORAL | 0 refills | Status: DC

## 2016-07-05 NOTE — Progress Notes (Signed)
Subjective:    Patient ID: Shannon Mckenzie, female    DOB: 09-01-1960, 56 y.o.   MRN: [id number]  Chief Complaint  Patient presents with  . CPE    fasting    HPI:  Shannon Mckenzie is a 56 y.o. female who presents today for an annual wellness visit.   1) Health Maintenance -   Diet - Averaging about 2 meals per day consisting of a regular diet; Caffeine intake is on occasion.   Exercise - No structured exercise.    2) Preventative Exams / Immunizations:  Dental -- Up to date  Vision --Up to date   Health Maintenance  Topic Date Due  . Hepatitis C Screening  1961-06-15  . HIV Screening  06/13/1977  . MAMMOGRAM  12/13/2017  . PAP SMEAR  12/14/2018  . COLONOSCOPY  07/24/2022  . TETANUS/TDAP  07/06/2024  . INFLUENZA VACCINE  Completed    Immunization History  Administered Date(s) Administered  . Influenza Split 07/06/2011, 03/20/2012  . Influenza Whole 04/02/2001, 04/23/2002  . Influenza,inj,Quad PF,36+ Mos 03/19/2013, 02/23/2014  . Pneumococcal Polysaccharide-23 03/20/2012  . Td 02/16/2005  . Tdap 07/06/2014    3.) Lesion - This is a new problem. Associated symptom of a lesion located on the left side of her chest has been going on for about 3 days. Modifying factors include a warm compress which has not helped very much. Denies fevers. Indicates that it continues to worsen since initial onset. There is pain associated with it and no discharge.    Allergies  Allergen Reactions  . Statins     Myalgias with all statins  . Amoxicillin Hives and Rash     Outpatient Medications Prior to Visit  Medication Sig Dispense Refill  . albuterol (VENTOLIN HFA) 108 (90 BASE) MCG/ACT inhaler Inhale 2 puffs into the lungs every 6 (six) hours as needed for wheezing. 1 Inhaler 1  . budesonide-formoterol (SYMBICORT) 160-4.5 MCG/ACT inhaler INHALE 2 PUFFS INTO THE LUNGS TWICE A DAY 10.2 Inhaler 5  . cycloSPORINE (RESTASIS) 0.05 % ophthalmic emulsion 1 drop 2 (two) times daily.      Marland Kitchen esomeprazole (NEXIUM) 40 MG capsule Take 1 capsule (40 mg total) by mouth 2 (two) times daily. 180 capsule 3  . estradiol (CLIMARA - DOSED IN MG/24 HR) 0.0375 mg/24hr patch Place 0.0375 mg onto the skin once a week.    . ezetimibe (ZETIA) 10 MG tablet Take 1 tablet (10 mg total) by mouth daily. 90 tablet 3  . ferrous fumarate (HEMOCYTE - 106 MG FE) 325 (106 FE) MG TABS tablet Take 1 tablet by mouth.    . fluticasone (FLONASE) 50 MCG/ACT nasal spray instill 2 sprays into each nostril once daily 16 g 1  . hydrocortisone 2.5 % cream Apply topically 2 (two) times daily. 453.6 g 0  . hydrOXYzine (ATARAX/VISTARIL) 25 MG tablet Take 1 tablet (25 mg total) by mouth 3 (three) times daily as needed for itching. 20 tablet 0  . leflunomide (ARAVA) 20 MG tablet Take 20 mg by mouth daily.    Marland Kitchen losartan (COZAAR) 50 MG tablet Take 1 tablet (50 mg total) by mouth daily. 90 tablet 3  . montelukast (SINGULAIR) 10 MG tablet Take 1 tablet (10 mg total) by mouth at bedtime. 30 tablet 2  . predniSONE (DELTASONE) 10 MG tablet 6 tab po day 1, 5 tab po day 2, 4 tab po day 3, 3 tab po day 4, 2 tab po day day 5, and 1 tab po day  6. 21 tablet 0  . progesterone (PROMETRIUM) 200 MG capsule Take 200 mg by mouth daily.    . sertraline (ZOLOFT) 100 MG tablet take 1 and 1/2 tablets by mouth at bedtime 135 tablet 1   No facility-administered medications prior to visit.      Past Medical History:  Diagnosis Date  . Allergy   . Anxiety   . Asthma   . Depression   . DJD (degenerative joint disease) of hip   . Former smoker   . GERD (gastroesophageal reflux disease)   . Hiatal hernia   . Hyperlipidemia   . Hypertension   . Kidney stone   . Reactive airway disease   . RLS (restless legs syndrome)   . Routine gynecological examination    sees gynecology, [name] NP  . SOB (shortness of breath) 9/14   cardiac eval with stress test and echo; Dr. Viann FishSpencer Tilley  . Statin intolerance   . Vision problems    wears  contact lenses     Past Surgical History:  Procedure Laterality Date  . COLONOSCOPY  06/2012   Dr. Ramon DredgeEdward; repeat 10 years  . UPPER GI ENDOSCOPY N/A 07/24/2012   [place] Hernia,normal stomach, normal examined duodenum     Family History  Problem Relation Age of Onset  . Arthritis Mother   . Asthma Mother   . Diabetes Mother   . Cirrhosis Mother     died of liver nonalcoholic cirrhosis  . COPD Mother   . Heart disease Mother     CHF  . Asthma Sister   . Cancer Father     died of pancreatic cancer  . Depression Brother   . Stroke Maternal Aunt     died age 55yo of CVA  . Other Brother     died of MVA     Social History   Social History  . Marital status: Married    Spouse name: N/A  . Number of children: 1  . Years of education: 9   Occupational History  . Housekeeping Unemployed   Social History Main Topics  . Smoking status: Former Smoker    Packs/day: 1.00    Years: 15.00    Types: Cigarettes    Quit date: 06/25/1991  . Smokeless tobacco: Never Used  . Alcohol use No  . Drug use: No  . Sexual activity: Not Currently   Other Topics Concern  . Not on file   Social History Narrative   Married, 1 son, no exercise other than on the job, [employer]   Fun: Watch movies, be outside and work in the yard, photography   Denies any religious beliefs that would effect health care.       Review of Systems  Constitutional: Denies fever, chills, fatigue, or significant weight gain/loss. HENT: Head: Denies headache or neck pain Ears: Denies changes in hearing, ringing in ears, earache, drainage Nose: Denies discharge, stuffiness, itching, nosebleed, sinus pain Throat: Denies sore throat, hoarseness, dry mouth, sores, thrush Eyes: Denies loss/changes in vision, pain, redness, blurry/double vision, flashing lights Cardiovascular: Denies chest pain/discomfort, tightness, palpitations, shortness of breath with activity, difficulty lying down, swelling, sudden  awakening with shortness of breath Respiratory: Denies shortness of breath, cough, sputum production, wheezing Gastrointestinal: Denies dysphasia, heartburn, change in appetite, nausea, change in bowel habits, rectal bleeding, constipation, diarrhea, yellow skin or eyes Genitourinary: Denies frequency, urgency, burning/pain, blood in urine, incontinence, change in urinary strength. Musculoskeletal: Denies muscle/joint pain, stiffness, back pain, redness or swelling of  joints, trauma Skin: Denies rashes, lumps, itching, dryness, color changes, or hair/nail changes Neurological: Denies dizziness, fainting, seizures, weakness, numbness, tingling, tremor Psychiatric - Denies nervousness, stress, depression or memory loss Endocrine: Denies heat or cold intolerance, sweating, frequent urination, excessive thirst, changes in appetite Hematologic: Denies ease of bruising or bleeding     Objective:     BP 134/90 (BP Location: Left Arm, Patient Position: Sitting, Cuff Size: Large)   Pulse 74   Temp 97.7 F (36.5 C) (Oral)   Resp 18   Ht 5\' 2"  (1.575 m)   Wt 177 lb (80.3 kg)   SpO2 94%   BMI 32.37 kg/m  Nursing note and vital signs reviewed.  Physical Exam  Constitutional: She is oriented to person, place, and time. She appears well-developed and well-nourished.  HENT:  Head: Normocephalic.  Right Ear: Hearing, tympanic membrane, external ear and ear canal normal.  Left Ear: Hearing, tympanic membrane, external ear and ear canal normal.  Nose: Nose normal.  Mouth/Throat: Uvula is midline, oropharynx is clear and moist and mucous membranes are normal.  Eyes: Conjunctivae and EOM are normal. Pupils are equal, round, and reactive to light.  Neck: Neck supple. No JVD present. No tracheal deviation present. No thyromegaly present.  Cardiovascular: Normal rate, regular rhythm, normal heart sounds and intact distal pulses.   Pulmonary/Chest: Effort normal and breath sounds normal.  Abdominal:  Soft. Bowel sounds are normal. She exhibits no distension and no mass. There is no tenderness. There is no rebound and no guarding.  Musculoskeletal: Normal range of motion. She exhibits no edema or tenderness.  Lymphadenopathy:    She has no cervical adenopathy.  Neurological: She is alert and oriented to person, place, and time. She has normal reflexes. No cranial nerve deficit. She exhibits normal muscle tone. Coordination normal.  Skin: Skin is warm and dry.     Psychiatric: She has a normal mood and affect. Her behavior is normal. Judgment and thought content normal.       Assessment & Plan:   Problem List Items Addressed This Visit      Musculoskeletal and Integument   Abscess of skin    Symptoms and exam consistent with a skin abscess located on the left aspect of the chest wall / abdomen. Discussed possible incision and drainage, however patient would like to trial antibiotics. Start doxycyline. If symptoms worsen or do not improve follow up for incision and drainage.       Relevant Medications   doxycycline (VIBRA-TABS) 100 MG tablet     Other   Encounter for general adult medical examination with abnormal findings - Primary    1) Anticipatory Guidance: Discussed importance of wearing a seatbelt while driving and not texting while driving; changing batteries in smoke detector at least once annually; wearing suntan lotion when outside; eating a balanced and moderate diet; getting physical activity at least 30 minutes per day.  2) Immunizations / Screenings / Labs:  All immunizations are up-to-date per recommendations. Obtain hepatitis C antibody for hepatitis C screening. Obtain Vitamin D for Vitamin D deficiency. All other screenings are up-to-date per recommendations. Obtain CBC, CMET, and lipid profile.     Overall well exam with risk factors for cardiovascular disease including hypertension, hyperlipidemia and obesity. Recommend weight loss of 5-10% of current body weight  through nutrition and physical activity changes. Chronic conditions appear adequately managed with the current medication. She continues to work on her controlling her rheumatoid arthritis. Continue other healthy lifestyle behaviors and  choices. Follow up prevention exam in 1 year. Follow up office visit for chronic conditions pending blood work.       Relevant Orders   Hepatitis C antibody   CBC (Completed)   Comprehensive metabolic panel (Completed)   Lipid panel (Completed)   VITAMIN D 25 Hydroxy (Vit-D Deficiency, Fractures) (Completed)       I am having Ms. Charlot maintain her ferrous fumarate, esomeprazole, albuterol, losartan, ezetimibe, predniSONE, budesonide-formoterol, sertraline, hydrOXYzine, hydrocortisone, progesterone, estradiol, cycloSPORINE, leflunomide, fluticasone, montelukast, TURMERIC CURCUMIN PO, multivitamin, Cholecalciferol, omega-3 acid ethyl esters, Cinnamon, hydroxychloroquine, and doxycycline.   Meds ordered this encounter  Medications  . TURMERIC CURCUMIN PO    Sig: Take by mouth.  . Multiple Vitamin (MULTIVITAMIN) tablet    Sig: Take 1 tablet by mouth daily.  . Cholecalciferol (D3 HIGH POTENCY) 2000 units CAPS    Sig: Take by mouth.  . omega-3 acid ethyl esters (LOVAZA) 1 g capsule    Sig: Take by mouth 2 (two) times daily.  . Cinnamon 500 MG TABS    Sig: Take by mouth.  . hydroxychloroquine (PLAQUENIL) 200 MG tablet    Sig: Take 200 mg by mouth daily.  Marland Kitchen DISCONTD: doxycycline (VIBRA-TABS) 100 MG tablet    Sig: Take 1 tablet (100 mg total) by mouth 2 (two) times daily.    Dispense:  20 tablet    Refill:  0    Order Specific Question:   Supervising Provider    Answer:   Hillard Danker A [[id number]]  . doxycycline (VIBRA-TABS) 100 MG tablet    Sig: Take 1 tablet (100 mg total) by mouth 2 (two) times daily.    Dispense:  20 tablet    Refill:  0    Order Specific Question:   Supervising Provider    Answer:   Hillard Danker A [[id number]]      Follow-up: Return if symptoms worsen or fail to improve.   Jeanine Luz, FNP

## 2016-07-05 NOTE — Assessment & Plan Note (Addendum)
1) Anticipatory Guidance: Discussed importance of wearing a seatbelt while driving and not texting while driving; changing batteries in smoke detector at least once annually; wearing suntan lotion when outside; eating a balanced and moderate diet; getting physical activity at least 30 minutes per day.  2) Immunizations / Screenings / Labs:  All immunizations are up-to-date per recommendations. Obtain hepatitis C antibody for hepatitis C screening. Obtain Vitamin D for Vitamin D deficiency. All other screenings are up-to-date per recommendations. Obtain CBC, CMET, and lipid profile.     Overall well exam with risk factors for cardiovascular disease including hypertension, hyperlipidemia and obesity. Recommend weight loss of 5-10% of current body weight through nutrition and physical activity changes. Chronic conditions appear adequately managed with the current medication. She continues to work on her controlling her rheumatoid arthritis. Continue other healthy lifestyle behaviors and choices. Follow up prevention exam in 1 year. Follow up office visit for chronic conditions pending blood work.

## 2016-07-05 NOTE — Assessment & Plan Note (Signed)
Symptoms and exam consistent with a skin abscess located on the left aspect of the chest wall / abdomen. Discussed possible incision and drainage, however patient would like to trial antibiotics. Start doxycyline. If symptoms worsen or do not improve follow up for incision and drainage.

## 2016-07-05 NOTE — Patient Instructions (Addendum)
Thank you for choosing [facility].  SUMMARY AND INSTRUCTIONS:  Start the doxycycline. If no improvement please follow up for further treatment.   Medication:  Please continue to take your medications as prescribed.   Your prescription(s) have been submitted to your pharmacy or been printed and provided for you. Please take as directed and contact our office if you believe you are having problem(s) with the medication(s) or have any questions.  Labs:  Please stop by the lab on the lower level of the building for your blood work. Your results will be released to [place] (or called to you) after review, usually within 72 hours after test completion. If any changes need to be made, you will be notified at that same time.  1.) The lab is open from 7:30am to 5:30 pm Monday-Friday 2.) No appointment is necessary 3.) Fasting (if needed) is 6-8 hours after food and drink; black coffee and water are okay   Follow up:  If your symptoms worsen or fail to improve, please contact our office for further instruction, or in case of emergency go directly to the emergency room at the closest medical facility.   Health Maintenance, Female Introduction Adopting a healthy lifestyle and getting preventive care can go a long way to promote health and wellness. Talk with your health care provider about what schedule of regular examinations is right for you. This is a good chance for you to check in with your provider about disease prevention and staying healthy. In between checkups, there are plenty of things you can do on your own. Experts have done a lot of research about which lifestyle changes and preventive measures are most likely to keep you healthy. Ask your health care provider for more information. Weight and diet Eat a healthy diet  Be sure to include plenty of vegetables, fruits, low-fat dairy products, and lean protein.  Do not eat a lot of foods high in solid fats, added sugars, or  salt.  Get regular exercise. This is one of the most important things you can do for your health.  Most adults should exercise for at least 150 minutes each week. The exercise should increase your heart rate and make you sweat (moderate-intensity exercise).  Most adults should also do strengthening exercises at least twice a week. This is in addition to the moderate-intensity exercise. Maintain a healthy weight  Body mass index (BMI) is a measurement that can be used to identify possible weight problems. It estimates body fat based on height and weight. Your health care provider can help determine your BMI and help you achieve or maintain a healthy weight.  For females 8 years of age and older:  A BMI below 18.5 is considered underweight.  A BMI of 18.5 to 24.9 is normal.  A BMI of 25 to 29.9 is considered overweight.  A BMI of 30 and above is considered obese. Watch levels of cholesterol and blood lipids  You should start having your blood tested for lipids and cholesterol at 55 years of age, then have this test every 5 years.  You may need to have your cholesterol levels checked more often if:  Your lipid or cholesterol levels are high.  You are older than 55 years of age.  You are at high risk for heart disease. Cancer screening Lung Cancer  Lung cancer screening is recommended for adults 6-73 years old who are at high risk for lung cancer because of a history of smoking.  A yearly low-dose  CT scan of the lungs is recommended for people who:  Currently smoke.  Have quit within the past 15 years.  Have at least a 30-pack-year history of smoking. A pack year is smoking an average of one pack of cigarettes a day for 1 year.  Yearly screening should continue until it has been 15 years since you quit.  Yearly screening should stop if you develop a health problem that would prevent you from having lung cancer treatment. Breast Cancer  Practice breast self-awareness.  This means understanding how your breasts normally appear and feel.  It also means doing regular breast self-exams. Let your health care provider know about any changes, no matter how small.  If you are in your 20s or 30s, you should have a clinical breast exam (CBE) by a health care provider every 1-3 years as part of a regular health exam.  If you are 67 or older, have a CBE every year. Also consider having a breast X-ray (mammogram) every year.  If you have a family history of breast cancer, talk to your health care provider about genetic screening.  If you are at high risk for breast cancer, talk to your health care provider about having an MRI and a mammogram every year.  Breast cancer gene (BRCA) assessment is recommended for women who have family members with BRCA-related cancers. BRCA-related cancers include:  Breast.  Ovarian.  Tubal.  Peritoneal cancers.  Results of the assessment will determine the need for genetic counseling and BRCA1 and BRCA2 testing. Cervical Cancer  Your health care provider may recommend that you be screened regularly for cancer of the pelvic organs (ovaries, uterus, and vagina). This screening involves a pelvic examination, including checking for microscopic changes to the surface of your cervix (Pap test). You may be encouraged to have this screening done every 3 years, beginning at age 44.  For women ages 26-65, health care providers may recommend pelvic exams and Pap testing every 3 years, or they may recommend the Pap and pelvic exam, combined with testing for human papilloma virus (HPV), every 5 years. Some types of HPV increase your risk of cervical cancer. Testing for HPV may also be done on women of any age with unclear Pap test results.  Other health care providers may not recommend any screening for nonpregnant women who are considered low risk for pelvic cancer and who do not have symptoms. Ask your health care provider if a screening pelvic  exam is right for you.  If you have had past treatment for cervical cancer or a condition that could lead to cancer, you need Pap tests and screening for cancer for at least 20 years after your treatment. If Pap tests have been discontinued, your risk factors (such as having a new sexual partner) need to be reassessed to determine if screening should resume. Some women have medical problems that increase the chance of getting cervical cancer. In these cases, your health care provider may recommend more frequent screening and Pap tests. Colorectal Cancer  This type of cancer can be detected and often prevented.  Routine colorectal cancer screening usually begins at 55 years of age and continues through 55 years of age.  Your health care provider may recommend screening at an earlier age if you have risk factors for colon cancer.  Your health care provider may also recommend using home test kits to check for hidden blood in the stool.  A small camera at the end of a tube can be  used to examine your colon directly (sigmoidoscopy or colonoscopy). This is done to check for the earliest forms of colorectal cancer.  Routine screening usually begins at age 58.  Direct examination of the colon should be repeated every 5-10 years through 55 years of age. However, you may need to be screened more often if early forms of precancerous polyps or small growths are found. Skin Cancer  Check your skin from head to toe regularly.  Tell your health care provider about any new moles or changes in moles, especially if there is a change in a mole's shape or color.  Also tell your health care provider if you have a mole that is larger than the size of a pencil eraser.  Always use sunscreen. Apply sunscreen liberally and repeatedly throughout the day.  Protect yourself by wearing long sleeves, pants, a wide-brimmed hat, and sunglasses whenever you are outside. Heart disease, diabetes, and high blood  pressure  High blood pressure causes heart disease and increases the risk of stroke. High blood pressure is more likely to develop in:  People who have blood pressure in the high end of the normal range (130-139/85-89 mm Hg).  People who are overweight or obese.  People who are African American.  If you are 65-27 years of age, have your blood pressure checked every 3-5 years. If you are 60 years of age or older, have your blood pressure checked every year. You should have your blood pressure measured twice-once when you are at a hospital or clinic, and once when you are not at a hospital or clinic. Record the average of the two measurements. To check your blood pressure when you are not at a hospital or clinic, you can use:  An automated blood pressure machine at a pharmacy.  A home blood pressure monitor.  If you are between 43 years and 39 years old, ask your health care provider if you should take aspirin to prevent strokes.  Have regular diabetes screenings. This involves taking a blood sample to check your fasting blood sugar level.  If you are at a normal weight and have a low risk for diabetes, have this test once every three years after 55 years of age.  If you are overweight and have a high risk for diabetes, consider being tested at a younger age or more often. Preventing infection Hepatitis B  If you have a higher risk for hepatitis B, you should be screened for this virus. You are considered at high risk for hepatitis B if:  You were born in a country where hepatitis B is common. Ask your health care provider which countries are considered high risk.  Your parents were born in a high-risk country, and you have not been immunized against hepatitis B (hepatitis B vaccine).  You have HIV or AIDS.  You use needles to inject street drugs.  You live with someone who has hepatitis B.  You have had sex with someone who has hepatitis B.  You get hemodialysis  treatment.  You take certain medicines for conditions, including cancer, organ transplantation, and autoimmune conditions. Hepatitis C  Blood testing is recommended for:  Everyone born from [birth year] through 1964.  Anyone with known risk factors for hepatitis C. Sexually transmitted infections (STIs)  You should be screened for sexually transmitted infections (STIs) including gonorrhea and chlamydia if:  You are sexually active and are younger than 55 years of age.  You are older than 55 years of age and your health  care provider tells you that you are at risk for this type of infection.  Your sexual activity has changed since you were last screened and you are at an increased risk for chlamydia or gonorrhea. Ask your health care provider if you are at risk.  If you do not have HIV, but are at risk, it may be recommended that you take a prescription medicine daily to prevent HIV infection. This is called pre-exposure prophylaxis (PrEP). You are considered at risk if:  You are sexually active and do not regularly use condoms or know the HIV status of your partner(s).  You take drugs by injection.  You are sexually active with a partner who has HIV. Talk with your health care provider about whether you are at high risk of being infected with HIV. If you choose to begin PrEP, you should first be tested for HIV. You should then be tested every 3 months for as long as you are taking PrEP. Pregnancy  If you are premenopausal and you may become pregnant, ask your health care provider about preconception counseling.  If you may become pregnant, take 400 to 800 micrograms (mcg) of folic acid every day.  If you want to prevent pregnancy, talk to your health care provider about birth control (contraception). Osteoporosis and menopause  Osteoporosis is a disease in which the bones lose minerals and strength with aging. This can result in serious bone fractures. Your risk for osteoporosis can be  identified using a bone density scan.  If you are 51 years of age or older, or if you are at risk for osteoporosis and fractures, ask your health care provider if you should be screened.  Ask your health care provider whether you should take a calcium or vitamin D supplement to lower your risk for osteoporosis.  Menopause may have certain physical symptoms and risks.  Hormone replacement therapy may reduce some of these symptoms and risks. Talk to your health care provider about whether hormone replacement therapy is right for you. Follow these instructions at home:  Schedule regular health, dental, and eye exams.  Stay current with your immunizations.  Do not use any tobacco products including cigarettes, chewing tobacco, or electronic cigarettes.  If you are pregnant, do not drink alcohol.  If you are breastfeeding, limit how much and how often you drink alcohol.  Limit alcohol intake to no more than 1 drink per day for nonpregnant women. One drink equals 12 ounces of beer, 5 ounces of wine, or 1 ounces of hard liquor.  Do not use street drugs.  Do not share needles.  Ask your health care provider for help if you need support or information about quitting drugs.  Tell your health care provider if you often feel depressed.  Tell your health care provider if you have ever been abused or do not feel safe at home. This information is not intended to replace advice given to you by your health care provider. Make sure you discuss any questions you have with your health care provider. Document Released: 12/24/2010 Document Revised: 11/16/2015 Document Reviewed: 03/14/2015  2017 Elsevier

## 2016-07-06 LAB — HEPATITIS C ANTIBODY: HCV AB: NEGATIVE

## 2016-07-07 ENCOUNTER — Encounter: Payer: Self-pay | Admitting: Family

## 2016-07-09 ENCOUNTER — Encounter: Payer: Self-pay | Admitting: Family

## 2016-07-09 ENCOUNTER — Encounter: Payer: BLUE CROSS/BLUE SHIELD | Admitting: Family

## 2016-07-09 NOTE — Progress Notes (Signed)
Error

## 2016-07-24 ENCOUNTER — Telehealth: Payer: Self-pay | Admitting: Emergency Medicine

## 2016-07-24 DIAGNOSIS — M0579 Rheumatoid arthritis with rheumatoid factor of multiple sites without organ or systems involvement: Secondary | ICD-10-CM

## 2016-07-24 NOTE — Telephone Encounter (Signed)
Referral placed.

## 2016-07-24 NOTE — Telephone Encounter (Signed)
Pt called and stated [facility] She said Tammy SoursGreg knows why. Please advise thanks.

## 2016-07-28 ENCOUNTER — Other Ambulatory Visit: Payer: Self-pay | Admitting: Family

## 2016-07-29 ENCOUNTER — Encounter: Payer: Self-pay | Admitting: Family

## 2016-07-29 ENCOUNTER — Ambulatory Visit (INDEPENDENT_AMBULATORY_CARE_PROVIDER_SITE_OTHER): Payer: BLUE CROSS/BLUE SHIELD | Admitting: Family

## 2016-07-29 DIAGNOSIS — R05 Cough: Secondary | ICD-10-CM

## 2016-07-29 DIAGNOSIS — R059 Cough, unspecified: Secondary | ICD-10-CM

## 2016-07-29 MED ORDER — ALBUTEROL SULFATE HFA 108 (90 BASE) MCG/ACT IN AERS: 2.0000 | INHALATION_SPRAY | Freq: Four times a day (QID) | RESPIRATORY_TRACT | 1 refills | Status: DC | PRN

## 2016-07-29 MED ORDER — BREATHERITE COLL SPACER ADULT MISC
0 refills | Status: DC
Start: 2016-07-29 — End: 2016-10-25

## 2016-07-29 MED ORDER — AZITHROMYCIN 250 MG PO TABS: ORAL_TABLET | ORAL | 0 refills | Status: DC

## 2016-07-29 NOTE — Assessment & Plan Note (Signed)
Symptoms and exam are consistent with acute bronchitis and possibly asthmatic exacerbation. Start azithromycin. Continue with previously prescribed albuterol and Symbicort. Continue over-the-counter medications as needed for symptom relief and supportive care. Start prednisone as needed. Follow-up if symptoms worsen or do not improve.

## 2016-07-29 NOTE — Progress Notes (Signed)
Subjective:    Patient ID: Shannon Mckenzie Mceachern, female    DOB: December 01, 1960, 56 y.o.   MRN: [id number]  Chief Complaint  Patient presents with  . Cough    SOB really quick, cough, wheezing    HPI:  Shannon Mckenzie Ebeling is a 56 y.o. female who  has a past medical history of Allergy; Anxiety; Asthma; Depression; DJD (degenerative joint disease) of hip; Former smoker; GERD (gastroesophageal reflux disease); Hiatal hernia; Hyperlipidemia; Hypertension; Kidney stone; Reactive airway disease; RLS (restless legs syndrome); Routine gynecological examination; SOB (shortness of breath) (9/14); Statin intolerance; and Vision problems. and presents today for an acute office visit.  This is a new problem. Associated symptoms of increased shortness of breath, coughing and wheezing has been going on for about 1 week. No fevers. Cough is productive with brown and green sputum at times. Modifying factors include Mucinex-DM which she reports seems to help a little. Has had to use her albuterol.   Allergies  Allergen Reactions  . Statins     Myalgias with all statins  . Amoxicillin Hives and Rash      Outpatient Medications Prior to Visit  Medication Sig Dispense Refill  . budesonide-formoterol (SYMBICORT) 160-4.5 MCG/ACT inhaler INHALE 2 PUFFS INTO THE LUNGS TWICE A DAY 10.2 Inhaler 5  . Cholecalciferol (D3 HIGH POTENCY) 2000 units CAPS Take by mouth.    . Cinnamon 500 MG TABS Take by mouth.    . cycloSPORINE (RESTASIS) 0.05 % ophthalmic emulsion 1 drop 2 (two) times daily.    Marland Kitchen. esomeprazole (NEXIUM) 40 MG capsule Take 1 capsule (40 mg total) by mouth 2 (two) times daily. 180 capsule 3  . ezetimibe (ZETIA) 10 MG tablet TAKE ONE TABLET BY MOUTH ONCE DAILY 30 tablet 0  . ferrous fumarate (HEMOCYTE - 106 MG FE) 325 (106 FE) MG TABS tablet Take 1 tablet by mouth.    . fluticasone (FLONASE) 50 MCG/ACT nasal spray instill 2 sprays into each nostril once daily 16 g 1  . hydroxychloroquine (PLAQUENIL) 200 MG tablet Take  200 mg by mouth daily.    Marland Kitchen. leflunomide (ARAVA) 20 MG tablet Take 20 mg by mouth daily.    Marland Kitchen. losartan (COZAAR) 50 MG tablet Take 1 tablet (50 mg total) by mouth daily. 90 tablet 3  . montelukast (SINGULAIR) 10 MG tablet Take 1 tablet (10 mg total) by mouth at bedtime. 30 tablet 2  . Multiple Vitamin (MULTIVITAMIN) tablet Take 1 tablet by mouth daily.    Marland Kitchen. omega-3 acid ethyl esters (LOVAZA) 1 g capsule Take by mouth 2 (two) times daily.    . predniSONE (DELTASONE) 10 MG tablet 6 tab po day 1, 5 tab po day 2, 4 tab po day 3, 3 tab po day 4, 2 tab po day day 5, and 1 tab po day 6. 21 tablet 0  . sertraline (ZOLOFT) 100 MG tablet take 1 and 1/2 tablets by mouth at bedtime 135 tablet 1  . TURMERIC CURCUMIN PO Take by mouth.    Marland Kitchen. albuterol (VENTOLIN HFA) 108 (90 BASE) MCG/ACT inhaler Inhale 2 puffs into the lungs every 6 (six) hours as needed for wheezing. 1 Inhaler 1  . doxycycline (VIBRA-TABS) 100 MG tablet Take 1 tablet (100 mg total) by mouth 2 (two) times daily. 20 tablet 0  . estradiol (CLIMARA - DOSED IN MG/24 HR) 0.0375 mg/24hr patch Place 0.0375 mg onto the skin once a week.    . hydrocortisone 2.5 % cream Apply topically 2 (two) times daily. 453.6  g 0  . hydrOXYzine (ATARAX/VISTARIL) 25 MG tablet Take 1 tablet (25 mg total) by mouth 3 (three) times daily as needed for itching. 20 tablet 0  . progesterone (PROMETRIUM) 200 MG capsule Take 200 mg by mouth daily.     No facility-administered medications prior to visit.       Past Surgical History:  Procedure Laterality Date  . COLONOSCOPY  06/2012   Dr. Ramon Dredge; repeat 10 years  . UPPER GI ENDOSCOPY N/A 07/24/2012   [place] Hernia,normal stomach, normal examined duodenum      Past Medical History:  Diagnosis Date  . Allergy   . Anxiety   . Asthma   . Depression   . DJD (degenerative joint disease) of hip   . Former smoker   . GERD (gastroesophageal reflux disease)   . Hiatal hernia   . Hyperlipidemia   . Hypertension   .  Kidney stone   . Reactive airway disease   . RLS (restless legs syndrome)   . Routine gynecological examination    sees gynecology, [name] NP  . SOB (shortness of breath) 9/14   cardiac eval with stress test and echo; Dr. [name]  . Statin intolerance   . Vision problems    wears contact lenses      Review of Systems  Constitutional: Negative for chills and fever.  HENT: Positive for congestion. Negative for sinus pain, sinus pressure and sore throat.   Respiratory: Positive for cough, chest tightness, shortness of breath and wheezing.   Cardiovascular: Negative for chest pain, palpitations and leg swelling.  Neurological: Negative for headaches.      Objective:    BP (!) 140/92 (BP Location: Left Arm, Patient Position: Sitting, Cuff Size: Large)   Pulse 80   Temp 98 F (36.7 C) (Oral)   Resp 16   Ht 5\' 2"  (1.575 m)   Wt 176 lb 12.8 oz (80.2 kg)   SpO2 93%   BMI 32.34 kg/m  Nursing note and vital signs reviewed.  Physical Exam  Constitutional: She is oriented to person, place, and time. She appears well-developed and well-nourished.  Non-toxic appearance. She does not have a sickly appearance. She does not appear ill. No distress.  HENT:  Right Ear: Hearing, tympanic membrane, external ear and ear canal normal.  Left Ear: Hearing, tympanic membrane, external ear and ear canal normal.  Nose: Nose normal.  Mouth/Throat: Uvula is midline, oropharynx is clear and moist and mucous membranes are normal.  Cardiovascular: Normal rate, regular rhythm, normal heart sounds and intact distal pulses.   Pulmonary/Chest: Effort normal and breath sounds normal.  Neurological: She is alert and oriented to person, place, and time.  Skin: Skin is warm and dry. She is not diaphoretic.  Psychiatric: She has a normal mood and affect. Her behavior is normal. Judgment and thought content normal.       Assessment & Plan:   Problem List Items Addressed This Visit      Other    Cough    Symptoms and exam are consistent with acute bronchitis and possibly asthmatic exacerbation. Start azithromycin. Continue with previously prescribed albuterol and Symbicort. Continue over-the-counter medications as needed for symptom relief and supportive care. Start prednisone as needed. Follow-up if symptoms worsen or do not improve.          I have discontinued Ms. Woollard's hydrOXYzine, hydrocortisone, progesterone, estradiol, and doxycycline. I am also having her start on azithromycin and BREATHERITE COLL SPACER ADULT. Additionally, I am having  her maintain her ferrous fumarate, esomeprazole, losartan, predniSONE, budesonide-formoterol, sertraline, cycloSPORINE, leflunomide, fluticasone, montelukast, TURMERIC CURCUMIN PO, multivitamin, Cholecalciferol, omega-3 acid ethyl esters, Cinnamon, hydroxychloroquine, ezetimibe, and albuterol.   Meds ordered this encounter  Medications  . azithromycin (ZITHROMAX) 250 MG tablet    Sig: Take 2 tablets by mouth for 1 day and then 1 tablet by mouth daily for 4 days.    Dispense:  6 tablet    Refill:  0    Order Specific Question:   Supervising Provider    Answer:   Hillard Danker A [[id number]]  . albuterol (VENTOLIN HFA) 108 (90 Base) MCG/ACT inhaler    Sig: Inhale 2 puffs into the lungs every 6 (six) hours as needed for wheezing.    Dispense:  1 Inhaler    Refill:  1    Order Specific Question:   Supervising Provider    Answer:   Hillard Danker A [[id number]]  . Spacer/Aero-Holding Chambers (BREATHERITE COLL SPACER ADULT) MISC    Sig: Use device with inhaler as instructed.    Dispense:  1 each    Refill:  0    Substitution permissible per insurance coverage. Dx [id number]    Order Specific Question:   Supervising Provider    Answer:   Hillard Danker A [[id number]]     Follow-up: Return if symptoms worsen or fail to improve.  Jeanine Luz, FNP

## 2016-07-29 NOTE — Patient Instructions (Addendum)
Thank you for choosing [facility].  SUMMARY AND INSTRUCTIONS:  Nebulizer treatments 2-3 times per day for the next 2-3 days.  Prednisone as needed.  Continue with Symbicort Medication:  Your prescription(s) have been submitted to your pharmacy or been printed and provided for you. Please take as directed and contact our office if you believe you are having problem(s) with the medication(s) or have any questions.  Follow up:  If your symptoms worsen or fail to improve, please contact our office for further instruction, or in case of emergency go directly to the emergency room at the closest medical facility.   General Recommendations:    Please drink plenty of fluids.  Get plenty of rest   Sleep in humidified air  Use saline nasal sprays  Netti pot   OTC Medications:  Decongestants - helps relieve congestion   Flonase (generic fluticasone) or Nasacort (generic triamcinolone) - please make sure to use the "cross-over" technique at a 45 degree angle towards the opposite eye as opposed to straight up the nasal passageway.   Sudafed (generic pseudoephedrine - Note this is the one that is available behind the pharmacy counter); Products with phenylephrine (-PE) may also be used but is often not as effective as pseudoephedrine.   If you have HIGH BLOOD PRESSURE - Coricidin HBP; AVOID any product that is -D as this contains pseudoephedrine which may increase your blood pressure.  Afrin (oxymetazoline) every 6-8 hours for up to 3 days.   Allergies - helps relieve runny nose, itchy eyes and sneezing   Claritin (generic loratidine), Allegra (fexofenidine), or Zyrtec (generic cyrterizine) for runny nose. These medications should not cause drowsiness.  Note - Benadryl (generic diphenhydramine) may be used however may cause drowsiness  Cough -   Delsym or Robitussin (generic dextromethorphan)  Expectorants - helps loosen mucus to ease removal   Mucinex (generic  guaifenesin) as directed on the package.  Headaches / General Aches   Tylenol (generic acetaminophen) - DO NOT EXCEED 3 grams (3,000 mg) in a 24 hour time period  Advil/Motrin (generic ibuprofen)   Sore Throat -   Salt water gargle   Chloraseptic (generic benzocaine) spray or lozenges / Sucrets (generic dyclonine)

## 2016-08-02 ENCOUNTER — Other Ambulatory Visit: Payer: Self-pay | Admitting: Family

## 2016-08-20 DIAGNOSIS — M0579 Rheumatoid arthritis with rheumatoid factor of multiple sites without organ or systems involvement: Secondary | ICD-10-CM | POA: Diagnosis not present

## 2016-08-20 DIAGNOSIS — M2550 Pain in unspecified joint: Secondary | ICD-10-CM | POA: Diagnosis not present

## 2016-08-20 DIAGNOSIS — M150 Primary generalized (osteo)arthritis: Secondary | ICD-10-CM | POA: Diagnosis not present

## 2016-08-20 LAB — CBC AND DIFFERENTIAL
HCT: 41 (ref 36–46)
Hemoglobin: 13.7 (ref 12.0–16.0)
Neutrophils Absolute: 3
Platelets: 177 (ref 150–399)
WBC: 4.8

## 2016-08-20 LAB — POCT ERYTHROCYTE SEDIMENTATION RATE, NON-AUTOMATED: SED RATE: 2

## 2016-08-25 ENCOUNTER — Other Ambulatory Visit: Payer: Self-pay | Admitting: Family

## 2016-08-25 DIAGNOSIS — R6889 Other general symptoms and signs: Secondary | ICD-10-CM

## 2016-08-26 ENCOUNTER — Other Ambulatory Visit: Payer: Self-pay | Admitting: Family

## 2016-09-13 DIAGNOSIS — M0579 Rheumatoid arthritis with rheumatoid factor of multiple sites without organ or systems involvement: Secondary | ICD-10-CM | POA: Diagnosis not present

## 2016-09-13 DIAGNOSIS — M2550 Pain in unspecified joint: Secondary | ICD-10-CM | POA: Diagnosis not present

## 2016-09-13 DIAGNOSIS — M150 Primary generalized (osteo)arthritis: Secondary | ICD-10-CM | POA: Diagnosis not present

## 2016-09-14 ENCOUNTER — Ambulatory Visit (INDEPENDENT_AMBULATORY_CARE_PROVIDER_SITE_OTHER): Payer: BLUE CROSS/BLUE SHIELD | Admitting: Family Medicine

## 2016-09-14 ENCOUNTER — Encounter: Payer: Self-pay | Admitting: Family Medicine

## 2016-09-14 VITALS — BP 152/86 | HR 85 | Temp 97.5°F | Resp 16 | Wt 177.0 lb

## 2016-09-14 DIAGNOSIS — J45901 Unspecified asthma with (acute) exacerbation: Secondary | ICD-10-CM | POA: Diagnosis not present

## 2016-09-14 MED ORDER — AZITHROMYCIN 250 MG PO TABS: ORAL_TABLET | ORAL | 0 refills | Status: AC

## 2016-09-14 NOTE — Progress Notes (Signed)
Subjective:     Patient ID: Shannon Mckenzie, female   DOB: July 14, 1960, 56 y.o.   MRN: [id number]  HPI Patient has history of asthma and is seen with one-week history of cough productive of green sputum. She's had some nasal congestion and intermittent headaches. No fever. She takes Symbicort regularly and has used her rescue inhaler about 3 times this week. She is aware of some wheezing.  Non-smoker.  Past Medical History:  Diagnosis Date  . Allergy   . Anxiety   . Asthma   . Depression   . DJD (degenerative joint disease) of hip   . Former smoker   . GERD (gastroesophageal reflux disease)   . Hiatal hernia   . Hyperlipidemia   . Hypertension   . Kidney stone   . Reactive airway disease   . RLS (restless legs syndrome)   . Routine gynecological examination    sees gynecology, Shannon Mckenzie  . SOB (shortness of breath) 9/14   cardiac eval with stress test and echo; Shannon Mckenzie  . Statin intolerance   . Vision problems    wears contact lenses   Past Surgical History:  Procedure Laterality Date  . COLONOSCOPY  06/2012   Dr. Ramon Mckenzie; repeat 10 years  . UPPER GI ENDOSCOPY N/A 07/24/2012   Hiatus Hernia,normal stomach, normal examined duodenum    reports that she quit smoking about 25 years ago. Her smoking use included Cigarettes. She has a 15.00 pack-year smoking history. She has never used smokeless tobacco. She reports that she does not drink alcohol or use drugs. family history includes Arthritis in her mother; Asthma in her mother and sister; COPD in her mother; Cancer in her father; Cirrhosis in her mother; Depression in her brother; Diabetes in her mother; Heart disease in her mother; Other in her brother; Stroke in her maternal aunt. Allergies  Allergen Reactions  . Statins     Myalgias with all statins  . Amoxicillin Hives and Rash     Review of Systems  Constitutional: Positive for fatigue. Negative for fever.  HENT: Positive for congestion. Negative for sore  throat.   Respiratory: Positive for cough.   Cardiovascular: Negative for chest pain.  Neurological: Positive for headaches.       Objective:   Physical Exam  Constitutional: She appears well-developed and well-nourished.  Cardiovascular: Normal rate and regular rhythm.   Pulmonary/Chest: Effort normal and breath sounds normal. No respiratory distress. She has no wheezes. She has no rales.       Assessment:     Patient has persistent asthma seen with acute exacerbation    Plan:     -She has prednisone at home and we recommend she consider 20 mg 2 tablets daily for 5 days -Continue with her usual inhalers and continue albuterol as rescue inhaler when necessary -Zithromax for 5 days -Follow up promptly for any fever or worsening symptoms  Shannon CoveyBruce W Barton Want MD Shannon Mckenzie

## 2016-09-14 NOTE — Patient Instructions (Addendum)
Consider OTC Mucinex twice daily Continue with albuterol rescue inhaler as needed. Take Prednisone 20 mg two tablets once daily for 5 days.

## 2016-09-14 NOTE — Progress Notes (Signed)
Pre visit review using our clinic review tool, if applicable. No additional management support is needed unless otherwise documented below in the visit note. 

## 2016-09-24 ENCOUNTER — Other Ambulatory Visit: Payer: Self-pay | Admitting: Family

## 2016-09-24 DIAGNOSIS — R6889 Other general symptoms and signs: Secondary | ICD-10-CM

## 2016-09-30 ENCOUNTER — Other Ambulatory Visit: Payer: Self-pay | Admitting: Family

## 2016-10-01 ENCOUNTER — Other Ambulatory Visit: Payer: Self-pay | Admitting: Family

## 2016-10-01 ENCOUNTER — Ambulatory Visit (INDEPENDENT_AMBULATORY_CARE_PROVIDER_SITE_OTHER): Payer: BLUE CROSS/BLUE SHIELD | Admitting: Nurse Practitioner

## 2016-10-01 ENCOUNTER — Ambulatory Visit (INDEPENDENT_AMBULATORY_CARE_PROVIDER_SITE_OTHER)
Admission: RE | Admit: 2016-10-01 | Discharge: 2016-10-01 | Disposition: A | Discharge status: Home / Self Care | Payer: BLUE CROSS/BLUE SHIELD | Source: Ambulatory Visit | Attending: Nurse Practitioner | Admitting: Nurse Practitioner

## 2016-10-01 ENCOUNTER — Encounter: Payer: Self-pay | Admitting: Nurse Practitioner

## 2016-10-01 VITALS — BP 142/98 | HR 77 | Temp 97.9°F | Ht 62.0 in | Wt 175.0 lb

## 2016-10-01 DIAGNOSIS — R0602 Shortness of breath: Secondary | ICD-10-CM | POA: Diagnosis not present

## 2016-10-01 DIAGNOSIS — M2550 Pain in unspecified joint: Secondary | ICD-10-CM | POA: Diagnosis not present

## 2016-10-01 DIAGNOSIS — M150 Primary generalized (osteo)arthritis: Secondary | ICD-10-CM | POA: Diagnosis not present

## 2016-10-01 DIAGNOSIS — J209 Acute bronchitis, unspecified: Secondary | ICD-10-CM

## 2016-10-01 DIAGNOSIS — M0579 Rheumatoid arthritis with rheumatoid factor of multiple sites without organ or systems involvement: Secondary | ICD-10-CM | POA: Diagnosis not present

## 2016-10-01 DIAGNOSIS — M7989 Other specified soft tissue disorders: Secondary | ICD-10-CM | POA: Diagnosis not present

## 2016-10-01 DIAGNOSIS — R05 Cough: Secondary | ICD-10-CM | POA: Diagnosis not present

## 2016-10-01 MED ORDER — DM-GUAIFENESIN ER 30-600 MG PO TB12: 1.0000 | ORAL_TABLET | Freq: Two times a day (BID) | ORAL | 0 refills | Status: DC | PRN

## 2016-10-01 MED ORDER — IPRATROPIUM-ALBUTEROL 0.5-2.5 (3) MG/3ML IN SOLN
3.0000 mL | Freq: Once | RESPIRATORY_TRACT | Status: AC
Start: 2016-10-01 — End: 2016-10-01
  Administered 2016-10-01: 3 mL via RESPIRATORY_TRACT

## 2016-10-01 MED ORDER — METHYLPREDNISOLONE ACETATE 40 MG/ML IJ SUSP
40.0000 mg | Freq: Once | INTRAMUSCULAR | Status: AC
  Administered 2016-10-01: 40 mg via INTRAMUSCULAR

## 2016-10-01 NOTE — Patient Instructions (Addendum)
Normal CXR.  Encourage adequate oral hydration.  Use saline sinus irrigation once a day prn.

## 2016-10-01 NOTE — Progress Notes (Signed)
Subjective:  Patient ID: Shannon Mckenzie, female    DOB: Jul 27, 1960  Age: 56 y.o. MRN: [id number]  CC: Cough (coughing brown/green mucus/SOB)   Cough  This is a recurrent problem. The current episode started more than 1 month ago. The problem has been waxing and waning. The problem occurs constantly. The cough is productive of sputum. Associated symptoms include nasal congestion, postnasal drip, rhinorrhea, shortness of breath and wheezing. Pertinent negatives include no chest pain or chills. The symptoms are aggravated by lying down and cold air. She has tried OTC cough suppressant, steroid inhaler and a beta-agonist inhaler (2 courses of abx) for the symptoms. The treatment provided moderate relief. Her past medical history is significant for asthma, bronchitis and environmental allergies.   Outpatient Medications Prior to Visit  Medication Sig Dispense Refill  . albuterol (VENTOLIN HFA) 108 (90 Base) MCG/ACT inhaler Inhale 2 puffs into the lungs every 6 (six) hours as needed for wheezing. 1 Inhaler 1  . Cholecalciferol (D3 HIGH POTENCY) 2000 units CAPS Take by mouth.    . cycloSPORINE (RESTASIS) 0.05 % ophthalmic emulsion 1 drop 2 (two) times daily.    Marland Kitchen esomeprazole (NEXIUM) 40 MG capsule Take 1 capsule (40 mg total) by mouth 2 (two) times daily. 180 capsule 3  . ezetimibe (ZETIA) 10 MG tablet TAKE ONE TABLET BY MOUTH ONCE DAILY 30 tablet 0  . ferrous fumarate (HEMOCYTE - 106 MG FE) 325 (106 FE) MG TABS tablet Take 1 tablet by mouth.    . losartan (COZAAR) 50 MG tablet TAKE 1 TABLET BY MOUTH EVERY DAY 90 tablet 0  . meloxicam (MOBIC) 15 MG tablet Take 15 mg by mouth daily.    . montelukast (SINGULAIR) 10 MG tablet TAKE 1 TABLET(10 MG) BY MOUTH AT BEDTIME 30 tablet 0  . Multiple Vitamin (MULTIVITAMIN) tablet Take 1 tablet by mouth daily.    Marland Kitchen omega-3 acid ethyl esters (LOVAZA) 1 g capsule Take by mouth 2 (two) times daily.    . sertraline (ZOLOFT) 100 MG tablet TAKE 1 AND 1/2 TABLETS BY  MOUTH EVERY NIGHT AT BEDTIME 135 tablet 0  . Spacer/Aero-Holding Chambers (BREATHERITE COLL SPACER ADULT) MISC Use device with inhaler as instructed. 1 each 0  . SYMBICORT 160-4.5 MCG/ACT inhaler INHALE 2 PUFFS BY MOUTH TWICE DAILY 10.2 g 0  . TURMERIC CURCUMIN PO Take by mouth.    . fluticasone (FLONASE) 50 MCG/ACT nasal spray INSTILL 2 SPRAYS INTO EACH NOSTRIL ONCE DAILY 16 g 0   No facility-administered medications prior to visit.     ROS See HPI  Objective:  BP (!) 142/98   Pulse 77   Temp 97.9 F (36.6 C)   Ht  (1.575 m)   Wt 175 lb (79.4 kg)   SpO2 95%   BMI 32.01 kg/m   BP Readings from Last 3 Encounters:  10/01/16 (!) 142/98  09/14/16 (!) 152/86  07/29/16 (!) 140/92    Wt Readings from Last 3 Encounters:  10/01/16 175 lb (79.4 kg)  09/14/16 177 lb (80.3 kg)  07/29/16 176 lb 12.8 oz (80.2 kg)    Physical Exam  Constitutional: She is oriented to person, place, and time. No distress.  Cardiovascular: Normal rate.   Pulmonary/Chest: Effort normal. No respiratory distress. She has wheezes. She has no rales.  Lymphadenopathy:    She has no cervical adenopathy.  Neurological: She is alert and oriented to person, place, and time.  Skin: Skin is warm and dry.  Vitals reviewed.   Lab  Results  Component Value Date   WBC 6.3 07/05/2016   HGB 14.0 07/05/2016   HCT 40.5 07/05/2016   PLT 174.0 07/05/2016   GLUCOSE 82 07/05/2016   CHOL 252 (H) 07/05/2016   TRIG 53.0 07/05/2016   HDL 59.50 07/05/2016   LDLCALC 182 (H) 07/05/2016   ALT 19 07/05/2016   AST 21 07/05/2016   NA 142 07/05/2016   K 3.8 07/05/2016   CL 107 07/05/2016   CREATININE 0.68 07/05/2016   BUN 14 07/05/2016   CO2 28 07/05/2016   TSH 3.62 01/17/2016   HGBA1C 5.2 05/03/2014    Dg Chest 2 View  Result Date: 01/17/2016 CLINICAL DATA:  Shortness of breath for 2 days.  Mild chest pain. EXAM: CHEST  2 VIEW COMPARISON:  08/25/2015 FINDINGS: Airspace disease noted peripherally in the right  lung base concerning for right middle lobe pneumonia. Left lung is clear. Heart is upper limits normal in size. No effusions or acute bony abnormality. IMPRESSION: Right basilar/middle lobe pneumonia. Electronically Signed   By: Charlett Nose M.D.   On: 01/17/2016 13:15   Assessment & Plan:   Shannon Mckenzie was seen today for cough.  Diagnoses and all orders for this visit:  Acute bronchitis, unspecified organism -     DG Chest 2 View; Future -     ipratropium-albuterol (DUONEB) 0.5-2.5 (3) MG/3ML nebulizer solution 3 mL; Take 3 mLs by nebulization once. -     methylPREDNISolone acetate (DEPO-MEDROL) injection 40 mg; Inject 1 mL (40 mg total) into the muscle once. -     dextromethorphan-guaiFENesin (MUCINEX DM) 30-600 MG 12hr tablet; Take 1 tablet by mouth 2 (two) times daily as needed for cough. -     fluticasone (FLOVENT HFA) 110 MCG/ACT inhaler; Inhale 1 puff into the lungs 2 times daily at 12 noon and 4 pm.   I am having Shannon Mckenzie start on dextromethorphan-guaiFENesin and fluticasone. I am also having her maintain her ferrous fumarate, esomeprazole, cycloSPORINE, TURMERIC CURCUMIN PO, multivitamin, Cholecalciferol, omega-3 acid ethyl esters, albuterol, BREATHERITE COLL SPACER ADULT, losartan, meloxicam, montelukast, ezetimibe, SYMBICORT, sertraline, Melatonin, B-12, Fexofenadine HCl (ALLEGRA ALLERGY PO), and Fexofenadine HCl (MUCINEX ALLERGY PO). We administered ipratropium-albuterol and methylPREDNISolone acetate.  Meds ordered this encounter  Medications  . Melatonin 5 MG TABS    Sig: Take by mouth.  . Cyanocobalamin (B-12) 1000 MCG CAPS    Sig: Take by mouth.  . Fexofenadine HCl (ALLEGRA ALLERGY PO)    Sig: Take by mouth.  . Fexofenadine HCl (MUCINEX ALLERGY PO)    Sig: Take by mouth.  Marland Kitchen ipratropium-albuterol (DUONEB) 0.5-2.5 (3) MG/3ML nebulizer solution 3 mL  . methylPREDNISolone acetate (DEPO-MEDROL) injection 40 mg  . dextromethorphan-guaiFENesin (MUCINEX DM) 30-600 MG 12hr tablet     Sig: Take 1 tablet by mouth 2 (two) times daily as needed for cough.    Dispense:  14 tablet    Refill:  0    Order Specific Question:   Supervising Provider    Answer:   Tresa Garter [[id number]]  . fluticasone (FLOVENT HFA) 110 MCG/ACT inhaler    Sig: Inhale 1 puff into the lungs 2 times daily at 12 noon and 4 pm.    Dispense:  1 Inhaler    Refill:  4    Order Specific Question:   Supervising Provider    Answer:   Tresa Garter [[id number]]    Follow-up: Return if symptoms worsen or fail to improve.  Alysia Penna, NP

## 2016-10-01 NOTE — Progress Notes (Signed)
Pre visit review using our clinic review tool, if applicable. No additional management support is needed unless otherwise documented below in the visit note. 

## 2016-10-03 MED ORDER — FLUTICASONE PROPIONATE HFA 110 MCG/ACT IN AERO: 1.0000 | INHALATION_SPRAY | Freq: Two times a day (BID) | RESPIRATORY_TRACT | 4 refills | Status: DC

## 2016-10-04 ENCOUNTER — Telehealth: Payer: Self-pay | Admitting: Family

## 2016-10-04 ENCOUNTER — Encounter: Payer: Self-pay | Admitting: Internal Medicine

## 2016-10-04 ENCOUNTER — Ambulatory Visit (INDEPENDENT_AMBULATORY_CARE_PROVIDER_SITE_OTHER): Payer: BLUE CROSS/BLUE SHIELD | Admitting: Internal Medicine

## 2016-10-04 VITALS — BP 148/88 | HR 71 | Temp 98.1°F | Wt 175.0 lb

## 2016-10-04 DIAGNOSIS — R05 Cough: Secondary | ICD-10-CM | POA: Diagnosis not present

## 2016-10-04 DIAGNOSIS — I10 Essential (primary) hypertension: Secondary | ICD-10-CM

## 2016-10-04 DIAGNOSIS — J45901 Unspecified asthma with (acute) exacerbation: Secondary | ICD-10-CM | POA: Diagnosis not present

## 2016-10-04 DIAGNOSIS — R059 Cough, unspecified: Secondary | ICD-10-CM

## 2016-10-04 MED ORDER — HYDROCODONE-HOMATROPINE 5-1.5 MG/5ML PO SYRP: 5.0000 mL | ORAL_SOLUTION | Freq: Four times a day (QID) | ORAL | 0 refills | Status: AC | PRN

## 2016-10-04 MED ORDER — AZITHROMYCIN 250 MG PO TABS: ORAL_TABLET | ORAL | 1 refills | Status: DC

## 2016-10-04 MED ORDER — PREDNISONE 10 MG PO TABS: ORAL_TABLET | ORAL | 0 refills | Status: DC

## 2016-10-04 MED ORDER — METHYLPREDNISOLONE ACETATE 80 MG/ML IJ SUSP
80.0000 mg | Freq: Once | INTRAMUSCULAR | Status: AC
  Administered 2016-10-04: 80 mg via INTRAMUSCULAR

## 2016-10-04 NOTE — Progress Notes (Signed)
Subjective:    Patient ID: Shannon Mckenzie, female    DOB: [birth year], 56 y.o.   MRN: [id number]  HPI  Here with acute onset mild to mod 2 wksT, HA, general weakness and malaise, with prod cough greenish sputum, but Pt denies chest pain, increased sob or doe, wheezing, orthopnea, PND, increased LE swelling, palpitations, dizziness or syncope, except for mild wheezing/sob since last 2-3 days.  Pt denies new neurological symptoms such as new headache, or facial or extremity weakness or numbness   Pt denies polydipsia, polyuria Past Medical History:  Diagnosis Date  . Allergy   . Anxiety   . Asthma   . Depression   . DJD (degenerative joint disease) of hip   . Former smoker   . GERD (gastroesophageal reflux disease)   . Hiatal hernia   . Hyperlipidemia   . Hypertension   . Kidney stone   . Reactive airway disease   . RLS (restless legs syndrome)   . Routine gynecological examination    sees gynecology, [name] NP  . SOB (shortness of breath) 9/14   cardiac eval with stress test and echo; Dr. [name]  . Statin intolerance   . Vision problems    wears contact lenses   Past Surgical History:  Procedure Laterality Date  . COLONOSCOPY  06/2012   Dr. Ramon Dredge; repeat 10 years  . UPPER GI ENDOSCOPY N/A 07/24/2012   [place] Hernia,normal stomach, normal examined duodenum    reports that she quit smoking about 25 years ago. Her smoking use included Cigarettes. She has a 15.00 pack-year smoking history. She has never used smokeless tobacco. She reports that she does not drink alcohol or use drugs. family history includes Arthritis in her mother; Asthma in her mother and sister; COPD in her mother; Cancer in her father; Cirrhosis in her mother; Depression in her brother; Diabetes in her mother; Heart disease in her mother; Other in her brother; Stroke in her maternal aunt. Allergies  Allergen Reactions  . Statins     Myalgias with all statins  . Amoxicillin Hives and Rash   Current  Outpatient Prescriptions on File Prior to Visit  Medication Sig Dispense Refill  . albuterol (VENTOLIN HFA) 108 (90 Base) MCG/ACT inhaler Inhale 2 puffs into the lungs every 6 (six) hours as needed for wheezing. 1 Inhaler 1  . Cholecalciferol (D3 HIGH POTENCY) 2000 units CAPS Take by mouth.    . Cyanocobalamin (B-12) 1000 MCG CAPS Take by mouth.    . cycloSPORINE (RESTASIS) 0.05 % ophthalmic emulsion 1 drop 2 (two) times daily.    Marland Kitchen dextromethorphan-guaiFENesin (MUCINEX DM) 30-600 MG 12hr tablet Take 1 tablet by mouth 2 (two) times daily as needed for cough. 14 tablet 0  . esomeprazole (NEXIUM) 40 MG capsule Take 1 capsule (40 mg total) by mouth 2 (two) times daily. 180 capsule 3  . ezetimibe (ZETIA) 10 MG tablet TAKE ONE TABLET BY MOUTH ONCE DAILY 30 tablet 0  . ferrous fumarate (HEMOCYTE - 106 MG FE) 325 (106 FE) MG TABS tablet Take 1 tablet by mouth.    . Fexofenadine HCl (ALLEGRA ALLERGY PO) Take by mouth.    . Fexofenadine HCl (MUCINEX ALLERGY PO) Take by mouth.    . fluticasone (FLONASE) 50 MCG/ACT nasal spray SHAKE LIQUID AND USE 2 SPRAYS IN EACH NOSTRIL EVERY DAY 16 g 0  . fluticasone (FLOVENT HFA) 110 MCG/ACT inhaler Inhale 1 puff into the lungs 2 times daily at 12 noon and 4 pm. 1  Inhaler 4  . losartan (COZAAR) 50 MG tablet TAKE 1 TABLET BY MOUTH EVERY DAY 90 tablet 0  . Melatonin 5 MG TABS Take by mouth.    . meloxicam (MOBIC) 15 MG tablet Take 15 mg by mouth daily.    . montelukast (SINGULAIR) 10 MG tablet TAKE 1 TABLET(10 MG) BY MOUTH AT BEDTIME 30 tablet 0  . Multiple Vitamin (MULTIVITAMIN) tablet Take 1 tablet by mouth daily.    Marland Kitchen omega-3 acid ethyl esters (LOVAZA) 1 g capsule Take by mouth 2 (two) times daily.    . sertraline (ZOLOFT) 100 MG tablet TAKE 1 AND 1/2 TABLETS BY MOUTH EVERY NIGHT AT BEDTIME 135 tablet 0  . Spacer/Aero-Holding Chambers (BREATHERITE COLL SPACER ADULT) MISC Use device with inhaler as instructed. 1 each 0  . SYMBICORT 160-4.5 MCG/ACT inhaler INHALE 2  PUFFS BY MOUTH TWICE DAILY 10.2 g 0  . TURMERIC CURCUMIN PO Take by mouth.     No current facility-administered medications on file prior to visit.    Review of Systems  Constitutional: Negative for other unusual diaphoresis or sweats HENT: Negative for ear discharge or swelling Eyes: Negative for other worsening visual disturbances Respiratory: Negative for stridor or other swelling  Gastrointestinal: Negative for worsening distension or other blood Genitourinary: Negative for retention or other urinary change Musculoskeletal: Negative for other MSK pain or swelling Skin: Negative for color change or other new lesions Neurological: Negative for worsening tremors and other numbness  Psychiatric/Behavioral: Negative for worsening agitation or other fatigue All other system neg per pt    Objective:   Physical Exam BP (!) 148/88   Pulse 71   Temp 98.1 F (36.7 C) (Oral)   Wt 175 lb (79.4 kg)   SpO2 98%   BMI 32.01 kg/m  VS noted, mild ill appearing Constitutional: Pt appears in NAD HENT: Head: NCAT.  Right Ear: External ear normal.  Left Ear: External ear normal. Bilat tm's with mild erythema.  Max sinus areas non tender.  Pharynx with mild erythema, no exudate  Eyes: . Pupils are equal, round, and reactive to light. Conjunctivae and EOM are normal Nose: without d/c or deformity Neck: Neck supple. Gross normal ROM Cardiovascular: Normal rate and regular rhythm.   Pulmonary/Chest: Effort normal and breath sounds decreased without rales but with few bilat wheezing.  Abd:  Soft, NT, ND, + BS, no organomegaly Neurological: Pt is alert. At baseline orientation, motor grossly intact Skin: Skin is warm. No rashes, other new lesions, no LE edema Psychiatric: Pt behavior is normal without agitation  No other exam findings    Assessment & Plan:

## 2016-10-04 NOTE — Patient Instructions (Signed)
You had the steroid shot today  Please take all new medication as prescribed - the antibiotic, cough medicine if needed, and prednisone  Please continue all other medications as before, and refills have been done if requested.  Please have the pharmacy call with any other refills you may need.  Please keep your appointments with your specialists as you may have planned   

## 2016-10-04 NOTE — Progress Notes (Signed)
Pre visit review using our clinic review tool, if applicable. No additional management support is needed unless otherwise documented below in the visit note. 

## 2016-10-05 NOTE — Assessment & Plan Note (Signed)
Mild elev today, likely reactive, o/w stable overall by history and exam, recent data reviewed with pt, and pt to continue medical treatment as before,  to f/u any worsening symptoms or concerns

## 2016-10-05 NOTE — Assessment & Plan Note (Signed)
Mild to mod, for depomedrol IM 80, predpac asd, to f/u any worsening symptoms or concerns 

## 2016-10-05 NOTE — Assessment & Plan Note (Signed)
Mild to mod, c/w bronchitis vs pna, declines cxr, for antibx course,  Cough med prn, to f/u any worsening symptoms or concerns 

## 2016-10-18 DIAGNOSIS — M47812 Spondylosis without myelopathy or radiculopathy, cervical region: Secondary | ICD-10-CM | POA: Diagnosis not present

## 2016-10-18 DIAGNOSIS — G5603 Carpal tunnel syndrome, bilateral upper limbs: Secondary | ICD-10-CM | POA: Diagnosis not present

## 2016-10-25 ENCOUNTER — Encounter: Payer: Self-pay | Admitting: Internal Medicine

## 2016-10-25 ENCOUNTER — Other Ambulatory Visit (INDEPENDENT_AMBULATORY_CARE_PROVIDER_SITE_OTHER): Payer: BLUE CROSS/BLUE SHIELD

## 2016-10-25 ENCOUNTER — Ambulatory Visit: Payer: BLUE CROSS/BLUE SHIELD | Admitting: Internal Medicine

## 2016-10-25 ENCOUNTER — Other Ambulatory Visit (HOSPITAL_COMMUNITY)
Admission: RE | Admit: 2016-10-25 | Discharge: 2016-10-25 | Disposition: A | Discharge status: Home / Self Care | Payer: BLUE CROSS/BLUE SHIELD | Source: Ambulatory Visit | Attending: Family Medicine | Admitting: Family Medicine

## 2016-10-25 ENCOUNTER — Ambulatory Visit (INDEPENDENT_AMBULATORY_CARE_PROVIDER_SITE_OTHER): Payer: BLUE CROSS/BLUE SHIELD | Admitting: Internal Medicine

## 2016-10-25 ENCOUNTER — Other Ambulatory Visit: Payer: Self-pay | Admitting: Family

## 2016-10-25 ENCOUNTER — Encounter: Payer: Self-pay | Admitting: Family Medicine

## 2016-10-25 ENCOUNTER — Ambulatory Visit (INDEPENDENT_AMBULATORY_CARE_PROVIDER_SITE_OTHER): Payer: BLUE CROSS/BLUE SHIELD | Admitting: Family Medicine

## 2016-10-25 VITALS — BP 124/80 | HR 77 | Ht 62.0 in | Wt 178.0 lb

## 2016-10-25 VITALS — BP 132/82 | HR 92 | Temp 98.2°F | Ht 62.0 in | Wt 178.2 lb

## 2016-10-25 DIAGNOSIS — J449 Chronic obstructive pulmonary disease, unspecified: Secondary | ICD-10-CM | POA: Insufficient documentation

## 2016-10-25 DIAGNOSIS — R05 Cough: Secondary | ICD-10-CM

## 2016-10-25 DIAGNOSIS — Z1283 Encounter for screening for malignant neoplasm of skin: Secondary | ICD-10-CM | POA: Diagnosis not present

## 2016-10-25 DIAGNOSIS — B0089 Other herpesviral infection: Secondary | ICD-10-CM | POA: Diagnosis not present

## 2016-10-25 DIAGNOSIS — J4530 Mild persistent asthma, uncomplicated: Secondary | ICD-10-CM | POA: Diagnosis not present

## 2016-10-25 DIAGNOSIS — Z7251 High risk heterosexual behavior: Secondary | ICD-10-CM | POA: Diagnosis not present

## 2016-10-25 DIAGNOSIS — L728 Other follicular cysts of the skin and subcutaneous tissue: Secondary | ICD-10-CM | POA: Diagnosis not present

## 2016-10-25 DIAGNOSIS — B078 Other viral warts: Secondary | ICD-10-CM | POA: Diagnosis not present

## 2016-10-25 DIAGNOSIS — R058 Other specified cough: Secondary | ICD-10-CM

## 2016-10-25 DIAGNOSIS — R6889 Other general symptoms and signs: Secondary | ICD-10-CM

## 2016-10-25 LAB — CBC WITH DIFFERENTIAL/PLATELET
BASOS ABS: 0 10*3/uL (ref 0.0–0.1)
Basophils Relative: 0.8 % (ref 0.0–3.0)
EOS ABS: 0.4 10*3/uL (ref 0.0–0.7)
Eosinophils Relative: 6.3 % — ABNORMAL HIGH (ref 0.0–5.0)
HEMATOCRIT: 41.5 % (ref 36.0–46.0)
HEMOGLOBIN: 14 g/dL (ref 12.0–15.0)
LYMPHS PCT: 29.9 % (ref 12.0–46.0)
Lymphs Abs: 1.7 10*3/uL (ref 0.7–4.0)
MCHC: 33.7 g/dL (ref 30.0–36.0)
MCV: 89.4 fl (ref 78.0–100.0)
MONO ABS: 0.4 10*3/uL (ref 0.1–1.0)
Monocytes Relative: 7.9 % (ref 3.0–12.0)
Neutro Abs: 3.1 10*3/uL (ref 1.4–7.7)
Neutrophils Relative %: 55.1 % (ref 43.0–77.0)
Platelets: 173 10*3/uL (ref 150.0–400.0)
RBC: 4.65 Mil/uL (ref 3.87–5.11)
RDW: 13.5 % (ref 11.5–15.5)
WBC: 5.6 10*3/uL (ref 4.0–10.5)

## 2016-10-25 NOTE — Progress Notes (Signed)
Subjective:  Shannon Mckenzie is a 56 y.o. year old very pleasant female patient who presents for/with See problem oriented charting ROS- no fever or chills. No expanding redness. Does have mildly painful rash on buttocks.    Past Medical History-  Patient Active Problem List   Diagnosis Date Noted  . Asthma exacerbation 10/04/2016  . Abscess of skin 07/05/2016  . Iron deficiency 02/20/2016  . Shortness of breath 01/17/2016  . Bronchitis 06/05/2015  . Osteoarthritis 03/13/2015  . Cough 03/13/2015  . Back pain 11/10/2014  . Rash and nonspecific skin eruption 07/21/2014  . Encounter for general adult medical examination with abnormal findings 07/06/2014  . Numbness and tingling 05/03/2014  . Cold feeling 05/03/2014  . Sinusitis, chronic 06/14/2013  . Anemia 04/16/2013  . Extrinsic asthma, mild persistent, uncomplicated 03/20/2013  . Upper airway cough syndrome ? all related to sinus dz 03/19/2013  . Essential hypertension, benign 04/15/2012  . Obesity 04/15/2012  . Hyperlipidemia 04/15/2012    Medications- reviewed and updated Current Outpatient Prescriptions  Medication Sig Dispense Refill  . albuterol (VENTOLIN HFA) 108 (90 Base) MCG/ACT inhaler Inhale 2 puffs into the lungs every 6 (six) hours as needed for wheezing. 1 Inhaler 1  . Cholecalciferol (D3 HIGH POTENCY) 2000 units CAPS Take by mouth.    . Cyanocobalamin (B-12) 1000 MCG CAPS Take by mouth.    . esomeprazole (NEXIUM) 40 MG capsule Take 1 capsule (40 mg total) by mouth 2 (two) times daily. 180 capsule 3  . ezetimibe (ZETIA) 10 MG tablet TAKE ONE TABLET BY MOUTH ONCE DAILY 30 tablet 5  . ferrous fumarate (HEMOCYTE - 106 MG FE) 325 (106 FE) MG TABS tablet Take 1 tablet by mouth.    . fluticasone (FLONASE) 50 MCG/ACT nasal spray SHAKE LIQUID AND USE 2 SPRAYS IN EACH NOSTRIL EVERY DAY 16 g 0  . losartan (COZAAR) 50 MG tablet TAKE 1 TABLET BY MOUTH EVERY DAY 90 tablet 0  . Melatonin 5 MG TABS Take by mouth.    . meloxicam  (MOBIC) 15 MG tablet Take 15 mg by mouth daily.    . montelukast (SINGULAIR) 10 MG tablet TAKE 1 TABLET(10 MG) BY MOUTH AT BEDTIME 30 tablet 5  . Multiple Vitamin (MULTIVITAMIN) tablet Take 1 tablet by mouth daily.    . sertraline (ZOLOFT) 100 MG tablet TAKE 1 AND 1/2 TABLETS BY MOUTH EVERY NIGHT AT BEDTIME 135 tablet 0  . SYMBICORT 160-4.5 MCG/ACT inhaler INHALE 2 PUFFS BY MOUTH TWICE DAILY 10.2 g 5  . TURMERIC CURCUMIN PO Take by mouth.     No current facility-administered medications for this visit.     Objective: BP 132/82 (BP Location: Left Arm, Patient Position: Sitting, Cuff Size: Large)   Pulse 92   Temp 98.2 F (36.8 C) (Oral)   Ht 5\' 2"  (1.575 m)   Wt 178 lb 3.2 oz (80.8 kg)   SpO2 95%   BMI 32.59 kg/m  Gen: NAD, resting comfortably CV: RRR no murmurs rubs or gallops Lungs: CTAB no crackles, wheeze, rhonchi Abdomen: soft/nontender/nondistended. obese Ext: no edema Skin: warm, dry On left buttocks in s2 distribution has 6 erythematous now scabbed over lesions. 4 are in a group then other 2 are spaced both above and below this area  Assessment/Plan:  Unprotected sex - Plan: HIV antibody, RPR, Urine cytology ancillary only, HSV(herpes simplex vrs) 1+2 ab-IgG, HSV(herpes simplex vrs) 1+2 ab-IgM, HIV antibody, RPR, HSV(herpes simplex vrs) 1+2 ab-IgG, HSV(herpes simplex vrs) 1+2 ab-IgM, Urine cytology ancillary only  Rash/Potential shingles S: saw dermatology this morning and was told she had herpes on her buttocks for about 2 weeks. Seems it was implied potentially genital herpes. Patient hsa been married in monogamous relationship for over 20 years and this was a huge surprise and psychological stress for her. Husband had similar lesions x3 on right lower abdomen a few weeks ago. She has spots on her right left buttocks only- no lesions near anus or on labia. She has a mild burning in these areas that she rates as mild to moderate pain. She has shingles years ago on upper  extremity and felt very similar. She has never had the shingles vaccination. She was prescribed valtrex by dermatology.  A/P: GIven S2 nerve distribution only I heavily lean toward shingles. We will update std screening as well to help alleviate patient concerns about marital unfaithfulness (which she had no concerns of prior to today). If igm for herpes positive would be more suggestive of acute herpetic infection though we discussed this would not have to be sexually transmitted. I told her valtrex is reasonable for both shingles and herpes and she could take this course- though less likely to help shingles as outside 72 hour and not having new lesions at this point.   Extended counseling was required due to psychological strain from this information. The duration of face-to-face time during this visit was greater than 25 minutes. Greater than 50% of this time was spent in counseling, explanation of diagnosis, planning of further management, and/or coordination of care. .   Orders Placed This Encounter  Procedures  . HIV antibody    [facility]    Standing Status:   Future    Number of Occurrences:   1    Standing Expiration Date:   10/25/2017  . RPR    [facility]    Standing Status:   Future    Number of Occurrences:   1    Standing Expiration Date:   10/25/2017  . HSV(herpes simplex vrs) 1+2 ab-IgG    Standing Status:   Future    Number of Occurrences:   1    Standing Expiration Date:   10/25/2017  . HSV(herpes simplex vrs) 1+2 ab-IgM    Standing Status:   Future    Number of Occurrences:   1    Standing Expiration Date:   10/25/2017   Return precautions advised.  [name], MD

## 2016-10-25 NOTE — Patient Instructions (Addendum)
Please stop by lab before you go Urine as well- do not clean before you give sample and give only small amount Should have most labs back within 2 weeks  I lean toward shingles given the s2 nerve distribution of the rash. Valtrex helps shingles as well so it is reasonable to take this.   Bloodwork should give us more information on potential herpes infection- once again I think this is the less likely of the 2.

## 2016-10-25 NOTE — Progress Notes (Addendum)
Subjective:    Patient ID: Shannon Mckenzie, female    DOB: 1960-12-02    MRN: [id number]    Brief patient profile:   55 yowf  quit smoking 1993 no resp problems but within a year started having a cough sev times a year treated with abx and sometimes prednisone but never persistent  at all then  at work around 2007 p ? Exposure > dx as copd and treated with symbicort which seemed to help  until   2013 while on ACEi and tried different inhalers didn't work and went back to [employer] and then developed cough around summer 2014 and eval by Shannon Mckenzie for Sob > neg > referred to pulmonary clinic 03/19/2013 with evidence of completely reversible airflow obst by pfts 03/2013.    History of Present Illness  03/19/2013 1st [facility] office visit/ Wert cc daily x 2 months x cough > yellow mucus off and on all day assoc with  Some nasal congestion worse at hs but does not wake her up with doe x steps and much better p ventolin. Typically does ok on flat surfaces. rec Doxycycline 100 mg twice daily before you eat with glass of water x 10 days > if mucus is not clear you need a sinus ct Shannon Mckenzie at [phone]) Continue symbicort but Work on inhaler technique:  Mucinex dm up 1200mg  every  12 hours as needed cough - if not effective fill the rx for tramadol to take up to every 4 hours Nexium 40 mg  Take 30- 60 min before your first and last meals of the day  GERD diet    04/16/2013 f/u ov/Wert re: asthma Chief Complaint  Patient presents with  . Follow-up    Pt states that her breathing has improved since the last visit. Cough has improved- no longer coughing during ther day, but still bothers her at night.    no sign limiting sob, no need for saba, still some green mucus in ams rec omnicef 300 twice daily x 14 days and call if not 100% better Ok to use symbicort 160 Take 2 puffs first thing in am and then another 2 puffs about 12 hours later.  05/19/2013 f/u ov/Wert re: cough flare Chief Complaint   Patient presents with  . Follow-up    Pt c/o worsening cough- prod with green sputum-currently on round of cefdinir. She states breathing is unchanged since last visit. Has not used rescue inhaler but has used neb x 1 since last visit  cough worse w/in a week of stopping the abx (on the 7th ) = Nov 15th started back coughing and started back on omnicef 11/ 21 x 14 days.  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 No increased sob. rec Add singulair  Take mucinex dm up to 1200 mg every  12 hours and supplement if needed with  tramadol 50 mg up to 2 every 4 hours to suppress the urge to cough.  Once you have eliminated the cough for 3 straight days try reducing the tramadol first,  then the mucinex dm  Prednisone 10 mg take  4 each am x 2 days,   2 each am x 2 days,  1 each am x 2 days and stop    06/14/2013 acute ov/Wert re: cough on maint rx with symbicort/ singulair/ nasacort Chief Complaint  Patient presents with  . Acute Visit    Pt c/o PND, sneezing and cough for the past  10 days.   no sob over baseline nor need for saba. Cough min prod mucoid, esp in ams rec Please see patient coordinator before you leave today  to schedule ENT evaluation>  Shannon Mckenzie eval neg > f/u prn  Remember that nasal steroids have no immediate benefit  If worsen cough in meantime ok to start omnicef 300 mg twice daily x 14 days     10/25/2016   Seen as new pt ov/Wert re re-establish re asthma control on symb 160 2bid, singulair / flonase Chief Complaint  Patient presents with  . Follow-up    Pt last seen in 2014. She states she had PNA recently. She has improved and back to baseline now.   since last ov dx with pna "quite a few times" always w/o cxr and rx with abx/ pred  but gets better few several months then "sick again" "feeling fine" on nexium 40 mg with bfast and hs but admits is not c/w using it and also taking lots of fish oil "read good things about  it" Previous w/u c/w cat/dog allergy no longer applicable x 2 years no pets and no obvious triggers now  Not limited by breathing from desired activities unless having a flare of cough.     No obvious patterns to  day to day or daytime variability or present  excess/ purulent sputum or mucus plugs or hemoptysis or cp or chest tightness, subjective wheeze or overt sinus or hb symptoms. No unusual exp hx or h/o childhood pna/ asthma or knowledge of premature birth.  Sleeping ok without nocturnal  or early am exacerbation  of respiratory  c/o's or need for noct saba. Also denies any obvious fluctuation of symptoms with weather or environmental changes or other aggravating or alleviating factors except as outlined above   Current Medications, Allergies, Complete Past Medical History, Past Surgical History, Family History, and Social History were reviewed in [facility] electronic medical record.  ROS  The following are not active complaints unless bolded sore throat, dysphagia, dental problems, itching, sneezing,  nasal congestion or excess/ purulent secretions, ear ache,   fever, chills, sweats, unintended wt loss, classically pleuritic or exertional cp,  orthopnea pnd or leg swelling, presyncope, palpitations, abdominal pain, anorexia, nausea, vomiting, diarrhea  or change in bowel or bladder habits, change in stools or urine, dysuria,hematuria,  rash, arthralgias, visual complaints, headache, numbness, weakness or ataxia or problems with walking or coordination,  change in mood/affect or memory.                                   Objective:   Physical Exam  amb wf nad    04/16/13          179  > 05/19/2013  180 > 06/14/2013 180 > 10/25/2016   178  Wt Readings from Last 3 Encounters:  03/19/13 187 lb (84.823 kg)  03/02/13 177 lb (80.287 kg)  10/06/12 184 lb (83.462 kg)     HEENT: nl dentition,  and orophanx. Nl external ear canals without cough reflex Mod non-specific  turbinate edema bilat   NECK :  without JVD/Nodes/TM/ nl carotid upstrokes bilaterally   LUNGS: no acc muscle use, clear to A and P bilaterally with  cough on insp, not exp   CV:  RRR  no s3 or murmur or increase in P2, no edema   ABD:  soft and nontender with nl excursion in the  supine position. No bruits or organomegaly, bowel sounds nl  MS:  warm without deformities, calf tenderness, cyanosis or clubbing        I personally reviewed images and agree with radiology impression as follows:  CXR:   10/01/16 There is no pneumonia nor other acute cardiopulmonary abnormality.         Assessment & Plan:

## 2016-10-25 NOTE — Patient Instructions (Addendum)
No more fish oil   Stop flovent and continue symbicort 160 2 every 12 hours   nexium 40 mg Take 30- 60 min before your first and last meals of the day  Or Pepcid 20 mg at bedtime    GERD (REFLUX)  is an extremely common cause of respiratory symptoms just like yours , many times with no obvious heartburn at all.    It can be treated with medication, but also with lifestyle changes including elevation of the head of your bed (ideally with 6 inch  bed blocks),  Smoking cessation, avoidance of late meals, excessive alcohol, and avoid fatty foods, chocolate, peppermint, colas, red wine, and acidic juices such as orange juice.  NO MINT OR MENTHOL PRODUCTS SO NO COUGH DROPS   USE SUGARLESS CANDY INSTEAD (Jolley ranchers or Stover's or Life Savers) or even ice chips will also do - the key is to swallow to prevent all throat clearing. NO OIL BASED VITAMINS - use powdered substitutes.  Please see patient coordinator before you leave today  to schedule sinus CT  Please remember to go to the lab   department downstairs in the basement  for your tests - we will call you with the results when they are available.  Please schedule a follow up office visit in 4 weeks, sooner if needed

## 2016-10-25 NOTE — Progress Notes (Signed)
Pre visit review using our clinic review tool, if applicable. No additional management support is needed unless otherwise documented below in the visit note. 

## 2016-10-26 ENCOUNTER — Encounter: Payer: Self-pay | Admitting: Internal Medicine

## 2016-10-26 LAB — HIV ANTIBODY (ROUTINE TESTING W REFLEX): HIV 1&2 Ab, 4th Generation: NONREACTIVE

## 2016-10-26 LAB — RPR

## 2016-10-26 NOTE — Assessment & Plan Note (Addendum)
-   PFT's 04/16/13 airflow normalizes p B2 (a 15% improvement from baseline) with nl DLCO  - added singulair 05/19/2013  -  10/25/2016  After extensive coaching HFA effectiveness =    75% (Ti too short )   See uacs

## 2016-10-26 NOTE — Assessment & Plan Note (Addendum)
-   ACEi d/c around 03/03/13  - Sinus CT 04/16/2013 >  Prominent opacification maxillary sinuses bilaterally with air-fluid  level on the right suggesting acute sinusitis.  > repeat 06/01/2013 Marked improvement in bilateral maxillary sinus disease with onlyslight residual dependent fluid on the left. Marked improvement bilateral frontal sinus disease with only slight residual fluid or mucosal thickening on the right. - Allergy profile 04/16/2013 > Eos 14.4% and IgE= 74 >  Cat dander > dog > grass - added singulair 05/19/2013  - flared again 06/14/13 > referred to ENT > Jenne PaneBates eval neg > f/u prn  - Allergy profile 10/25/2016 >  Eos 0. /  IgE   - Sinus CT  10/25/2016 >>>   Not really clear how much of this is uacs vs asthma but clearly has poorly controlled airways dz.  DDX of  difficult airways management almost all start with A and  include Adherence, Ace Inhibitors, Acid Reflux, Active Sinus Disease, Alpha 1 Antitripsin deficiency, Anxiety masquerading as Airways dz,  ABPA,  Allergy(esp in young), Aspiration (esp in elderly), Adverse effects of meds,  Active smokers, A bunch of PE's (a small clot burden can't cause this syndrome unless there is already severe underlying pulm or vascular dz with poor reserve) plus two Bs  = Bronchiectasis and Beta blocker use..and one C= CHF   Adherence is always the initial "prime suspect" and is a multilayered concern that requires a "trust but verify" approach in every patient - starting with knowing how to use medications, especially inhalers, correctly, keeping up with refills and understanding the fundamental difference between maintenance and prns vs those medications only taken for a very short course and then stopped and not refilled.  - see hfa teaching under asthma   ? Acid (or non-acid) GERD > always difficult to exclude as up to 75% of pts in some series report no assoc GI/ Heartburn symptoms> rec continue max (24h)  acid suppression and diet restrictions/  reviewed   - NO FISH OIL  ? Active sinus dz > repeat now that she's "between flares"   ? Allergy > repeat profile now that no longer has indoor pet/ continue singulair for now/ no need for flovent and symb 160 together so just try symb 160 for now   ? Bronchiectasis > consider HRCT  Total time devoted to counseling  > 50 % of initial 60 min office visit:  review case with pt/ discussion of options/alternatives/ personally creating written customized instructions  in presence of pt  then going over those specific  Instructions directly with the pt including how to use all of the meds but in particular covering each new medication in detail and the difference between the maintenance= "automatic" meds and the prns using an action plan format for the latter (If this problem/symptom => do that organization reading Left to right).  Please see AVS from this visit for a full list of these instructions which I personally wrote for this pt and  are unique to this visit.

## 2016-10-28 LAB — RESPIRATORY ALLERGY PROFILE REGION II ~~LOC~~
Allergen, A. alternata, m6: <0.1 kU/L
Allergen, Cedar tree, t12: <0.1 kU/L
Allergen, Comm Silver Birch, t9: <0.1 kU/L
Allergen, D pternoyssinus,d7: <0.1 kU/L
Allergen, Mouse Urine Protein, e78: <0.1 kU/L
Allergen, Oak,t7: <0.1 kU/L
Box Elder IgE: <0.1 kU/L
Cat Dander: 10.8 kU/L — ABNORMAL HIGH
D. farinae: <0.1 kU/L
Dog Dander: 0.27 kU/L — ABNORMAL HIGH
Elm IgE: <0.1 kU/L
IgE (Immunoglobulin E), Serum: 59 kU/L (ref <115)
Johnson Grass: <0.1 kU/L
Rough Pigweed  IgE: <0.1 kU/L
Sheep Sorrel IgE: <0.1 kU/L

## 2016-10-28 LAB — HSV(HERPES SIMPLEX VRS) I + II AB-IGG
HSV 1 GLYCOPROTEIN G AB, IGG: 32.4 {index} — AB (ref <0.90)
HSV 2 Glycoprotein G Ab, IgG: <0.9 Index (ref <0.90)

## 2016-10-29 LAB — URINE CYTOLOGY ANCILLARY ONLY
CHLAMYDIA, DNA PROBE: NEGATIVE
Neisseria Gonorrhea: NEGATIVE
Trichomonas: NEGATIVE

## 2016-10-29 NOTE — Progress Notes (Signed)
Spoke with pt and notified of results per Dr. Wert. Pt verbalized understanding and denied any questions. 

## 2016-11-01 ENCOUNTER — Ambulatory Visit (INDEPENDENT_AMBULATORY_CARE_PROVIDER_SITE_OTHER)
Admission: RE | Admit: 2016-11-01 | Discharge: 2016-11-01 | Disposition: A | Discharge status: Home / Self Care | Payer: BLUE CROSS/BLUE SHIELD | Source: Ambulatory Visit | Attending: Internal Medicine | Admitting: Internal Medicine

## 2016-11-01 DIAGNOSIS — R058 Other specified cough: Secondary | ICD-10-CM

## 2016-11-01 DIAGNOSIS — R05 Cough: Secondary | ICD-10-CM

## 2016-11-01 NOTE — Progress Notes (Signed)
LMTCB

## 2016-11-02 ENCOUNTER — Other Ambulatory Visit: Payer: Self-pay | Admitting: Family

## 2016-11-04 ENCOUNTER — Telehealth: Payer: Self-pay | Admitting: Internal Medicine

## 2016-11-04 NOTE — Telephone Encounter (Signed)
Patient returned phone call for CT results. Patient contact # [phone]..ert

## 2016-11-04 NOTE — Telephone Encounter (Signed)
Notes recorded by Nyoka CowdenWert, Michael B, MD on 11/01/2016 at 4:54 PM EDT Call patient : Study is unremarkable, no change in recs ---------------------- Spoke with pt, aware of results/recs.  Nothing further needed.

## 2016-11-06 ENCOUNTER — Other Ambulatory Visit: Payer: Self-pay | Admitting: Family

## 2016-11-11 ENCOUNTER — Telehealth: Payer: Self-pay | Admitting: Family

## 2016-11-11 NOTE — Telephone Encounter (Signed)
Continue to monitor for outbreaks and treat as needed. If flairs occur can start suppressive medication. Only transmittable when lesions are present.

## 2016-11-11 NOTE — Telephone Encounter (Signed)
Pt would like a call back regarding lab work done at [facility]. She does not understand the results. She viewed them on Mychart.  Please call back

## 2016-11-11 NOTE — Telephone Encounter (Signed)
° ° °  Pt call to say she does not understand her labs and would like a call back .   I did advise her that she could fup with her  pcp

## 2016-11-11 NOTE — Telephone Encounter (Signed)
Please advise if pt needs to come in to recheck her HSV lab work. Pt aware of results.

## 2016-11-12 NOTE — Telephone Encounter (Signed)
Patient already notified and questions answered.

## 2016-11-13 NOTE — Telephone Encounter (Signed)
Pt aware.

## 2016-11-22 ENCOUNTER — Ambulatory Visit: Payer: BLUE CROSS/BLUE SHIELD | Admitting: Internal Medicine

## 2016-11-22 ENCOUNTER — Telehealth: Payer: Self-pay | Admitting: Family Medicine

## 2016-11-22 NOTE — Telephone Encounter (Signed)
Spoke to pt - lab didn't run one of the tests we ordered on 5/4 - will come by one morning next week and let us get more labs.  Was very nice and understanding.

## 2016-11-22 NOTE — Addendum Note (Signed)
Addended by: Charna ElizabethLEMMONS, STEPHANIE L on: 11/22/2016 11:17 AM   Modules accepted: Orders

## 2016-11-26 DIAGNOSIS — G5603 Carpal tunnel syndrome, bilateral upper limbs: Secondary | ICD-10-CM | POA: Diagnosis not present

## 2016-11-27 ENCOUNTER — Other Ambulatory Visit: Payer: BLUE CROSS/BLUE SHIELD

## 2016-11-27 DIAGNOSIS — Z7251 High risk heterosexual behavior: Secondary | ICD-10-CM | POA: Diagnosis not present

## 2016-11-29 LAB — HSV(HERPES SIMPLEX VRS) I + II AB-IGM

## 2016-12-30 ENCOUNTER — Other Ambulatory Visit: Payer: Self-pay | Admitting: Family

## 2017-01-17 DIAGNOSIS — M150 Primary generalized (osteo)arthritis: Secondary | ICD-10-CM | POA: Diagnosis not present

## 2017-01-17 DIAGNOSIS — M2550 Pain in unspecified joint: Secondary | ICD-10-CM | POA: Diagnosis not present

## 2017-01-17 DIAGNOSIS — M0579 Rheumatoid arthritis with rheumatoid factor of multiple sites without organ or systems involvement: Secondary | ICD-10-CM | POA: Diagnosis not present

## 2017-01-17 LAB — HEPATIC FUNCTION PANEL
ALK PHOS: 83 (ref 25–125)
ALT: 14 (ref 7–35)
AST: 20 (ref 13–35)
Bilirubin, Total: 0.4

## 2017-01-17 LAB — CBC AND DIFFERENTIAL
HCT: 42 (ref 36–46)
HEMOGLOBIN: 14.1 (ref 12.0–16.0)
Neutrophils Absolute: 3
Platelets: 189 (ref 150–399)
WBC: 5.2

## 2017-01-17 LAB — BASIC METABOLIC PANEL
BUN: 20 (ref 4–21)
CREATININE: 0.7 (ref 0.5–1.1)
Glucose: 82
POTASSIUM: 4.1 (ref 3.4–5.3)
SODIUM: 143 (ref 137–147)

## 2017-01-30 DIAGNOSIS — Z01419 Encounter for gynecological examination (general) (routine) without abnormal findings: Secondary | ICD-10-CM | POA: Diagnosis not present

## 2017-01-30 DIAGNOSIS — Z6832 Body mass index (BMI) 32.0-32.9, adult: Secondary | ICD-10-CM | POA: Diagnosis not present

## 2017-01-30 DIAGNOSIS — Z1231 Encounter for screening mammogram for malignant neoplasm of breast: Secondary | ICD-10-CM | POA: Diagnosis not present

## 2017-01-30 DIAGNOSIS — Z780 Asymptomatic menopausal state: Secondary | ICD-10-CM | POA: Diagnosis not present

## 2017-02-01 ENCOUNTER — Other Ambulatory Visit: Payer: Self-pay | Admitting: Family

## 2017-02-11 ENCOUNTER — Ambulatory Visit: Payer: BLUE CROSS/BLUE SHIELD | Admitting: Family

## 2017-02-12 ENCOUNTER — Encounter: Payer: Self-pay | Admitting: Family

## 2017-02-12 ENCOUNTER — Ambulatory Visit (INDEPENDENT_AMBULATORY_CARE_PROVIDER_SITE_OTHER): Payer: BLUE CROSS/BLUE SHIELD | Admitting: Family

## 2017-02-12 VITALS — BP 132/88 | HR 71 | Temp 98.1°F | Resp 16 | Ht 62.0 in | Wt 176.0 lb

## 2017-02-12 DIAGNOSIS — R059 Cough, unspecified: Secondary | ICD-10-CM

## 2017-02-12 DIAGNOSIS — R05 Cough: Secondary | ICD-10-CM | POA: Diagnosis not present

## 2017-02-12 MED ORDER — PREDNISONE 20 MG PO TABS: ORAL_TABLET | ORAL | 0 refills | Status: DC

## 2017-02-12 NOTE — Patient Instructions (Addendum)
Thank you for choosing [facility].  SUMMARY AND INSTRUCTIONS:  Enjoy your cruise!  Symptoms may be related to uncontrolled reflux or possible asthma.   Trial Breo in place of Symbicort for 1 week.   Start prednisone taper.  If your symptoms continue may consider follow up with gastroenterology.  Medication:  Your prescription(s) have been submitted to your pharmacy or been printed and provided for you. Please take as directed and contact our office if you believe you are having problem(s) with the medication(s) or have any questions.  Follow up:  If your symptoms worsen or fail to improve, please contact our office for further instruction, or in case of emergency go directly to the emergency room at the closest medical facility.

## 2017-02-12 NOTE — Progress Notes (Signed)
Subjective:    Patient ID: Shannon Mckenzie, female    DOB: Jan 16, 1961, 56 y.o.   MRN: [id number]  Chief Complaint  Patient presents with  . Cough    cough and SOB, body aches, would like prednisone    HPI:  Shannon Mckenzie is a 56 y.o. female who  has a past medical history of Allergy; Anxiety; Asthma; Depression; DJD (degenerative joint disease) of hip; Former smoker; GERD (gastroesophageal reflux disease); Hiatal hernia; Hyperlipidemia; Hypertension; Kidney stone; Reactive airway disease; RLS (restless legs syndrome); Routine gynecological examination; SOB (shortness of breath) (9/14); Statin intolerance; and Vision problems. and presents today for an office visit.  Continues to experience the associated symptom of cough, shortness of breath and body aches that have been waxing and waning. Coughing primarily comes after she eats. Currently maintained on esomeprazole twice daily for GERD. She is followed by pulmonology. Meloxicam has helped with the body aches and feels like it is not doing anything anymore.   Allergies  Allergen Reactions  . Statins     Myalgias with all statins  . Amoxicillin Hives and Rash      Outpatient Medications Prior to Visit  Medication Sig Dispense Refill  . albuterol (VENTOLIN HFA) 108 (90 Base) MCG/ACT inhaler Inhale 2 puffs into the lungs every 6 (six) hours as needed for wheezing. 1 Inhaler 1  . Cholecalciferol (D3 HIGH POTENCY) 2000 units CAPS Take by mouth.    . Cyanocobalamin (B-12) 1000 MCG CAPS Take by mouth.    . esomeprazole (NEXIUM) 40 MG capsule Take 1 capsule (40 mg total) by mouth 2 (two) times daily. 180 capsule 3  . ezetimibe (ZETIA) 10 MG tablet TAKE ONE TABLET BY MOUTH ONCE DAILY 30 tablet 5  . ferrous fumarate (HEMOCYTE - 106 MG FE) 325 (106 FE) MG TABS tablet Take 1 tablet by mouth.    . fluticasone (FLONASE) 50 MCG/ACT nasal spray SHAKE LIQUID AND USE 2 SPRAYS IN EACH NOSTRIL EVERY DAY 16 g 2  . losartan (COZAAR) 50 MG tablet TAKE 1  TABLET BY MOUTH EVERY DAY 90 tablet 0  . Melatonin 5 MG TABS Take by mouth.    . meloxicam (MOBIC) 15 MG tablet Take 15 mg by mouth daily.    . montelukast (SINGULAIR) 10 MG tablet TAKE 1 TABLET(10 MG) BY MOUTH AT BEDTIME 30 tablet 5  . Multiple Vitamin (MULTIVITAMIN) tablet Take 1 tablet by mouth daily.    . sertraline (ZOLOFT) 100 MG tablet TAKE 1 AND 1/2 TABLETS BY MOUTH EVERY NIGHT AT BEDTIME 135 tablet 0  . SYMBICORT 160-4.5 MCG/ACT inhaler INHALE 2 PUFFS BY MOUTH TWICE DAILY 10.2 g 5  . TURMERIC CURCUMIN PO Take by mouth.     No facility-administered medications prior to visit.      Past Medical History:  Diagnosis Date  . Allergy   . Anxiety   . Asthma   . Depression   . DJD (degenerative joint disease) of hip   . Former smoker   . GERD (gastroesophageal reflux disease)   . Hiatal hernia   . Hyperlipidemia   . Hypertension   . Kidney stone   . Reactive airway disease   . RLS (restless legs syndrome)   . Routine gynecological examination    sees gynecology, [name] NP  . SOB (shortness of breath) 9/14   cardiac eval with stress test and echo; Dr. [name]  . Statin intolerance   . Vision problems    wears contact lenses  Review of Systems  Constitutional: Negative for chills and fever.  HENT: Negative for congestion.   Respiratory: Positive for cough, shortness of breath and wheezing. Negative for chest tightness and stridor.   Cardiovascular: Negative for chest pain, palpitations and leg swelling.      Objective:    BP 132/88 (BP Location: Left Arm, Patient Position: Sitting, Cuff Size: Normal)   Pulse 71   Temp 98.1 F (36.7 C) (Oral)   Resp 16   Ht 5\' 2"  (1.575 m)   Wt 176 lb (79.8 kg)   SpO2 95%   BMI 32.19 kg/m  Nursing note and vital signs reviewed.  Physical Exam  Constitutional: She is oriented to person, place, and time. She appears well-developed and well-nourished. No distress.  Cardiovascular: Normal rate, regular rhythm,  normal heart sounds and intact distal pulses.   Pulmonary/Chest: Effort normal. No respiratory distress. She has wheezes. She has no rales. She exhibits no tenderness.  Neurological: She is alert and oriented to person, place, and time.  Skin: Skin is warm and dry.  Psychiatric: She has a normal mood and affect. Her behavior is normal. Judgment and thought content normal.       Assessment & Plan:   Problem List Items Addressed This Visit      Other   Cough - Primary    Continues to experience cough and wheezing with differentials include asthma or GERD. Does not appear to be infectious. Continue current dosage of esomeprazole. Consider addition of sucralafate. Hold Symbicort and trial Breo. Continue current dosage of albuterol and montelukast. Start prednisone taper. Continue with scheduled follow up with Asthma and Allergy.           I have discontinued Ms. Camero's TURMERIC CURCUMIN PO. I am also having her start on predniSONE. Additionally, I am having her maintain her ferrous fumarate, esomeprazole, multivitamin, Cholecalciferol, albuterol, meloxicam, Melatonin, B-12, ezetimibe, SYMBICORT, montelukast, fluticasone, sertraline, and losartan.   Meds ordered this encounter  Medications  . predniSONE (DELTASONE) 20 MG tablet    Sig: Take 3 tablets by mouth daily for 3 days, then 2 tablets by mouth daily for 3 days then 1 tablet by mouth daily for 3 days.    Dispense:  18 tablet    Refill:  0    Order Specific Question:   Supervising Provider    Answer:   Hillard Danker A [[id number]]     Follow-up: Return in about 1 month (around 03/15/2017), or if symptoms worsen or fail to improve.  Jeanine Luz, FNP

## 2017-02-12 NOTE — Assessment & Plan Note (Signed)
Continues to experience cough and wheezing with differentials include asthma or GERD. Does not appear to be infectious. Continue current dosage of esomeprazole. Consider addition of sucralafate. Hold Symbicort and trial Breo. Continue current dosage of albuterol and montelukast. Start prednisone taper. Continue with scheduled follow up with Asthma and Allergy.

## 2017-02-18 DIAGNOSIS — M25551 Pain in right hip: Secondary | ICD-10-CM | POA: Diagnosis not present

## 2017-02-18 DIAGNOSIS — M5440 Lumbago with sciatica, unspecified side: Secondary | ICD-10-CM | POA: Diagnosis not present

## 2017-02-19 ENCOUNTER — Telehealth: Payer: Self-pay | Admitting: Family

## 2017-02-19 ENCOUNTER — Other Ambulatory Visit: Payer: Self-pay | Admitting: Family

## 2017-02-19 DIAGNOSIS — H578 Other specified disorders of eye and adnexa: Secondary | ICD-10-CM | POA: Diagnosis not present

## 2017-02-19 NOTE — Telephone Encounter (Signed)
Patient has been using the Breo samples for a about a week now. She believes it is working well. She would like a rx called in for it. Thank you.   [facility] to [facility] on file. Thank you.

## 2017-02-20 MED ORDER — FLUTICASONE FUROATE-VILANTEROL 100-25 MCG/INH IN AEPB: 1.0000 | INHALATION_SPRAY | Freq: Every day | RESPIRATORY_TRACT | 2 refills | Status: DC

## 2017-02-20 NOTE — Telephone Encounter (Signed)
Medication sent to pharmacy  

## 2017-03-10 DIAGNOSIS — J309 Allergic rhinitis, unspecified: Secondary | ICD-10-CM | POA: Diagnosis not present

## 2017-03-10 DIAGNOSIS — J3089 Other allergic rhinitis: Secondary | ICD-10-CM | POA: Diagnosis not present

## 2017-03-10 DIAGNOSIS — R05 Cough: Secondary | ICD-10-CM | POA: Diagnosis not present

## 2017-03-10 DIAGNOSIS — J3081 Allergic rhinitis due to animal (cat) (dog) hair and dander: Secondary | ICD-10-CM | POA: Diagnosis not present

## 2017-03-20 DIAGNOSIS — J3081 Allergic rhinitis due to animal (cat) (dog) hair and dander: Secondary | ICD-10-CM | POA: Diagnosis not present

## 2017-03-20 DIAGNOSIS — J3089 Other allergic rhinitis: Secondary | ICD-10-CM | POA: Diagnosis not present

## 2017-03-31 DIAGNOSIS — M25551 Pain in right hip: Secondary | ICD-10-CM | POA: Diagnosis not present

## 2017-03-31 DIAGNOSIS — M7062 Trochanteric bursitis, left hip: Secondary | ICD-10-CM | POA: Diagnosis not present

## 2017-03-31 DIAGNOSIS — Z6833 Body mass index (BMI) 33.0-33.9, adult: Secondary | ICD-10-CM | POA: Diagnosis not present

## 2017-03-31 DIAGNOSIS — M7061 Trochanteric bursitis, right hip: Secondary | ICD-10-CM | POA: Diagnosis not present

## 2017-04-02 ENCOUNTER — Other Ambulatory Visit: Payer: Self-pay | Admitting: Family

## 2017-04-08 DIAGNOSIS — M25551 Pain in right hip: Secondary | ICD-10-CM | POA: Diagnosis not present

## 2017-04-18 DIAGNOSIS — M25551 Pain in right hip: Secondary | ICD-10-CM | POA: Diagnosis not present

## 2017-04-25 ENCOUNTER — Other Ambulatory Visit: Payer: Self-pay | Admitting: *Deleted

## 2017-04-25 MED ORDER — EZETIMIBE 10 MG PO TABS: 10.0000 mg | ORAL_TABLET | Freq: Every day | ORAL | 0 refills | Status: DC

## 2017-04-25 MED ORDER — MONTELUKAST SODIUM 10 MG PO TABS: ORAL_TABLET | ORAL | 0 refills | Status: DC

## 2017-04-30 ENCOUNTER — Other Ambulatory Visit: Payer: Self-pay | Admitting: Orthopedic Surgery

## 2017-05-08 ENCOUNTER — Ambulatory Visit: Payer: BLUE CROSS/BLUE SHIELD | Admitting: Family Medicine

## 2017-05-08 ENCOUNTER — Encounter: Payer: Self-pay | Admitting: Family Medicine

## 2017-05-08 VITALS — BP 136/88 | HR 73 | Temp 98.1°F | Ht 62.0 in | Wt 182.4 lb

## 2017-05-08 DIAGNOSIS — F321 Major depressive disorder, single episode, moderate: Secondary | ICD-10-CM | POA: Diagnosis not present

## 2017-05-08 DIAGNOSIS — F325 Major depressive disorder, single episode, in full remission: Secondary | ICD-10-CM | POA: Insufficient documentation

## 2017-05-08 DIAGNOSIS — F411 Generalized anxiety disorder: Secondary | ICD-10-CM | POA: Diagnosis not present

## 2017-05-08 DIAGNOSIS — J4530 Mild persistent asthma, uncomplicated: Secondary | ICD-10-CM

## 2017-05-08 MED ORDER — ESCITALOPRAM OXALATE 20 MG PO TABS: 20.0000 mg | ORAL_TABLET | Freq: Every day | ORAL | 2 refills | Status: DC

## 2017-05-08 MED ORDER — FLUTICASONE FUROATE-VILANTEROL 100-25 MCG/INH IN AEPB: 1.0000 | INHALATION_SPRAY | Freq: Every day | RESPIRATORY_TRACT | 2 refills | Status: DC

## 2017-05-08 NOTE — Progress Notes (Signed)
Subjective:  Shannon SalonKathy Mckenzie is a 56 y.o. year old very pleasant female patient who presents for/with See problem oriented charting ROS- admits to crying spells, depressed mood. Also with anxiety. No SI. No chest pain or shrotness of breath   Past Medical History-  Patient Active Problem List   Diagnosis Date Noted  . Anemia 04/16/2013    Priority: Medium  . Extrinsic asthma, mild persistent, uncomplicated 03/20/2013    Priority: Medium  . Upper airway cough syndrome ? all related to sinus dz 03/19/2013    Priority: Medium  . Essential hypertension, benign 04/15/2012    Priority: Medium  . Hyperlipidemia 04/15/2012    Priority: Medium  . Iron deficiency 02/20/2016    Priority: Low  . Cough 03/13/2015    Priority: Low  . Sinusitis, chronic 06/14/2013    Priority: Low  . Obesity 04/15/2012    Priority: Low  . Depression, major, single episode, moderate (HCC) 05/08/2017  . GAD (generalized anxiety disorder) 05/08/2017  . Asthma exacerbation 10/04/2016  . Shortness of breath 01/17/2016  . Bronchitis 06/05/2015  . Osteoarthritis 03/13/2015  . Back pain 11/10/2014  . Rash and nonspecific skin eruption 07/21/2014  . Numbness and tingling 05/03/2014  . Cold feeling 05/03/2014    Medications- reviewed and updated Current Outpatient Medications  Medication Sig Dispense Refill  . albuterol (VENTOLIN HFA) 108 (90 Base) MCG/ACT inhaler Inhale 2 puffs into the lungs every 6 (six) hours as needed for wheezing. 1 Inhaler 1  . Cholecalciferol (D3 HIGH POTENCY) 2000 units CAPS Take by mouth.    . diclofenac (VOLTAREN) 75 MG EC tablet Take 1 tablet daily by mouth.  2  . esomeprazole (NEXIUM) 40 MG capsule Take 1 capsule (40 mg total) by mouth 2 (two) times daily. 180 capsule 3  . ezetimibe (ZETIA) 10 MG tablet Take 1 tablet (10 mg total) by mouth daily. Must schedule appt w/new provider for future refills 30 tablet 0  . ferrous fumarate (HEMOCYTE - 106 MG FE) 325 (106 FE) MG TABS tablet Take 1  tablet by mouth.    . fluticasone (FLONASE) 50 MCG/ACT nasal spray SHAKE LIQUID AND USE 2 SPRAYS IN EACH NOSTRIL EVERY DAY 16 g 2  . fluticasone furoate-vilanterol (BREO ELLIPTA) 100-25 MCG/INH AEPB Inhale 1 puff daily into the lungs. 60 each 2  . losartan (COZAAR) 50 MG tablet TAKE 1 TABLET BY MOUTH EVERY DAY 90 tablet 0  . Melatonin 5 MG TABS Take by mouth.    . montelukast (SINGULAIR) 10 MG tablet TAKE 1 TABLET(10 MG) BY MOUTH AT BEDTIME 30 tablet 0  . sertraline (ZOLOFT) 100 MG tablet TAKE 1 AND 1/2 TABLETS BY MOUTH EVERY NIGHT AT BEDTIME 135 tablet 0  . escitalopram (LEXAPRO) 20 MG tablet Take 1 tablet (20 mg total) daily by mouth. 30 tablet 2   No current facility-administered medications for this visit.     Objective: BP 136/88 (BP Location: Left Arm, Patient Position: Sitting, Cuff Size: Large)   Pulse 73   Temp 98.1 F (36.7 C) (Oral)   Ht 5\' 2"  (1.575 m)   Wt 182 lb 6.4 oz (82.7 kg)   SpO2 97%   BMI 33.36 kg/m  Gen: NAD, resting comfortably CV: RRR no murmurs rubs or gallops Lungs: CTAB no crackles, wheeze, rhonchi Abdomen: soft/nontender/nondistended/normal bowel sounds. Ext: no edema Skin: warm, dry  Assessment/Plan:  Depression, major, single episode, moderate (HCC) GAD S: 3 weeks ago started to feel very emotional even above her baseline. Feels that she  gets her feelings hurt easily. Easily agitated. Grandkids get on her nerves and she would typically enjoy seeing them. 55 year old son moved out a month ago- empty nest syndrome. Waking up around 3-4 AM and wakes up thinking. Started melatonin back yesterday- had a better night last night. No issue going to sleep. She takes zoloft 150mg  at baseline. Patient admits to history of depression but denies anxiety to this level. Has done counseling in the past and found helpful- she thinks prior counselor still available   Having more health issues- hip arthritis and that is wearing on her as well. 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  A/P: GAD7 of 14 and PHQ9 of 16 today. New diagnosis of GAD with worsening of prior depression. Will change from zoloft 150mg  (due to slightly higher chance of insomnia/agitation) to lexapro 20mg .Encouraged her to add counseling back in which she seems interested in- life changes like son leaving the home likely are big keys for her and figuring out next stages In her life. Follow up 2-3 weeks to reassess and make sure tolerating the change then physical in January which would give at least 6 weeks for medication to work  GAD (generalized anxiety disorder) See depression section from today  Extrinsic asthma, mild persistent, uncomplicated Breo helpful when using- ran out 2 days ago- no flare but will restart this to make sure no worsening symptoms- not having to use albuterol- on symbicort was having to use semi regularly.   Future Appointments  Date Time Provider Department Center  05/26/2017 10:00 AM Shelva MajesticHunter, Salisha Bardsley O, MD [facility] None  07/14/2017  8:45 AM Durene CalHunter, Aldine ContesStephen O, MD [facility] None   Meds ordered this encounter  Medications  . diclofenac (VOLTAREN) 75 MG EC tablet    Sig: Take 1 tablet daily by mouth.    Refill:  2  . escitalopram (LEXAPRO) 20 MG tablet    Sig: Take 1 tablet (20 mg total) daily by mouth.    Dispense:  30 tablet    Refill:  2  . fluticasone furoate-vilanterol (BREO ELLIPTA) 100-25 MCG/INH AEPB    Sig: Inhale 1 puff daily into the lungs.    Dispense:  60 each    Refill:  2    Patient lost last inhaler- out for 2 days- needs refills    Return precautions advised.  Tana ConchStephen Faria Casella, MD

## 2017-05-08 NOTE — Assessment & Plan Note (Signed)
Breo helpful when using- ran out 2 days ago- no flare but will restart this to make sure no worsening symptoms- not having to use albuterol- on symbicort was having to use semi regularly.

## 2017-05-08 NOTE — Patient Instructions (Addendum)
Please schedule visit Monday December 3rd to establish care- for front desk please use 10 and 10 15 slot for this.   If it works for you do January 18th at 8 45 for physical. Physical has to be 07/05/16 or later.   Stop zoloft- last dose last night. Start lexapro/escitalopram 20mg  tonight.   Also either call your prior counselor or One of the [facility] folks- either are great options just see which one insurance likes better  Taking the medicine as directed and not missing any doses is one of the best things you can do to treat your depression and anxiety.  Here are some things to keep in mind:  1) Side effects (stomach upset, some increased anxiety) may happen before you notice a benefit.  These side effects typically go away over time. 2) Changes to your dose of medicine or a change in medication all together is sometimes necessary 3) Most people need to be on medication at least 6-12 months. You will likely be on this long term.  4) Many people will notice an improvement within two weeks but the full effect of the medication can take up to 4-6 weeks 5) Stopping the medication when you start feeling better often results in a return of symptoms 6) If you start having thoughts of hurting yourself or others after starting this medicine, call our office immediately at [phone] or seek care through 911.

## 2017-05-08 NOTE — Assessment & Plan Note (Signed)
GAD S: 3 weeks ago started to feel very emotional even above her baseline. Feels that she gets her feelings hurt easily. Easily agitated. Grandkids get on her nerves and she would typically enjoy seeing them. 55 year old son moved out a month ago- empty nest syndrome. Waking up around 3-4 AM and wakes up thinking. Started melatonin back yesterday- had a better night last night. No issue going to sleep. She takes zoloft 150mg  at baseline. Patient admits to history of depression but denies anxiety to this level. Has done counseling in the past and found helpful- she thinks prior counselor still available   Having more health issues- hip arthritis and that is wearing on her as well. 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  A/P: GAD7 of 14 and PHQ9 of 16 today. New diagnosis of GAD with worsening of prior depression. Will change from zoloft 150mg  (due to slightly higher chance of insomnia/agitation) to lexapro 20mg .Encouraged her to add counseling back in which she seems interested in- life changes like son leaving the home likely are big keys for her and figuring out next stages In her life. Follow up 2-3 weeks to reassess and make sure tolerating the change then physical in January which would give at least 6 weeks for medication to work

## 2017-05-08 NOTE — Assessment & Plan Note (Signed)
See depression section from today

## 2017-05-13 ENCOUNTER — Telehealth: Payer: Self-pay | Admitting: *Deleted

## 2017-05-13 DIAGNOSIS — M25552 Pain in left hip: Secondary | ICD-10-CM | POA: Diagnosis not present

## 2017-05-13 DIAGNOSIS — M25562 Pain in left knee: Secondary | ICD-10-CM | POA: Diagnosis not present

## 2017-05-13 DIAGNOSIS — M25551 Pain in right hip: Secondary | ICD-10-CM | POA: Diagnosis not present

## 2017-05-13 DIAGNOSIS — M25561 Pain in right knee: Secondary | ICD-10-CM | POA: Diagnosis not present

## 2017-05-13 NOTE — Telephone Encounter (Signed)
Yes thanks may fill 

## 2017-05-13 NOTE — Telephone Encounter (Signed)
Rec'd fax pt requesting refill on her Losartan 50 mg take 1 by mouth daily # 90. Last filled 02/03/17. Pt new PCP is Dr. Durene CalHunter @ [facility] forwarding request to them.Marland Kitchen.Raechel Chute/lmb

## 2017-05-14 ENCOUNTER — Other Ambulatory Visit: Payer: Self-pay

## 2017-05-14 MED ORDER — LOSARTAN POTASSIUM 50 MG PO TABS: 50.0000 mg | ORAL_TABLET | Freq: Every day | ORAL | 1 refills | Status: DC

## 2017-05-14 NOTE — Telephone Encounter (Signed)
Prescription sent to pharmacy as requested.

## 2017-05-26 ENCOUNTER — Encounter: Payer: Self-pay | Admitting: Family Medicine

## 2017-05-26 ENCOUNTER — Ambulatory Visit: Payer: BLUE CROSS/BLUE SHIELD | Admitting: Family Medicine

## 2017-05-26 VITALS — BP 122/66 | HR 93 | Temp 97.3°F | Ht 63.0 in | Wt 185.0 lb

## 2017-05-26 DIAGNOSIS — J309 Allergic rhinitis, unspecified: Secondary | ICD-10-CM | POA: Insufficient documentation

## 2017-05-26 DIAGNOSIS — Z87891 Personal history of nicotine dependence: Secondary | ICD-10-CM | POA: Insufficient documentation

## 2017-05-26 DIAGNOSIS — I10 Essential (primary) hypertension: Secondary | ICD-10-CM | POA: Diagnosis not present

## 2017-05-26 DIAGNOSIS — F321 Major depressive disorder, single episode, moderate: Secondary | ICD-10-CM | POA: Diagnosis not present

## 2017-05-26 DIAGNOSIS — G5600 Carpal tunnel syndrome, unspecified upper limb: Secondary | ICD-10-CM | POA: Insufficient documentation

## 2017-05-26 DIAGNOSIS — J4530 Mild persistent asthma, uncomplicated: Secondary | ICD-10-CM

## 2017-05-26 DIAGNOSIS — E785 Hyperlipidemia, unspecified: Secondary | ICD-10-CM

## 2017-05-26 DIAGNOSIS — F411 Generalized anxiety disorder: Secondary | ICD-10-CM

## 2017-05-26 MED ORDER — PREDNISONE 20 MG PO TABS: ORAL_TABLET | ORAL | 0 refills | Status: DC

## 2017-05-26 NOTE — Assessment & Plan Note (Signed)
GAD S: last visit we changed from zoloft 150mg  due to potential for insomnia/agitation to lexapro 20mg  and encouraged counseling with GAD7 of 14 and PHQ9 of 16. Has only been 2 weeks since the change. Planned follow up in January for CPE. AM HAs- tylenol resolves A/P: PHQ9 today to 6 down from 16 and GAD7 down to 6 from 14. Slight HA each AM but improving so will continue and repeat at physical. Has talked with counselor twice but working on getting set up for visit

## 2017-05-26 NOTE — Assessment & Plan Note (Signed)
S: poorly controlled on last check- now on zetia 10mg . No myalgias.  Lab Results  Component Value Date   CHOL 252 (H) 07/05/2016   HDL 59.50 07/05/2016   LDLCALC 182 (H) 07/05/2016   TRIG 53.0 07/05/2016   CHOLHDL 4 07/05/2016   A/P: she states has allergy on statins- has never tried once a week. Will et lipids at physical- if elevated consider once a week trial rosuvastatin 10mg 

## 2017-05-26 NOTE — Assessment & Plan Note (Signed)
Controlled on  Losartan 50mg  alone

## 2017-05-26 NOTE — Assessment & Plan Note (Signed)
-   See depression section.

## 2017-05-26 NOTE — Patient Instructions (Addendum)
Glad medicine seems to be helping! Please continue this  Hopefully prednisone will calm down the asthma- if it does not please return to see Dr. Sherene SiresWert. For new or worsening symptoms see us or him back sooner  See you in January unless you need us before then.

## 2017-05-26 NOTE — Progress Notes (Signed)
Phone: [phone]  Subjective:  Patient presents today to establish care with me as their new primary care provider. Patient was formerly a patient of Marcos EkeGreg Calone with [facility]. Chief complaint-noted.   See problem oriented charting ROS- admits to some wheezing, shortness of breath, chest tightness- typical of her asthma. Diffuse joint aches per baseline.   The following were reviewed and entered/updated in epic: Past Medical History:  Diagnosis Date  . Allergy   . Anxiety   . Asthma   . Depression   . DJD (degenerative joint disease) of hip   . Former smoker   . GERD (gastroesophageal reflux disease)   . Hiatal hernia   . Hyperlipidemia   . Hypertension   . Kidney stone   . Reactive airway disease   . RLS (restless legs syndrome)   . Routine gynecological examination    sees gynecology, [name] NP  . SOB (shortness of breath) 9/14   cardiac eval with stress test and echo; Dr. Viann FishSpencer Tilley  . Statin intolerance   . Vision problems    wears contact lenses   Patient Active Problem List   Diagnosis Date Noted  . Depression, major, single episode, moderate (HCC) 05/08/2017    Priority: High  . Carpal tunnel syndrome 05/26/2017    Priority: Medium  . Former smoker 05/26/2017    Priority: Medium  . GAD (generalized anxiety disorder) 05/08/2017    Priority: Medium  . Osteoarthritis 03/13/2015    Priority: Medium  . Anemia 04/16/2013    Priority: Medium  . Extrinsic asthma, mild persistent, uncomplicated 03/20/2013    Priority: Medium  . Upper airway cough syndrome ? all related to sinus dz 03/19/2013    Priority: Medium  . Essential hypertension, benign 04/15/2012    Priority: Medium  . Hyperlipidemia 04/15/2012    Priority: Medium  . Allergic rhinitis 05/26/2017    Priority: Low  . Iron deficiency 02/20/2016    Priority: Low  . Cough 03/13/2015    Priority: Low  . Back pain 11/10/2014    Priority: Low  . Rash and nonspecific skin eruption 07/21/2014   Priority: Low  . Cold feeling 05/03/2014    Priority: Low  . Sinusitis, chronic 06/14/2013    Priority: Low  . Obesity 04/15/2012    Priority: Low   Past Surgical History:  Procedure Laterality Date  . COLONOSCOPY  06/2012   Dr. Ramon DredgeEdward; repeat 10 years  . UPPER GI ENDOSCOPY N/A 07/24/2012   [place] Hernia,normal stomach, normal examined duodenum    Family History  Problem Relation Age of Onset  . Arthritis Mother   . Asthma Mother   . Diabetes Mother   . Cirrhosis Mother        died of liver nonalcoholic cirrhosis  . COPD Mother   . Heart disease Mother        CHF  . Asthma Sister        doesnt know history well  . Drug abuse Sister   . Cancer Father        died of pancreatic cancer at age 55  . Depression Brother   . Alcohol abuse Brother   . Stroke Maternal Aunt        died age 55yo of CVA  . Other Brother        died of MVA  . Other Sister   . Drug abuse Sister        doesnt know history well    Medications- reviewed and updated Current  Outpatient Medications  Medication Sig Dispense Refill  . albuterol (VENTOLIN HFA) 108 (90 Base) MCG/ACT inhaler Inhale 2 puffs into the lungs every 6 (six) hours as needed for wheezing. 1 Inhaler 1  . Cholecalciferol (D3 HIGH POTENCY) 2000 units CAPS Take by mouth.    . diclofenac (VOLTAREN) 75 MG EC tablet Take 1 tablet daily by mouth.  2  . escitalopram (LEXAPRO) 20 MG tablet Take 1 tablet (20 mg total) daily by mouth. 30 tablet 2  . esomeprazole (NEXIUM) 40 MG capsule Take 1 capsule (40 mg total) by mouth 2 (two) times daily. 180 capsule 3  . ezetimibe (ZETIA) 10 MG tablet Take 1 tablet (10 mg total) by mouth daily. Must schedule appt w/new provider for future refills 30 tablet 0  . ferrous fumarate (HEMOCYTE - 106 MG FE) 325 (106 FE) MG TABS tablet Take 1 tablet by mouth.    . fluticasone (FLONASE) 50 MCG/ACT nasal spray SHAKE LIQUID AND USE 2 SPRAYS IN EACH NOSTRIL EVERY DAY 16 g 2  . fluticasone furoate-vilanterol (BREO  ELLIPTA) 100-25 MCG/INH AEPB Inhale 1 puff daily into the lungs. 60 each 2  . losartan (COZAAR) 50 MG tablet Take 1 tablet (50 mg total) by mouth daily. 90 tablet 1  . Melatonin 5 MG TABS Take by mouth.    . montelukast (SINGULAIR) 10 MG tablet TAKE 1 TABLET(10 MG) BY MOUTH AT BEDTIME 30 tablet 0   Allergies-reviewed and updated Allergies  Allergen Reactions  . Statins     Myalgias with all statins  . Amoxicillin Hives and Rash    Social History   Socioeconomic History  . Marital status: Married    Spouse name: None  . Number of children: 1  . Years of education: 629  . Highest education level: None  Social Needs  . Financial resource strain: None  . Food insecurity - worry: None  . Food insecurity - inability: None  . Transportation needs - medical: None  . Transportation needs - non-medical: None  Occupational History  . Occupation: Equities traderHousekeeping    Employer: UNEMPLOYED  Tobacco Use  . Smoking status: Former Smoker    Packs/day: 1.00    Years: 15.00    Pack years: 15.00    Types: Cigarettes    Last attempt to quit: 06/25/1991    Years since quitting: 25.9  . Smokeless tobacco: Never Used  Substance and Sexual Activity  . Alcohol use: No    Alcohol/week: 0.0 oz  . Drug use: No  . Sexual activity: Not Currently  Other Topics Concern  . None  Social History Narrative   Married, 1 biological son, 1 adopted son.        Faith: Christian. Denies any religious beliefs that would effect health care.       Cleans homes for a living.       Fun: Watch movies, be outside and work in the yard, photography    Objective: BP 122/66 (BP Location: Left Arm, Patient Position: Sitting, Cuff Size: Large)   Pulse 93   Temp (!) 97.3 F (36.3 C) (Oral)   Ht 5\' 3"  (1.6 m)   Wt 185 lb (83.9 kg)   SpO2 96%   BMI 32.77 kg/m  Gen: NAD, resting comfortably HEENT: Mucous membranes are moist. Pharynx mild erythema and same with nasal tubrinates Neck: no thyromegaly CV: RRR no murmurs  rubs or gallops Lungs: nonlabored breathing. At this moment- CTAB no crackles, wheeze, rhonchi Abdomen: soft/nontender/nondistended/normal bowel sounds. No rebound or  guarding.  Ext: no edema Skin: warm, dry  Assessment/Plan:  Essential hypertension, benign Controlled on  Losartan 50mg  alone  Depression, major, single episode, moderate (HCC) GAD S: last visit we changed from zoloft 150mg  due to potential for insomnia/agitation to lexapro 20mg  and encouraged counseling with GAD7 of 14 and PHQ9 of 16. Has only been 2 weeks since the change. Planned follow up in January for CPE. AM HAs- tylenol resolves A/P: PHQ9 today to 6 down from 16 and GAD7 down to 6 from 14. Slight HA each AM but improving so will continue and repeat at physical. Has talked with counselor twice but working on getting set up for visit  GAD (generalized anxiety disorder) See depression section  Extrinsic asthma, mild persistent, uncomplicated S: had been out of breo for 2 days at last visit. We restarted with hopes to avoid flares. 2 days of wheezing more in last 2 days, had to use albuterol at least twice daily, mild chest tightness with this.  A/P: continue current medications. Short prednisone burst. Also helps her joints.   Hyperlipidemia S: poorly controlled on last check- now on zetia 10mg . No myalgias.  Lab Results  Component Value Date   CHOL 252 (H) 07/05/2016   HDL 59.50 07/05/2016   LDLCALC 182 (H) 07/05/2016   TRIG 53.0 07/05/2016   CHOLHDL 4 07/05/2016   A/P: she states has allergy on statins- has never tried once a week. Will et lipids at physical- if elevated consider once a week trial rosuvastatin 10mg    Future Appointments  Date Time Provider Department Center  07/14/2017  8:45 AM [name], MD [facility]   Meds ordered this encounter  Medications  . predniSONE (DELTASONE) 20 MG tablet    Sig: Take 1 tablet by mouth daily for 5 days, then 1/2 tablet daily for 2 days    Dispense:   6 tablet    Refill:  0   Return precautions advised.  [name], MD

## 2017-05-26 NOTE — Assessment & Plan Note (Addendum)
S: had been out of breo for 2 days at last visit. We restarted with hopes to avoid flares. 2 days of wheezing more in last 2 days, had to use albuterol at least twice daily, mild chest tightness with this.  A/P: continue current medications. Short prednisone burst. Also helps her joints.

## 2017-05-27 ENCOUNTER — Telehealth: Payer: Self-pay | Admitting: Family Medicine

## 2017-05-27 DIAGNOSIS — J320 Chronic maxillary sinusitis: Secondary | ICD-10-CM | POA: Diagnosis not present

## 2017-05-27 DIAGNOSIS — J322 Chronic ethmoidal sinusitis: Secondary | ICD-10-CM | POA: Diagnosis not present

## 2017-05-27 DIAGNOSIS — J040 Acute laryngitis: Secondary | ICD-10-CM | POA: Diagnosis not present

## 2017-05-27 DIAGNOSIS — J321 Chronic frontal sinusitis: Secondary | ICD-10-CM | POA: Diagnosis not present

## 2017-05-27 NOTE — Telephone Encounter (Signed)
Please see CRM note created below. Please advise.

## 2017-05-27 NOTE — Telephone Encounter (Signed)
Copied from CRM [id number]. Topic: Quick Communication - See Telephone Encounter >> May 27, 2017  1:52 PM Eston Mouldavis, Bedelia Pong B wrote: CRM for notification. See Telephone encounter for:   Pt asking about her dx from yesterdays office visit-   papers says she was seen for hypertension but she said she saw Dr for sinus infection  dr said she did not have sinus infection,  she saw another provider today who told her she has a major sinus infection  so she wants clarification  mostly for her records and insurance purposes.  Please call pt with clarity  [phone]  05/27/17.

## 2017-05-28 ENCOUNTER — Other Ambulatory Visit: Payer: Self-pay | Admitting: Internal Medicine

## 2017-05-28 NOTE — Telephone Encounter (Signed)
This was an establish care visit. I reviewed her chronic medical conditions- if she recalls I asked her if she was taking the losartan. I noted that blood pressure was well controlled.   I did not see evidence of sinus infection but was having some wheeze and placed on prednisone for presumed mild asthma flare.

## 2017-05-28 NOTE — Telephone Encounter (Signed)
Yes thanks, may fill 

## 2017-05-29 NOTE — Telephone Encounter (Signed)
Called patient and left a voicemail message for patient to return phone call.

## 2017-06-05 ENCOUNTER — Other Ambulatory Visit: Payer: Self-pay

## 2017-06-05 MED ORDER — FLUTICASONE PROPIONATE 50 MCG/ACT NA SUSP: NASAL | 2 refills | Status: DC

## 2017-06-06 DIAGNOSIS — J322 Chronic ethmoidal sinusitis: Secondary | ICD-10-CM | POA: Diagnosis not present

## 2017-06-06 DIAGNOSIS — J040 Acute laryngitis: Secondary | ICD-10-CM | POA: Diagnosis not present

## 2017-06-06 DIAGNOSIS — J320 Chronic maxillary sinusitis: Secondary | ICD-10-CM | POA: Diagnosis not present

## 2017-06-13 ENCOUNTER — Ambulatory Visit: Payer: BLUE CROSS/BLUE SHIELD | Admitting: Sports Medicine

## 2017-06-13 ENCOUNTER — Ambulatory Visit: Payer: Self-pay

## 2017-06-13 NOTE — Telephone Encounter (Signed)
Pt calling with c/o bilateral hip pain stating left hip hurts worse than right. Pt states pain is constant and is a level 9 out of 10. Pt taking Tylenol without relief. Describes pain as sharp. Appt made for today at 2:00 pm.   Reason for Disposition . [1] SEVERE pain (e.g., excruciating, unable to do any normal activities) AND [2] not improved after 2 hours of pain medicine  Answer Assessment - Initial Assessment Questions 1. LOCATION and RADIATION: "Where is the pain located?"      Bilateral hip pain  Left greater than right.  2. QUALITY: "What does the pain feel like?"  (e.g., sharp, dull, aching, burning)     Sharp pain left hip shooting through your hip 3. SEVERITY: "How bad is the pain?" "What does it keep you from doing?"   (Scale 1-10; or mild, moderate, severe)   -  MILD (1-3): doesn't interfere with normal activities    -  MODERATE (4-7): interferes with normal activities (e.g., work or school) or awakens from sleep, limping    -  SEVERE (8-10): excruciating pain, unable to do any normal activities, unable to walk     9 out of 10 4. ONSET: "When did the pain start?" "Does it come and go, or is it there all the time?"     This past Tuesday 5. WORK OR EXERCISE: "Has there been any recent work or exercise that involved this part of the body?"      Cleans houses  6. CAUSE: "What do you think is causing the hip pain?"      Arthritis was dx 2 years ago Had an injection to the right hip and it was helpful Seen by Dr Madelon Lipsaffrey 7. AGGRAVATING FACTORS: "What makes the hip pain worse?" (e.g., walking, climbing stairs, running)     Sitting, walking hurts all the time 8. OTHER SYMPTOMS: "Do you have any other symptoms?" (e.g., back pain, pain shooting down leg,  fever, rash)     no  Protocols used: HIP PAIN-A-AH

## 2017-06-27 ENCOUNTER — Encounter (HOSPITAL_BASED_OUTPATIENT_CLINIC_OR_DEPARTMENT_OTHER): Payer: Self-pay | Admitting: *Deleted

## 2017-06-27 ENCOUNTER — Other Ambulatory Visit: Payer: Self-pay

## 2017-07-01 ENCOUNTER — Encounter (HOSPITAL_BASED_OUTPATIENT_CLINIC_OR_DEPARTMENT_OTHER)
Admission: RE | Admit: 2017-07-01 | Discharge: 2017-07-01 | Disposition: A | Discharge status: Home / Self Care | Payer: BLUE CROSS/BLUE SHIELD | Source: Ambulatory Visit | Attending: Orthopedic Surgery | Admitting: Orthopedic Surgery

## 2017-07-01 DIAGNOSIS — M1990 Unspecified osteoarthritis, unspecified site: Secondary | ICD-10-CM | POA: Diagnosis not present

## 2017-07-01 DIAGNOSIS — Z888 Allergy status to other drugs, medicaments and biological substances status: Secondary | ICD-10-CM | POA: Diagnosis not present

## 2017-07-01 DIAGNOSIS — K219 Gastro-esophageal reflux disease without esophagitis: Secondary | ICD-10-CM | POA: Diagnosis not present

## 2017-07-01 DIAGNOSIS — G5602 Carpal tunnel syndrome, left upper limb: Secondary | ICD-10-CM | POA: Diagnosis not present

## 2017-07-01 DIAGNOSIS — F329 Major depressive disorder, single episode, unspecified: Secondary | ICD-10-CM | POA: Diagnosis not present

## 2017-07-01 DIAGNOSIS — I10 Essential (primary) hypertension: Secondary | ICD-10-CM | POA: Diagnosis not present

## 2017-07-01 DIAGNOSIS — G2581 Restless legs syndrome: Secondary | ICD-10-CM | POA: Diagnosis not present

## 2017-07-01 DIAGNOSIS — E785 Hyperlipidemia, unspecified: Secondary | ICD-10-CM | POA: Diagnosis not present

## 2017-07-01 DIAGNOSIS — F419 Anxiety disorder, unspecified: Secondary | ICD-10-CM | POA: Diagnosis not present

## 2017-07-01 DIAGNOSIS — Z6834 Body mass index (BMI) 34.0-34.9, adult: Secondary | ICD-10-CM | POA: Diagnosis not present

## 2017-07-01 DIAGNOSIS — Z881 Allergy status to other antibiotic agents status: Secondary | ICD-10-CM | POA: Diagnosis not present

## 2017-07-01 DIAGNOSIS — Z87891 Personal history of nicotine dependence: Secondary | ICD-10-CM | POA: Diagnosis not present

## 2017-07-01 DIAGNOSIS — Z79899 Other long term (current) drug therapy: Secondary | ICD-10-CM | POA: Diagnosis not present

## 2017-07-01 DIAGNOSIS — J449 Chronic obstructive pulmonary disease, unspecified: Secondary | ICD-10-CM | POA: Diagnosis not present

## 2017-07-03 ENCOUNTER — Encounter (HOSPITAL_BASED_OUTPATIENT_CLINIC_OR_DEPARTMENT_OTHER): Payer: Self-pay | Admitting: Anesthesiology

## 2017-07-03 ENCOUNTER — Encounter (HOSPITAL_BASED_OUTPATIENT_CLINIC_OR_DEPARTMENT_OTHER)
Admission: RE | Disposition: A | Discharge status: Home / Self Care | Payer: Self-pay | Source: Ambulatory Visit | Attending: Orthopedic Surgery

## 2017-07-03 ENCOUNTER — Ambulatory Visit (HOSPITAL_BASED_OUTPATIENT_CLINIC_OR_DEPARTMENT_OTHER): Payer: BLUE CROSS/BLUE SHIELD | Admitting: Anesthesiology

## 2017-07-03 ENCOUNTER — Other Ambulatory Visit: Payer: Self-pay

## 2017-07-03 ENCOUNTER — Ambulatory Visit (HOSPITAL_BASED_OUTPATIENT_CLINIC_OR_DEPARTMENT_OTHER)
Admission: RE | Admit: 2017-07-03 | Discharge: 2017-07-03 | Disposition: A | Discharge status: Home / Self Care | Payer: BLUE CROSS/BLUE SHIELD | Source: Ambulatory Visit | Attending: Orthopedic Surgery | Admitting: Orthopedic Surgery

## 2017-07-03 DIAGNOSIS — Z881 Allergy status to other antibiotic agents status: Secondary | ICD-10-CM | POA: Insufficient documentation

## 2017-07-03 DIAGNOSIS — F329 Major depressive disorder, single episode, unspecified: Secondary | ICD-10-CM | POA: Insufficient documentation

## 2017-07-03 DIAGNOSIS — G2581 Restless legs syndrome: Secondary | ICD-10-CM | POA: Diagnosis not present

## 2017-07-03 DIAGNOSIS — M1990 Unspecified osteoarthritis, unspecified site: Secondary | ICD-10-CM | POA: Insufficient documentation

## 2017-07-03 DIAGNOSIS — J449 Chronic obstructive pulmonary disease, unspecified: Secondary | ICD-10-CM | POA: Diagnosis not present

## 2017-07-03 DIAGNOSIS — G5602 Carpal tunnel syndrome, left upper limb: Secondary | ICD-10-CM | POA: Diagnosis not present

## 2017-07-03 DIAGNOSIS — F419 Anxiety disorder, unspecified: Secondary | ICD-10-CM | POA: Insufficient documentation

## 2017-07-03 DIAGNOSIS — Z888 Allergy status to other drugs, medicaments and biological substances status: Secondary | ICD-10-CM | POA: Insufficient documentation

## 2017-07-03 DIAGNOSIS — Z79899 Other long term (current) drug therapy: Secondary | ICD-10-CM | POA: Diagnosis not present

## 2017-07-03 DIAGNOSIS — E785 Hyperlipidemia, unspecified: Secondary | ICD-10-CM | POA: Insufficient documentation

## 2017-07-03 DIAGNOSIS — Z87891 Personal history of nicotine dependence: Secondary | ICD-10-CM | POA: Insufficient documentation

## 2017-07-03 DIAGNOSIS — I10 Essential (primary) hypertension: Secondary | ICD-10-CM | POA: Insufficient documentation

## 2017-07-03 DIAGNOSIS — Z6834 Body mass index (BMI) 34.0-34.9, adult: Secondary | ICD-10-CM | POA: Insufficient documentation

## 2017-07-03 DIAGNOSIS — K219 Gastro-esophageal reflux disease without esophagitis: Secondary | ICD-10-CM | POA: Diagnosis not present

## 2017-07-03 HISTORY — PX: CARPAL TUNNEL RELEASE: SHX101

## 2017-07-03 HISTORY — DX: Personal history of urinary calculi: Z87.442

## 2017-07-03 HISTORY — DX: Pneumonia, unspecified organism: J18.9

## 2017-07-03 HISTORY — DX: Anemia, unspecified: D64.9

## 2017-07-03 SURGERY — CARPAL TUNNEL RELEASE
Anesthesia: General | Site: Wrist | Laterality: Left

## 2017-07-03 MED ORDER — DEXAMETHASONE SODIUM PHOSPHATE 4 MG/ML IJ SOLN
INTRAMUSCULAR | Status: DC | PRN
  Administered 2017-07-03: 10 mg via INTRAVENOUS

## 2017-07-03 MED ORDER — MIDAZOLAM HCL 2 MG/2ML IJ SOLN: 0.5000 mg | Freq: Once | INTRAMUSCULAR | Status: DC | PRN

## 2017-07-03 MED ORDER — CHLORHEXIDINE GLUCONATE 4 % EX LIQD: 60.0000 mL | Freq: Once | CUTANEOUS | Status: DC

## 2017-07-03 MED ORDER — MIDAZOLAM HCL 2 MG/2ML IJ SOLN
INTRAMUSCULAR | Status: AC
  Filled 2017-07-03: qty 2

## 2017-07-03 MED ORDER — SODIUM CHLORIDE 0.9 % IV SOLN
INTRAVENOUS | Status: DC | PRN
  Administered 2017-07-03: 1000 mg via INTRAVENOUS

## 2017-07-03 MED ORDER — BUPIVACAINE HCL (PF) 0.25 % IJ SOLN
INTRAMUSCULAR | Status: DC | PRN
  Administered 2017-07-03: 8 mL

## 2017-07-03 MED ORDER — FENTANYL CITRATE (PF) 100 MCG/2ML IJ SOLN: 25.0000 ug | INTRAMUSCULAR | Status: DC | PRN

## 2017-07-03 MED ORDER — PROPOFOL 10 MG/ML IV BOLUS
INTRAVENOUS | Status: AC
  Filled 2017-07-03: qty 20

## 2017-07-03 MED ORDER — MIDAZOLAM HCL 2 MG/2ML IJ SOLN: 1.0000 mg | INTRAMUSCULAR | Status: DC | PRN

## 2017-07-03 MED ORDER — LIDOCAINE 2% (20 MG/ML) 5 ML SYRINGE
INTRAMUSCULAR | Status: AC
  Filled 2017-07-03: qty 5

## 2017-07-03 MED ORDER — MEPERIDINE HCL 25 MG/ML IJ SOLN: 6.2500 mg | INTRAMUSCULAR | Status: DC | PRN

## 2017-07-03 MED ORDER — PROPOFOL 10 MG/ML IV BOLUS
INTRAVENOUS | Status: DC | PRN
  Administered 2017-07-03: 200 mg via INTRAVENOUS

## 2017-07-03 MED ORDER — ONDANSETRON HCL 4 MG/2ML IJ SOLN
INTRAMUSCULAR | Status: DC | PRN
  Administered 2017-07-03: 4 mg via INTRAVENOUS

## 2017-07-03 MED ORDER — FENTANYL CITRATE (PF) 100 MCG/2ML IJ SOLN: 50.0000 ug | INTRAMUSCULAR | Status: DC | PRN

## 2017-07-03 MED ORDER — PROMETHAZINE HCL 25 MG/ML IJ SOLN: 6.2500 mg | INTRAMUSCULAR | Status: DC | PRN

## 2017-07-03 MED ORDER — VANCOMYCIN HCL IN DEXTROSE 1-5 GM/200ML-% IV SOLN
1000.0000 mg | INTRAVENOUS | Status: AC
  Administered 2017-07-03: 1000 mg via INTRAVENOUS

## 2017-07-03 MED ORDER — FENTANYL CITRATE (PF) 100 MCG/2ML IJ SOLN
INTRAMUSCULAR | Status: AC
  Filled 2017-07-03: qty 2

## 2017-07-03 MED ORDER — DEXAMETHASONE SODIUM PHOSPHATE 10 MG/ML IJ SOLN
INTRAMUSCULAR | Status: AC
  Filled 2017-07-03: qty 1

## 2017-07-03 MED ORDER — SCOPOLAMINE 1 MG/3DAYS TD PT72: 1.0000 | MEDICATED_PATCH | Freq: Once | TRANSDERMAL | Status: DC | PRN

## 2017-07-03 MED ORDER — VANCOMYCIN HCL IN DEXTROSE 1-5 GM/200ML-% IV SOLN
INTRAVENOUS | Status: AC
  Filled 2017-07-03: qty 200

## 2017-07-03 MED ORDER — LIDOCAINE HCL (PF) 0.5 % IJ SOLN
INTRAMUSCULAR | Status: DC | PRN
  Administered 2017-07-03: 30 mL via INTRAVENOUS

## 2017-07-03 MED ORDER — ONDANSETRON HCL 4 MG/2ML IJ SOLN
INTRAMUSCULAR | Status: AC
  Filled 2017-07-03: qty 2

## 2017-07-03 MED ORDER — LACTATED RINGERS IV SOLN
INTRAVENOUS | Status: DC
  Administered 2017-07-03 (×2): via INTRAVENOUS

## 2017-07-03 MED ORDER — DIPHENHYDRAMINE HCL 50 MG/ML IJ SOLN
12.5000 mg | Freq: Four times a day (QID) | INTRAMUSCULAR | Status: DC | PRN
  Administered 2017-07-03: 12.5 mg via INTRAVENOUS

## 2017-07-03 MED ORDER — HYDROCODONE-ACETAMINOPHEN 5-325 MG PO TABS: ORAL_TABLET | ORAL | 0 refills | Status: DC

## 2017-07-03 MED ORDER — FENTANYL CITRATE (PF) 100 MCG/2ML IJ SOLN
INTRAMUSCULAR | Status: DC | PRN
  Administered 2017-07-03: 25 ug via INTRAVENOUS

## 2017-07-03 MED ORDER — DIPHENHYDRAMINE HCL 50 MG/ML IJ SOLN
INTRAMUSCULAR | Status: AC
  Filled 2017-07-03: qty 1

## 2017-07-03 MED ORDER — MIDAZOLAM HCL 5 MG/5ML IJ SOLN
INTRAMUSCULAR | Status: DC | PRN
  Administered 2017-07-03: 2 mg via INTRAVENOUS

## 2017-07-03 SURGICAL SUPPLY — 35 items
BANDAGE ACE 3X5.8 VEL STRL LF (GAUZE/BANDAGES/DRESSINGS) ×2 IMPLANT
BLADE SURG 15 STRL LF DISP TIS (BLADE) ×2 IMPLANT
BLADE SURG 15 STRL SS (BLADE) ×2
BNDG ESMARK 4X9 LF (GAUZE/BANDAGES/DRESSINGS) IMPLANT
BNDG GAUZE ELAST 4 BULKY (GAUZE/BANDAGES/DRESSINGS) ×2 IMPLANT
CHLORAPREP W/TINT 26ML (MISCELLANEOUS) ×2 IMPLANT
CORD BIPOLAR FORCEPS 12FT (ELECTRODE) ×2 IMPLANT
COVER BACK TABLE 60X90IN (DRAPES) ×2 IMPLANT
COVER MAYO STAND STRL (DRAPES) ×2 IMPLANT
CUFF TOURNIQUET SINGLE 18IN (TOURNIQUET CUFF) ×2 IMPLANT
DRAPE EXTREMITY T 121X128X90 (DRAPE) ×2 IMPLANT
DRAPE SURG 17X23 STRL (DRAPES) ×2 IMPLANT
DRSG PAD ABDOMINAL 8X10 ST (GAUZE/BANDAGES/DRESSINGS) ×2 IMPLANT
GAUZE SPONGE 4X4 12PLY STRL (GAUZE/BANDAGES/DRESSINGS) ×2 IMPLANT
GAUZE XEROFORM 1X8 LF (GAUZE/BANDAGES/DRESSINGS) ×2 IMPLANT
GLOVE BIO SURGEON STRL SZ 6.5 (GLOVE) ×2 IMPLANT
GLOVE BIO SURGEON STRL SZ7.5 (GLOVE) ×2 IMPLANT
GLOVE BIOGEL PI IND STRL 7.0 (GLOVE) ×1 IMPLANT
GLOVE BIOGEL PI IND STRL 8 (GLOVE) ×1 IMPLANT
GLOVE BIOGEL PI INDICATOR 7.0 (GLOVE) ×1
GLOVE BIOGEL PI INDICATOR 8 (GLOVE) ×1
GOWN STRL REUS W/ TWL LRG LVL3 (GOWN DISPOSABLE) ×1 IMPLANT
GOWN STRL REUS W/TWL LRG LVL3 (GOWN DISPOSABLE) ×1
GOWN STRL REUS W/TWL XL LVL3 (GOWN DISPOSABLE) ×2 IMPLANT
NEEDLE HYPO 25X1 1.5 SAFETY (NEEDLE) ×2 IMPLANT
NS IRRIG 1000ML POUR BTL (IV SOLUTION) ×2 IMPLANT
PACK BASIN DAY SURGERY FS (CUSTOM PROCEDURE TRAY) ×2 IMPLANT
PADDING CAST ABS 4INX4YD NS (CAST SUPPLIES) ×1
PADDING CAST ABS COTTON 4X4 ST (CAST SUPPLIES) ×1 IMPLANT
STOCKINETTE 4X48 STRL (DRAPES) ×2 IMPLANT
SUT ETHILON 4 0 PS 2 18 (SUTURE) ×2 IMPLANT
SYR BULB 3OZ (MISCELLANEOUS) ×2 IMPLANT
SYR CONTROL 10ML LL (SYRINGE) ×2 IMPLANT
TOWEL OR 17X24 6PK STRL BLUE (TOWEL DISPOSABLE) ×4 IMPLANT
UNDERPAD 30X30 (UNDERPADS AND DIAPERS) ×2 IMPLANT

## 2017-07-03 NOTE — Anesthesia Procedure Notes (Signed)
Procedure Name: LMA Insertion Date/Time: 07/03/2017 9:11 AM Performed by: [place] DesanctisLinka, Aaran Enberg L, CRNA Pre-anesthesia Checklist: Patient identified, Emergency Drugs available, Suction available, Patient being monitored and Timeout performed Patient Re-evaluated:Patient Re-evaluated prior to induction Oxygen Delivery Method: Circle system utilized Preoxygenation: Pre-oxygenation with 100% oxygen Induction Type: IV induction Ventilation: Mask ventilation without difficulty LMA: LMA inserted LMA Size: 4.0 Number of attempts: 1 Airway Equipment and Method: Bite block Placement Confirmation: positive ETCO2 Tube secured with: Tape Dental Injury: Teeth and Oropharynx as per pre-operative assessment

## 2017-07-03 NOTE — Discharge Instructions (Addendum)

## 2017-07-03 NOTE — Brief Op Note (Signed)
07/03/2017  9:36 AM  PATIENT:  Shannon Mckenzie  57 y.o. female  PRE-OPERATIVE DIAGNOSIS:  LEFT CARPAL TUNNEL SYNDROME  POST-OPERATIVE DIAGNOSIS:  LEFT CARPAL TUNNEL SYNDROME  PROCEDURE:  Procedure(s): LEFT CARPAL TUNNEL RELEASE (Left)  SURGEON:  Surgeon(s) and Role:    * Betha LoaKuzma, Randy Whitener, MD - Primary  PHYSICIAN ASSISTANT:   ASSISTANTS: none   ANESTHESIA:   general  EBL:  Minimal    BLOOD ADMINISTERED:none  DRAINS: none   LOCAL MEDICATIONS USED:  MARCAINE     SPECIMEN:  No Specimen  DISPOSITION OF SPECIMEN:  N/A  COUNTS:  YES  TOURNIQUET:   Total Tourniquet Time Documented: Forearm (Left) - 27 minutes Total: Forearm (Left) - 27 minutes   DICTATION: .Note written in EPIC  PLAN OF CARE: Discharge to home after PACU  PATIENT DISPOSITION:  PACU - hemodynamically stable.

## 2017-07-03 NOTE — Transfer of Care (Signed)
Immediate Anesthesia Transfer of Care Note  Patient: Shannon Mckenzie  Procedure(s) Performed: LEFT CARPAL TUNNEL RELEASE (Left Wrist)  Patient Location: PACU  Anesthesia Type:General  Level of Consciousness: awake and patient cooperative  Airway & Oxygen Therapy: Patient Spontanous Breathing and Patient connected to face mask oxygen  Post-op Assessment: Report given to RN and Post -op Vital signs reviewed and stable  Post vital signs: Reviewed and stable  Last Vitals:  Vitals:   07/03/17 0730  BP: 134/71  Pulse: 80  Resp: 16  Temp: 36.4 C  SpO2: 99%    Last Pain:  Vitals:   07/03/17 0730  TempSrc: Oral         Complications: No apparent anesthesia complications

## 2017-07-03 NOTE — Anesthesia Procedure Notes (Signed)
Anesthesia Regional Block: Bier block (IV Regional)   Pre-Anesthetic Checklist: ,, timeout performed, Correct Patient, Correct Site, Correct Laterality, Correct Procedure, Correct Position, site marked, Risks and benefits discussed,  Surgical consent,  Pre-op evaluation,  At surgeon's request and post-op pain management  Laterality: Left  Prep: alcohol swabs        Procedures:,,,,,, Esmarch exsanguination, single tourniquet utilized,  Narrative:  Start time: 07/03/2017 9:00 AM End time: 07/03/2017 9:01 AM  Events: blood aspirated,,,,,,,,,,

## 2017-07-03 NOTE — Op Note (Signed)
07/03/2017 [facility]                              OPERATIVE REPORT   PREOPERATIVE DIAGNOSIS:  Left carpal tunnel syndrome.  POSTOPERATIVE DIAGNOSIS:  Left carpal tunnel syndrome.  PROCEDURE:  Left carpal tunnel release.  SURGEON:  Betha Loa, MD  ASSISTANT:  none.  ANESTHESIA:  General.  IV FLUIDS:  Per anesthesia flow sheet.  ESTIMATED BLOOD LOSS:  Minimal.  COMPLICATIONS:  None.  SPECIMENS:  None.  TOURNIQUET TIME:    Total Tourniquet Time Documented: Forearm (Left) - 27 minutes Total: Forearm (Left) - 27 minutes   DISPOSITION:  Stable to PACU.  LOCATION:  SURGERY CENTER  INDICATIONS:  57 yo female with decreased sensation in left hand.  Positive nerve conduction studies.  She wishes to have a carpal tunnel release for management of her symptoms.  Risks, benefits and alternatives of surgery were discussed including the risk of blood loss; infection; damage to nerves, vessels, tendons, ligaments, bone; failure of surgery; need for additional surgery; complications with wound healing; continued pain; recurrence of carpal tunnel syndrome; and damage to motor branch. She voiced understanding of these risks and elected to proceed.   OPERATIVE COURSE:  After being identified preoperatively by myself, the patient and I agreed upon the procedure and site of procedure.  The surgical site was marked.  The risks, benefits, and alternatives of the surgery were reviewed and she wished to proceed.  Surgical consent had been signed.  She was given IV Ancef as preoperative antibiotic prophylaxis.  She was transferred to the operating room and placed on the operating room table in supine position with the Left upper extremity on an armboard.  Bier block anesthesia was inadequate and therefore  General was induced by Anesthesiology.  Left upper extremity was prepped and draped in normal sterile orthopaedic fashion.  A surgical pause was performed between the surgeons,  anesthesia, and operating room staff, and all were in agreement as to the patient, procedure, and site of procedure.  Tourniquet at the proximal aspect of the forearm had been inflated for the Bier block.   Incision was made over the transverse carpal ligament and carried into the subcutaneous tissues by spreading technique.  Bipolar electrocautery was used to obtain hemostasis.  The palmar fascia was sharply incised.  The transverse carpal ligament was identified and sharply incised.  It was incised distally first.  Care was taken to ensure complete decompression distally.  It was then incised proximally.  Scissors were used to split the distal aspect of the volar antebrachial fascia.  A finger was placed into the wound to ensure complete decompression, which was the case.  The nerve was examined.  It was adherent to the radial leaflet.  The motor branch was identified and was intact.  The wound was copiously irrigated with sterile saline.  It was then closed with 4-0 nylon in a horizontal mattress fashion.  It was injected with 0.25% plain Marcaine to aid in postoperative analgesia.  It was dressed with sterile Xeroform, 4x4s, an ABD, and wrapped with Kerlix and an Ace bandage.  Tourniquet was deflated at 27 minutes.  Fingertips were pink with brisk capillary refill after deflation of the tourniquet.  Operative drapes were broken down.  The patient was awoken from anesthesia safely.  She was transferred back to stretcher and taken to the PACU in stable condition.  I will see her  back in the office in 1 week for postoperative followup.  I will give her a prescription for norco 5/325 1-2 tabs PO q6 hours prn pain, dispense #20.    Tami RibasKUZMA,Aniket Paye R, MD Electronically signed, 07/03/17

## 2017-07-03 NOTE — H&P (Signed)
Shannon Mckenzie is an 57 y.o. female.   Chief Complaint: left carpal tunnel syndrome HPI: 57 yo female with carpal tunnel syndrome.  Decreased sensation left hand.  Positive nerve conduction studies.  She wishes to have a carpal tunnel release.  Allergies:  Allergies  Allergen Reactions  . Statins     Myalgias with all statins  . Amoxicillin Hives and Rash    Past Medical History:  Diagnosis Date  . Allergy   . Anemia   . Anxiety   . Asthma   . Depression   . DJD (degenerative joint disease) of hip   . Former smoker   . GERD (gastroesophageal reflux disease)   . Hiatal hernia   . History of kidney stones    history  . Hyperlipidemia   . Hypertension   . Kidney stone   . Pneumonia    hx but no hospitalization  . Reactive airway disease   . RLS (restless legs syndrome)   . Routine gynecological examination    sees gynecology, [name] NP  . SOB (shortness of breath) 9/14   cardiac eval with stress test and echo; Dr. Viann FishSpencer Tilley  . Statin intolerance   . Vision problems    wears contact lenses    Past Surgical History:  Procedure Laterality Date  . COLONOSCOPY  06/2012   Dr. Ramon DredgeEdward; repeat 10 years  . UPPER GI ENDOSCOPY N/A 07/24/2012   Hiatus Hernia,normal stomach, normal examined duodenum  . WISDOM TOOTH EXTRACTION      Family History: Family History  Problem Relation Age of Onset  . Arthritis Mother   . Asthma Mother   . Diabetes Mother   . Cirrhosis Mother        died of liver nonalcoholic cirrhosis  . COPD Mother   . Heart disease Mother        CHF  . Asthma Sister        doesnt know history well  . Drug abuse Sister   . Cancer Father        died of pancreatic cancer at age 56  . Depression Brother   . Alcohol abuse Brother   . Stroke Maternal Aunt        died age 56yo of CVA  . Other Brother        died of MVA  . Other Sister   . Drug abuse Sister        doesnt know history well    Social History:   reports that she quit smoking  about 26 years ago. Her smoking use included cigarettes. She has a 15.00 pack-year smoking history. she has never used smokeless tobacco. She reports that she drinks alcohol. She reports that she does not use drugs.  Medications: Medications Prior to Admission  Medication Sig Dispense Refill  . albuterol (VENTOLIN HFA) 108 (90 Base) MCG/ACT inhaler Inhale 2 puffs into the lungs every 6 (six) hours as needed for wheezing. 1 Inhaler 1  . Cholecalciferol (D3 HIGH POTENCY) 2000 units CAPS Take by mouth.    . diclofenac (VOLTAREN) 75 MG EC tablet Take 1 tablet by mouth 2 (two) times daily.   2  . escitalopram (LEXAPRO) 20 MG tablet Take 1 tablet (20 mg total) daily by mouth. 30 tablet 2  . esomeprazole (NEXIUM) 40 MG capsule Take 1 capsule (40 mg total) by mouth 2 (two) times daily. 180 capsule 3  . ezetimibe (ZETIA) 10 MG tablet TAKE 1 TABLET(10 MG) BY MOUTH DAILY 30  tablet 5  . ferrous fumarate (HEMOCYTE - 106 MG FE) 325 (106 FE) MG TABS tablet Take 1 tablet by mouth.    . fluticasone furoate-vilanterol (BREO ELLIPTA) 100-25 MCG/INH AEPB Inhale 1 puff daily into the lungs. 60 each 2  . losartan (COZAAR) 50 MG tablet Take 1 tablet (50 mg total) by mouth daily. 90 tablet 1  . Melatonin 5 MG TABS Take by mouth.    . montelukast (SINGULAIR) 10 MG tablet TAKE 1 TABLET BY MOUTH EVERY NIGHT AT BEDTIME 30 tablet 5  . predniSONE (DELTASONE) 20 MG tablet Take 1 tablet by mouth daily for 5 days, then 1/2 tablet daily for 2 days 6 tablet 0  . fluticasone (FLONASE) 50 MCG/ACT nasal spray SHAKE LIQUID AND USE 2 SPRAYS IN EACH NOSTRIL EVERY DAY 16 g 2    No results found for this or any previous visit (from the past 48 hour(s)).  No results found.   A comprehensive review of systems was negative.  Blood pressure 134/71, pulse 80, temperature 97.6 F (36.4 C), temperature source Oral, resp. rate 16, height 5\' 2"  (1.575 m), weight 85.5 kg (188 lb 6.4 oz), SpO2 99 %.  General appearance: alert, cooperative  and appears stated age Head: Normocephalic, without obvious abnormality, atraumatic Neck: supple, symmetrical, trachea midline Resp: clear to auscultation bilaterally Cardio: regular rate and rhythm GI: non-tender Extremities: Intact sensation and capillary refill all digits.  +epl/fpl/io.  No wounds.  Pulses: 2+ and symmetric Skin: Skin color, texture, turgor normal. No rashes or lesions Neurologic: Grossly normal Incision/Wound:none  Assessment/Plan Left carpal tunnel syndrome.  Non operative and operative treatment options were discussed with the patient and patient wishes to proceed with operative treatment. Risks, benefits, and alternatives of surgery were discussed and the patient agrees with the plan of care.   Jony Ladnier R 07/03/2017, 8:36 AM

## 2017-07-03 NOTE — Anesthesia Preprocedure Evaluation (Addendum)
Anesthesia Evaluation  Patient identified by MRN, date of birth, ID band Patient awake    Reviewed: Allergy & Precautions, NPO status , Patient's Chart, lab work & pertinent test results  History of Anesthesia Complications Negative for: history of anesthetic complications  Airway Mallampati: II  TM Distance: >3 FB Neck ROM: Full    Dental  (+) Dental Advisory Given   Pulmonary COPD (last inhaler use about a month ago),  COPD inhaler, former smoker (quit 1993),    breath sounds clear to auscultation       Cardiovascular hypertension, Pt. on medications (-) angina Rhythm:Regular Rate:Normal     Neuro/Psych Anxiety Depression negative neurological ROS     GI/Hepatic Neg liver ROS, hiatal hernia, GERD  Medicated,  Endo/Other  Morbid obesity  Renal/GU negative Renal ROS     Musculoskeletal  (+) Arthritis  (off of steroids for past 5 days), Osteoarthritis and steroids,    Abdominal (+) + obese,   Peds  Hematology negative hematology ROS (+)   Anesthesia Other Findings   Reproductive/Obstetrics                            Anesthesia Physical Anesthesia Plan  ASA: II  Anesthesia Plan: Bier Block and Bier Block-LIDOCAINE ONLY   Post-op Pain Management:    Induction:   PONV Risk Score and Plan: 1 and Ondansetron and Dexamethasone  Airway Management Planned: Natural Airway and Simple Face Mask  Additional Equipment:   Intra-op Plan:   Post-operative Plan:   Informed Consent: I have reviewed the patients History and Physical, chart, labs and discussed the procedure including the risks, benefits and alternatives for the proposed anesthesia with the patient or authorized representative who has indicated his/her understanding and acceptance.   Dental advisory given  Plan Discussed with: CRNA and Surgeon  Anesthesia Plan Comments: (Plan routine monitors, IV regional Lidocaine with  sedation)        Anesthesia Quick Evaluation

## 2017-07-04 ENCOUNTER — Encounter (HOSPITAL_BASED_OUTPATIENT_CLINIC_OR_DEPARTMENT_OTHER): Payer: Self-pay | Admitting: Orthopedic Surgery

## 2017-07-04 NOTE — Anesthesia Postprocedure Evaluation (Signed)
Anesthesia Post Note  Patient: Shannon AcostaKatherine W Mckenzie  Procedure(s) Performed: LEFT CARPAL TUNNEL RELEASE (Left Wrist)     Patient location during evaluation: PACU Anesthesia Type: General Level of consciousness: awake and alert, patient cooperative and oriented Pain management: pain level controlled Vital Signs Assessment: post-procedure vital signs reviewed and stable Respiratory status: spontaneous breathing, nonlabored ventilation and respiratory function stable Cardiovascular status: blood pressure returned to baseline and stable Postop Assessment: no apparent nausea or vomiting Anesthetic complications: no    Last Vitals:  Vitals:   07/03/17 1045 07/03/17 1107  BP: 122/67   Pulse: 79 70  Resp: 17   Temp:  36.6 C  SpO2: 91%     Last Pain:  Vitals:   07/04/17 0922  TempSrc:   PainSc: 2                  Mamoru Takeshita,E. Jaidence Geisler

## 2017-07-14 ENCOUNTER — Encounter: Payer: Self-pay | Admitting: Family Medicine

## 2017-07-14 ENCOUNTER — Ambulatory Visit (INDEPENDENT_AMBULATORY_CARE_PROVIDER_SITE_OTHER): Payer: BLUE CROSS/BLUE SHIELD | Admitting: Family Medicine

## 2017-07-14 VITALS — BP 120/90 | HR 74 | Temp 97.7°F | Ht 63.0 in | Wt 192.6 lb

## 2017-07-14 DIAGNOSIS — F325 Major depressive disorder, single episode, in full remission: Secondary | ICD-10-CM | POA: Diagnosis not present

## 2017-07-14 DIAGNOSIS — J4530 Mild persistent asthma, uncomplicated: Secondary | ICD-10-CM

## 2017-07-14 DIAGNOSIS — Z0000 Encounter for general adult medical examination without abnormal findings: Secondary | ICD-10-CM

## 2017-07-14 DIAGNOSIS — Z87891 Personal history of nicotine dependence: Secondary | ICD-10-CM

## 2017-07-14 DIAGNOSIS — F411 Generalized anxiety disorder: Secondary | ICD-10-CM

## 2017-07-14 DIAGNOSIS — I10 Essential (primary) hypertension: Secondary | ICD-10-CM

## 2017-07-14 DIAGNOSIS — E611 Iron deficiency: Secondary | ICD-10-CM | POA: Diagnosis not present

## 2017-07-14 DIAGNOSIS — E785 Hyperlipidemia, unspecified: Secondary | ICD-10-CM | POA: Diagnosis not present

## 2017-07-14 LAB — CBC
HCT: 37.8 % (ref 36.0–46.0)
Hemoglobin: 13 g/dL (ref 12.0–15.0)
MCHC: 34.5 g/dL (ref 30.0–36.0)
MCV: 88.7 fl (ref 78.0–100.0)
Platelets: 203 10*3/uL (ref 150.0–400.0)
RBC: 4.27 Mil/uL (ref 3.87–5.11)
RDW: 13.4 % (ref 11.5–15.5)
WBC: 4.9 10*3/uL (ref 4.0–10.5)

## 2017-07-14 LAB — POC URINALSYSI DIPSTICK (AUTOMATED)
BILIRUBIN UA: NEGATIVE
Glucose, UA: NEGATIVE
Ketones, UA: NEGATIVE
Leukocytes, UA: NEGATIVE
NITRITE UA: NEGATIVE
PROTEIN UA: NEGATIVE
RBC UA: NEGATIVE
Spec Grav, UA: >=1.03 — AB (ref 1.010–1.025)
UROBILINOGEN UA: 0.2 U/dL
pH, UA: 5.5 (ref 5.0–8.0)

## 2017-07-14 LAB — LIPID PANEL
CHOL/HDL RATIO: 6
Cholesterol: 251 mg/dL — ABNORMAL HIGH (ref 0–200)
HDL: 45.4 mg/dL (ref >39.00)
LDL Cholesterol: 181 mg/dL — ABNORMAL HIGH (ref 0–99)
NONHDL: 205.99
Triglycerides: 126 mg/dL (ref 0.0–149.0)
VLDL: 25.2 mg/dL (ref 0.0–40.0)

## 2017-07-14 LAB — COMPREHENSIVE METABOLIC PANEL
ALK PHOS: 80 U/L (ref 39–117)
ALT: 20 U/L (ref 0–35)
AST: 20 U/L (ref 0–37)
Albumin: 3.8 g/dL (ref 3.5–5.2)
BILIRUBIN TOTAL: 0.4 mg/dL (ref 0.2–1.2)
BUN: 27 mg/dL — ABNORMAL HIGH (ref 6–23)
CO2: 33 meq/L — AB (ref 19–32)
CREATININE: 0.74 mg/dL (ref 0.40–1.20)
Calcium: 9.4 mg/dL (ref 8.4–10.5)
Chloride: 104 mEq/L (ref 96–112)
GFR: 86.58 mL/min (ref >60.00)
GLUCOSE: 85 mg/dL (ref 70–99)
Potassium: 4.2 mEq/L (ref 3.5–5.1)
SODIUM: 142 meq/L (ref 135–145)
TOTAL PROTEIN: 5.9 g/dL — AB (ref 6.0–8.3)

## 2017-07-14 NOTE — Patient Instructions (Addendum)
Sign release of information at the check out desk for mammogram from GYN  Make sure they send us notes from rheumatology  May consider once a week statin trial for cholesterol depending on #s. Lets go for bad cholesterol   Please stop by lab before you go

## 2017-07-14 NOTE — Progress Notes (Signed)
Phone: [phone]  Subjective:  Patient presents today for their annual physical. Chief complaint-noted.   See problem oriented charting- ROS- full  review of systems was completed and negative except for: some constipation, urinary frequency and urgency, back and joint pain, muscle aches, weakness (limited by pain), sleep issues, activity change  The following were reviewed and entered/updated in epic: Past Medical History:  Diagnosis Date  . Allergy   . Anemia   . Anxiety   . Asthma   . Depression   . DJD (degenerative joint disease) of hip   . Former smoker   . GERD (gastroesophageal reflux disease)   . Hiatal hernia   . History of kidney stones    history  . Hyperlipidemia   . Hypertension   . Kidney stone   . Pneumonia    hx but no hospitalization  . Reactive airway disease   . RLS (restless legs syndrome)   . Routine gynecological examination    sees gynecology, [name] NP  . SOB (shortness of breath) 9/14   cardiac eval with stress test and echo; Dr. Viann FishSpencer Tilley  . Statin intolerance   . Vision problems    wears contact lenses   Patient Active Problem List   Diagnosis Date Noted  . Depression, major, single episode, moderate (HCC) 05/08/2017    Priority: High  . Carpal tunnel syndrome 05/26/2017    Priority: Medium  . Former smoker 05/26/2017    Priority: Medium  . GAD (generalized anxiety disorder) 05/08/2017    Priority: Medium  . Osteoarthritis 03/13/2015    Priority: Medium  . Anemia 04/16/2013    Priority: Medium  . Extrinsic asthma, mild persistent, uncomplicated 03/20/2013    Priority: Medium  . Upper airway cough syndrome ? all related to sinus dz 03/19/2013    Priority: Medium  . Essential hypertension, benign 04/15/2012    Priority: Medium  . Hyperlipidemia 04/15/2012    Priority: Medium  . Allergic rhinitis 05/26/2017    Priority: Low  . Iron deficiency 02/20/2016    Priority: Low  . Cough 03/13/2015    Priority: Low  . Back  pain 11/10/2014    Priority: Low  . Rash and nonspecific skin eruption 07/21/2014    Priority: Low  . Cold feeling 05/03/2014    Priority: Low  . Sinusitis, chronic 06/14/2013    Priority: Low  . Obesity 04/15/2012    Priority: Low   Past Surgical History:  Procedure Laterality Date  . CARPAL TUNNEL RELEASE Left 07/03/2017   Procedure: LEFT CARPAL TUNNEL RELEASE;  Surgeon: Betha LoaKuzma, Kevin, MD;  Location: [facility];  Service: Orthopedics;  Laterality: Left;  . COLONOSCOPY  06/2012   Dr. Ramon DredgeEdward; repeat 10 years  . UPPER GI ENDOSCOPY N/A 07/24/2012   Hiatus Hernia,normal stomach, normal examined duodenum  . WISDOM TOOTH EXTRACTION      Family History  Problem Relation Age of Onset  . Arthritis Mother   . Asthma Mother   . Diabetes Mother   . Cirrhosis Mother        died of liver nonalcoholic cirrhosis  . COPD Mother   . Heart disease Mother        CHF  . Asthma Sister        doesnt know history well  . Drug abuse Sister   . Cancer Father        died of pancreatic cancer at age 56  . Depression Brother   . Alcohol abuse Brother   .  Stroke Maternal Aunt        died age 85yo of CVA  . Other Brother        died of MVA  . Other Sister   . Drug abuse Sister        doesnt know history well    Medications- reviewed and updated Current Outpatient Medications  Medication Sig Dispense Refill  . albuterol (VENTOLIN HFA) 108 (90 Base) MCG/ACT inhaler Inhale 2 puffs into the lungs every 6 (six) hours as needed for wheezing. 1 Inhaler 1  . Cholecalciferol (D3 HIGH POTENCY) 2000 units CAPS Take by mouth.    . diclofenac (VOLTAREN) 75 MG EC tablet Take 1 tablet by mouth 2 (two) times daily.   2  . escitalopram (LEXAPRO) 20 MG tablet Take 1 tablet (20 mg total) daily by mouth. 30 tablet 2  . esomeprazole (NEXIUM) 40 MG capsule Take 1 capsule (40 mg total) by mouth 2 (two) times daily. 180 capsule 3  . ezetimibe (ZETIA) 10 MG tablet TAKE 1 TABLET(10 MG) BY MOUTH DAILY 30  tablet 5  . ferrous fumarate (HEMOCYTE - 106 MG FE) 325 (106 FE) MG TABS tablet Take 1 tablet by mouth.    . fluticasone furoate-vilanterol (BREO ELLIPTA) 100-25 MCG/INH AEPB Inhale 1 puff daily into the lungs. 60 each 2  . losartan (COZAAR) 50 MG tablet Take 1 tablet (50 mg total) by mouth daily. 90 tablet 1  . Melatonin 5 MG TABS Take by mouth.    . montelukast (SINGULAIR) 10 MG tablet TAKE 1 TABLET BY MOUTH EVERY NIGHT AT BEDTIME 30 tablet 5   No current facility-administered medications for this visit.     Allergies-reviewed and updated Allergies  Allergen Reactions  . Statins     Myalgias with all statins  . Amoxicillin Hives and Rash    Social History   Socioeconomic History  . Marital status: Married    Spouse name: None  . Number of children: 1  . Years of education: 18  . Highest education level: None  Social Needs  . Financial resource strain: None  . Food insecurity - worry: None  . Food insecurity - inability: None  . Transportation needs - medical: None  . Transportation needs - non-medical: None  Occupational History  . Occupation: Equities trader: UNEMPLOYED  Tobacco Use  . Smoking status: Former Smoker    Packs/day: 1.00    Years: 15.00    Pack years: 15.00    Types: Cigarettes    Last attempt to quit: 06/25/1991    Years since quitting: 26.0  . Smokeless tobacco: Never Used  Substance and Sexual Activity  . Alcohol use: Yes    Alcohol/week: 0.0 oz    Frequency: Never    Comment: margharita  . Drug use: No  . Sexual activity: Not Currently  Other Topics Concern  . None  Social History Narrative   Married, 1 biological son, 1 adopted son.        Faith: Christian. Denies any religious beliefs that would effect health care.       Cleans homes for a living.       Fun: Watch movies, be outside and work in the yard, photography    Objective: BP 120/90 (BP Location: Left Arm, Patient Position: Sitting, Cuff Size: Large)   Pulse 74    Temp 97.7 F (36.5 C) (Oral)   Ht 5\' 3"  (1.6 m)   Wt 192 lb 9.6 oz (87.4 kg)  SpO2 96%   BMI 34.12 kg/m  Gen: NAD, resting comfortably HEENT: Mucous membranes are moist. Oropharynx normal Neck: no thyromegaly CV: RRR no murmurs rubs or gallops Lungs: CTAB no crackles, wheeze, rhonchi Abdomen: soft/nontender/nondistended/normal bowel sounds. No rebound or guarding.  Ext: no edema Skin: warm, dry Neuro: grossly normal, moves all extremities, PERRLA  Assessment/Plan:  57 y.o. female presenting for annual physical.  Health Maintenance counseling: 1. Anticipatory guidance: Patient counseled regarding regular dental exams -q6 months, eye exams - yearly, wearing seatbelts.  2. Risk factor reduction:  Advised patient of need for regular exercise and diet rich and fruits and vegetables to reduce risk of heart attack and stroke. Exercise- limited by her chronic pain. Diet-has had weight gain on prednisone (states just taken off before surgery- Dr. Madelon Lips was prescribing).  Has been trying to eat at home- overdoes the sweets and needs to cut back.  Wt Readings from Last 3 Encounters:  07/14/17 192 lb 9.6 oz (87.4 kg)  07/03/17 188 lb 6.4 oz (85.5 kg)  05/26/17 185 lb (83.9 kg)  3. Immunizations/screenings/ancillary studies- discussed shingrix availability issues Immunization History  Administered Date(s) Administered  . Influenza Split 07/06/2011, 03/20/2012  . Influenza Whole 04/02/2001, 04/23/2002  . Influenza,inj,Quad PF,6+ Mos 03/19/2013, 02/23/2014  . Influenza-Unspecified 03/24/2017  . Pneumococcal Polysaccharide-23 03/20/2012  . Td 02/16/2005  . Tdap 07/06/2014  4. Cervical cancer screening- 01/30/2017 - and gets yearly 5. Breast cancer screening-  breast exam today with GYN and mammogram in 2018- will get records 6. Colon cancer screening - 07/24/12 with 10 year follow up.  7. Skin cancer screening- no dermatologist. advised regular sunscreen use. Denies worrisome, changing, or  new skin lesions.  8. Birth control/STD check- postmenopausal 9. Osteoporosis screening at 21- taking vitamin D. Will plan on this at 66 10. Former smoker- will get UA  Status of chronic or acute concerns   urinary frequency and urgency- will check UA. May have slight overactive bladder. Has also talked to gyn  Constipation - working on more veggies and water intake  Had carpal tunnel surgery on left hand this month  Asthma- patient remains on singulair, breo. She has albuterol and is using infrequently- last 2 months ago  On voltaren 75 mg BID for OA- in 2016 was referred to PT, rheum per prior provider. She sees rheumatology next week- states feeling worse overall.   GERd remains on nexium 40mg   Hyperlipidemia- poorly controlled last year despite zetia. We discussed if LDL over 160 to trial rosuvastatin one a week. Myalgias on statin  Hypertension- usually controlled on losartan 50mg - has not taken this yet today. Suspect will be controlled back on medicine  Insomnia- usually controlled on melatonin  Anemia- has been on iron since 2014- no clear cause. Has had EGD and colonoscopy back in 2016.   Major depression in full remission (HCC) Depression/GAD- now controlled on lexapro 20mg . PHQ9 down to 3!!! Doing some counseling by fine  Return in about 6 months (around 01/11/2018) for follow up- or sooner if needed.  Lab/Order associations: Essential hypertension, benign - Plan: CBC, Comprehensive metabolic panel, Lipid panel, POCT Urinalysis Dipstick (Automated)  Hyperlipidemia, unspecified hyperlipidemia type - Plan: CBC, Comprehensive metabolic panel, Lipid panel, POCT Urinalysis Dipstick (Automated)  Former smoker - Plan: POCT Urinalysis Dipstick (Automated)  Return precautions advised.  [name], MD

## 2017-07-14 NOTE — Addendum Note (Signed)
Addended by: Felix AhmadiFRANSEN, Kaitlynne Wenz A on: 07/14/2017 10:18 AM   Modules accepted: Orders

## 2017-07-14 NOTE — Assessment & Plan Note (Signed)
Depression/GAD- now controlled on lexapro 20mg . PHQ9 down to 3!!! Doing some counseling by fine

## 2017-07-15 ENCOUNTER — Other Ambulatory Visit: Payer: Self-pay

## 2017-07-15 MED ORDER — ROSUVASTATIN CALCIUM 10 MG PO TABS: 10.0000 mg | ORAL_TABLET | ORAL | 3 refills | Status: DC

## 2017-07-18 DIAGNOSIS — G5603 Carpal tunnel syndrome, bilateral upper limbs: Secondary | ICD-10-CM | POA: Diagnosis not present

## 2017-07-18 DIAGNOSIS — M2550 Pain in unspecified joint: Secondary | ICD-10-CM | POA: Diagnosis not present

## 2017-07-18 DIAGNOSIS — M150 Primary generalized (osteo)arthritis: Secondary | ICD-10-CM | POA: Diagnosis not present

## 2017-07-21 DIAGNOSIS — G5603 Carpal tunnel syndrome, bilateral upper limbs: Secondary | ICD-10-CM | POA: Diagnosis not present

## 2017-07-30 ENCOUNTER — Other Ambulatory Visit: Payer: Self-pay | Admitting: Family Medicine

## 2017-08-01 ENCOUNTER — Encounter: Payer: Self-pay | Admitting: Family Medicine

## 2017-08-01 LAB — C-REACTIVE PROTEIN, QUANT: C-Reactive Protein, Quant: 4.3

## 2017-08-02 ENCOUNTER — Encounter: Payer: Self-pay | Admitting: Family Medicine

## 2017-08-02 DIAGNOSIS — M0579 Rheumatoid arthritis with rheumatoid factor of multiple sites without organ or systems involvement: Secondary | ICD-10-CM | POA: Insufficient documentation

## 2017-08-13 ENCOUNTER — Other Ambulatory Visit: Payer: Self-pay | Admitting: Family Medicine

## 2017-08-18 DIAGNOSIS — M25552 Pain in left hip: Secondary | ICD-10-CM | POA: Diagnosis not present

## 2017-08-21 DIAGNOSIS — M25561 Pain in right knee: Secondary | ICD-10-CM | POA: Diagnosis not present

## 2017-08-24 ENCOUNTER — Other Ambulatory Visit: Payer: Self-pay | Admitting: Family Medicine

## 2017-08-27 DIAGNOSIS — M25552 Pain in left hip: Secondary | ICD-10-CM | POA: Diagnosis not present

## 2017-09-01 DIAGNOSIS — M545 Low back pain: Secondary | ICD-10-CM | POA: Diagnosis not present

## 2017-09-01 DIAGNOSIS — M25551 Pain in right hip: Secondary | ICD-10-CM | POA: Diagnosis not present

## 2017-09-06 ENCOUNTER — Encounter (HOSPITAL_COMMUNITY): Payer: Self-pay | Admitting: Emergency Medicine

## 2017-09-06 ENCOUNTER — Other Ambulatory Visit: Payer: Self-pay

## 2017-09-06 DIAGNOSIS — Z87891 Personal history of nicotine dependence: Secondary | ICD-10-CM | POA: Diagnosis not present

## 2017-09-06 DIAGNOSIS — J45909 Unspecified asthma, uncomplicated: Secondary | ICD-10-CM | POA: Insufficient documentation

## 2017-09-06 DIAGNOSIS — R1031 Right lower quadrant pain: Secondary | ICD-10-CM | POA: Diagnosis not present

## 2017-09-06 DIAGNOSIS — Z79899 Other long term (current) drug therapy: Secondary | ICD-10-CM | POA: Insufficient documentation

## 2017-09-06 DIAGNOSIS — R109 Unspecified abdominal pain: Secondary | ICD-10-CM | POA: Diagnosis not present

## 2017-09-06 DIAGNOSIS — K449 Diaphragmatic hernia without obstruction or gangrene: Secondary | ICD-10-CM | POA: Diagnosis not present

## 2017-09-06 DIAGNOSIS — I10 Essential (primary) hypertension: Secondary | ICD-10-CM | POA: Diagnosis not present

## 2017-09-06 LAB — URINALYSIS, ROUTINE W REFLEX MICROSCOPIC
BACTERIA UA: NONE SEEN
BILIRUBIN URINE: NEGATIVE
Glucose, UA: NEGATIVE mg/dL
HGB URINE DIPSTICK: NEGATIVE
KETONES UR: NEGATIVE mg/dL
Nitrite: NEGATIVE
PROTEIN: NEGATIVE mg/dL
SPECIFIC GRAVITY, URINE: 1.026 (ref 1.005–1.030)
pH: 5 (ref 5.0–8.0)

## 2017-09-06 LAB — CBC
HEMATOCRIT: 42.3 % (ref 36.0–46.0)
Hemoglobin: 14 g/dL (ref 12.0–15.0)
MCH: 30.1 pg (ref 26.0–34.0)
MCHC: 33.1 g/dL (ref 30.0–36.0)
MCV: 91 fL (ref 78.0–100.0)
Platelets: 264 10*3/uL (ref 150–400)
RBC: 4.65 MIL/uL (ref 3.87–5.11)
RDW: 13 % (ref 11.5–15.5)
WBC: 8.8 10*3/uL (ref 4.0–10.5)

## 2017-09-06 LAB — BASIC METABOLIC PANEL
Anion gap: 9 (ref 5–15)
BUN: 32 mg/dL — ABNORMAL HIGH (ref 6–20)
CHLORIDE: 103 mmol/L (ref 101–111)
CO2: 25 mmol/L (ref 22–32)
Calcium: 9.6 mg/dL (ref 8.9–10.3)
Creatinine, Ser: 0.82 mg/dL (ref 0.44–1.00)
GFR calc non Af Amer: >60 mL/min (ref >60)
Glucose, Bld: 114 mg/dL — ABNORMAL HIGH (ref 65–99)
Potassium: 4.2 mmol/L (ref 3.5–5.1)
Sodium: 137 mmol/L (ref 135–145)

## 2017-09-06 LAB — LIPASE, BLOOD: Lipase: 33 U/L (ref 11–51)

## 2017-09-06 MED ORDER — ONDANSETRON 4 MG PO TBDP
4.0000 mg | ORAL_TABLET | Freq: Once | ORAL | Status: AC | PRN
  Administered 2017-09-06: 4 mg via ORAL
  Filled 2017-09-06: qty 1

## 2017-09-06 MED ORDER — OXYCODONE-ACETAMINOPHEN 5-325 MG PO TABS
1.0000 | ORAL_TABLET | ORAL | Status: DC | PRN
  Administered 2017-09-06: 1 via ORAL
  Filled 2017-09-06: qty 1

## 2017-09-06 NOTE — ED Triage Notes (Signed)
Pt comes in complaining of right flank and and RLQ abdominal pain. Patient states some nausea but no vomiting. Patient did see her doctor for this but pain has gotten worse. Patient tearful in triage.

## 2017-09-07 ENCOUNTER — Emergency Department (HOSPITAL_COMMUNITY): Payer: BLUE CROSS/BLUE SHIELD

## 2017-09-07 ENCOUNTER — Emergency Department (HOSPITAL_COMMUNITY)
Admission: EM | Admit: 2017-09-07 | Discharge: 2017-09-07 | Disposition: A | Discharge status: Home / Self Care | Payer: BLUE CROSS/BLUE SHIELD | Attending: Emergency Medicine | Admitting: Emergency Medicine

## 2017-09-07 DIAGNOSIS — K449 Diaphragmatic hernia without obstruction or gangrene: Secondary | ICD-10-CM | POA: Diagnosis not present

## 2017-09-07 DIAGNOSIS — R109 Unspecified abdominal pain: Secondary | ICD-10-CM

## 2017-09-07 MED ORDER — HYDROCODONE-ACETAMINOPHEN 5-325 MG PO TABS: 1.0000 | ORAL_TABLET | Freq: Four times a day (QID) | ORAL | 0 refills | Status: DC | PRN

## 2017-09-07 MED ORDER — HYDROCODONE-ACETAMINOPHEN 5-325 MG PO TABS
2.0000 | ORAL_TABLET | Freq: Once | ORAL | Status: AC
  Administered 2017-09-07: 2 via ORAL
  Filled 2017-09-07: qty 2

## 2017-09-07 NOTE — ED Provider Notes (Signed)
[facility]-EMERGENCY DEPT Provider Note   CSN: [id number] Arrival date & time: 09/06/17  1845     History   Chief Complaint Chief Complaint  Patient presents with  . Flank Pain  . Abdominal Pain    HPI Shannon Mckenzie is a 57 y.o. female.  Patient is a 57 year old female with past medical history of anxiety, asthma, hypertension.  She presents today for evaluation of right flank pain.  This is been ongoing for the past several days.  It began in the absence of any injury or trauma.  She reports pain in her right flank radiating into the right groin.  This has been persistent, however does occasionally worsen.  She denies any difficulty urinating or problems with bowel movements.  She denies any fevers or chills.   The history is provided by the patient.  Flank Pain  This is a new problem. Episode onset: Several days ago. The problem occurs constantly. The problem has been gradually worsening. Nothing aggravates the symptoms. Nothing relieves the symptoms. She has tried nothing for the symptoms.    Past Medical History:  Diagnosis Date  . Allergy   . Anemia   . Anxiety   . Asthma   . Depression   . DJD (degenerative joint disease) of hip   . Former smoker   . GERD (gastroesophageal reflux disease)   . Hiatal hernia   . History of kidney stones    history  . Hyperlipidemia   . Hypertension   . Kidney stone   . Pneumonia    hx but no hospitalization  . Reactive airway disease   . RLS (restless legs syndrome)   . Routine gynecological examination    sees gynecology, [name] NP  . SOB (shortness of breath) 9/14   cardiac eval with stress test and echo; Dr. [name]  . Statin intolerance   . Vision problems    wears contact lenses    Patient Active Problem List   Diagnosis Date Noted  . Rheumatoid arthritis involving multiple sites with positive rheumatoid factor (HCC) 08/02/2017  . Allergic rhinitis 05/26/2017  . Carpal tunnel  syndrome 05/26/2017  . Former smoker 05/26/2017  . Major depression in full remission (HCC) 05/08/2017  . GAD (generalized anxiety disorder) 05/08/2017  . Iron deficiency 02/20/2016  . Osteoarthritis 03/13/2015  . Cough 03/13/2015  . Back pain 11/10/2014  . Rash and nonspecific skin eruption 07/21/2014  . Cold feeling 05/03/2014  . Sinusitis, chronic 06/14/2013  . Anemia 04/16/2013  . Extrinsic asthma, mild persistent, uncomplicated 03/20/2013  . Upper airway cough syndrome ? all related to sinus dz 03/19/2013  . Essential hypertension, benign 04/15/2012  . Obesity 04/15/2012  . Hyperlipidemia 04/15/2012    Past Surgical History:  Procedure Laterality Date  . CARPAL TUNNEL RELEASE Left 07/03/2017   Procedure: LEFT CARPAL TUNNEL RELEASE;  Surgeon: Betha Loa, MD;  Location: [facility];  Service: Orthopedics;  Laterality: Left;  . COLONOSCOPY  06/2012   Dr. Ramon Dredge; repeat 10 years  . UPPER GI ENDOSCOPY N/A 07/24/2012   Hiatus Hernia,normal stomach, normal examined duodenum  . WISDOM TOOTH EXTRACTION      OB History    No data available       Home Medications    Prior to Admission medications   Medication Sig Start Date End Date Taking? Authorizing Provider  albuterol (VENTOLIN HFA) 108 (90 Base) MCG/ACT inhaler Inhale 2 puffs into the lungs every 6 (six) hours as needed  for wheezing. 07/29/16   Veryl Speak, FNP  BREO ELLIPTA 100-25 MCG/INH AEPB INHALE 1 PUFF INTO THE LUNGS DAILY 08/25/17   [name], MD  Cholecalciferol (D3 HIGH POTENCY) 2000 units CAPS Take by mouth.    [provider]  diclofenac (VOLTAREN) 75 MG EC tablet Take 1 tablet by mouth 2 (two) times daily.  04/08/17   [provider]  escitalopram (LEXAPRO) 20 MG tablet TAKE 1 TABLET(20 MG) BY MOUTH DAILY 08/13/17   [name], MD  esomeprazole (NEXIUM) 40 MG capsule Take 1 capsule (40 mg total) by mouth 2 (two) times daily. 07/06/14   Veryl Speak, FNP    ezetimibe (ZETIA) 10 MG tablet TAKE 1 TABLET(10 MG) BY MOUTH DAILY 05/29/17   [name], MD  ferrous fumarate (HEMOCYTE - 106 MG FE) 325 (106 FE) MG TABS tablet Take 1 tablet by mouth.    [provider]  losartan (COZAAR) 50 MG tablet Take 1 tablet (50 mg total) by mouth daily. 05/14/17   [name], MD  Melatonin 5 MG TABS Take by mouth.    [provider]  montelukast (SINGULAIR) 10 MG tablet TAKE 1 TABLET BY MOUTH EVERY NIGHT AT BEDTIME 05/29/17   [name], MD  rosuvastatin (CRESTOR) 10 MG tablet Take 1 tablet (10 mg total) by mouth once a week. 07/15/17   [name], MD    Family History Family History  Problem Relation Age of Onset  . Arthritis Mother   . Asthma Mother   . Diabetes Mother   . Cirrhosis Mother        died of liver nonalcoholic cirrhosis  . COPD Mother   . Heart disease Mother        CHF  . Asthma Sister        doesnt know history well  . Drug abuse Sister   . Cancer Father        died of pancreatic cancer at age 42  . Depression Brother   . Alcohol abuse Brother   . Stroke Maternal Aunt        died age 33yo of CVA  . Other Brother        died of MVA  . Other Sister   . Drug abuse Sister        doesnt know history well    Social History Social History   Tobacco Use  . Smoking status: Former Smoker    Packs/day: 1.00    Years: 15.00    Pack years: 15.00    Types: Cigarettes    Last attempt to quit: 06/25/1991    Years since quitting: 26.2  . Smokeless tobacco: Never Used  Substance Use Topics  . Alcohol use: Yes    Alcohol/week: 0.0 oz    Frequency: Never    Comment: margharita  . Drug use: No     Allergies   Statins and Amoxicillin   Review of Systems Review of Systems  Genitourinary: Positive for flank pain.  All other systems reviewed and are negative.    Physical Exam Updated Vital Signs BP (!) 137/100 (BP Location: Left Arm)   Pulse 62   Temp 97.8 F (36.6 C) (Oral)   Resp  15   Ht 5\' 2"  (1.575 m)   Wt 81.6 kg (180 lb)   SpO2 100%   BMI 32.92 kg/m   Physical Exam  Constitutional: She is oriented to person, place, and time. She appears well-developed  and well-nourished. No distress.  HENT:  Head: Normocephalic and atraumatic.  Neck: Normal range of motion. Neck supple.  Cardiovascular: Normal rate and regular rhythm. Exam reveals no gallop and no friction rub.  No murmur heard. Pulmonary/Chest: Effort normal and breath sounds normal. No respiratory distress. She has no wheezes.  Abdominal: Soft. Bowel sounds are normal. She exhibits no distension. There is tenderness in the right lower quadrant. There is CVA tenderness. There is no rigidity, no rebound and no guarding.  There is right-sided CVA tenderness and tenderness in the right lower quadrant.  Musculoskeletal: Normal range of motion.  Neurological: She is alert and oriented to person, place, and time.  Skin: Skin is warm and dry. She is not diaphoretic.  Nursing note and vitals reviewed.    ED Treatments / Results  Labs (all labs ordered are listed, but only abnormal results are displayed) Labs Reviewed  URINALYSIS, ROUTINE W REFLEX MICROSCOPIC - Abnormal; Notable for the following components:      Result Value   Leukocytes, UA SMALL (*)    Squamous Epithelial / LPF 0-5 (*)    All other components within normal limits  BASIC METABOLIC PANEL - Abnormal; Notable for the following components:   Glucose, Bld 114 (*)    BUN 32 (*)    All other components within normal limits  CBC  LIPASE, BLOOD    EKG  EKG Interpretation None       Radiology No results found.  Procedures Procedures (including critical care time)  Medications Ordered in ED Medications  oxyCODONE-acetaminophen (PERCOCET/ROXICET) 5-325 MG per tablet 1 tablet (1 tablet Oral Given 09/06/17 1947)  HYDROcodone-acetaminophen (NORCO/VICODIN) 5-325 MG per tablet 2 tablet (not administered)  ondansetron (ZOFRAN-ODT)  disintegrating tablet 4 mg (4 mg Oral Given 09/06/17 1951)     Initial Impression / Assessment and Plan / ED Course  I have reviewed the triage vital signs and the nursing notes.  Pertinent labs & imaging results that were available during my care of the patient were reviewed by me and considered in my medical decision making (see chart for details).  Patient presents here with complaints of flank pain for the past several days.  Her pain is worse with position and movement of her leg.  Her urinalysis is clear, laboratory studies are unremarkable, and CT scan shows no evidence for kidney stone or other intra-abdominal process.  Her pain is most likely musculoskeletal in nature.  This will be treated with anti-inflammatories, pain medicine, and follow-up as needed.  Final Clinical Impressions(s) / ED Diagnoses   Final diagnoses:  None    ED Discharge Orders    None       Geoffery Lyonselo, Sandeep Radell, MD 09/07/17 [phone]

## 2017-09-07 NOTE — Discharge Instructions (Signed)
Continue diclofenac as previously prescribed.  Begin taking hydrocodone as needed for pain not relieved with diclofenac.  Follow-up with your primary doctor if symptoms are not improving in the next 3-4 days, and return to the ER if you develop worsening pain, high fevers, or other new and concerning symptoms.

## 2017-09-08 ENCOUNTER — Ambulatory Visit: Payer: BLUE CROSS/BLUE SHIELD | Admitting: Family

## 2017-09-08 ENCOUNTER — Encounter: Payer: Self-pay | Admitting: Family

## 2017-09-08 VITALS — BP 128/78 | HR 70 | Temp 98.1°F | Ht 62.0 in | Wt 173.0 lb

## 2017-09-08 DIAGNOSIS — R109 Unspecified abdominal pain: Secondary | ICD-10-CM | POA: Diagnosis not present

## 2017-09-08 DIAGNOSIS — M25551 Pain in right hip: Secondary | ICD-10-CM | POA: Diagnosis not present

## 2017-09-08 NOTE — Patient Instructions (Signed)
Please call your orthopedist about your symptoms;

## 2017-09-08 NOTE — Progress Notes (Signed)
Shannon Mckenzie is a 57 y.o. female with the following history as recorded in EpicCare:  Patient Active Problem List   Diagnosis Date Noted  . Rheumatoid arthritis involving multiple sites with positive rheumatoid factor (HCC) 08/02/2017  . Allergic rhinitis 05/26/2017  . Carpal tunnel syndrome 05/26/2017  . Former smoker 05/26/2017  . Major depression in full remission (HCC) 05/08/2017  . GAD (generalized anxiety disorder) 05/08/2017  . Iron deficiency 02/20/2016  . Osteoarthritis 03/13/2015  . Cough 03/13/2015  . Back pain 11/10/2014  . Rash and nonspecific skin eruption 07/21/2014  . Cold feeling 05/03/2014  . Sinusitis, chronic 06/14/2013  . Anemia 04/16/2013  . Extrinsic asthma, mild persistent, uncomplicated 03/20/2013  . Upper airway cough syndrome ? all related to sinus dz 03/19/2013  . Essential hypertension, benign 04/15/2012  . Obesity 04/15/2012  . Hyperlipidemia 04/15/2012    Current Outpatient Medications  Medication Sig Dispense Refill  . albuterol (VENTOLIN HFA) 108 (90 Base) MCG/ACT inhaler Inhale 2 puffs into the lungs every 6 (six) hours as needed for wheezing. 1 Inhaler 1  . BREO ELLIPTA 100-25 MCG/INH AEPB INHALE 1 PUFF INTO THE LUNGS DAILY 60 each 0  . Cholecalciferol (D3 HIGH POTENCY) 2000 units CAPS Take by mouth.    . diclofenac (VOLTAREN) 75 MG EC tablet Take 1 tablet by mouth 2 (two) times daily.   2  . escitalopram (LEXAPRO) 20 MG tablet TAKE 1 TABLET(20 MG) BY MOUTH DAILY 30 tablet 5  . esomeprazole (NEXIUM) 40 MG capsule Take 1 capsule (40 mg total) by mouth 2 (two) times daily. 180 capsule 3  . ezetimibe (ZETIA) 10 MG tablet TAKE 1 TABLET(10 MG) BY MOUTH DAILY 30 tablet 5  . ferrous fumarate (HEMOCYTE - 106 MG FE) 325 (106 FE) MG TABS tablet Take 1 tablet by mouth.    . fluticasone (FLONASE) 50 MCG/ACT nasal spray fluticasone propionate 50 mcg/actuation nasal spray,suspension    . HYDROcodone-acetaminophen (NORCO) 5-325 MG tablet Take 1-2 tablets  by mouth every 6 (six) hours as needed. 15 tablet 0  . losartan (COZAAR) 50 MG tablet Take 1 tablet (50 mg total) by mouth daily. 90 tablet 1  . Melatonin 5 MG TABS Take by mouth.    . montelukast (SINGULAIR) 10 MG tablet TAKE 1 TABLET BY MOUTH EVERY NIGHT AT BEDTIME 30 tablet 5  . rosuvastatin (CRESTOR) 10 MG tablet Take 1 tablet (10 mg total) by mouth once a week. 12 tablet 3   No current facility-administered medications for this visit.     Allergies: Statins and Amoxicillin  Past Medical History:  Diagnosis Date  . Allergy   . Anemia   . Anxiety   . Asthma   . Depression   . DJD (degenerative joint disease) of hip   . Former smoker   . GERD (gastroesophageal reflux disease)   . Hiatal hernia   . History of kidney stones    history  . Hyperlipidemia   . Hypertension   . Kidney stone   . Pneumonia    hx but no hospitalization  . Reactive airway disease   . RLS (restless legs syndrome)   . Routine gynecological examination    sees gynecology, [name] NP  . SOB (shortness of breath) 9/14   cardiac eval with stress test and echo; Dr. [name]  . Statin intolerance   . Vision problems    wears contact lenses    Past Surgical History:  Procedure Laterality Date  . CARPAL TUNNEL RELEASE Left  07/03/2017   Procedure: LEFT CARPAL TUNNEL RELEASE;  Surgeon: Betha Loa, MD;  Location: [facility];  Service: Orthopedics;  Laterality: Left;  . COLONOSCOPY  06/2012   Dr. Ramon Dredge; repeat 10 years  . UPPER GI ENDOSCOPY N/A 07/24/2012   Hiatus Hernia,normal stomach, normal examined duodenum  . WISDOM TOOTH EXTRACTION      Family History  Problem Relation Age of Onset  . Arthritis Mother   . Asthma Mother   . Diabetes Mother   . Cirrhosis Mother        died of liver nonalcoholic cirrhosis  . COPD Mother   . Heart disease Mother        CHF  . Asthma Sister        doesnt know history well  . Drug abuse Sister   . Cancer Father        died of pancreatic  cancer at age 50  . Depression Brother   . Alcohol abuse Brother   . Stroke Maternal Aunt        died age 29yo of CVA  . Other Brother        died of MVA  . Other Sister   . Drug abuse Sister        doesnt know history well    Social History   Tobacco Use  . Smoking status: Former Smoker    Packs/day: 1.00    Years: 15.00    Pack years: 15.00    Types: Cigarettes    Last attempt to quit: 06/25/1991    Years since quitting: 26.2  . Smokeless tobacco: Never Used  Substance Use Topics  . Alcohol use: Yes    Alcohol/week: 0.0 oz    Frequency: Never    Comment: margharita    Subjective:  Patient presents with history of right sided flank pain x 2 weeks- feels that pain is radiating from right flank around into lower abdomen; denies any urinary symptoms, nausea or vomiting; saw her orthopedist on March 6 and got a cortisone injection in her right hip due to the persisting pain; opted to go to the ER on Saturday with the pain with concerns that she had a kidney stone; notes that right sided flank pain became so severe she could barely lift her right leg; CT at ER did not show kidney stone; symptoms were felt to be muscular in nature and she was told to follow-up with her PCP; patient is adamant that she be scheduled for MRI today; has not contacted her orthopedist about persisting concerns/ pain; does not think she has had any imaging done of her back- only hip and knees.   Objective:  Vitals:   09/08/17 1057  BP: 128/78  Pulse: 70  Temp: 98.1 F (36.7 C)  TempSrc: Oral  SpO2: 98%  Weight: 173 lb 0.6 oz (78.5 kg)  Height: 5\' 2"  (1.575 m)    General: Well developed, well nourished, in no acute distress  Skin : Warm and dry.  Lungs: Respirations unlabored; clear to auscultation bilaterally without wheeze, rales, rhonchi  Musculoskeletal: No deformities; no active joint inflammation  Extremities: No edema, cyanosis, clubbing  Vessels: Symmetric bilaterally  Neurologic: Alert and  oriented; speech intact; face symmetrical; moves all extremities well; CNII-XII intact without focal deficit   Assessment:  1. Right flank pain     Plan:  Reviewed ER notes from Saturday- reassurance that symptoms are most likely muscular in nature; understand her desire to get MRI done but explained  that this would be better if her orthopedist orders since they are managing her care; she agrees and notes she will contact them for scheduling; she will also follow up with her PCP for continued problems;  No Follow-up on file.  No orders of the defined types were placed in this encounter.   Requested Prescriptions    No prescriptions requested or ordered in this encounter

## 2017-09-13 DIAGNOSIS — M545 Low back pain: Secondary | ICD-10-CM | POA: Diagnosis not present

## 2017-09-13 DIAGNOSIS — M25551 Pain in right hip: Secondary | ICD-10-CM | POA: Diagnosis not present

## 2017-09-19 DIAGNOSIS — M25551 Pain in right hip: Secondary | ICD-10-CM | POA: Diagnosis not present

## 2017-09-27 ENCOUNTER — Other Ambulatory Visit: Payer: Self-pay | Admitting: Family Medicine

## 2017-11-14 ENCOUNTER — Other Ambulatory Visit: Payer: Self-pay | Admitting: Family Medicine

## 2017-11-20 DIAGNOSIS — M25551 Pain in right hip: Secondary | ICD-10-CM | POA: Diagnosis not present

## 2017-12-10 ENCOUNTER — Other Ambulatory Visit: Payer: Self-pay | Admitting: Family Medicine

## 2017-12-12 ENCOUNTER — Other Ambulatory Visit: Payer: Self-pay | Admitting: Family Medicine

## 2017-12-31 ENCOUNTER — Telehealth: Payer: Self-pay | Admitting: Family Medicine

## 2017-12-31 NOTE — Telephone Encounter (Signed)
Copied from CRM [id number]. Topic: Quick Communication - Rx Refill/Question >> Dec 31, 2017  3:05 PM Laural BenesJohnson, Louisianahiquita C wrote: Medication: ezetimibe (ZETIA) 10 MG tablet -- pt says that she has been out of her medication for 2 weeks.   Has the patient contacted their pharmacy? Yes   (Agent: If no, request that the patient contact the pharmacy for the refill.) (Agent: If yes, when and what did the pharmacy advise?)  Preferred Pharmacy (with phone number or street name): [facility] [id number] - [place], [place] - [address] [phone] (Phone) [phone] (Fax)      Agent: Please be advised that RX refills may take up to 3 business days. We ask that you follow-up with your pharmacy.

## 2017-12-31 NOTE — Telephone Encounter (Signed)
Pt requesting refill of Zetia and states she has been out of the mediation for 2 weeks. Pt was started on Crestor weekly on 07/15/17. Does the pt need to take Zetia along with Crestor?  LOV: 07/14/17 Next OV: 01/09/18 PCP: Dr. Matthew SarasHunter  [facility] [place]

## 2018-01-01 ENCOUNTER — Other Ambulatory Visit: Payer: Self-pay

## 2018-01-01 MED ORDER — EZETIMIBE 10 MG PO TABS: ORAL_TABLET | ORAL | 5 refills | Status: DC

## 2018-01-01 NOTE — Telephone Encounter (Signed)
Yes thanks- crestor once a week and zetia daily

## 2018-01-01 NOTE — Telephone Encounter (Signed)
Zetia and Crestor together okay for patient?

## 2018-01-01 NOTE — Telephone Encounter (Signed)
Zetia sent into her pharmacy. Called patient and let her know

## 2018-01-09 ENCOUNTER — Encounter: Payer: Self-pay | Admitting: Family Medicine

## 2018-01-09 ENCOUNTER — Ambulatory Visit: Payer: BLUE CROSS/BLUE SHIELD | Admitting: Family Medicine

## 2018-01-09 DIAGNOSIS — M1990 Unspecified osteoarthritis, unspecified site: Secondary | ICD-10-CM | POA: Diagnosis not present

## 2018-01-09 DIAGNOSIS — F411 Generalized anxiety disorder: Secondary | ICD-10-CM | POA: Diagnosis not present

## 2018-01-09 DIAGNOSIS — J4530 Mild persistent asthma, uncomplicated: Secondary | ICD-10-CM | POA: Diagnosis not present

## 2018-01-09 DIAGNOSIS — E785 Hyperlipidemia, unspecified: Secondary | ICD-10-CM | POA: Diagnosis not present

## 2018-01-09 DIAGNOSIS — K219 Gastro-esophageal reflux disease without esophagitis: Secondary | ICD-10-CM | POA: Diagnosis not present

## 2018-01-09 DIAGNOSIS — I10 Essential (primary) hypertension: Secondary | ICD-10-CM | POA: Diagnosis not present

## 2018-01-09 LAB — BASIC METABOLIC PANEL
BUN: 20 mg/dL (ref 6–23)
CO2: 31 mEq/L (ref 19–32)
CREATININE: 0.74 mg/dL (ref 0.40–1.20)
Calcium: 9.1 mg/dL (ref 8.4–10.5)
Chloride: 105 mEq/L (ref 96–112)
GFR: 86.42 mL/min (ref >60.00)
GLUCOSE: 86 mg/dL (ref 70–99)
POTASSIUM: 4 meq/L (ref 3.5–5.1)
Sodium: 142 mEq/L (ref 135–145)

## 2018-01-09 LAB — LDL CHOLESTEROL, DIRECT: Direct LDL: 171 mg/dL

## 2018-01-09 NOTE — Progress Notes (Signed)
Subjective:  Shannon Mckenzie is a 57 y.o. year old very pleasant female patient who presents for/with See problem oriented charting ROS- continued joint pain. No clear myalgias. No reported chest pain or shortness of breath. No recent asthma flare ups on breo   Past Medical History-  Patient Active Problem List   Diagnosis Date Noted  . Rheumatoid arthritis involving multiple sites with positive rheumatoid factor (HCC) 08/02/2017    Priority: High  . Major depression in full remission (HCC) 05/08/2017    Priority: High  . GERD (gastroesophageal reflux disease) 01/10/2018    Priority: Medium  . Carpal tunnel syndrome 05/26/2017    Priority: Medium  . Former smoker 05/26/2017    Priority: Medium  . GAD (generalized anxiety disorder) 05/08/2017    Priority: Medium  . Osteoarthritis 03/13/2015    Priority: Medium  . Anemia 04/16/2013    Priority: Medium  . Extrinsic asthma, mild persistent, uncomplicated 03/20/2013    Priority: Medium  . Upper airway cough syndrome ? all related to sinus dz 03/19/2013    Priority: Medium  . Essential hypertension, benign 04/15/2012    Priority: Medium  . Hyperlipidemia 04/15/2012    Priority: Medium  . Allergic rhinitis 05/26/2017    Priority: Low  . Iron deficiency 02/20/2016    Priority: Low  . Cough 03/13/2015    Priority: Low  . Back pain 11/10/2014    Priority: Low  . Rash and nonspecific skin eruption 07/21/2014    Priority: Low  . Cold feeling 05/03/2014    Priority: Low  . Sinusitis, chronic 06/14/2013    Priority: Low  . Obesity 04/15/2012    Priority: Low    Medications- reviewed and updated Current Outpatient Medications  Medication Sig Dispense Refill  . albuterol (PROVENTIL HFA;VENTOLIN HFA) 108 (90 Base) MCG/ACT inhaler Inhale 2 puffs into the lungs as needed.    . Cholecalciferol (D3 HIGH POTENCY) 2000 units CAPS Take by mouth.    . diclofenac (VOLTAREN) 75 MG EC tablet Take 1 tablet by mouth 2 (two) times daily.    2  . escitalopram (LEXAPRO) 20 MG tablet TAKE 1 TABLET(20 MG) BY MOUTH DAILY 30 tablet 5  . esomeprazole (NEXIUM) 40 MG capsule Take 1 capsule (40 mg total) by mouth 2 (two) times daily. 180 capsule 3  . ezetimibe (ZETIA) 10 MG tablet TAKE 1 TABLET(10 MG) BY MOUTH DAILY 30 tablet 5  . ferrous fumarate (HEMOCYTE - 106 MG FE) 325 (106 FE) MG TABS tablet Take 1 tablet by mouth.    . fluticasone furoate-vilanterol (BREO ELLIPTA) 100-25 MCG/INH AEPB Inhale 1 puff into the lungs daily. 60 g 1 each 0  . HYDROcodone-acetaminophen (NORCO) 5-325 MG tablet Take 1-2 tablets by mouth every 6 (six) hours as needed. 15 tablet 0  . losartan (COZAAR) 50 MG tablet TAKE 1 TABLET(50 MG) BY MOUTH DAILY 90 tablet 1  . Melatonin 5 MG TABS Take by mouth.    . montelukast (SINGULAIR) 10 MG tablet TAKE 1 TABLET BY MOUTH EVERY NIGHT AT BEDTIME 90 tablet 3  . rosuvastatin (CRESTOR) 10 MG tablet Take 1 tablet (10 mg total) by mouth once a week. 12 tablet 3   No current facility-administered medications for this visit.    Objective: BP 140/88 (BP Location: Left Arm, Patient Position: Sitting, Cuff Size: Normal)   Pulse 70   Temp 97.8 F (36.6 C) (Oral)   Ht 5\' 2"  (1.575 m)   Wt 188 lb 12.8 oz (85.6 kg)  SpO2 98%   BMI 34.53 kg/m  Gen: NAD, resting comfortably CV: RRR no murmurs rubs or gallops Lungs: CTAB no crackles, wheeze, rhonchi Abdomen: soft/nontender/nondistended/normal bowel sounds. No rebound or guarding.  Ext: no edema Skin: warm, dry  Assessment/Plan: Social History   Social History Narrative   Married, 1 biological son, 1 adopted son.        Faith: Christian. Denies any religious beliefs that would effect health care.       Cleans homes for a living.       Fun: Watch movies, be outside and work in the yard, [employer] operational officer    Other notes: 1. remains on iron given history of anemia- prior GI workup unrevealing.   2. Able to complete 4 mets without chest pain or shortness of breath - wouldn't  need further cardiac clearance. Asthma well controlled 3. Watch GFR on continued nsaids  Osteoarthritis S: severe hip arthritis and some degenerative findings on MRI- hurting in both knees, both hips, and low back. Saw rheumatology who thought likely all OA.  To scan in. From Dr. Orie Fisherman. She needs right hip replacement to start- needs to see nutritionist to work on weight loss first. On diclofenac A/P: continue to Follow with Dr. Madelon Lips- tried to encourage patient as this has been a difficult time from her. She would potentially like to get a second opinion on surgery. She is also interested in pursuing disability due to her pain- I told her that would be a good discussion with orthopedics given her claim for disability is [employer] related.   Hyperlipidemia S: poorly controlled on zetia alone. Last visit we advised to add rosuvastatin 10mg  once a week. She has had myalgias with all trials of daily statins Lab Results  Component Value Date   CHOL 251 (H) 07/14/2017   HDL 45.40 07/14/2017   LDLCALC 181 (H) 07/14/2017   LDLDIRECT 171.0 01/09/2018   TRIG 126.0 07/14/2017   CHOLHDL 6 07/14/2017   A/P: LDL improved by 10 on rosuvastatin once a week- will see if she can tolerate going to twice a week  Essential hypertension, benign S: controlled on  losartan 50mg  in past- didn't take meds yet this AM BP Readings from Last 3 Encounters:  01/09/18 140/88  09/08/17 128/78  09/07/17 130/77  A/P: We discussed blood pressure goal of <140/90. Continue current meds- encouraged her to take meds as soon as she gets home and take meds before next visit  Extrinsic asthma, mild persistent, uncomplicated S: remains on breo and singulair still. Rare albuterol use A/P: she has been really stable, does nto want to make adjustments  GERD (gastroesophageal reflux disease) S: she is on nexium 40mg  BID. Dr. Randa Evens of GI told her to take twice a day- never had GI bleed.  A/P: encouraged her to try once a day  nexium- can go back up if recurrence of reflux but would want her to see GI at least every 2 years or so to make sure they are ok with it  GAD (generalized anxiety disorder) controlled on lexapro 20mg . PHQ9 of 0 today- screened for depression particularly with chronic pain. She reports anxiety is well c ontrolled  Future Appointments  Date Time Provider Department Center  07/16/2018  9:30 AM [name], MD [facility]   Lab/Order associations: Hyperlipidemia, unspecified hyperlipidemia type - Plan: Basic metabolic panel, LDL cholesterol, direct  Osteoarthritis, unspecified osteoarthritis type, unspecified site  Essential hypertension, benign  Extrinsic asthma, mild persistent, uncomplicated  Gastroesophageal reflux disease  without esophagitis  GAD (generalized anxiety disorder)  Return precautions advised.  Tana ConchStephen Hunter, MD

## 2018-01-09 NOTE — Patient Instructions (Addendum)
Health Maintenance Due  Topic Date Due  . MAMMOGRAM - Our team will [facility] 12/13/2017   Please stop by the lab before you go.  Try to take BP meds before visit  If bad cholesterol over 160 still may trial twice a week rosuvastatin   Glad you are following up with Dr. Madelon Lipsaffrey so closely

## 2018-01-10 DIAGNOSIS — K219 Gastro-esophageal reflux disease without esophagitis: Secondary | ICD-10-CM | POA: Insufficient documentation

## 2018-01-10 NOTE — Assessment & Plan Note (Signed)
S: severe hip arthritis and some degenerative findings on MRI- hurting in both knees, both hips, and low back. Saw rheumatology who thought likely all OA.  To scan in. From Dr. Orie FishermanMurphey. She needs right hip replacement to start- needs to see nutritionist to work on weight loss first. On diclofenac A/P: continue to Follow with Dr. Madelon Lipsaffrey- tried to encourage patient as this has been a difficult time from her. She would potentially like to get a second opinion on surgery. She is also interested in pursuing disability due to her pain- I told her that would be a good discussion with orthopedics given her claim for disability is [employer] related.

## 2018-01-10 NOTE — Assessment & Plan Note (Signed)
controlled on lexapro 20mg . PHQ9 of 0 today- screened for depression particularly with chronic pain. She reports anxiety is well c ontrolled

## 2018-01-10 NOTE — Assessment & Plan Note (Signed)
S: she is on nexium 40mg  BID. Dr. Randa EvensEdwards of GI told her to take twice a day- never had GI bleed.  A/P: encouraged her to try once a day nexium- can go back up if recurrence of reflux but would want her to see GI at least every 2 years or so to make sure they are ok with it

## 2018-01-10 NOTE — Assessment & Plan Note (Signed)
S: remains on breo and singulair still. Rare albuterol use A/P: she has been really stable, does nto want to make adjustments

## 2018-01-10 NOTE — Assessment & Plan Note (Signed)
S: poorly controlled on zetia alone. Last visit we advised to add rosuvastatin 10mg  once a week. She has had myalgias with all trials of daily statins Lab Results  Component Value Date   CHOL 251 (H) 07/14/2017   HDL 45.40 07/14/2017   LDLCALC 181 (H) 07/14/2017   LDLDIRECT 171.0 01/09/2018   TRIG 126.0 07/14/2017   CHOLHDL 6 07/14/2017   A/P: LDL improved by 10 on rosuvastatin once a week- will see if she can tolerate going to twice a week

## 2018-01-10 NOTE — Progress Notes (Signed)
Your cholesterol has improved by 10 points on bad cholesterol! That's great news. Do you think you could tolerate taking the cholesterol medicine twice a week? If so our team can send this in for you.   Your BMET was normal (kidney, electrolytes, blood sugar).

## 2018-01-10 NOTE — Assessment & Plan Note (Signed)
S: controlled on  losartan 50mg  in past- didn't take meds yet this AM BP Readings from Last 3 Encounters:  01/09/18 140/88  09/08/17 128/78  09/07/17 130/77  A/P: We discussed blood pressure goal of <140/90. Continue current meds- encouraged her to take meds as soon as she gets home and take meds before next visit

## 2018-01-12 ENCOUNTER — Other Ambulatory Visit: Payer: Self-pay

## 2018-01-12 ENCOUNTER — Other Ambulatory Visit: Payer: Self-pay | Admitting: Family Medicine

## 2018-01-12 DIAGNOSIS — M25551 Pain in right hip: Secondary | ICD-10-CM | POA: Diagnosis not present

## 2018-01-12 DIAGNOSIS — M25561 Pain in right knee: Secondary | ICD-10-CM | POA: Diagnosis not present

## 2018-01-12 MED ORDER — ROSUVASTATIN CALCIUM 10 MG PO TABS: 10.0000 mg | ORAL_TABLET | ORAL | 3 refills | Status: DC

## 2018-02-05 DIAGNOSIS — Z130 Encounter for screening for diseases of the blood and blood-forming organs and certain disorders involving the immune mechanism: Secondary | ICD-10-CM | POA: Diagnosis not present

## 2018-02-05 DIAGNOSIS — Z780 Asymptomatic menopausal state: Secondary | ICD-10-CM | POA: Diagnosis not present

## 2018-02-05 DIAGNOSIS — Z6832 Body mass index (BMI) 32.0-32.9, adult: Secondary | ICD-10-CM | POA: Diagnosis not present

## 2018-02-05 DIAGNOSIS — Z1231 Encounter for screening mammogram for malignant neoplasm of breast: Secondary | ICD-10-CM | POA: Diagnosis not present

## 2018-02-05 DIAGNOSIS — Z1389 Encounter for screening for other disorder: Secondary | ICD-10-CM | POA: Diagnosis not present

## 2018-02-05 DIAGNOSIS — N393 Stress incontinence (female) (male): Secondary | ICD-10-CM | POA: Diagnosis not present

## 2018-02-05 DIAGNOSIS — Z01419 Encounter for gynecological examination (general) (routine) without abnormal findings: Secondary | ICD-10-CM | POA: Diagnosis not present

## 2018-02-05 LAB — HM MAMMOGRAPHY

## 2018-02-11 ENCOUNTER — Other Ambulatory Visit: Payer: Self-pay | Admitting: Family Medicine

## 2018-02-12 ENCOUNTER — Encounter (INDEPENDENT_AMBULATORY_CARE_PROVIDER_SITE_OTHER): Payer: Self-pay

## 2018-02-19 ENCOUNTER — Ambulatory Visit (INDEPENDENT_AMBULATORY_CARE_PROVIDER_SITE_OTHER): Payer: BLUE CROSS/BLUE SHIELD | Admitting: Family Medicine

## 2018-02-19 ENCOUNTER — Encounter (INDEPENDENT_AMBULATORY_CARE_PROVIDER_SITE_OTHER): Payer: Self-pay | Admitting: Family Medicine

## 2018-02-19 VITALS — BP 144/86 | HR 84 | Temp 97.9°F | Ht 60.0 in | Wt 185.0 lb

## 2018-02-19 DIAGNOSIS — Z1331 Encounter for screening for depression: Secondary | ICD-10-CM | POA: Diagnosis not present

## 2018-02-19 DIAGNOSIS — R5383 Other fatigue: Secondary | ICD-10-CM | POA: Diagnosis not present

## 2018-02-19 DIAGNOSIS — Z9189 Other specified personal risk factors, not elsewhere classified: Secondary | ICD-10-CM

## 2018-02-19 DIAGNOSIS — E559 Vitamin D deficiency, unspecified: Secondary | ICD-10-CM | POA: Diagnosis not present

## 2018-02-19 DIAGNOSIS — E639 Nutritional deficiency, unspecified: Secondary | ICD-10-CM | POA: Diagnosis not present

## 2018-02-19 DIAGNOSIS — I10 Essential (primary) hypertension: Secondary | ICD-10-CM | POA: Diagnosis not present

## 2018-02-19 DIAGNOSIS — E7849 Other hyperlipidemia: Secondary | ICD-10-CM

## 2018-02-19 DIAGNOSIS — Z0289 Encounter for other administrative examinations: Secondary | ICD-10-CM

## 2018-02-19 DIAGNOSIS — R0602 Shortness of breath: Secondary | ICD-10-CM | POA: Diagnosis not present

## 2018-02-19 DIAGNOSIS — Z6836 Body mass index (BMI) 36.0-36.9, adult: Secondary | ICD-10-CM

## 2018-02-20 LAB — COMPREHENSIVE METABOLIC PANEL
ALBUMIN: 4.4 g/dL (ref 3.5–5.5)
ALT: 19 IU/L (ref 0–32)
AST: 21 IU/L (ref 0–40)
Albumin/Globulin Ratio: 2 (ref 1.2–2.2)
Alkaline Phosphatase: 114 IU/L (ref 39–117)
BILIRUBIN TOTAL: 0.4 mg/dL (ref 0.0–1.2)
BUN / CREAT RATIO: 32 — AB (ref 9–23)
BUN: 25 mg/dL — AB (ref 6–24)
CALCIUM: 9.5 mg/dL (ref 8.7–10.2)
CHLORIDE: 101 mmol/L (ref 96–106)
CO2: 26 mmol/L (ref 20–29)
CREATININE: 0.77 mg/dL (ref 0.57–1.00)
GFR, EST AFRICAN AMERICAN: 101 mL/min/{1.73_m2} (ref >59)
GFR, EST NON AFRICAN AMERICAN: 87 mL/min/{1.73_m2} (ref >59)
GLUCOSE: 71 mg/dL (ref 65–99)
Globulin, Total: 2.2 g/dL (ref 1.5–4.5)
Potassium: 4.3 mmol/L (ref 3.5–5.2)
Sodium: 142 mmol/L (ref 134–144)
TOTAL PROTEIN: 6.6 g/dL (ref 6.0–8.5)

## 2018-02-20 LAB — CBC WITH DIFFERENTIAL
BASOS ABS: 0 10*3/uL (ref 0.0–0.2)
Basos: 0 %
EOS (ABSOLUTE): 0.5 10*3/uL — ABNORMAL HIGH (ref 0.0–0.4)
Eos: 9 %
HEMOGLOBIN: 13.7 g/dL (ref 11.1–15.9)
Hematocrit: 42 % (ref 34.0–46.6)
IMMATURE GRANS (ABS): 0 10*3/uL (ref 0.0–0.1)
Immature Granulocytes: 0 %
LYMPHS: 30 %
Lymphocytes Absolute: 1.6 10*3/uL (ref 0.7–3.1)
MCH: 29.7 pg (ref 26.6–33.0)
MCHC: 32.6 g/dL (ref 31.5–35.7)
MCV: 91 fL (ref 79–97)
MONOCYTES: 7 %
Monocytes Absolute: 0.4 10*3/uL (ref 0.1–0.9)
NEUTROS ABS: 2.7 10*3/uL (ref 1.4–7.0)
Neutrophils: 54 %
RBC: 4.62 x10E6/uL (ref 3.77–5.28)
RDW: 13.3 % (ref 12.3–15.4)
WBC: 5.1 10*3/uL (ref 3.4–10.8)

## 2018-02-20 LAB — VITAMIN B12: Vitamin B-12: 646 pg/mL (ref 232–1245)

## 2018-02-20 LAB — LIPID PANEL WITH LDL/HDL RATIO
CHOLESTEROL TOTAL: 273 mg/dL — AB (ref 100–199)
HDL: 51 mg/dL (ref >39)
LDL Calculated: 197 mg/dL — ABNORMAL HIGH (ref 0–99)
LDl/HDL Ratio: 3.9 ratio — ABNORMAL HIGH (ref 0.0–3.2)
TRIGLYCERIDES: 123 mg/dL (ref 0–149)
VLDL CHOLESTEROL CAL: 25 mg/dL (ref 5–40)

## 2018-02-20 LAB — HEMOGLOBIN A1C
ESTIMATED AVERAGE GLUCOSE: 100 mg/dL
Hgb A1c MFr Bld: 5.1 % (ref 4.8–5.6)

## 2018-02-20 LAB — TSH: TSH: 2.24 u[IU]/mL (ref 0.450–4.500)

## 2018-02-20 LAB — VITAMIN D 25 HYDROXY (VIT D DEFICIENCY, FRACTURES): Vit D, 25-Hydroxy: 43.6 ng/mL (ref 30.0–100.0)

## 2018-02-20 LAB — T4, FREE: Free T4: 1.31 ng/dL (ref 0.82–1.77)

## 2018-02-20 LAB — FOLATE: Folate: 5.7 ng/mL (ref >3.0)

## 2018-02-20 LAB — INSULIN, RANDOM: INSULIN: 5.8 u[IU]/mL (ref 2.6–24.9)

## 2018-02-20 LAB — T3: T3, Total: 107 ng/dL (ref 71–180)

## 2018-02-24 ENCOUNTER — Telehealth: Payer: Self-pay | Admitting: Family Medicine

## 2018-02-24 NOTE — Telephone Encounter (Signed)
Copied from CRM [id number]. Topic: General - Other >> Feb 24, 2018 10:40 AM Percival Spanish wrote:  Pt call to ask if Dr Durene Cal will RX her  prednisone for arthritis flair up.   Pharmacy [facility] on [address]

## 2018-02-24 NOTE — Progress Notes (Signed)
Office: [phone]  /  Fax: [phone]   Dear Dr. Madelon Lips,   Thank you for referring Shannon Mckenzie to our clinic. The following note includes my evaluation and treatment recommendations.  HPI:   Chief Complaint: OBESITY    Shannon Mckenzie has been referred by W. Frederico Hamman, MD for consultation regarding her obesity and obesity related comorbidities.    Shannon Mckenzie (MR# [id number]) is a 57 y.o. female who presents on 02/19/2018 for obesity evaluation and treatment. Current BMI is Body mass index is 36.13 kg/m.Marland Kitchen Shannon Mckenzie has been struggling with her weight for many years and has been unsuccessful in either losing weight, maintaining weight loss, or reaching her healthy weight goal.     Shannon Mckenzie attended our information session and states she is currently in the action stage of change and ready to dedicate time achieving and maintaining a healthier weight. Shannon Mckenzie is interested in becoming our patient and working on intensive lifestyle modifications including (but not limited to) diet, exercise and weight loss.    Shannon Mckenzie states her family eats meals together she thinks her family will eat healthier with  her her desired weight loss is 55 lbs she started gaining weight in her 40's her heaviest weight ever was 185 lbs she is a picky eater and doesn't like to eat healthier foods  she has significant food cravings issues  she snacks frequently in the evenings she skips meals frequently she is frequently drinking liquids with calories she has problems with excessive hunger  she frequently eats larger portions than normal  she struggles with emotional eating    Fatigue Shannon Mckenzie feels her energy is lower than it should be. This has worsened with weight gain and has not worsened recently. Female admits to daytime somnolence and  admits to waking up still tired. Patient is at risk for obstructive sleep apnea. Patent has a history of symptoms of daytime fatigue.  Patient generally gets 8 hours of sleep per night, and states they generally have generally restful sleep. Snoring is present. Apneic episodes are not present. Epworth Sleepiness Score is 11.  Dyspnea on exertion Shannon Mckenzie notes increasing shortness of breath with exercising and seems to be worsening over time with weight gain. She notes getting out of breath sooner with activity than she used to. This has not gotten worse recently. EKG-normal sinus rhythm. [name] denies orthopnea.  Hyperlipidemia Shannon Mckenzie has hyperlipidemia and has been trying to improve her cholesterol levels with intensive lifestyle modification including a low saturated fat diet, exercise and weight loss. Elevated LDL shown on previous labs and she is on statin and Zetia. She denies any chest pain, claudication or myalgias.  Hypertension Shannon Mckenzie is a 57 y.o. female with hypertension. Eulia's blood pressure is slightly elevated today. She denies chest pain, chest pressure, or headache. She is working weight loss to help control her blood pressure with the goal of decreasing her risk of heart attack and stroke. [name]'s blood pressure is not currently controlled.  At risk for cardiovascular disease Shannon Mckenzie is at a higher than average risk for cardiovascular disease due to obesity, hyperlipidemia, and hypertension. She currently denies any chest pain.  Depression Screen Shannon Mckenzie's Food and Mood (modified PHQ-9) score was  Depression screen PHQ 2/9 02/19/2018  Decreased Interest 2  Down, Depressed, Hopeless 1  PHQ - 2 Score 3  Altered sleeping 1  Tired, decreased energy 3  Change in appetite 2  Feeling bad or failure about yourself  1  Trouble concentrating 1  Moving slowly or fidgety/restless 1  Suicidal thoughts 0  PHQ-9 Score 12  Difficult doing work/chores -    ALLERGIES: Allergies  Allergen Reactions  . Statins     Myalgias with all statins  . Amoxicillin Hives and Rash     MEDICATIONS: Current Outpatient Medications on File Prior to Visit  Medication Sig Dispense Refill  . albuterol (PROVENTIL HFA;VENTOLIN HFA) 108 (90 Base) MCG/ACT inhaler Inhale 2 puffs into the lungs as needed.    Marland Kitchen BREO ELLIPTA 100-25 MCG/INH AEPB INHALE 1 PUFF INTO THE LUNGS DAILY 60 each 1  . Cholecalciferol (D3 HIGH POTENCY) 2000 units CAPS Take 1,000 Units by mouth.     . diclofenac (VOLTAREN) 75 MG EC tablet Take 1 tablet by mouth 2 (two) times daily.   2  . escitalopram (LEXAPRO) 20 MG tablet Take 20 mg by mouth daily.    Marland Kitchen esomeprazole (NEXIUM) 40 MG capsule Take 1 capsule (40 mg total) by mouth 2 (two) times daily. 180 capsule 3  . ezetimibe (ZETIA) 10 MG tablet TAKE 1 TABLET(10 MG) BY MOUTH DAILY 30 tablet 5  . ferrous fumarate (HEMOCYTE - 106 MG FE) 325 (106 FE) MG TABS tablet Take 1 tablet by mouth.    . fluticasone (FLONASE) 50 MCG/ACT nasal spray Place 1 spray into both nostrils daily.    Marland Kitchen losartan (COZAAR) 50 MG tablet TAKE 1 TABLET(50 MG) BY MOUTH DAILY 90 tablet 1  . Melatonin 5 MG TABS Take by mouth.    . montelukast (SINGULAIR) 10 MG tablet TAKE 1 TABLET BY MOUTH EVERY NIGHT AT BEDTIME 90 tablet 3  . rosuvastatin (CRESTOR) 10 MG tablet Take 1 tablet (10 mg total) by mouth 2 (two) times a week. 26 tablet 3  . tolterodine (DETROL) 2 MG tablet Take 2 mg by mouth once.    . traMADol (ULTRAM) 50 MG tablet Take by mouth every 6 (six) hours as needed.    . TURMERIC PO Take by mouth.     No current facility-administered medications on file prior to visit.     PAST MEDICAL HISTORY: Past Medical History:  Diagnosis Date  . Allergy   . Anemia   . Anxiety   . Asthma   . Depression   . DJD (degenerative joint disease) of hip   . Former smoker   . GERD (gastroesophageal reflux disease)   . Hand pain, right   . Hiatal hernia   . History of kidney stones    history  . Hyperlipidemia   . Hypertension   . Kidney stone   . Osteoarthritis   . Pain, hip    right  .  Pneumonia    hx but no hospitalization  . Reactive airway disease   . RLS (restless legs syndrome)   . Routine gynecological examination    sees gynecology, [name] NP  . SOB (shortness of breath) 9/14   cardiac eval with stress test and echo; Dr. [name]  . Statin intolerance   . Vision problems    wears contact lenses    PAST SURGICAL HISTORY: Past Surgical History:  Procedure Laterality Date  . CARPAL TUNNEL RELEASE Left 07/03/2017   Procedure: LEFT CARPAL TUNNEL RELEASE;  Surgeon: Betha Loa, MD;  Location: [facility];  Service: Orthopedics;  Laterality: Left;  . COLONOSCOPY  06/2012   Dr. Ramon Dredge; repeat 10 years  . UPPER GI ENDOSCOPY N/A 07/24/2012   [place] Hernia,normal stomach, normal examined duodenum  . WISDOM TOOTH EXTRACTION  SOCIAL HISTORY: Social History   Tobacco Use  . Smoking status: Former Smoker    Packs/day: 1.00    Years: 15.00    Pack years: 15.00    Types: Cigarettes    Last attempt to quit: 06/25/1991    Years since quitting: 26.6  . Smokeless tobacco: Never Used  Substance Use Topics  . Alcohol use: Yes    Alcohol/week: 0.0 standard drinks    Frequency: Never    Comment: margharita  . Drug use: No    FAMILY HISTORY: Family History  Problem Relation Age of Onset  . Arthritis Mother   . Asthma Mother   . Diabetes Mother   . Cirrhosis Mother        died of liver nonalcoholic cirrhosis  . COPD Mother   . Heart disease Mother        CHF  . Hypertension Mother   . Kidney disease Mother   . Liver disease Mother   . Eating disorder Mother   . Obesity Mother   . Asthma Sister        doesnt know history well  . Drug abuse Sister   . Cancer Father        died of pancreatic cancer at age 88  . Alcohol abuse Father   . Depression Brother   . Alcohol abuse Brother   . Stroke Maternal Aunt        died age 39yo of CVA  . Other Brother        died of MVA  . Other Sister   . Drug abuse Sister        doesnt know  history well    ROS: Review of Systems  Constitutional: Positive for malaise/fatigue. Negative for weight loss.       + Trouble sleeping  HENT: Positive for sinus pain.        + Decreased hearing + Nasal stuffiness + Dry mouth  Eyes:       + Vision changes + Wear glasses or contacts  Respiratory: Positive for shortness of breath (with exertion) and wheezing.   Cardiovascular: Negative for chest pain, orthopnea and claudication.       Negative chest pressure + Calf/leg pain with walking + Leg cramping  Gastrointestinal: Positive for constipation, diarrhea and heartburn.  Musculoskeletal: Positive for back pain. Negative for myalgias.       + Muscle or joint pain + Muscle stiffness  Skin:       + Dryness + Hair or nail changes  Neurological: Positive for weakness. Negative for headaches.  Endo/Heme/Allergies: Bruises/bleeds easily.  Psychiatric/Behavioral: Positive for depression. Negative for suicidal ideas.       + Stress    PHYSICAL EXAM: Blood pressure (!) 144/86, pulse 84, temperature 97.9 F (36.6 C), temperature source Oral, height 5' (1.524 m), weight 185 lb (83.9 kg), last menstrual period 03/13/2015, SpO2 100 %. Body mass index is 36.13 kg/m. Physical Exam  Constitutional: She is oriented to person, place, and time. She appears well-developed and well-nourished.  HENT:  Head: Normocephalic and atraumatic.  Nose: Nose normal.  Eyes: EOM are normal. No scleral icterus.  Neck: Normal range of motion. Neck supple. No thyromegaly present.  Cardiovascular: Normal rate and regular rhythm.  Murmur (1/6 systolic murmur heard best at aortic area) heard. Pulmonary/Chest: Effort normal. No respiratory distress.  Abdominal: Soft. There is no tenderness.  + Obesity  Musculoskeletal:  Range of Motion normal in all 4 extremities Trace edema noted in bilateral  lower extremities  Neurological: She is alert and oriented to person, place, and time. Coordination normal.  Skin:  Skin is warm and dry.  Psychiatric: She has a normal mood and affect. Her behavior is normal.  Vitals reviewed.   RECENT LABS AND TESTS: BMET    Component Value Date/Time   NA 142 02/19/2018 1207   K 4.3 02/19/2018 1207   CL 101 02/19/2018 1207   CO2 26 02/19/2018 1207   GLUCOSE 71 02/19/2018 1207   GLUCOSE 86 01/09/2018 0958   BUN 25 (H) 02/19/2018 1207   CREATININE 0.77 02/19/2018 1207   CREATININE 0.63 06/29/2013 1000   CALCIUM 9.5 02/19/2018 1207   GFRNONAA 87 02/19/2018 1207   GFRAA 101 02/19/2018 1207   Lab Results  Component Value Date   HGBA1C 5.1 02/19/2018   Lab Results  Component Value Date   INSULIN 5.8 02/19/2018   CBC    Component Value Date/Time   WBC 5.1 02/19/2018 1207   WBC 8.8 09/06/2017 1956   RBC 4.62 02/19/2018 1207   RBC 4.65 09/06/2017 1956   HGB 13.7 02/19/2018 1207   HCT 42.0 02/19/2018 1207   PLT 264 09/06/2017 1956   MCV 91 02/19/2018 1207   MCH 29.7 02/19/2018 1207   MCH 30.1 09/06/2017 1956   MCHC 32.6 02/19/2018 1207   MCHC 33.1 09/06/2017 1956   RDW 13.3 02/19/2018 1207   LYMPHSABS 1.6 02/19/2018 1207   MONOABS 0.4 10/25/2016 1201   EOSABS 0.5 (H) 02/19/2018 1207   BASOSABS 0.0 02/19/2018 1207   Iron/TIBC/Ferritin/ %Sat    Component Value Date/Time   IRON 117 02/20/2016 1018   TIBC 378 05/03/2014 1009   FERRITIN 5.7 (L) 05/03/2014 1009   IRONPCTSAT 38.0 02/20/2016 1018   IRONPCTSAT 9 (L) 05/03/2014 1009   Lipid Panel     Component Value Date/Time   CHOL 273 (H) 02/19/2018 1207   TRIG 123 02/19/2018 1207   HDL 51 02/19/2018 1207   CHOLHDL 6 07/14/2017 1000   VLDL 25.2 07/14/2017 1000   LDLCALC 197 (H) 02/19/2018 1207   LDLDIRECT 171.0 01/09/2018 0958   Hepatic Function Panel     Component Value Date/Time   PROT 6.6 02/19/2018 1207   ALBUMIN 4.4 02/19/2018 1207   AST 21 02/19/2018 1207   ALT 19 02/19/2018 1207   ALKPHOS 114 02/19/2018 1207   BILITOT 0.4 02/19/2018 1207      Component Value Date/Time   TSH  2.240 02/19/2018 1207   TSH 3.62 01/17/2016 1158   TSH 2.45 05/03/2014 1009    ECG  shows NSR with a rate of 80 BPM INDIRECT CALORIMETER done today shows a VO2 of 164 and a REE of 1145.  Her calculated basal metabolic rate is 0813 thus her basal metabolic rate is worse than expected.    ASSESSMENT AND PLAN: Other fatigue - Plan: EKG 12-Lead, Vitamin B12, CBC With Differential, Comprehensive metabolic panel, Folate, Hemoglobin A1c, Insulin, random, T3, T4, free, TSH, VITAMIN D 25 Hydroxy (Vit-D Deficiency, Fractures)  Shortness of breath on exertion - Plan: CBC With Differential  Other hyperlipidemia - Plan: Lipid Panel With LDL/HDL Ratio  Essential hypertension  Depression screening  At risk for heart disease  Class 2 severe obesity with serious comorbidity and body mass index (BMI) of 36.0 to 36.9 in adult, unspecified obesity type (HCC)  PLAN:  Fatigue Georgina was informed that her fatigue may be related to obesity, depression or many other causes. Labs will be ordered, and in the meanwhile  Zaliah has agreed to work on diet, exercise and weight loss to help with fatigue. Proper sleep hygiene was discussed including the need for 7-8 hours of quality sleep each night. A sleep study was not ordered based on symptoms and Epworth score.  Dyspnea on exertion Cassadi's shortness of breath appears to be obesity related and exercise induced. She has agreed to work on weight loss and gradually increase exercise to treat her exercise induced shortness of breath. If Hilda follows our instructions and loses weight without improvement of her shortness of breath, we will plan to refer to pulmonology. We will monitor this condition regularly. Rayne agrees to this plan.  Hyperlipidemia Janera was informed of the American Heart Association Guidelines emphasizing intensive lifestyle modifications as the first line treatment for hyperlipidemia. We discussed many lifestyle  modifications today in depth, and Promiss will continue to work on decreasing saturated fats such as fatty red meat, butter and many fried foods. She will also increase vegetables and lean protein in her diet and continue to work on exercise and weight loss efforts. We will check labs today and Paulena agrees to follow up with our clinic in 2 weeks.  Hypertension We discussed sodium restriction, working on healthy weight loss, and a regular exercise program as the means to achieve improved blood pressure control. Nelda agreed with this plan and agreed to follow up as directed. We will continue to monitor her blood pressure as well as her progress with the above lifestyle modifications. Yarah will continue her medications and will watch for signs of hypotension as she continues her lifestyle modifications. EKG was done today, we will check labs, and will follow up on blood pressure at next appointment. Julieanne agrees to follow up with our clinic in 2 weeks.  Cardiovascular risk counselling Willamina was given extended (15 minutes) coronary artery disease prevention counseling today. She is 57 y.o. female and has risk factors for heart disease including obesity, hyperlipidemia, and hypertension. We discussed intensive lifestyle modifications today with an emphasis on specific weight loss instructions and strategies. Pt was also informed of the importance of increasing exercise and decreasing saturated fats to help prevent heart disease.  Depression Screen Cecil had a moderately positive depression screening. Depression is commonly associated with obesity and often results in emotional eating behaviors. We will monitor this closely and work on CBT to help improve the non-hunger eating patterns. Referral to Psychology may be required if no improvement is seen as she continues in our clinic.  Obesity Loreta is currently in the action stage of change and her goal is to continue with weight  loss efforts. I recommend Presly begin the structured treatment plan as follows:  She has agreed to follow the Category 1 plan Brandee has been instructed to eventually work up to a goal of 150 minutes of combined cardio and strengthening exercise per week for weight loss and overall health benefits. We discussed the following Behavioral Modification Strategies today: increasing lean protein intake and work on meal planning and easy cooking plans   She was informed of the importance of frequent follow up visits to maximize her success with intensive lifestyle modifications for her multiple health conditions. She was informed we would discuss her lab results at her next visit unless there is a critical issue that needs to be addressed sooner. Megham agreed to keep her next visit at the agreed upon time to discuss these results.    OBESITY BEHAVIORAL INTERVENTION VISIT  Today's visit was # 1  Starting weight: 185 lbs Starting date: 02/19/18 Today's weight : 185 lbs  Today's date: 02/19/2018 Total lbs lost to date: 0 At least 15 minutes were spent on discussing the following behavioral intervention visit.   ASK: We discussed the diagnosis of obesity with Johnna Acosta today and Britten agreed to give [place] permission to discuss obesity behavioral modification therapy today.  ASSESS: Dennys has the diagnosis of obesity and her BMI today is 36.13 Marietta is in the action stage of change   ADVISE: Larisa was educated on the multiple health risks of obesity as well as the benefit of weight loss to improve her health. She was advised of the need for long term treatment and the importance of lifestyle modifications to improve her current health and to decrease her risk of future health problems.  AGREE: Multiple dietary modification options and treatment options were discussed and  Danylah agreed to follow the recommendations documented in the above  note.  ARRANGE: Karlissa was educated on the importance of frequent visits to treat obesity as outlined per CMS and USPSTF guidelines and agreed to schedule her next follow up appointment today.  I, Burt Knack, am acting as transcriptionist for Debbra Riding, MD   I have reviewed the above documentation for accuracy and completeness, and I agree with the above. - Debbra Riding, MD

## 2018-02-24 NOTE — Telephone Encounter (Signed)
Please see message and advise 

## 2018-02-24 NOTE — Telephone Encounter (Signed)
Blood pressure was high last visit so I would suggest visit to make sure BP ok before starting prednisone. Can we help her get in for a visit this week?

## 2018-02-25 NOTE — Telephone Encounter (Signed)
°  Not able to make pt an appt do to Dr Durene Cal only having same says. Pt is aware she need an appt and would still like a call back from the office    [phone]

## 2018-03-02 DIAGNOSIS — M25552 Pain in left hip: Secondary | ICD-10-CM | POA: Diagnosis not present

## 2018-03-03 ENCOUNTER — Other Ambulatory Visit: Payer: Self-pay | Admitting: Family Medicine

## 2018-03-05 ENCOUNTER — Ambulatory Visit (INDEPENDENT_AMBULATORY_CARE_PROVIDER_SITE_OTHER): Payer: Self-pay | Admitting: Family Medicine

## 2018-03-10 ENCOUNTER — Telehealth: Payer: Self-pay

## 2018-03-10 NOTE — Telephone Encounter (Signed)
Prior Authorization submitted for BREO ELLIPTA 100-25 MCG/INH AEPB. Pending at this time

## 2018-03-16 ENCOUNTER — Ambulatory Visit (INDEPENDENT_AMBULATORY_CARE_PROVIDER_SITE_OTHER): Payer: BLUE CROSS/BLUE SHIELD | Admitting: Family Medicine

## 2018-03-16 VITALS — BP 106/72 | HR 66 | Temp 97.9°F | Ht 60.0 in | Wt 176.0 lb

## 2018-03-16 DIAGNOSIS — E7849 Other hyperlipidemia: Secondary | ICD-10-CM | POA: Diagnosis not present

## 2018-03-16 DIAGNOSIS — Z6834 Body mass index (BMI) 34.0-34.9, adult: Secondary | ICD-10-CM

## 2018-03-16 DIAGNOSIS — E559 Vitamin D deficiency, unspecified: Secondary | ICD-10-CM | POA: Diagnosis not present

## 2018-03-16 DIAGNOSIS — I10 Essential (primary) hypertension: Secondary | ICD-10-CM | POA: Diagnosis not present

## 2018-03-16 DIAGNOSIS — Z9189 Other specified personal risk factors, not elsewhere classified: Secondary | ICD-10-CM | POA: Diagnosis not present

## 2018-03-16 DIAGNOSIS — E669 Obesity, unspecified: Secondary | ICD-10-CM

## 2018-03-16 MED ORDER — VITAMIN D (ERGOCALCIFEROL) 1.25 MG (50000 UNIT) PO CAPS: 50000.0000 [IU] | ORAL_CAPSULE | ORAL | 0 refills | Status: DC

## 2018-03-17 NOTE — Progress Notes (Signed)
Office: [phone]  /  Fax: [phone]   HPI:   Chief Complaint: OBESITY Shannon Mckenzie is here to discuss her progress with her obesity treatment plan. She is on the  follow the Category 1 plan and is following her eating plan approximately 80 % of the time. She states she is exercising 0 minutes 0 times per week. Shannon Mckenzie not struggling with hunger. Realized she had previously done a good deal of mindless snacking. She even went on vacation.   Her weight is 176 lb (79.8 kg) today and has had a weight loss of 9 pounds over a period of 3 weeks since her last visit. She has lost 9 lbs since starting treatment with [place].  Hyperlipidemia Shannon Mckenzie has hyperlipidemia and has been trying to improve her cholesterol levels with intensive lifestyle modification including a low saturated fat diet, exercise and weight loss. She denies any chest pain, claudication or myalgias. Her most recent LDL is 197 (previous over 295). Patient stopped statin secondary to myalgias.   Vitamin D deficiency Shannon Mckenzie has a diagnosis of vitamin D deficiency. She is not currently taking vit D and denies nausea, vomiting or muscle weakness. She is currently on OTC of Vit d 1k/day. Last level was 43.6  Hypertension Shannon Mckenzie is a 57 y.o. female with hypertension.  Shannon Mckenzie denies chest pain or shortness of breath on exertion. She is working weight loss to help control her blood pressure with the goal of decreasing her risk of heart attack and stroke. Kathys blood pressure is currently controlled. Blood pressure significantly improved from previous visit, without dizziness or lightheaded.   At risk for osteopenia and osteoporosis Shannon Mckenzie is at higher risk of osteopenia and osteoporosis due to vitamin D deficiency.    ALLERGIES: Allergies  Allergen Reactions  . Statins     Myalgias with all statins  . Amoxicillin Hives and Rash    MEDICATIONS: Current Outpatient Medications on File Prior to Visit  Medication Sig Dispense  Refill  . albuterol (PROVENTIL HFA;VENTOLIN HFA) 108 (90 Base) MCG/ACT inhaler Inhale 2 puffs into the lungs as needed.    Marland Kitchen BREO ELLIPTA 100-25 MCG/INH AEPB INHALE 1 PUFF INTO THE LUNGS DAILY 60 each 2  . Cholecalciferol (D3 HIGH POTENCY) 2000 units CAPS Take 1,000 Units by mouth.     . diclofenac (VOLTAREN) 75 MG EC tablet Take 1 tablet by mouth 2 (two) times daily.   2  . escitalopram (LEXAPRO) 20 MG tablet Take 20 mg by mouth daily.    Marland Kitchen esomeprazole (NEXIUM) 40 MG capsule Take 1 capsule (40 mg total) by mouth 2 (two) times daily. 180 capsule 3  . ezetimibe (ZETIA) 10 MG tablet TAKE 1 TABLET(10 MG) BY MOUTH DAILY 30 tablet 5  . ferrous fumarate (HEMOCYTE - 106 MG FE) 325 (106 FE) MG TABS tablet Take 1 tablet by mouth.    . fluticasone (FLONASE) 50 MCG/ACT nasal spray Place 1 spray into both nostrils daily.    Marland Kitchen losartan (COZAAR) 50 MG tablet TAKE 1 TABLET(50 MG) BY MOUTH DAILY 90 tablet 1  . Melatonin 5 MG TABS Take by mouth.    . montelukast (SINGULAIR) 10 MG tablet TAKE 1 TABLET BY MOUTH EVERY NIGHT AT BEDTIME 90 tablet 3  . tolterodine (DETROL) 2 MG tablet Take 2 mg by mouth once.    . traMADol (ULTRAM) 50 MG tablet Take by mouth every 6 (six) hours as needed.    . TURMERIC PO Take by mouth.  No current facility-administered medications on file prior to visit.     PAST MEDICAL HISTORY: Past Medical History:  Diagnosis Date  . Allergy   . Anemia   . Anxiety   . Asthma   . Depression   . DJD (degenerative joint disease) of hip   . Former smoker   . GERD (gastroesophageal reflux disease)   . Hand pain, right   . Hiatal hernia   . History of kidney stones    history  . Hyperlipidemia   . Hypertension   . Kidney stone   . Osteoarthritis   . Pain, hip    right  . Pneumonia    hx but no hospitalization  . Reactive airway disease   . RLS (restless legs syndrome)   . Routine gynecological examination    sees gynecology, [name] NP  . SOB (shortness of breath) 9/14    cardiac eval with stress test and echo; Dr. [name]  . Statin intolerance   . Vision problems    wears contact lenses    PAST SURGICAL HISTORY: Past Surgical History:  Procedure Laterality Date  . CARPAL TUNNEL RELEASE Left 07/03/2017   Procedure: LEFT CARPAL TUNNEL RELEASE;  Surgeon: Betha Loa, MD;  Location: [facility];  Service: Orthopedics;  Laterality: Left;  . COLONOSCOPY  06/2012   Dr. Ramon Dredge; repeat 10 years  . UPPER GI ENDOSCOPY N/A 07/24/2012   [place] Hernia,normal stomach, normal examined duodenum  . WISDOM TOOTH EXTRACTION      SOCIAL HISTORY: Social History   Tobacco Use  . Smoking status: Former Smoker    Packs/day: 1.00    Years: 15.00    Pack years: 15.00    Types: Cigarettes    Last attempt to quit: 06/25/1991    Years since quitting: 26.7  . Smokeless tobacco: Never Used  Substance Use Topics  . Alcohol use: Yes    Alcohol/week: 0.0 standard drinks    Frequency: Never    Comment: margharita  . Drug use: No    FAMILY HISTORY: Family History  Problem Relation Age of Onset  . Arthritis Mother   . Asthma Mother   . Diabetes Mother   . Cirrhosis Mother        died of liver nonalcoholic cirrhosis  . COPD Mother   . Heart disease Mother        CHF  . Hypertension Mother   . Kidney disease Mother   . Liver disease Mother   . Eating disorder Mother   . Obesity Mother   . Asthma Sister        doesnt know history well  . Drug abuse Sister   . Cancer Father        died of pancreatic cancer at age 29  . Alcohol abuse Father   . Depression Brother   . Alcohol abuse Brother   . Stroke Maternal Aunt        died age 42yo of CVA  . Other Brother        died of MVA  . Other Sister   . Drug abuse Sister        doesnt know history well    ROS: Review of Systems  Constitutional: Positive for weight loss.    PHYSICAL EXAM: Blood pressure 106/72, pulse 66, temperature 97.9 F (36.6 C), temperature source Oral, height 5'  (1.524 m), weight 176 lb (79.8 kg), last menstrual period 03/13/2015, SpO2 97 %. Body mass index is 34.37 kg/m.  Physical Exam  Constitutional: She is oriented to person, place, and time. She appears well-developed and well-nourished.  HENT:  Head: Normocephalic.  Eyes: EOM are normal.  Neck: Normal range of motion.  Musculoskeletal: Normal range of motion.  Neurological: She is alert and oriented to person, place, and time.  Skin: Skin is warm and dry.  Psychiatric: She has a normal mood and affect. Her behavior is normal.    RECENT LABS AND TESTS: BMET    Component Value Date/Time   NA 142 02/19/2018 1207   K 4.3 02/19/2018 1207   CL 101 02/19/2018 1207   CO2 26 02/19/2018 1207   GLUCOSE 71 02/19/2018 1207   GLUCOSE 86 01/09/2018 0958   BUN 25 (H) 02/19/2018 1207   CREATININE 0.77 02/19/2018 1207   CREATININE 0.63 06/29/2013 1000   CALCIUM 9.5 02/19/2018 1207   GFRNONAA 87 02/19/2018 1207   GFRAA 101 02/19/2018 1207   Lab Results  Component Value Date   HGBA1C 5.1 02/19/2018   HGBA1C 5.2 05/03/2014   Lab Results  Component Value Date   INSULIN 5.8 02/19/2018   CBC    Component Value Date/Time   WBC 5.1 02/19/2018 1207   WBC 8.8 09/06/2017 1956   RBC 4.62 02/19/2018 1207   RBC 4.65 09/06/2017 1956   HGB 13.7 02/19/2018 1207   HCT 42.0 02/19/2018 1207   PLT 264 09/06/2017 1956   MCV 91 02/19/2018 1207   MCH 29.7 02/19/2018 1207   MCH 30.1 09/06/2017 1956   MCHC 32.6 02/19/2018 1207   MCHC 33.1 09/06/2017 1956   RDW 13.3 02/19/2018 1207   LYMPHSABS 1.6 02/19/2018 1207   MONOABS 0.4 10/25/2016 1201   EOSABS 0.5 (H) 02/19/2018 1207   BASOSABS 0.0 02/19/2018 1207   Iron/TIBC/Ferritin/ %Sat    Component Value Date/Time   IRON 117 02/20/2016 1018   TIBC 378 05/03/2014 1009   FERRITIN 5.7 (L) 05/03/2014 1009   IRONPCTSAT 38.0 02/20/2016 1018   IRONPCTSAT 9 (L) 05/03/2014 1009   Lipid Panel     Component Value Date/Time   CHOL 273 (H) 02/19/2018 1207     TRIG 123 02/19/2018 1207   HDL 51 02/19/2018 1207   CHOLHDL 6 07/14/2017 1000   VLDL 25.2 07/14/2017 1000   LDLCALC 197 (H) 02/19/2018 1207   LDLDIRECT 171.0 01/09/2018 0958   Hepatic Function Panel     Component Value Date/Time   PROT 6.6 02/19/2018 1207   ALBUMIN 4.4 02/19/2018 1207   AST 21 02/19/2018 1207   ALT 19 02/19/2018 1207   ALKPHOS 114 02/19/2018 1207   BILITOT 0.4 02/19/2018 1207      Component Value Date/Time   TSH 2.240 02/19/2018 1207   TSH 3.62 01/17/2016 1158   TSH 2.45 05/03/2014 1009    ASSESSMENT AND PLAN: Other hyperlipidemia  Vitamin D deficiency - Plan: Vitamin D, Ergocalciferol, (DRISDOL) 50000 units CAPS capsule  At risk for osteoporosis  Essential hypertension  Class 1 obesity with serious comorbidity and body mass index (BMI) of 34.0 to 34.9 in adult, unspecified obesity type  PLAN: Hyperlipidemia Shannon MessierKathy was informed of the American Heart Association Guidelines emphasizing intensive lifestyle modifications as the first line treatment for hyperlipidemia. We discussed many lifestyle modifications today in depth, and Shannon MessierKathy will continue to work on decreasing saturated fats such as fatty red meat, butter and many fried foods. She will also increase vegetables and lean protein in her diet and continue to work on exercise and weight loss efforts. Will follow this  up in 3 months.   Vitamin D Deficiency Shannon Mckenzie was informed that low vitamin D levels contributes to fatigue and are associated with obesity, breast, and colon cancer. She agrees to continue to take prescription Vit D @50 ,000 IU every week and will follow up for routine testing of vitamin D, at least 2-3 times per year. She was informed of the risk of over-replacement of vitamin D and agrees to not increase her dose unless she discusses this with [place] first. Stop the OTC Vit D and start Vit D 50k Units weekly #4 with 0 refills.   Hypertension We discussed sodium restriction, working on healthy  weight loss, and a regular exercise program as the means to achieve improved blood pressure control. Shannon Mckenzie agreed with this plan and agreed to follow up as directed. We will continue to monitor her blood pressure as well as her progress with the above lifestyle modifications. She will continue her medications as prescribed and will watch for signs of hypotension as she continues her lifestyle modifications. Continue Losartan at this time.   At risk for osteopenia and osteoporosis Shannon Mckenzie was given extended  (15 minutes) osteoporosis prevention counseling today. Shannon Mckenzie is at risk for osteopenia and osteoporsis due to her vitamin D deficiency. She was encouraged to take her vitamin D and follow her higher calcium diet and increase strengthening exercise to help strengthen her bones and decrease her risk of osteopenia and osteoporosis.  Obesity Shannon Mckenzie is currently in the action stage of change. As such, her goal is to continue with weight loss efforts She has agreed to follow the Category 1 plan Shannon Mckenzie has been instructed to work up to a goal of 150 minutes of combined cardio and strengthening exercise per week for weight loss and overall health benefits. We discussed the following Behavioral Modification Stratagies today: increasing lean protein intake, increasing vegetables and work on meal planning and easy cooking plans and planning for success.    Shannon Mckenzie has agreed to follow up with our clinic in 2 weeks. She was informed of the importance of frequent follow up visits to maximize her success with intensive lifestyle modifications for her multiple health conditions.   OBESITY BEHAVIORAL INTERVENTION VISIT  Today's visit was # 2   Starting weight: 185 lb Starting date: 02/19/18 Today's weight : Weight: 176 lb (79.8 kg)  Today's date: 03/17/2018 Total lbs lost to date: 9    ASK: We discussed the diagnosis of obesity with Shannon Mckenzie today and Shannon Mckenzie agreed to give [place] permission to discuss  obesity behavioral modification therapy today.  ASSESS: Shannon Mckenzie has the diagnosis of obesity and her BMI today is @TBMI @ Shannon Mckenzie is in the action stage of change   ADVISE: Shannon Mckenzie was educated on the multiple health risks of obesity as well as the benefit of weight loss to improve her health. She was advised of the need for long term treatment and the importance of lifestyle modifications to improve her current health and to decrease her risk of future health problems.  AGREE: Multiple dietary modification options and treatment options were discussed and  Shannon Mckenzie agreed to follow the recommendations documented in the above note.  ARRANGE: Shannon Mckenzie was educated on the importance of frequent visits to treat obesity as outlined per CMS and USPSTF guidelines and agreed to schedule her next follow up appointment today.  I, April Moore, am acting as Energy manager for Dr Debbra Riding.  I have reviewed the above documentation for accuracy and completeness, and I agree with the above. -  Ilene Qua, MD

## 2018-03-20 DIAGNOSIS — M1611 Unilateral primary osteoarthritis, right hip: Secondary | ICD-10-CM | POA: Diagnosis not present

## 2018-03-30 ENCOUNTER — Ambulatory Visit (INDEPENDENT_AMBULATORY_CARE_PROVIDER_SITE_OTHER): Payer: BLUE CROSS/BLUE SHIELD | Admitting: Family Medicine

## 2018-03-30 VITALS — BP 120/68 | HR 83 | Temp 98.3°F | Ht 60.0 in | Wt 176.0 lb

## 2018-03-30 DIAGNOSIS — I10 Essential (primary) hypertension: Secondary | ICD-10-CM | POA: Diagnosis not present

## 2018-03-30 DIAGNOSIS — E7849 Other hyperlipidemia: Secondary | ICD-10-CM | POA: Diagnosis not present

## 2018-03-30 DIAGNOSIS — E669 Obesity, unspecified: Secondary | ICD-10-CM | POA: Diagnosis not present

## 2018-03-30 DIAGNOSIS — Z6834 Body mass index (BMI) 34.0-34.9, adult: Secondary | ICD-10-CM

## 2018-03-30 NOTE — Progress Notes (Signed)
Office: [phone]  /  Fax: [phone]   HPI:   Chief Complaint: OBESITY Shannon Mckenzie is here to discuss her progress with her obesity treatment plan. She is on the Category 1 plan and is following her eating plan approximately 80 % of the time. She states she is exercising 0 minutes 0 times per week. Shannon Mckenzie's hip surgery is scheduled for November 21st. She is not having any hunger.  Her weight is 176 lb (79.8 kg) today and has not lost weight since her last visit. She has lost 9 lbs since starting treatment with [place].  Hyperlipidemia Shannon Mckenzie has hyperlipidemia and has been trying to improve her cholesterol levels with intensive lifestyle modification including a low saturated fat diet, exercise and weight loss. Her LDL was elevated on her initial labs on 02/19/18. She is not on a statin, but she is on Zetia.  Hypertension Shannon Mckenzie is a 57 y.o. female with hypertension. Shannon Mckenzie denies chest pain, chest pressure, and headaches. She is working on weight loss to help control her blood pressure with the goal of decreasing her risk of heart attack and stroke. Shannon Mckenzie's blood pressure is currently controlled today.  ALLERGIES: Allergies  Allergen Reactions  . Statins     Myalgias with all statins  . Amoxicillin Hives and Rash    MEDICATIONS: Current Outpatient Medications on File Prior to Visit  Medication Sig Dispense Refill  . albuterol (PROVENTIL HFA;VENTOLIN HFA) 108 (90 Base) MCG/ACT inhaler Inhale 2 puffs into the lungs as needed.    Marland Kitchen BREO ELLIPTA 100-25 MCG/INH AEPB INHALE 1 PUFF INTO THE LUNGS DAILY 60 each 2  . Cholecalciferol (D3 HIGH POTENCY) 2000 units CAPS Take 1,000 Units by mouth.     . diclofenac (VOLTAREN) 75 MG EC tablet Take 1 tablet by mouth 2 (two) times daily.   2  . escitalopram (LEXAPRO) 20 MG tablet Take 20 mg by mouth daily.    Marland Kitchen esomeprazole (NEXIUM) 40 MG capsule Take 1 capsule (40 mg total) by mouth 2 (two) times daily. 180 capsule 3  . ezetimibe (ZETIA) 10 MG  tablet TAKE 1 TABLET(10 MG) BY MOUTH DAILY 30 tablet 5  . ferrous fumarate (HEMOCYTE - 106 MG FE) 325 (106 FE) MG TABS tablet Take 1 tablet by mouth.    . fluticasone (FLONASE) 50 MCG/ACT nasal spray Place 1 spray into both nostrils daily.    Marland Kitchen losartan (COZAAR) 50 MG tablet TAKE 1 TABLET(50 MG) BY MOUTH DAILY 90 tablet 1  . Melatonin 5 MG TABS Take by mouth.    . montelukast (SINGULAIR) 10 MG tablet TAKE 1 TABLET BY MOUTH EVERY NIGHT AT BEDTIME 90 tablet 3  . tolterodine (DETROL) 2 MG tablet Take 2 mg by mouth once.    . traMADol (ULTRAM) 50 MG tablet Take by mouth every 6 (six) hours as needed.    . TURMERIC PO Take by mouth.    . Vitamin D, Ergocalciferol, (DRISDOL) 50000 units CAPS capsule Take 1 capsule (50,000 Units total) by mouth every 7 (seven) days. 4 capsule 0   No current facility-administered medications on file prior to visit.     PAST MEDICAL HISTORY: Past Medical History:  Diagnosis Date  . Allergy   . Anemia   . Anxiety   . Asthma   . Depression   . DJD (degenerative joint disease) of hip   . Former smoker   . GERD (gastroesophageal reflux disease)   . Hand pain, right   . Hiatal hernia   .  History of kidney stones    history  . Hyperlipidemia   . Hypertension   . Kidney stone   . Osteoarthritis   . Pain, hip    right  . Pneumonia    hx but no hospitalization  . Reactive airway disease   . RLS (restless legs syndrome)   . Routine gynecological examination    sees gynecology, [name] NP  . SOB (shortness of breath) 9/14   cardiac eval with stress test and echo; Dr. [name]  . Statin intolerance   . Vision problems    wears contact lenses    PAST SURGICAL HISTORY: Past Surgical History:  Procedure Laterality Date  . CARPAL TUNNEL RELEASE Left 07/03/2017   Procedure: LEFT CARPAL TUNNEL RELEASE;  Surgeon: Betha Loa, MD;  Location: [facility];  Service: Orthopedics;  Laterality: Left;  . COLONOSCOPY  06/2012   Dr. Ramon Dredge; repeat  10 years  . UPPER GI ENDOSCOPY N/A 07/24/2012   Hiatus Hernia,normal stomach, normal examined duodenum  . WISDOM TOOTH EXTRACTION      SOCIAL HISTORY: Social History   Tobacco Use  . Smoking status: Former Smoker    Packs/day: 1.00    Years: 15.00    Pack years: 15.00    Types: Cigarettes    Last attempt to quit: 06/25/1991    Years since quitting: 26.7  . Smokeless tobacco: Never Used  Substance Use Topics  . Alcohol use: Yes    Alcohol/week: 0.0 standard drinks    Frequency: Never    Comment: margharita  . Drug use: No    FAMILY HISTORY: Family History  Problem Relation Age of Onset  . Arthritis Mother   . Asthma Mother   . Diabetes Mother   . Cirrhosis Mother        died of liver nonalcoholic cirrhosis  . COPD Mother   . Heart disease Mother        CHF  . Hypertension Mother   . Kidney disease Mother   . Liver disease Mother   . Eating disorder Mother   . Obesity Mother   . Asthma Sister        doesnt know history well  . Drug abuse Sister   . Cancer Father        died of pancreatic cancer at age 22  . Alcohol abuse Father   . Depression Brother   . Alcohol abuse Brother   . Stroke Maternal Aunt        died age 50yo of CVA  . Other Brother        died of MVA  . Other Sister   . Drug abuse Sister        doesnt know history well    ROS: Review of Systems  Constitutional: Negative for weight loss.  Cardiovascular: Negative for chest pain.       Negative for chest pressure.  Neurological: Negative for headaches.    PHYSICAL EXAM: Blood pressure 120/68, pulse 83, temperature 98.3 F (36.8 C), temperature source Oral, height 5' (1.524 m), weight 176 lb (79.8 kg), last menstrual period 03/13/2015, SpO2 97 %. Body mass index is 34.37 kg/m. Physical Exam  Constitutional: She is oriented to person, place, and time. She appears well-developed and well-nourished.  Cardiovascular: Normal rate.  Pulmonary/Chest: Effort normal.  Musculoskeletal: Normal  range of motion.  Neurological: She is oriented to person, place, and time.  Skin: Skin is warm and dry.  Psychiatric: She has a normal mood  and affect. Her behavior is normal.  Vitals reviewed.   RECENT LABS AND TESTS: BMET    Component Value Date/Time   NA 142 02/19/2018 1207   K 4.3 02/19/2018 1207   CL 101 02/19/2018 1207   CO2 26 02/19/2018 1207   GLUCOSE 71 02/19/2018 1207   GLUCOSE 86 01/09/2018 0958   BUN 25 (H) 02/19/2018 1207   CREATININE 0.77 02/19/2018 1207   CREATININE 0.63 06/29/2013 1000   CALCIUM 9.5 02/19/2018 1207   GFRNONAA 87 02/19/2018 1207   GFRAA 101 02/19/2018 1207   Lab Results  Component Value Date   HGBA1C 5.1 02/19/2018   HGBA1C 5.2 05/03/2014   Lab Results  Component Value Date   INSULIN 5.8 02/19/2018   CBC    Component Value Date/Time   WBC 5.1 02/19/2018 1207   WBC 8.8 09/06/2017 1956   RBC 4.62 02/19/2018 1207   RBC 4.65 09/06/2017 1956   HGB 13.7 02/19/2018 1207   HCT 42.0 02/19/2018 1207   PLT 264 09/06/2017 1956   MCV 91 02/19/2018 1207   MCH 29.7 02/19/2018 1207   MCH 30.1 09/06/2017 1956   MCHC 32.6 02/19/2018 1207   MCHC 33.1 09/06/2017 1956   RDW 13.3 02/19/2018 1207   LYMPHSABS 1.6 02/19/2018 1207   MONOABS 0.4 10/25/2016 1201   EOSABS 0.5 (H) 02/19/2018 1207   BASOSABS 0.0 02/19/2018 1207   Iron/TIBC/Ferritin/ %Sat    Component Value Date/Time   IRON 117 02/20/2016 1018   TIBC 378 05/03/2014 1009   FERRITIN 5.7 (L) 05/03/2014 1009   IRONPCTSAT 38.0 02/20/2016 1018   IRONPCTSAT 9 (L) 05/03/2014 1009   Lipid Panel     Component Value Date/Time   CHOL 273 (H) 02/19/2018 1207   TRIG 123 02/19/2018 1207   HDL 51 02/19/2018 1207   CHOLHDL 6 07/14/2017 1000   VLDL 25.2 07/14/2017 1000   LDLCALC 197 (H) 02/19/2018 1207   LDLDIRECT 171.0 01/09/2018 0958   Hepatic Function Panel     Component Value Date/Time   PROT 6.6 02/19/2018 1207   ALBUMIN 4.4 02/19/2018 1207   AST 21 02/19/2018 1207   ALT 19  02/19/2018 1207   ALKPHOS 114 02/19/2018 1207   BILITOT 0.4 02/19/2018 1207      Component Value Date/Time   TSH 2.240 02/19/2018 1207   TSH 3.62 01/17/2016 1158   TSH 2.45 05/03/2014 1009   Results for BRYONY, KAMAN (MRN [id number]) as of 03/30/2018 15:41  Ref. Range 02/19/2018 12:07  Vitamin D, 25-Hydroxy Latest Ref Range: 30.0 - 100.0 ng/mL 43.6    ASSESSMENT AND PLAN: Other hyperlipidemia  Essential hypertension  Class 1 obesity with serious comorbidity and body mass index (BMI) of 34.0 to 34.9 in adult, unspecified obesity type  PLAN:  Hyperlipidemia Shannon Mckenzie was informed of the American Heart Association Guidelines emphasizing intensive lifestyle modifications as the first line treatment for hyperlipidemia. We discussed many lifestyle modifications today in depth, and Shannon Mckenzie will continue to work on decreasing saturated fats such as fatty red meat, butter and many fried foods. She will also increase vegetables and lean protein in her diet and continue to work on exercise and weight loss efforts. We will repeat labs in early December. Shannon Mckenzie agrees to follow up as directed.  Hypertension We discussed sodium restriction, working on healthy weight loss, and a regular exercise program as the means to achieve improved blood pressure control. Shannon Mckenzie agreed with this plan and agreed to follow up as directed. We will continue to  monitor her blood pressure as well as her progress with the above lifestyle modifications. She will continue her current medications as prescribed and will watch for signs of hypotension as she continues her lifestyle modifications. She agrees to follow up in 2 weeks.  Obesity Shannon Mckenzie is currently in the action stage of change. As such, her goal is to continue with weight loss efforts. She has agreed to follow the Category 1 plan. Shannon Mckenzie has been instructed to work up to a goal of 150 minutes of combined cardio and strengthening exercise per week for weight loss and  overall health benefits. We discussed the following Behavioral Modification Strategies today: increasing lean protein intake, increasing vegetables, work on meal planning and easy cooking plans, and planning for success.  I spent > than 50% of the 15 minute visit on counseling as documented in the note.  Shannon Mckenzie has agreed to follow up with our clinic in 2 weeks. She was informed of the importance of frequent follow up visits to maximize her success with intensive lifestyle modifications for her multiple health conditions.   OBESITY BEHAVIORAL INTERVENTION VISIT  Today's visit was # 3   Starting weight: 185 lbs Starting date: 02/19/18 Today's weight : Weight: 176 lb (79.8 kg)  Today's date: 03/30/2018 Total lbs lost to date: 9  ASK: We discussed the diagnosis of obesity with [name] today and Shannon Mckenzie agreed to give [place] permission to discuss obesity behavioral modification therapy today.  ASSESS: Shannon Mckenzie has the diagnosis of obesity and her BMI today is 34.37. Shannon Mckenzie is in the action stage of change.   ADVISE: Shannon Mckenzie was educated on the multiple health risks of obesity as well as the benefit of weight loss to improve her health. She was advised of the need for long term treatment and the importance of lifestyle modifications to improve her current health and to decrease her risk of future health problems.  AGREE: Multiple dietary modification options and treatment options were discussed and Shannon Mckenzie agreed to follow the recommendations documented in the above note.  ARRANGE: Shannon Mckenzie was educated on the importance of frequent visits to treat obesity as outlined per CMS and USPSTF guidelines and agreed to schedule her next follow up appointment today.  I, Kirke Corin, am acting as Energy manager for Filbert Schilder, MD  I have reviewed the above documentation for accuracy and completeness, and I agree with the above. - Debbra Riding, MD

## 2018-04-07 DIAGNOSIS — M25551 Pain in right hip: Secondary | ICD-10-CM | POA: Diagnosis not present

## 2018-04-07 DIAGNOSIS — M1611 Unilateral primary osteoarthritis, right hip: Secondary | ICD-10-CM | POA: Diagnosis not present

## 2018-04-14 ENCOUNTER — Ambulatory Visit (INDEPENDENT_AMBULATORY_CARE_PROVIDER_SITE_OTHER): Payer: BLUE CROSS/BLUE SHIELD | Admitting: Family Medicine

## 2018-04-14 VITALS — BP 119/77 | HR 78 | Temp 97.9°F | Ht 60.0 in | Wt 171.0 lb

## 2018-04-14 DIAGNOSIS — I10 Essential (primary) hypertension: Secondary | ICD-10-CM | POA: Diagnosis not present

## 2018-04-14 DIAGNOSIS — Z6833 Body mass index (BMI) 33.0-33.9, adult: Secondary | ICD-10-CM

## 2018-04-14 DIAGNOSIS — E559 Vitamin D deficiency, unspecified: Secondary | ICD-10-CM

## 2018-04-14 DIAGNOSIS — E669 Obesity, unspecified: Secondary | ICD-10-CM | POA: Diagnosis not present

## 2018-04-14 DIAGNOSIS — Z9189 Other specified personal risk factors, not elsewhere classified: Secondary | ICD-10-CM

## 2018-04-14 MED ORDER — VITAMIN D (ERGOCALCIFEROL) 1.25 MG (50000 UNIT) PO CAPS: 50000.0000 [IU] | ORAL_CAPSULE | ORAL | 0 refills | Status: DC

## 2018-04-16 NOTE — Progress Notes (Signed)
Office: [phone]  /  Fax: [phone]   HPI:   Chief Complaint: OBESITY Shannon Mckenzie is here to discuss her progress with her obesity treatment plan. She is on the Category 1 plan and is following her eating plan approximately 90 % of the time. She states she is exercising 0 minutes 0 times per week. Shannon Mckenzie went to [place] and was very mindful of portions and trying her best to get back on track.  Her weight is 171 lb (77.6 kg) today and has had a weight loss of 4 pounds over a period of 2 weeks since her last visit. She has lost 14 lbs since starting treatment with [place].  Vitamin D deficiency Shannon Mckenzie has a diagnosis of vitamin D deficiency. She is currently taking vit D. She admits fatigue and denies nausea, vomiting or muscle weakness.  At risk for osteopenia and osteoporosis Shannon Mckenzie is at higher risk of osteopenia and osteoporosis due to vitamin D deficiency.   Hypertension Shannon Mckenzie is a 57 y.o. female with hypertension. Shannon Mckenzie denies chest pain, chest pressure, or headaches. She is working on weight loss to help control her blood pressure with the goal of decreasing her risk of heart attack and stroke. Shannon Mckenzie's blood pressure is controlled today.  ALLERGIES: Allergies  Allergen Reactions  . Statins     Myalgias with all statins  . Amoxicillin Hives and Rash    MEDICATIONS: Current Outpatient Medications on File Prior to Visit  Medication Sig Dispense Refill  . albuterol (PROVENTIL HFA;VENTOLIN HFA) 108 (90 Base) MCG/ACT inhaler Inhale 2 puffs into the lungs as needed.    Marland Kitchen BREO ELLIPTA 100-25 MCG/INH AEPB INHALE 1 PUFF INTO THE LUNGS DAILY 60 each 2  . Cholecalciferol (D3 HIGH POTENCY) 2000 units CAPS Take 1,000 Units by mouth.     . diclofenac (VOLTAREN) 75 MG EC tablet Take 1 tablet by mouth 2 (two) times daily.   2  . escitalopram (LEXAPRO) 20 MG tablet Take 20 mg by mouth daily.    Marland Kitchen esomeprazole (NEXIUM) 40 MG capsule Take 1 capsule (40 mg total) by mouth 2 (two) times  daily. 180 capsule 3  . ezetimibe (ZETIA) 10 MG tablet TAKE 1 TABLET(10 MG) BY MOUTH DAILY 30 tablet 5  . ferrous fumarate (HEMOCYTE - 106 MG FE) 325 (106 FE) MG TABS tablet Take 1 tablet by mouth.    . fluticasone (FLONASE) 50 MCG/ACT nasal spray Place 1 spray into both nostrils daily.    Marland Kitchen losartan (COZAAR) 50 MG tablet TAKE 1 TABLET(50 MG) BY MOUTH DAILY 90 tablet 1  . Melatonin 5 MG TABS Take by mouth.    . montelukast (SINGULAIR) 10 MG tablet TAKE 1 TABLET BY MOUTH EVERY NIGHT AT BEDTIME 90 tablet 3  . tolterodine (DETROL) 2 MG tablet Take 2 mg by mouth once.    . traMADol (ULTRAM) 50 MG tablet Take by mouth every 6 (six) hours as needed.    . TURMERIC PO Take by mouth.     No current facility-administered medications on file prior to visit.     PAST MEDICAL HISTORY: Past Medical History:  Diagnosis Date  . Allergy   . Anemia   . Anxiety   . Asthma   . Depression   . DJD (degenerative joint disease) of hip   . Former smoker   . GERD (gastroesophageal reflux disease)   . Hand pain, right   . Hiatal hernia   . History of kidney stones    history  .  Hyperlipidemia   . Hypertension   . Kidney stone   . Osteoarthritis   . Pain, hip    right  . Pneumonia    hx but no hospitalization  . Reactive airway disease   . RLS (restless legs syndrome)   . Routine gynecological examination    sees gynecology, [name] NP  . SOB (shortness of breath) 9/14   cardiac eval with stress test and echo; Dr. [name]  . Statin intolerance   . Vision problems    wears contact lenses    PAST SURGICAL HISTORY: Past Surgical History:  Procedure Laterality Date  . CARPAL TUNNEL RELEASE Left 07/03/2017   Procedure: LEFT CARPAL TUNNEL RELEASE;  Surgeon: Betha Loa, MD;  Location: [facility];  Service: Orthopedics;  Laterality: Left;  . COLONOSCOPY  06/2012   Dr. Ramon Dredge; repeat 10 years  . UPPER GI ENDOSCOPY N/A 07/24/2012   Hiatus Hernia,normal stomach, normal examined  duodenum  . WISDOM TOOTH EXTRACTION      SOCIAL HISTORY: Social History   Tobacco Use  . Smoking status: Former Smoker    Packs/day: 1.00    Years: 15.00    Pack years: 15.00    Types: Cigarettes    Last attempt to quit: 06/25/1991    Years since quitting: 26.8  . Smokeless tobacco: Never Used  Substance Use Topics  . Alcohol use: Yes    Alcohol/week: 0.0 standard drinks    Frequency: Never    Comment: margharita  . Drug use: No    FAMILY HISTORY: Family History  Problem Relation Age of Onset  . Arthritis Mother   . Asthma Mother   . Diabetes Mother   . Cirrhosis Mother        died of liver nonalcoholic cirrhosis  . COPD Mother   . Heart disease Mother        CHF  . Hypertension Mother   . Kidney disease Mother   . Liver disease Mother   . Eating disorder Mother   . Obesity Mother   . Asthma Sister        doesnt know history well  . Drug abuse Sister   . Cancer Father        died of pancreatic cancer at age 33  . Alcohol abuse Father   . Depression Brother   . Alcohol abuse Brother   . Stroke Maternal Aunt        died age 56yo of CVA  . Other Brother        died of MVA  . Other Sister   . Drug abuse Sister        doesnt know history well    ROS: Review of Systems  Constitutional: Positive for malaise/fatigue and weight loss.  Cardiovascular: Negative for chest pain.       Negative for chest pressure.  Gastrointestinal: Negative for nausea and vomiting.  Neurological: Negative for headaches.    PHYSICAL EXAM: Blood pressure 119/77, pulse 78, temperature 97.9 F (36.6 C), temperature source Oral, height 5' (1.524 m), weight 171 lb (77.6 kg), last menstrual period 03/13/2015, SpO2 96 %. Body mass index is 33.4 kg/m. Physical Exam  Constitutional: She is oriented to person, place, and time. She appears well-developed and well-nourished.  Cardiovascular: Normal rate.  Pulmonary/Chest: Effort normal.  Musculoskeletal: Normal range of motion.    Neurological: She is oriented to person, place, and time.  Skin: Skin is warm and dry.  Psychiatric: She has a normal mood  and affect. Her behavior is normal.  Vitals reviewed.   RECENT LABS AND TESTS: BMET    Component Value Date/Time   NA 142 02/19/2018 1207   K 4.3 02/19/2018 1207   CL 101 02/19/2018 1207   CO2 26 02/19/2018 1207   GLUCOSE 71 02/19/2018 1207   GLUCOSE 86 01/09/2018 0958   BUN 25 (H) 02/19/2018 1207   CREATININE 0.77 02/19/2018 1207   CREATININE 0.63 06/29/2013 1000   CALCIUM 9.5 02/19/2018 1207   GFRNONAA 87 02/19/2018 1207   GFRAA 101 02/19/2018 1207   Lab Results  Component Value Date   HGBA1C 5.1 02/19/2018   HGBA1C 5.2 05/03/2014   Lab Results  Component Value Date   INSULIN 5.8 02/19/2018   CBC    Component Value Date/Time   WBC 5.1 02/19/2018 1207   WBC 8.8 09/06/2017 1956   RBC 4.62 02/19/2018 1207   RBC 4.65 09/06/2017 1956   HGB 13.7 02/19/2018 1207   HCT 42.0 02/19/2018 1207   PLT 264 09/06/2017 1956   MCV 91 02/19/2018 1207   MCH 29.7 02/19/2018 1207   MCH 30.1 09/06/2017 1956   MCHC 32.6 02/19/2018 1207   MCHC 33.1 09/06/2017 1956   RDW 13.3 02/19/2018 1207   LYMPHSABS 1.6 02/19/2018 1207   MONOABS 0.4 10/25/2016 1201   EOSABS 0.5 (H) 02/19/2018 1207   BASOSABS 0.0 02/19/2018 1207   Iron/TIBC/Ferritin/ %Sat    Component Value Date/Time   IRON 117 02/20/2016 1018   TIBC 378 05/03/2014 1009   FERRITIN 5.7 (L) 05/03/2014 1009   IRONPCTSAT 38.0 02/20/2016 1018   IRONPCTSAT 9 (L) 05/03/2014 1009   Lipid Panel     Component Value Date/Time   CHOL 273 (H) 02/19/2018 1207   TRIG 123 02/19/2018 1207   HDL 51 02/19/2018 1207   CHOLHDL 6 07/14/2017 1000   VLDL 25.2 07/14/2017 1000   LDLCALC 197 (H) 02/19/2018 1207   LDLDIRECT 171.0 01/09/2018 0958   Hepatic Function Panel     Component Value Date/Time   PROT 6.6 02/19/2018 1207   ALBUMIN 4.4 02/19/2018 1207   AST 21 02/19/2018 1207   ALT 19 02/19/2018 1207    ALKPHOS 114 02/19/2018 1207   BILITOT 0.4 02/19/2018 1207      Component Value Date/Time   TSH 2.240 02/19/2018 1207   TSH 3.62 01/17/2016 1158   TSH 2.45 05/03/2014 1009   Results for ANTONIO, WOODHAMS (MRN [id number]) as of 04/16/2018 16:08  Ref. Range 02/19/2018 12:07  Vitamin D, 25-Hydroxy Latest Ref Range: 30.0 - 100.0 ng/mL 43.6   ASSESSMENT AND PLAN: Vitamin D deficiency - Plan: Vitamin D, Ergocalciferol, (DRISDOL) 50000 units CAPS capsule  Essential hypertension  At risk for osteoporosis  Class 1 obesity with serious comorbidity and body mass index (BMI) of 33.0 to 33.9 in adult, unspecified obesity type  PLAN:  Vitamin D Deficiency Shannon Mckenzie was informed that low vitamin D levels contributes to fatigue and are associated with obesity, breast, and colon cancer. She agrees to continue to take prescription Vit D @50 ,000 IU every week #4 with no refills and will follow up for routine testing of vitamin D, at least 2-3 times per year. She was informed of the risk of over-replacement of vitamin D and agrees to not increase her dose unless she discusses this with [place] first. She agrees to follow up in 2 weeks.  At risk for osteopenia and osteoporosis Shannon Mckenzie was given extended (15 minutes) osteoporosis prevention counseling today. Shannon Mckenzie is at risk  for osteopenia and osteoporosis due to her vitamin D deficiency. She was encouraged to take her vitamin D and follow her higher calcium diet and increase strengthening exercise to help strengthen her bones and decrease her risk of osteopenia and osteoporosis.  Hypertension We discussed sodium restriction, working on healthy weight loss, and a regular exercise program as the means to achieve improved blood pressure control. We will continue to monitor her blood pressure as well as her progress with the above lifestyle modifications. She will continue her medications as prescribed and will watch for signs of hypotension as she continues her lifestyle  modifications. Shannon Mckenzie agreed with this plan and agreed to follow up as directed.  Obesity Shannon Mckenzie is currently in the action stage of change. As such, her goal is to continue with weight loss efforts. She has agreed to follow the Category 1 plan. Shannon Mckenzie has been instructed to work up to a goal of 150 minutes of combined cardio and strengthening exercise per week for weight loss and overall health benefits. We discussed the following Behavioral Modification Strategies today: increasing lean protein intake, increasing vegetables, work on meal planning and easy cooking plans, and planning for success.  Shannon Mckenzie has agreed to follow up with our clinic in 2 weeks. She was informed of the importance of frequent follow up visits to maximize her success with intensive lifestyle modifications for her multiple health conditions.   OBESITY BEHAVIORAL INTERVENTION VISIT  Today's visit was # 4   Starting weight: 185 lbs Starting date: 02/19/18 Today's weight : Weight: 171 lb (77.6 kg)  Today's date: 04/14/2018 Total lbs lost to date: 14  ASK: We discussed the diagnosis of obesity with Shannon Mckenzie today and Shannon Mckenzie agreed to give [place] permission to discuss obesity behavioral modification therapy today.  ASSESS: Shannon Mckenzie has the diagnosis of obesity and her BMI today is 33.4. Shannon Mckenzie is in the action stage of change.   ADVISE: Shannon Mckenzie was educated on the multiple health risks of obesity as well as the benefit of weight loss to improve her health. She was advised of the need for long term treatment and the importance of lifestyle modifications to improve her current health and to decrease her risk of future health problems.  AGREE: Multiple dietary modification options and treatment options were discussed and Shannon Mckenzie agreed to follow the recommendations documented in the above note.  ARRANGE: Shannon Mckenzie was educated on the importance of frequent visits to treat obesity as outlined per CMS and USPSTF guidelines and  agreed to schedule her next follow up appointment today.  I, Kirke Corin, am acting as Energy manager for Filbert Schilder, MD  I have reviewed the above documentation for accuracy and completeness, and I agree with the above. - Debbra Riding, MD

## 2018-04-27 LAB — BASIC METABOLIC PANEL
BUN: 28 — AB (ref 4–21)
Creatinine: 0.8 (ref 0.5–1.1)
Glucose: 83
Potassium: 4 (ref 3.4–5.3)
Sodium: 142 (ref 137–147)

## 2018-04-27 LAB — HEPATIC FUNCTION PANEL
ALT: 16 (ref 7–35)
AST: 17 (ref 13–35)
Alkaline Phosphatase: 107 (ref 25–125)
Bilirubin, Total: 0.3

## 2018-04-27 LAB — LIPID PANEL
Cholesterol: 236 — AB (ref 0–200)
HDL: 54 (ref 35–70)
LDL Cholesterol: 167
Triglycerides: 76 (ref 40–160)

## 2018-04-27 LAB — HEMOGLOBIN A1C: Hemoglobin A1C: 5.1

## 2018-04-29 ENCOUNTER — Ambulatory Visit: Payer: BLUE CROSS/BLUE SHIELD | Admitting: Physician Assistant

## 2018-04-29 ENCOUNTER — Ambulatory Visit (INDEPENDENT_AMBULATORY_CARE_PROVIDER_SITE_OTHER): Payer: BLUE CROSS/BLUE SHIELD | Admitting: Family Medicine

## 2018-04-29 ENCOUNTER — Encounter: Payer: Self-pay | Admitting: Physician Assistant

## 2018-04-29 VITALS — BP 138/84 | HR 80 | Temp 97.9°F | Ht 60.0 in | Wt 179.4 lb

## 2018-04-29 VITALS — BP 149/80 | HR 76 | Temp 97.9°F | Ht 60.0 in | Wt 174.0 lb

## 2018-04-29 DIAGNOSIS — E669 Obesity, unspecified: Secondary | ICD-10-CM

## 2018-04-29 DIAGNOSIS — R05 Cough: Secondary | ICD-10-CM | POA: Diagnosis not present

## 2018-04-29 DIAGNOSIS — I10 Essential (primary) hypertension: Secondary | ICD-10-CM | POA: Diagnosis not present

## 2018-04-29 DIAGNOSIS — E559 Vitamin D deficiency, unspecified: Secondary | ICD-10-CM

## 2018-04-29 DIAGNOSIS — Z6834 Body mass index (BMI) 34.0-34.9, adult: Secondary | ICD-10-CM | POA: Diagnosis not present

## 2018-04-29 DIAGNOSIS — R059 Cough, unspecified: Secondary | ICD-10-CM

## 2018-04-29 MED ORDER — ALBUTEROL SULFATE HFA 108 (90 BASE) MCG/ACT IN AERS: 2.0000 | INHALATION_SPRAY | Freq: Four times a day (QID) | RESPIRATORY_TRACT | 2 refills | Status: DC | PRN

## 2018-04-29 MED ORDER — AZITHROMYCIN 250 MG PO TABS: ORAL_TABLET | ORAL | 0 refills | Status: DC

## 2018-04-29 NOTE — Patient Instructions (Addendum)
It was great to see you!  Try Delsym (or store brand equivalent) for cough.  I have also sent in the albuterol inhaler.  Use medication as prescribed: Azithromycin  Push fluids and get plenty of rest. Please return if you are not improving as expected, or if you have high fevers (>101.5) or difficulty swallowing or worsening productive cough.  Call clinic with questions.  I hope you start feeling better soon!

## 2018-04-29 NOTE — Progress Notes (Signed)
Shannon Mckenzie is a 57 y.o. female here for a new problem.  I acted as a Neurosurgeon for [employer], PA-C Corky Mull, LPN  History of Present Illness:   Chief Complaint  Patient presents with  . Cough    Cough  This is a new problem. The current episode started yesterday. The problem has been gradually worsening. The problem occurs every few hours. The cough is productive of sputum (Green sputum). Associated symptoms include chills. Pertinent negatives include no chest pain, ear congestion, ear pain, fever, headaches, nasal congestion, postnasal drip, sore throat, shortness of breath or wheezing. Associated symptoms comments: Hoarse voice. The symptoms are aggravated by lying down. Risk factors: grandchildren were sick. Treatments tried: Breo and nasal spray. The treatment provided mild relief. Her past medical history is significant for asthma, bronchitis and pneumonia.    Past Medical History:  Diagnosis Date  . Allergy   . Anemia   . Anxiety   . Asthma   . Depression   . DJD (degenerative joint disease) of hip   . Former smoker   . GERD (gastroesophageal reflux disease)   . Hand pain, right   . Hiatal hernia   . History of kidney stones    history  . Hyperlipidemia   . Hypertension   . Kidney stone   . Osteoarthritis   . Pain, hip    right  . Pneumonia    hx but no hospitalization  . Reactive airway disease   . RLS (restless legs syndrome)   . Routine gynecological examination    sees gynecology, [name] NP  . SOB (shortness of breath) 9/14   cardiac eval with stress test and echo; Dr. [name]  . Statin intolerance   . Vision problems    wears contact lenses     Social History   Socioeconomic History  . Marital status: Married    Spouse name: Maisie Fus  . Number of children: 1  . Years of education: 62  . Highest education level: Not on file  Occupational History  . Occupation: Equities trader: UNEMPLOYED  Social Needs  . Financial  resource strain: Not on file  . Food insecurity:    Worry: Not on file    Inability: Not on file  . Transportation needs:    Medical: Not on file    Non-medical: Not on file  Tobacco Use  . Smoking status: Former Smoker    Packs/day: 1.00    Years: 15.00    Pack years: 15.00    Types: Cigarettes    Last attempt to quit: 06/25/1991    Years since quitting: 26.8  . Smokeless tobacco: Never Used  Substance and Sexual Activity  . Alcohol use: Yes    Alcohol/week: 0.0 standard drinks    Frequency: Never    Comment: margharita  . Drug use: No  . Sexual activity: Not Currently  Lifestyle  . Physical activity:    Days per week: Not on file    Minutes per session: Not on file  . Stress: Not on file  Relationships  . Social connections:    Talks on phone: Not on file    Gets together: Not on file    Attends religious service: Not on file    Active member of club or organization: Not on file    Attends meetings of clubs or organizations: Not on file    Relationship status: Not on file  . Intimate partner violence:  Fear of current or ex partner: Not on file    Emotionally abused: Not on file    Physically abused: Not on file    Forced sexual activity: Not on file  Other Topics Concern  . Not on file  Social History Narrative   Married, 1 biological son, 1 adopted son.        Faith: Christian. Denies any religious beliefs that would effect health care.       Cleans homes for a living.       Fun: Watch movies, be outside and work in the yard, photography    Past Surgical History:  Procedure Laterality Date  . CARPAL TUNNEL RELEASE Left 07/03/2017   Procedure: LEFT CARPAL TUNNEL RELEASE;  Surgeon: Betha Loa, MD;  Location: [facility];  Service: Orthopedics;  Laterality: Left;  . COLONOSCOPY  06/2012   Dr. Ramon Dredge; repeat 10 years  . UPPER GI ENDOSCOPY N/A 07/24/2012   Hiatus Hernia,normal stomach, normal examined duodenum  . WISDOM TOOTH EXTRACTION       Family History  Problem Relation Age of Onset  . Arthritis Mother   . Asthma Mother   . Diabetes Mother   . Cirrhosis Mother        died of liver nonalcoholic cirrhosis  . COPD Mother   . Heart disease Mother        CHF  . Hypertension Mother   . Kidney disease Mother   . Liver disease Mother   . Eating disorder Mother   . Obesity Mother   . Asthma Sister        doesnt know history well  . Drug abuse Sister   . Cancer Father        died of pancreatic cancer at age 60  . Alcohol abuse Father   . Depression Brother   . Alcohol abuse Brother   . Stroke Maternal Aunt        died age 18yo of CVA  . Other Brother        died of MVA  . Other Sister   . Drug abuse Sister        doesnt know history well    Allergies  Allergen Reactions  . Statins     Myalgias with all statins  . Amoxicillin Hives and Rash    Current Medications:   Current Outpatient Medications:  .  BREO ELLIPTA 100-25 MCG/INH AEPB, INHALE 1 PUFF INTO THE LUNGS DAILY, Disp: 60 each, Rfl: 2 .  Cholecalciferol (D3 HIGH POTENCY) 2000 units CAPS, Take 1,000 Units by mouth. , Disp: , Rfl:  .  diclofenac (VOLTAREN) 75 MG EC tablet, Take 1 tablet by mouth 2 (two) times daily. , Disp: , Rfl: 2 .  escitalopram (LEXAPRO) 20 MG tablet, Take 20 mg by mouth daily., Disp: , Rfl:  .  esomeprazole (NEXIUM) 40 MG capsule, Take 1 capsule (40 mg total) by mouth 2 (two) times daily., Disp: 180 capsule, Rfl: 3 .  ezetimibe (ZETIA) 10 MG tablet, TAKE 1 TABLET(10 MG) BY MOUTH DAILY, Disp: 30 tablet, Rfl: 5 .  fluticasone (FLONASE) 50 MCG/ACT nasal spray, Place 1 spray into both nostrils daily., Disp: , Rfl:  .  losartan (COZAAR) 50 MG tablet, TAKE 1 TABLET(50 MG) BY MOUTH DAILY, Disp: 90 tablet, Rfl: 1 .  Melatonin 5 MG TABS, Take by mouth., Disp: , Rfl:  .  montelukast (SINGULAIR) 10 MG tablet, TAKE 1 TABLET BY MOUTH EVERY NIGHT AT BEDTIME, Disp: 90 tablet, Rfl:  3 .  tolterodine (DETROL) 2 MG tablet, Take 2 mg by mouth  once., Disp: , Rfl:  .  traMADol (ULTRAM) 50 MG tablet, Take by mouth every 6 (six) hours as needed., Disp: , Rfl:  .  TURMERIC PO, Take by mouth., Disp: , Rfl:  .  Vitamin D, Ergocalciferol, (DRISDOL) 50000 units CAPS capsule, Take 1 capsule (50,000 Units total) by mouth every 7 (seven) days., Disp: 4 capsule, Rfl: 0 .  albuterol (PROVENTIL HFA;VENTOLIN HFA) 108 (90 Base) MCG/ACT inhaler, Inhale 2 puffs into the lungs every 6 (six) hours as needed for wheezing or shortness of breath., Disp: 1 Inhaler, Rfl: 2 .  azithromycin (ZITHROMAX) 250 MG tablet, Take two tablets on day 1, then one tablet daily x 4 days, Disp: 6 tablet, Rfl: 0 .  Cyanocobalamin (B-12) 1000 MCG CAPS, Take by mouth., Disp: , Rfl:    Review of Systems:   Review of Systems  Constitutional: Positive for chills. Negative for fever.  HENT: Negative for ear pain, postnasal drip and sore throat.   Respiratory: Positive for cough. Negative for shortness of breath and wheezing.   Cardiovascular: Negative for chest pain.  Neurological: Negative for headaches.    Vitals:   Vitals:   04/29/18 1041  BP: 138/84  Pulse: 80  Temp: 97.9 F (36.6 C)  TempSrc: Oral  SpO2: 98%  Weight: 179 lb 6.1 oz (81.4 kg)  Height: 5' (1.524 m)     Body mass index is 35.03 kg/m.  Physical Exam:   Physical Exam  Constitutional: She appears well-developed. She is cooperative.  Non-toxic appearance. She does not have a sickly appearance. She does not appear ill. No distress.  HENT:  Head: Normocephalic and atraumatic.  Right Ear: Tympanic membrane, external ear and ear canal normal. Tympanic membrane is not erythematous, not retracted and not bulging.  Left Ear: Tympanic membrane, external ear and ear canal normal. Tympanic membrane is not erythematous, not retracted and not bulging.  Nose: Mucosal edema and rhinorrhea present. Right sinus exhibits no maxillary sinus tenderness and no frontal sinus tenderness. Left sinus exhibits no  maxillary sinus tenderness and no frontal sinus tenderness.  Mouth/Throat: Uvula is midline and mucous membranes are normal. Posterior oropharyngeal erythema present. No posterior oropharyngeal edema. Tonsils are 1+ on the right. Tonsils are 1+ on the left.  Eyes: Conjunctivae and lids are normal.  Neck: Trachea normal.  Cardiovascular: Normal rate, regular rhythm, S1 normal, S2 normal, normal heart sounds and normal pulses.  No LE edema  Pulmonary/Chest: Effort normal and breath sounds normal. She has no decreased breath sounds. She has no wheezes. She has no rhonchi. She has no rales.  Lymphadenopathy:    She has no cervical adenopathy.  Neurological: She is alert. GCS eye subscore is 4. GCS verbal subscore is 5. GCS motor subscore is 6.  Skin: Skin is warm, dry and intact.  Psychiatric: She has a normal mood and affect. Her speech is normal and behavior is normal.  Nursing note and vitals reviewed.   Assessment and Plan:    [name] was seen today for cough.  Diagnoses and all orders for this visit:  Cough  Other orders -     azithromycin (ZITHROMAX) 250 MG tablet; Take two tablets on day 1, then one tablet daily x 4 days -     albuterol (PROVENTIL HFA;VENTOLIN HFA) 108 (90 Base) MCG/ACT inhaler; Inhale 2 puffs into the lungs every 6 (six) hours as needed for wheezing or shortness of breath.  No red flags on exam.  Will initiate albuterol prn, delsym prn, azithromycin  per orders. Discussed taking medications as prescribed. Reviewed return precautions including worsening fever, SOB, worsening cough or other concerns. Push fluids and rest. I recommend that patient follow-up if symptoms worsen or persist despite treatment x 7-10 days, sooner if needed.  . Reviewed expectations re: course of current medical issues. . Discussed self-management of symptoms. . Outlined signs and symptoms indicating need for more acute intervention. . Patient verbalized understanding and all questions  were answered. . See orders for this visit as documented in the electronic medical record. . Patient received an After-Visit Summary.  CMA or LPN served as scribe during this visit. History, Physical, and Plan performed by medical provider. The above documentation has been reviewed and is accurate and complete.  [name], PA-C

## 2018-04-29 NOTE — Progress Notes (Signed)
Office: [phone]  /  Fax: [phone]   HPI:   Chief Complaint: OBESITY Shannon Mckenzie is here to discuss her progress with her obesity treatment plan. She is on the Category 1 plan and is following her eating plan approximately 0 % of the time. She states she is exercising 0 minutes 0 times per week. Shannon Mckenzie had a very stressful weekend and she had a breakdown. She is feeling ill today. Her hip surgery is scheduled for November 21st. Her weight is 174 lb (78.9 kg) today and has had a weight gain of 3 pounds over a period of 2 weeks since her last visit. She has lost 11 lbs since starting treatment with [place].  Hypertension DAVE MANNES is a 57 y.o. female with hypertension. Her blood pressure is elevated today at 149/80 and she didn't take her medications today. [name] denies chest pain, chest pressure or headache. She is working weight loss to help control her blood pressure with the goal of decreasing her risk of heart attack and stroke. Kathys blood pressure is not currently controlled.  Vitamin D deficiency Shannon Mckenzie has a diagnosis of vitamin D deficiency. She is currently taking prescription vit D. Shannon Mckenzie admits fatigue and denies nausea, vomiting or muscle weakness.  ALLERGIES: Allergies  Allergen Reactions  . Statins     Myalgias with all statins  . Amoxicillin Hives and Rash    MEDICATIONS: Current Outpatient Medications on File Prior to Visit  Medication Sig Dispense Refill  . BREO ELLIPTA 100-25 MCG/INH AEPB INHALE 1 PUFF INTO THE LUNGS DAILY 60 each 2  . Cholecalciferol (D3 HIGH POTENCY) 2000 units CAPS Take 1,000 Units by mouth.     . diclofenac (VOLTAREN) 75 MG EC tablet Take 1 tablet by mouth 2 (two) times daily.   2  . escitalopram (LEXAPRO) 20 MG tablet Take 20 mg by mouth daily.    Marland Kitchen esomeprazole (NEXIUM) 40 MG capsule Take 1 capsule (40 mg total) by mouth 2 (two) times daily. 180 capsule 3  . ezetimibe (ZETIA) 10 MG tablet TAKE 1 TABLET(10 MG) BY MOUTH DAILY 30  tablet 5  . fluticasone (FLONASE) 50 MCG/ACT nasal spray Place 1 spray into both nostrils daily.    Marland Kitchen losartan (COZAAR) 50 MG tablet TAKE 1 TABLET(50 MG) BY MOUTH DAILY 90 tablet 1  . Melatonin 5 MG TABS Take by mouth.    . montelukast (SINGULAIR) 10 MG tablet TAKE 1 TABLET BY MOUTH EVERY NIGHT AT BEDTIME 90 tablet 3  . tolterodine (DETROL) 2 MG tablet Take 2 mg by mouth once.    . traMADol (ULTRAM) 50 MG tablet Take by mouth every 6 (six) hours as needed.    . TURMERIC PO Take by mouth.    . Vitamin D, Ergocalciferol, (DRISDOL) 50000 units CAPS capsule Take 1 capsule (50,000 Units total) by mouth every 7 (seven) days. 4 capsule 0   No current facility-administered medications on file prior to visit.     PAST MEDICAL HISTORY: Past Medical History:  Diagnosis Date  . Allergy   . Anemia   . Anxiety   . Asthma   . Depression   . DJD (degenerative joint disease) of hip   . Former smoker   . GERD (gastroesophageal reflux disease)   . Hand pain, right   . Hiatal hernia   . History of kidney stones    history  . Hyperlipidemia   . Hypertension   . Kidney stone   . Osteoarthritis   .  Pain, hip    right  . Pneumonia    hx but no hospitalization  . Reactive airway disease   . RLS (restless legs syndrome)   . Routine gynecological examination    sees gynecology, [name] NP  . SOB (shortness of breath) 9/14   cardiac eval with stress test and echo; Dr. [name]  . Statin intolerance   . Vision problems    wears contact lenses    PAST SURGICAL HISTORY: Past Surgical History:  Procedure Laterality Date  . CARPAL TUNNEL RELEASE Left 07/03/2017   Procedure: LEFT CARPAL TUNNEL RELEASE;  Surgeon: Betha Loa, MD;  Location: [facility];  Service: Orthopedics;  Laterality: Left;  . COLONOSCOPY  06/2012   Dr. Ramon Dredge; repeat 10 years  . UPPER GI ENDOSCOPY N/A 07/24/2012   Hiatus Hernia,normal stomach, normal examined duodenum  . WISDOM TOOTH EXTRACTION       SOCIAL HISTORY: Social History   Tobacco Use  . Smoking status: Former Smoker    Packs/day: 1.00    Years: 15.00    Pack years: 15.00    Types: Cigarettes    Last attempt to quit: 06/25/1991    Years since quitting: 26.8  . Smokeless tobacco: Never Used  Substance Use Topics  . Alcohol use: Yes    Alcohol/week: 0.0 standard drinks    Frequency: Never    Comment: margharita  . Drug use: No    FAMILY HISTORY: Family History  Problem Relation Age of Onset  . Arthritis Mother   . Asthma Mother   . Diabetes Mother   . Cirrhosis Mother        died of liver nonalcoholic cirrhosis  . COPD Mother   . Heart disease Mother        CHF  . Hypertension Mother   . Kidney disease Mother   . Liver disease Mother   . Eating disorder Mother   . Obesity Mother   . Asthma Sister        doesnt know history well  . Drug abuse Sister   . Cancer Father        died of pancreatic cancer at age 44  . Alcohol abuse Father   . Depression Brother   . Alcohol abuse Brother   . Stroke Maternal Aunt        died age 40yo of CVA  . Other Brother        died of MVA  . Other Sister   . Drug abuse Sister        doesnt know history well    ROS: Review of Systems  Constitutional: Positive for malaise/fatigue. Negative for weight loss.  Gastrointestinal: Negative for nausea and vomiting.  Musculoskeletal:       Negative for muscle weakness    PHYSICAL EXAM: Blood pressure (!) 149/80, pulse 76, temperature 97.9 F (36.6 C), temperature source Oral, height 5' (1.524 m), weight 174 lb (78.9 kg), last menstrual period 03/13/2015, SpO2 98 %. Body mass index is 33.98 kg/m. Physical Exam  Constitutional: She is oriented to person, place, and time. She appears well-developed and well-nourished.  Cardiovascular: Normal rate.  Pulmonary/Chest: Effort normal.  Musculoskeletal: Normal range of motion.  Neurological: She is oriented to person, place, and time.  Skin: Skin is warm and dry.   Psychiatric: She has a normal mood and affect. Her behavior is normal.  Vitals reviewed.   RECENT LABS AND TESTS: BMET    Component Value Date/Time   NA  142 02/19/2018 1207   K 4.3 02/19/2018 1207   CL 101 02/19/2018 1207   CO2 26 02/19/2018 1207   GLUCOSE 71 02/19/2018 1207   GLUCOSE 86 01/09/2018 0958   BUN 25 (H) 02/19/2018 1207   CREATININE 0.77 02/19/2018 1207   CREATININE 0.63 06/29/2013 1000   CALCIUM 9.5 02/19/2018 1207   GFRNONAA 87 02/19/2018 1207   GFRAA 101 02/19/2018 1207   Lab Results  Component Value Date   HGBA1C 5.1 02/19/2018   HGBA1C 5.2 05/03/2014   Lab Results  Component Value Date   INSULIN 5.8 02/19/2018   CBC    Component Value Date/Time   WBC 5.1 02/19/2018 1207   WBC 8.8 09/06/2017 1956   RBC 4.62 02/19/2018 1207   RBC 4.65 09/06/2017 1956   HGB 13.7 02/19/2018 1207   HCT 42.0 02/19/2018 1207   PLT 264 09/06/2017 1956   MCV 91 02/19/2018 1207   MCH 29.7 02/19/2018 1207   MCH 30.1 09/06/2017 1956   MCHC 32.6 02/19/2018 1207   MCHC 33.1 09/06/2017 1956   RDW 13.3 02/19/2018 1207   LYMPHSABS 1.6 02/19/2018 1207   MONOABS 0.4 10/25/2016 1201   EOSABS 0.5 (H) 02/19/2018 1207   BASOSABS 0.0 02/19/2018 1207   Iron/TIBC/Ferritin/ %Sat    Component Value Date/Time   IRON 117 02/20/2016 1018   TIBC 378 05/03/2014 1009   FERRITIN 5.7 (L) 05/03/2014 1009   IRONPCTSAT 38.0 02/20/2016 1018   IRONPCTSAT 9 (L) 05/03/2014 1009   Lipid Panel     Component Value Date/Time   CHOL 273 (H) 02/19/2018 1207   TRIG 123 02/19/2018 1207   HDL 51 02/19/2018 1207   CHOLHDL 6 07/14/2017 1000   VLDL 25.2 07/14/2017 1000   LDLCALC 197 (H) 02/19/2018 1207   LDLDIRECT 171.0 01/09/2018 0958   Hepatic Function Panel     Component Value Date/Time   PROT 6.6 02/19/2018 1207   ALBUMIN 4.4 02/19/2018 1207   AST 21 02/19/2018 1207   ALT 19 02/19/2018 1207   ALKPHOS 114 02/19/2018 1207   BILITOT 0.4 02/19/2018 1207      Component Value Date/Time    TSH 2.240 02/19/2018 1207   TSH 3.62 01/17/2016 1158   TSH 2.45 05/03/2014 1009   Results for MERLINA, MARCHENA (MRN [id number]) as of 04/29/2018 17:03  Ref. Range 02/19/2018 12:07  Vitamin D, 25-Hydroxy Latest Ref Range: 30.0 - 100.0 ng/mL 43.6   ASSESSMENT AND PLAN: Essential hypertension  Vitamin D deficiency  Class 1 obesity with serious comorbidity and body mass index (BMI) of 34.0 to 34.9 in adult, unspecified obesity type  PLAN:  Hypertension We discussed sodium restriction, working on healthy weight loss, and a regular exercise program as the means to achieve improved blood pressure control. Shannon Mckenzie agreed with this plan and agreed to follow up as directed. We will follow up on her blood pressure at the next appointment and will continue to monitor her blood pressure as well as her progress with the above lifestyle modifications. She will continue her medications as prescribed and will watch for signs of hypotension as she continues her lifestyle modifications.  Vitamin D Deficiency Shannon Mckenzie was informed that low vitamin D levels contributes to fatigue and are associated with obesity, breast, and colon cancer. She agrees to continue to take prescription Vit D @50 ,000 IU every week (no refill needed) and will follow up for routine testing of vitamin D, at least 2-3 times per year. She was informed of the risk of over-replacement  of vitamin D and agrees to not increase her dose unless she discusses this with [place] first.  I spent > than 50% of the 15 minute visit on counseling as documented in the note.  Obesity Shannon Mckenzie is currently in the action stage of change. As such, her goal is to continue with weight loss efforts She has agreed to keep a food journal with 300 to 400 calories and 30+ grams of protein at supper daily and follow the Category 1 plan Shannon Mckenzie has been instructed to work up to a goal of 150 minutes of combined cardio and strengthening exercise per week for weight loss and overall  health benefits. We discussed the following Behavioral Modification Strategies today: planning for success, better snacking choices, increasing lean protein intake, increasing vegetables and work on meal planning and easy cooking plans  Shannon Mckenzie has agreed to follow up with our clinic in 2 weeks. She was informed of the importance of frequent follow up visits to maximize her success with intensive lifestyle modifications for her multiple health conditions.   OBESITY BEHAVIORAL INTERVENTION VISIT  Today's visit was # 5   Starting weight: 185 lbs Starting date: 02/19/18 Today's weight : 174 lbs  Today's date: 04/29/2018 Total lbs lost to date: 58   ASK: We discussed the diagnosis of obesity with [name] today and Shannon Mckenzie agreed to give [place] permission to discuss obesity behavioral modification therapy today.  ASSESS: Shannon Mckenzie has the diagnosis of obesity and her BMI today is 33.98 Shannon Mckenzie is in the action stage of change   ADVISE: Shannon Mckenzie was educated on the multiple health risks of obesity as well as the benefit of weight loss to improve her health. She was advised of the need for long term treatment and the importance of lifestyle modifications to improve her current health and to decrease her risk of future health problems.  AGREE: Multiple dietary modification options and treatment options were discussed and  Shannon Mckenzie agreed to follow the recommendations documented in the above note.  ARRANGE: Shannon Mckenzie was educated on the importance of frequent visits to treat obesity as outlined per CMS and USPSTF guidelines and agreed to schedule her next follow up appointment today.  I, Nevada Crane, am acting as transcriptionist for Filbert Schilder, MD  I have reviewed the above documentation for accuracy and completeness, and I agree with the above. - Debbra Riding, MD

## 2018-05-02 ENCOUNTER — Other Ambulatory Visit: Payer: Self-pay | Admitting: Family Medicine

## 2018-05-11 ENCOUNTER — Encounter (INDEPENDENT_AMBULATORY_CARE_PROVIDER_SITE_OTHER): Payer: Self-pay

## 2018-05-11 ENCOUNTER — Ambulatory Visit (INDEPENDENT_AMBULATORY_CARE_PROVIDER_SITE_OTHER): Payer: BLUE CROSS/BLUE SHIELD | Admitting: Family Medicine

## 2018-05-12 ENCOUNTER — Other Ambulatory Visit (INDEPENDENT_AMBULATORY_CARE_PROVIDER_SITE_OTHER): Payer: Self-pay | Admitting: Family Medicine

## 2018-05-12 DIAGNOSIS — E559 Vitamin D deficiency, unspecified: Secondary | ICD-10-CM

## 2018-05-14 DIAGNOSIS — Z96651 Presence of right artificial knee joint: Secondary | ICD-10-CM | POA: Diagnosis not present

## 2018-05-14 DIAGNOSIS — M1611 Unilateral primary osteoarthritis, right hip: Secondary | ICD-10-CM | POA: Diagnosis not present

## 2018-05-19 DIAGNOSIS — M1611 Unilateral primary osteoarthritis, right hip: Secondary | ICD-10-CM | POA: Diagnosis not present

## 2018-05-26 DIAGNOSIS — M1611 Unilateral primary osteoarthritis, right hip: Secondary | ICD-10-CM | POA: Diagnosis not present

## 2018-05-29 DIAGNOSIS — M1612 Unilateral primary osteoarthritis, left hip: Secondary | ICD-10-CM | POA: Diagnosis not present

## 2018-05-29 DIAGNOSIS — M1611 Unilateral primary osteoarthritis, right hip: Secondary | ICD-10-CM | POA: Diagnosis not present

## 2018-05-29 DIAGNOSIS — Z96641 Presence of right artificial hip joint: Secondary | ICD-10-CM | POA: Diagnosis not present

## 2018-06-01 DIAGNOSIS — M1611 Unilateral primary osteoarthritis, right hip: Secondary | ICD-10-CM | POA: Diagnosis not present

## 2018-06-01 DIAGNOSIS — M1612 Unilateral primary osteoarthritis, left hip: Secondary | ICD-10-CM | POA: Diagnosis not present

## 2018-06-01 DIAGNOSIS — Z01812 Encounter for preprocedural laboratory examination: Secondary | ICD-10-CM | POA: Diagnosis not present

## 2018-06-04 DIAGNOSIS — M1611 Unilateral primary osteoarthritis, right hip: Secondary | ICD-10-CM | POA: Diagnosis not present

## 2018-06-08 DIAGNOSIS — M1612 Unilateral primary osteoarthritis, left hip: Secondary | ICD-10-CM | POA: Diagnosis not present

## 2018-06-09 DIAGNOSIS — M1612 Unilateral primary osteoarthritis, left hip: Secondary | ICD-10-CM | POA: Diagnosis not present

## 2018-06-11 DIAGNOSIS — M1612 Unilateral primary osteoarthritis, left hip: Secondary | ICD-10-CM | POA: Diagnosis not present

## 2018-06-11 DIAGNOSIS — M1611 Unilateral primary osteoarthritis, right hip: Secondary | ICD-10-CM | POA: Diagnosis not present

## 2018-06-16 ENCOUNTER — Encounter: Payer: Self-pay | Admitting: Family Medicine

## 2018-06-16 ENCOUNTER — Ambulatory Visit: Payer: BLUE CROSS/BLUE SHIELD | Admitting: Family Medicine

## 2018-06-16 VITALS — BP 140/82 | HR 64 | Temp 98.1°F | Ht 60.0 in | Wt 189.5 lb

## 2018-06-16 DIAGNOSIS — I10 Essential (primary) hypertension: Secondary | ICD-10-CM

## 2018-06-16 DIAGNOSIS — J0100 Acute maxillary sinusitis, unspecified: Secondary | ICD-10-CM | POA: Diagnosis not present

## 2018-06-16 MED ORDER — DOXYCYCLINE MONOHYDRATE 100 MG PO CAPS: 100.0000 mg | ORAL_CAPSULE | Freq: Two times a day (BID) | ORAL | 0 refills | Status: AC

## 2018-06-16 NOTE — Patient Instructions (Signed)
Based on your symptoms it looks like you may be close to developing a sinus infection  Antibiotics are not need for a viral infection but the following will help:   1. Drink plenty of fluids 2. Get lots of rest  Sinus Congestion - for 2 days 1) Neti Pot (Saline rinse) -- 2 times day -- if tolerated 2) Flonase (Store Brand ok) - once daily 3) Over the counter congestion medications  If not improved, continue saline rinse and start antibiotic If you are feeling better at 5 days of antibiotics then you can stop.    If you develop fevers (Temperature >100.4), chills, worsening symptoms or symptoms lasting longer than 10 days return to clinic.     Blood pressure - this was a little high today - check your blood pressure at home and bring to your physical in January

## 2018-06-16 NOTE — Assessment & Plan Note (Signed)
BP mildly elevated. Recommended home monitoring and to discuss with PCP at appointment in Jan

## 2018-06-16 NOTE — Progress Notes (Addendum)
Subjective:     Shannon Mckenzie is a 57 y.o. female presenting for Sinus Problem (x 1 week. Headaches, sinus pressure and pain, some post nasal drainage, ears itching, some nausea. Has used nasal spray. No fever that she knows of.)     Sinus Problem  This is a new problem. The current episode started 1 to 4 weeks ago. There has been no fever. The pain is moderate. Associated symptoms include chills, headaches and sinus pressure. Pertinent negatives include no congestion, coughing, ear pain, shortness of breath, sneezing or sore throat. Past treatments include acetaminophen and spray decongestants. The treatment provided mild relief.   #HTN - no cp, sob, vision changes - does not check BP at home - taking losartan  Review of Systems  Constitutional: Positive for chills.  HENT: Positive for postnasal drip, sinus pressure and sinus pain. Negative for congestion, ear pain, sneezing and sore throat.   Eyes: Positive for itching.  Respiratory: Negative for cough and shortness of breath.   Neurological: Positive for headaches.     Chart Review 04/29/2018: Clinic - cough - Albuterol and Azithromycin   Social History   Tobacco Use  Smoking Status Former Smoker  . Packs/day: 1.00  . Years: 15.00  . Pack years: 15.00  . Types: Cigarettes  . Last attempt to quit: 06/25/1991  . Years since quitting: 26.9  Smokeless Tobacco Never Used        Objective:    BP Readings from Last 3 Encounters:  06/16/18 140/82  04/29/18 138/84  04/29/18 (!) 149/80   Wt Readings from Last 3 Encounters:  06/16/18 189 lb 8 oz (86 kg)  04/29/18 179 lb 6.1 oz (81.4 kg)  04/29/18 174 lb (78.9 kg)    BP 140/82   Pulse 64   Temp 98.1 F (36.7 C)   Ht 5' (1.524 m)   Wt 189 lb 8 oz (86 kg)   LMP 03/13/2015   SpO2 100%   BMI 37.01 kg/m    Physical Exam Constitutional:      General: She is not in acute distress.    Appearance: She is well-developed. She is not diaphoretic.  HENT:     Head:  Normocephalic and atraumatic.     Right Ear: Tympanic membrane and ear canal normal.     Left Ear: Tympanic membrane and ear canal normal.     Nose: Mucosal edema and rhinorrhea present.     Right Sinus: Maxillary sinus tenderness present. No frontal sinus tenderness.     Left Sinus: No maxillary sinus tenderness or frontal sinus tenderness.     Mouth/Throat:     Pharynx: Uvula midline. Posterior oropharyngeal erythema present. No oropharyngeal exudate.     Tonsils: Swelling: 0 on the right. 0 on the left.  Eyes:     General: No scleral icterus.    Conjunctiva/sclera: Conjunctivae normal.  Neck:     Musculoskeletal: Neck supple.  Cardiovascular:     Rate and Rhythm: Normal rate and regular rhythm.     Heart sounds: Murmur (systolic, RUSB) present.  Pulmonary:     Effort: Pulmonary effort is normal. No respiratory distress.     Breath sounds: Normal breath sounds.  Musculoskeletal:     Comments: Ambulating with a walker  Lymphadenopathy:     Cervical: No cervical adenopathy.  Skin:    General: Skin is warm and dry.     Capillary Refill: Capillary refill takes less than 2 seconds.  Neurological:     Mental  Status: She is alert.           Assessment & Plan:   Problem List Items Addressed This Visit      Cardiovascular and Mediastinum   Essential hypertension, benign    BP mildly elevated. Recommended home monitoring and to discuss with PCP at appointment in Jan      Relevant Medications   aspirin 325 MG tablet    Other Visit Diagnoses    Acute non-recurrent maxillary sinusitis    -  Primary   Relevant Medications   doxycycline (MONODOX) 100 MG capsule     Recommended 2 days of saline rinse/flonase and if not improved to start Abx  Return if symptoms worsen or fail to improve.  Lynnda ChildJessica R Bresha Hosack, MD

## 2018-06-18 DIAGNOSIS — M1612 Unilateral primary osteoarthritis, left hip: Secondary | ICD-10-CM | POA: Diagnosis not present

## 2018-06-18 DIAGNOSIS — M1611 Unilateral primary osteoarthritis, right hip: Secondary | ICD-10-CM | POA: Diagnosis not present

## 2018-06-23 DIAGNOSIS — M1612 Unilateral primary osteoarthritis, left hip: Secondary | ICD-10-CM | POA: Diagnosis not present

## 2018-06-23 DIAGNOSIS — M1611 Unilateral primary osteoarthritis, right hip: Secondary | ICD-10-CM | POA: Diagnosis not present

## 2018-06-26 ENCOUNTER — Other Ambulatory Visit (INDEPENDENT_AMBULATORY_CARE_PROVIDER_SITE_OTHER): Payer: Self-pay | Admitting: Family Medicine

## 2018-06-26 DIAGNOSIS — E559 Vitamin D deficiency, unspecified: Secondary | ICD-10-CM

## 2018-06-26 DIAGNOSIS — M1611 Unilateral primary osteoarthritis, right hip: Secondary | ICD-10-CM | POA: Diagnosis not present

## 2018-06-26 DIAGNOSIS — M1612 Unilateral primary osteoarthritis, left hip: Secondary | ICD-10-CM | POA: Diagnosis not present

## 2018-06-29 DIAGNOSIS — M1611 Unilateral primary osteoarthritis, right hip: Secondary | ICD-10-CM | POA: Diagnosis not present

## 2018-06-29 DIAGNOSIS — M1612 Unilateral primary osteoarthritis, left hip: Secondary | ICD-10-CM | POA: Diagnosis not present

## 2018-07-02 DIAGNOSIS — M1612 Unilateral primary osteoarthritis, left hip: Secondary | ICD-10-CM | POA: Diagnosis not present

## 2018-07-02 DIAGNOSIS — M1611 Unilateral primary osteoarthritis, right hip: Secondary | ICD-10-CM | POA: Diagnosis not present

## 2018-07-06 DIAGNOSIS — M1612 Unilateral primary osteoarthritis, left hip: Secondary | ICD-10-CM | POA: Diagnosis not present

## 2018-07-06 DIAGNOSIS — M1611 Unilateral primary osteoarthritis, right hip: Secondary | ICD-10-CM | POA: Diagnosis not present

## 2018-07-09 ENCOUNTER — Other Ambulatory Visit: Payer: Self-pay | Admitting: Family Medicine

## 2018-07-13 DIAGNOSIS — M1611 Unilateral primary osteoarthritis, right hip: Secondary | ICD-10-CM | POA: Diagnosis not present

## 2018-07-13 DIAGNOSIS — M1612 Unilateral primary osteoarthritis, left hip: Secondary | ICD-10-CM | POA: Diagnosis not present

## 2018-07-16 ENCOUNTER — Encounter: Payer: Self-pay | Admitting: Family Medicine

## 2018-07-16 ENCOUNTER — Ambulatory Visit (INDEPENDENT_AMBULATORY_CARE_PROVIDER_SITE_OTHER): Payer: BLUE CROSS/BLUE SHIELD | Admitting: Family Medicine

## 2018-07-16 VITALS — BP 130/88 | HR 76 | Temp 98.1°F | Ht 60.0 in | Wt 189.0 lb

## 2018-07-16 DIAGNOSIS — R011 Cardiac murmur, unspecified: Secondary | ICD-10-CM | POA: Diagnosis not present

## 2018-07-16 DIAGNOSIS — E559 Vitamin D deficiency, unspecified: Secondary | ICD-10-CM | POA: Diagnosis not present

## 2018-07-16 DIAGNOSIS — Z23 Encounter for immunization: Secondary | ICD-10-CM | POA: Diagnosis not present

## 2018-07-16 DIAGNOSIS — Z0000 Encounter for general adult medical examination without abnormal findings: Secondary | ICD-10-CM

## 2018-07-16 DIAGNOSIS — Z6836 Body mass index (BMI) 36.0-36.9, adult: Secondary | ICD-10-CM

## 2018-07-16 DIAGNOSIS — F325 Major depressive disorder, single episode, in full remission: Secondary | ICD-10-CM

## 2018-07-16 LAB — VITAMIN D 25 HYDROXY (VIT D DEFICIENCY, FRACTURES): VITD: 42.73 ng/mL (ref 30.00–100.00)

## 2018-07-16 NOTE — Progress Notes (Signed)
Phone: [phone]  Subjective:  Patient presents today for their annual physical. Chief complaint-noted.   See problem oriented charting- ROS- full  review of systems was completed and negative except for: fatigue, dry eyes, increased urination, joint pain, back pain, muscle aches, anxiety, agitation  The following were reviewed and entered/updated in epic: Past Medical History:  Diagnosis Date  . Allergy   . Anemia   . Anxiety   . Asthma   . Depression   . DJD (degenerative joint disease) of hip   . Former smoker   . GERD (gastroesophageal reflux disease)   . Hand pain, right   . Hiatal hernia   . History of kidney stones    history  . Hyperlipidemia   . Hypertension   . Kidney stone   . Osteoarthritis   . Pain, hip    right  . Pneumonia    hx but no hospitalization  . Reactive airway disease   . RLS (restless legs syndrome)   . Routine gynecological examination    sees gynecology, [name] NP  . SOB (shortness of breath) 9/14   cardiac eval with stress test and echo; Dr. [name]  . Statin intolerance   . Vision problems    wears contact lenses   Patient Active Problem List   Diagnosis Date Noted  . Major depression in full remission (HCC) 05/08/2017    Priority: High  . GERD (gastroesophageal reflux disease) 01/10/2018    Priority: Medium  . Carpal tunnel syndrome 05/26/2017    Priority: Medium  . Former smoker 05/26/2017    Priority: Medium  . GAD (generalized anxiety disorder) 05/08/2017    Priority: Medium  . Osteoarthritis 03/13/2015    Priority: Medium  . Anemia 04/16/2013    Priority: Medium  . Extrinsic asthma, mild persistent, uncomplicated 03/20/2013    Priority: Medium  . Upper airway cough syndrome ? all related to sinus dz 03/19/2013    Priority: Medium  . Essential hypertension, benign 04/15/2012    Priority: Medium  . Hyperlipidemia 04/15/2012    Priority: Medium  . Allergic rhinitis 05/26/2017    Priority: Low  . Iron  deficiency 02/20/2016    Priority: Low  . Cough 03/13/2015    Priority: Low  . Back pain 11/10/2014    Priority: Low  . Rash and nonspecific skin eruption 07/21/2014    Priority: Low  . Cold feeling 05/03/2014    Priority: Low  . Sinusitis, chronic 06/14/2013    Priority: Low  . Obesity 04/15/2012    Priority: Low  . Murmur 07/16/2018   Past Surgical History:  Procedure Laterality Date  . bilateral hip replacement     nov 21st 2019 right, Jun 09 2018 left   . CARPAL TUNNEL RELEASE Left 07/03/2017   Procedure: LEFT CARPAL TUNNEL RELEASE;  Surgeon: Betha Loa, MD;  Location: [facility];  Service: Orthopedics;  Laterality: Left;  . COLONOSCOPY  06/2012   Dr. Ramon Dredge; repeat 10 years  . UPPER GI ENDOSCOPY N/A 07/24/2012   Hiatus Hernia,normal stomach, normal examined duodenum  . WISDOM TOOTH EXTRACTION      Family History  Problem Relation Age of Onset  . Arthritis Mother   . Asthma Mother   . Diabetes Mother   . Cirrhosis Mother        died of liver nonalcoholic cirrhosis  . COPD Mother   . Heart disease Mother        CHF  . Hypertension Mother   .  Kidney disease Mother   . Liver disease Mother   . Eating disorder Mother   . Obesity Mother   . Asthma Sister        doesnt know history well  . Drug abuse Sister   . Cancer Father        died of pancreatic cancer at age 90  . Alcohol abuse Father   . Depression Brother   . Alcohol abuse Brother   . Stroke Maternal Aunt        died age 55yo of CVA  . Other Brother        died of MVA  . Other Sister   . Drug abuse Sister        doesnt know history well    Medications- reviewed and updated Current Outpatient Medications  Medication Sig Dispense Refill  . acetaminophen (TYLENOL) 325 MG tablet Take 650 mg by mouth every 8 (eight) hours as needed.    Marland Kitchen albuterol (PROVENTIL HFA;VENTOLIN HFA) 108 (90 Base) MCG/ACT inhaler Inhale 2 puffs into the lungs every 6 (six) hours as needed for wheezing or  shortness of breath. 1 Inhaler 2  . aspirin 325 MG tablet Take 325 mg by mouth 2 (two) times daily.    . baclofen (LIORESAL) 10 MG tablet Take 10 mg by mouth 3 (three) times daily as needed for muscle spasms.    Marland Kitchen BREO ELLIPTA 100-25 MCG/INH AEPB INHALE 1 PUFF INTO THE LUNGS DAILY 60 each 2  . cholecalciferol (VITAMIN D3) 25 MCG (1000 UT) tablet Take 1,000 Units by mouth daily.    . Cyanocobalamin (B-12) 1000 MCG CAPS Take by mouth.    . diclofenac (VOLTAREN) 75 MG EC tablet Take 1 tablet by mouth 2 (two) times daily.   2  . escitalopram (LEXAPRO) 20 MG tablet Take 20 mg by mouth daily.    Marland Kitchen esomeprazole (NEXIUM) 40 MG capsule Take 1 capsule (40 mg total) by mouth 2 (two) times daily. 180 capsule 3  . ezetimibe (ZETIA) 10 MG tablet TAKE 1 TABLET(10 MG) BY MOUTH DAILY 30 tablet 5  . fluticasone (FLONASE) 50 MCG/ACT nasal spray Place 1 spray into both nostrils daily.    Marland Kitchen losartan (COZAAR) 50 MG tablet TAKE 1 TABLET(50 MG) BY MOUTH DAILY 90 tablet 1  . Melatonin 5 MG TABS Take by mouth.    . montelukast (SINGULAIR) 10 MG tablet TAKE 1 TABLET BY MOUTH EVERY NIGHT AT BEDTIME 90 tablet 3  . Vitamin D, Ergocalciferol, (DRISDOL) 50000 units CAPS capsule Take 1 capsule (50,000 Units total) by mouth every 7 (seven) days. (Patient not taking: Reported on 07/16/2018) 4 capsule 0   No current facility-administered medications for this visit.     Allergies-reviewed and updated Allergies  Allergen Reactions  . Statins     Myalgias with all statins  . Amoxicillin Hives and Rash    Social History   Social History Narrative   Married, 1 biological son, 1 adopted son.        Faith: Christian. Denies any religious beliefs that would effect health care.       Cleans homes for a living.       Fun: Watch movies, be outside and work in the yard, photography    Objective: BP 130/88   Pulse 76   Temp 98.1 F (36.7 C) (Oral)   Ht 5' (1.524 m)   Wt 189 lb (85.7 kg)   LMP 03/13/2015   SpO2 98%    BMI 36.91 kg/m  Gen: NAD, resting comfortably HEENT: Mucous membranes are moist. Oropharynx normal Cerumen noted right ear- not able to remove with curette-patient is going to trial mineral Neck: no thyromegaly CV: RRR no rubs or gallops.  3 out of 6 systolic ejection murmur right upper sternal border Lungs: CTAB no crackles, wheeze, rhonchi Abdomen: soft/nontender/nondistended/normal bowel sounds. No rebound or guarding.  Ext: no edema Skin: warm, dry Neuro: grossly normal, moves all extremities, PERRLA  Assessment/Plan:  58 y.o. female presenting for annual physical.  Health Maintenance counseling: 1. Anticipatory guidance: Patient counseled regarding regular dental exams -q6 months, eye exams - needs update,  avoiding smoking and second hand smoke , limiting alcohol to 1 beverage per day- doesn't drink .   2. Risk factor reduction:  Advised patient of need for regular exercise and diet rich and fruits and vegetables to reduce risk of heart attack and stroke. Exercise- doing PT for hips after hip surgery. Diet-had been going to weigh to wellness- got down to 179 .  Weight down 3 pounds from last year but is up 10 lbs after her surgery.  Wt Readings from Last 3 Encounters:  07/16/18 189 lb (85.7 kg)  06/16/18 189 lb 8 oz (86 kg)  04/29/18 179 lb 6.1 oz (81.4 kg)  3. Immunizations/screenings/ancillary studies-discussed Shingrix today  Immunization History  Administered Date(s) Administered  . Influenza Split 07/06/2011, 03/20/2012  . Influenza Whole 04/02/2001, 04/23/2002  . Influenza,inj,Quad PF,6+ Mos 03/19/2013, 02/23/2014  . Influenza-Unspecified 03/24/2017  . Pneumococcal Polysaccharide-23 03/20/2012  . Td 02/16/2005  . Tdap 07/06/2014  4. Cervical cancer screening- last documented from August 2018 but gets yearly. 5. Breast cancer screening-  breast exam with gynecology and mammogram -we called to get records today 6. Colon cancer screening -07/24/2012 with 10-year follow-up    7. Skin cancer screening-no dermatologist   Advised regular sunscreen use. Denies worrisome, changing, or new skin lesions.  8. Birth control/STD check- postmenopausal and monogamous 9. Osteoporosis screening at [age]- we will plan to screen at age 58 10.  Former smoker-15 pack years but quit in the 1990s  Status of chronic or acute concerns   Other notes: 1. Vitamin D deficiency- took 50 k units burst now back on 1000-update vitamin D today 2.Just had labs done in November with her husband's drop-we defer these today as a result  -Dehydration noted on labs with high BUN to creatinine ratio.  -Mild lipid improvement with LDL down to 167  -No increased diabetes risk with A1c of 5.1 3. Polyuria- wants to talk to gynecologist- wearing pads and gets irritated - vaseline seems to help. Prior detrol but didn't help thorugh GYN   #Hypertension S: Patient compliant with losartan at 50 mg. She didn't do any home monitoring A/P: Poor control today- on first check- better on recheck with high diastolic- we are going to plan on a 6 month follow up - give her more time post surgery   #Hyperlipidemia S: Patient compliant and Zetia.  LDL goal closer to 100 but has myalgias.  LDL came down slightly to 409167 from over 170 previously. Didn't tolerate rosuvastatin A/P: poor control but   Stable. Continue current medications.  Do not think we can titrate further due to myalgias   #Asthma S: Patient remains on Singulair and Breo.  Infrequent albuterol use. A/P: Stable. Continue current medications.      #Osteoarthritis S: Has seen physical therapy and rheumatology in the past-still seeing rheumatology.  She remains on Voltaren 75 mg twice a day  for osteoarthritis.  Has done really well after bilateral hip surgery.   A/P: stable- continue meds and monitor BMP- arthritis intolerable without meds so for now benefits outweigh risks     #GERD S: Compliant with Nexium 40 mg A/P: stable- b12 levels normal in  august 2019   Lab Results  Component Value Date   VITAMINB12 646 02/19/2018    #Anemia S: Patient with history of EGD and colonoscopy back in 2016 for iron deficiency anemia.  She has been on iron since 2014 and CBC is largely stable A/P: CBC was just done in August-we will defer today   #Depression/GAD S: Controlled on Lexapro 20 mg.  See PHQ 9 flowsheet.  Seeing counselor in past- she stopped. Does get agitation when all grandkids get around . Still on melatonin for sleep A/P: largely stable- discussed adding counseling back in to try to help.    BMI monitoring- elevated BMI noted: Body mass index is 36.91 kg/m. Encouraged need for healthy eating, regular exercise, weight loss.  Morbid obesity technically morbid due to HTN/HLD  Murmur Murmur noted at visit with Dr. Selena Battenody- we will get an echocardiogram  - noted again today  Return in about 6 months (around 01/14/2019) for follow up- or sooner if needed.  Lab/Order associations: nonfasting labs today  Preventative health care  Vitamin D deficiency - Plan: VITAMIN D 25 Hydroxy (Vit-D Deficiency, Fractures)  Murmur - Plan: ECHOCARDIOGRAM COMPLETE  BMI 36.0-36.9,adult  Class 2 severe obesity with serious comorbidity and body mass index (BMI) of 36.0 to 36.9 in adult, unspecified obesity type (HCC), Chronic  Major depressive disorder with single episode, in full remission (HCC), Chronic  Return precautions advised.  Tana ConchStephen Donnis Phaneuf, MD

## 2018-07-16 NOTE — Patient Instructions (Addendum)
Health Maintenance Due  Topic Date Due  . MAMMOGRAM -[facility], they are faxing over report 12/13/2017   Lets do 6 month blood pressure recheck  Shingrix #1 today. Repeat injection in 2-5 months. Schedule a nurse visit for the 2nd injection before you leave today (at the check out desk)  We will call you within two weeks about your referral for echocardiogram. If you do not hear within 3 weeks, give [place] a call.     Please stop by lab before you go If you do not have mychart- we will call you about results within 5 business days of [place] receiving them.  If you have mychart- we will send your results within 3 business days of [place] receiving them.  If abnormal or we want to clarify a result, we will call or mychart you to make sure you receive the message.  If you have questions or concerns or don't hear within 5-7 days, please send [place] a message or call us.

## 2018-07-16 NOTE — Assessment & Plan Note (Signed)
Murmur noted at visit with Dr. Selena Batten- we will get an echocardiogram

## 2018-07-16 NOTE — Addendum Note (Signed)
Addended by: Heide Spark M on: 07/16/2018 11:16 AM   Modules accepted: Orders

## 2018-07-22 IMAGING — DX DG CHEST 2V
2 series · 2 of 2 positions shown · non-contrast
Comparison: PA and lateral chest x-ray January 17, 2016

CLINICAL DATA: Shortness of breath, productive cough for the past
3-4 days. History of asthma, former smoker.

EXAM:
CHEST  2 VIEW

[chest pa]
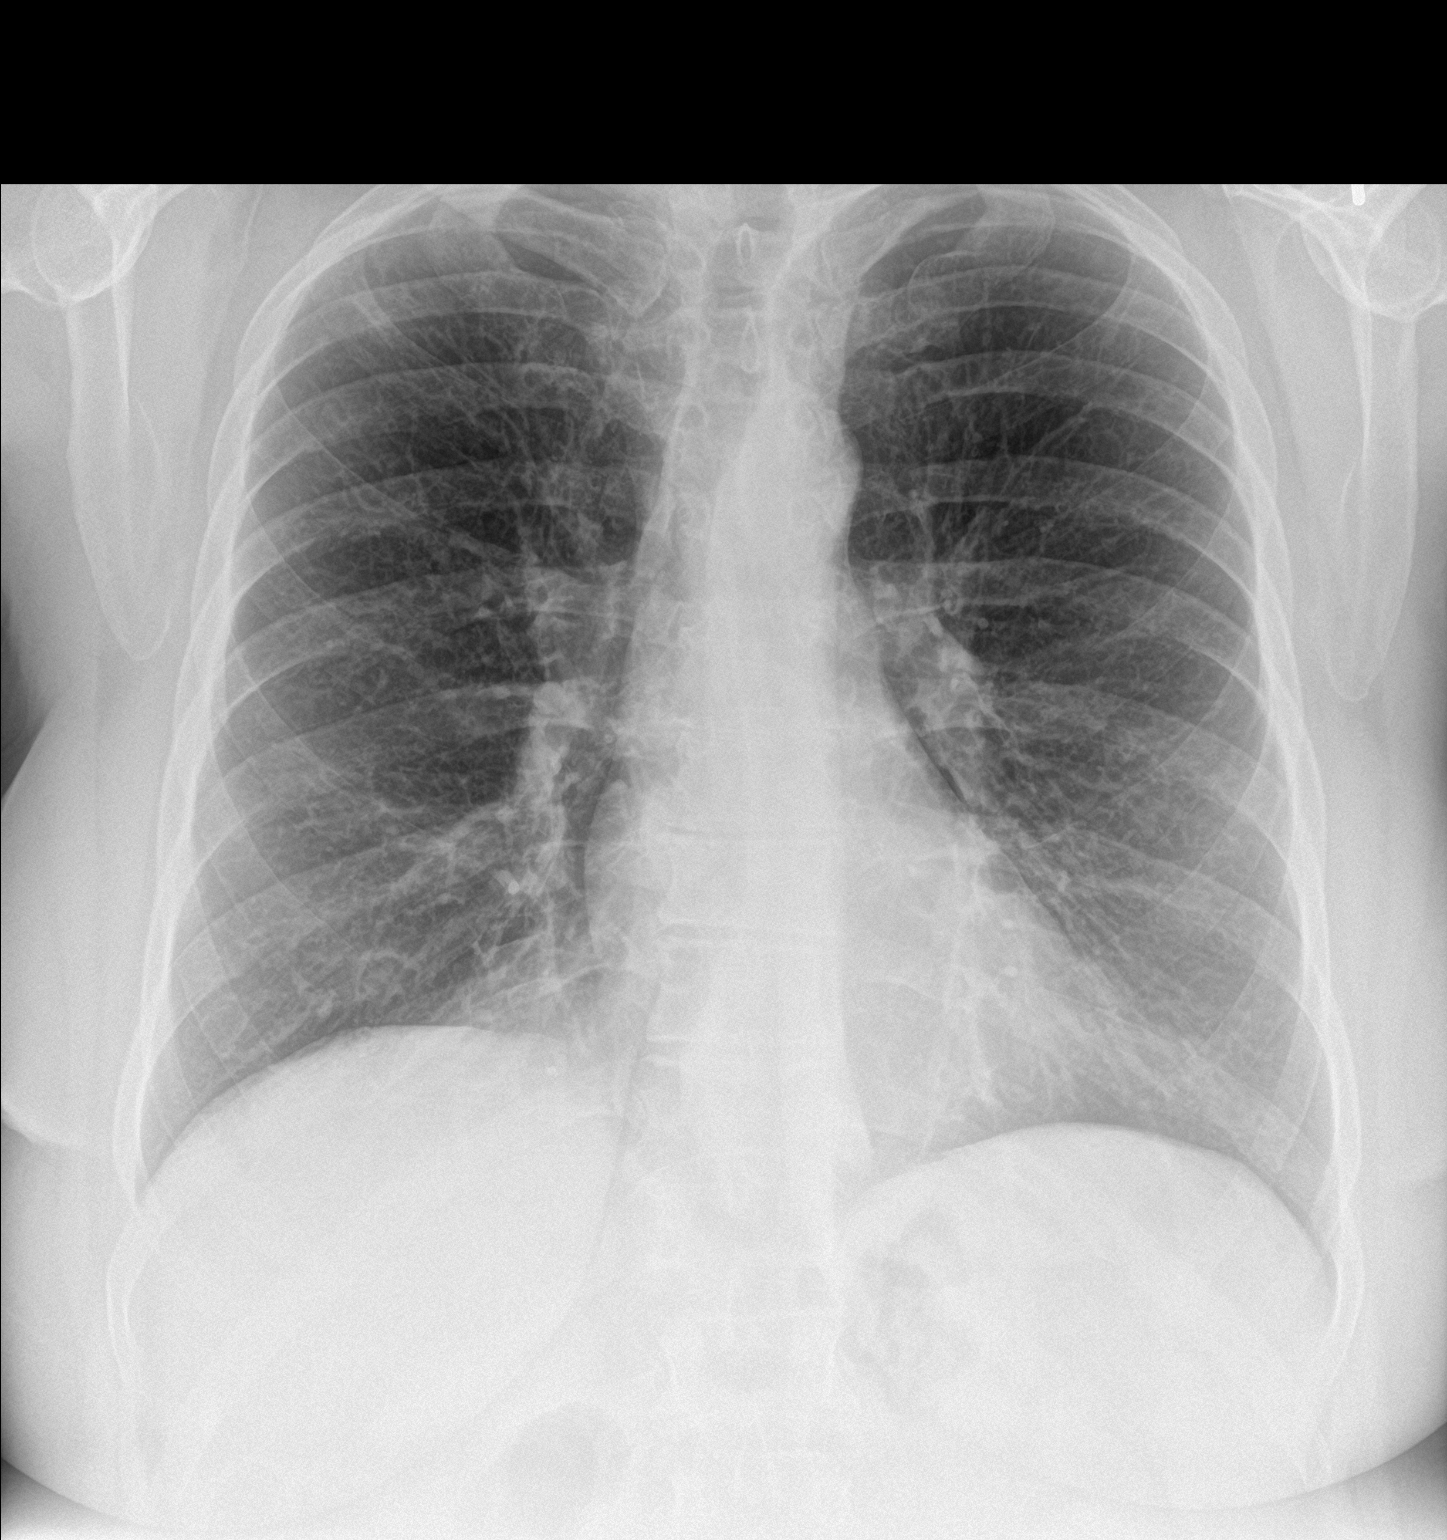

[chest lat]
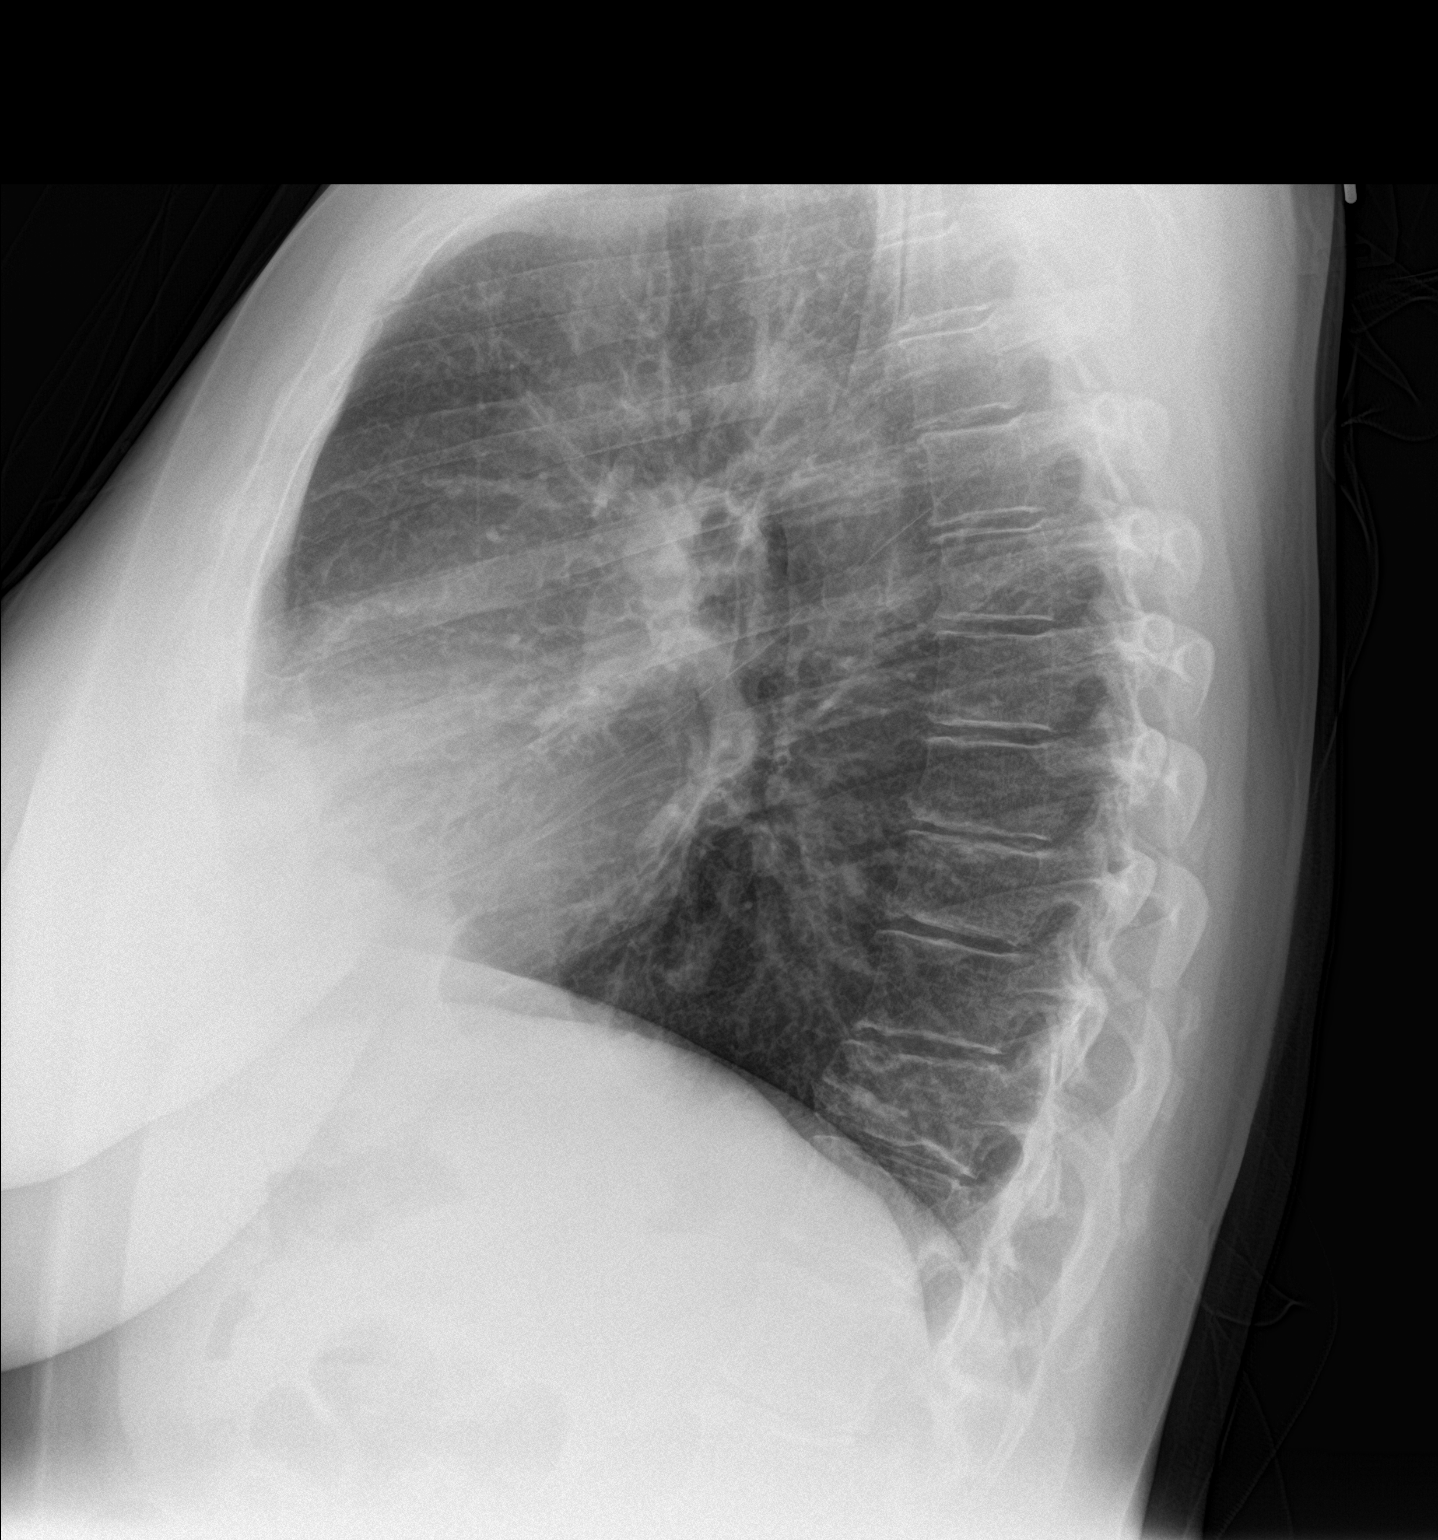

[2 of 2 positions shown; findings below may reference images not displayed]

FINDINGS: The lungs are adequately inflated. Patchy infiltrate demonstrated on
the previous study in the right lung has resolved. No acute
infiltrate is observed. There is no pleural effusion. The heart and
pulmonary vascularity are normal. The trachea is midline. There is
multilevel degenerative disc disease of the thoracic spine.
IMPRESSION: There is no pneumonia nor other acute cardiopulmonary abnormality.

## 2018-08-07 DIAGNOSIS — H5203 Hypermetropia, bilateral: Secondary | ICD-10-CM | POA: Diagnosis not present

## 2018-08-13 ENCOUNTER — Telehealth: Payer: Self-pay | Admitting: Family Medicine

## 2018-08-13 NOTE — Telephone Encounter (Signed)
I called [facility] yesterday - they tried calling the patient when the order was placed and never got any return phone calls.  They will call her again to try to schedule.  I will also reach out to her to make sure they contacted her.

## 2018-08-13 NOTE — Telephone Encounter (Signed)
Copied from CRM [id number]. Topic: General - Other >> Aug 12, 2018 12:27 PM Gerrianne Scale wrote: Reason for CRM: pt calling stating that she was waiting on a call for a ECHOCARDIOGRAM referral  called Judeth Cornfield she wasn't at desk

## 2018-08-18 NOTE — Telephone Encounter (Signed)
Patient is scheduled for echo tomorrow at 2:30pm. Was having palpitations last night but not currently. Advised if any palpitations or other occurs again between now and then to call right away and speak with a nurse.

## 2018-08-18 NOTE — Telephone Encounter (Signed)
See note

## 2018-08-18 NOTE — Telephone Encounter (Signed)
Spoke to pt and she stated that she is okay and can wait until tomorrow. Pt stated that if it gets worse she will go to the ER. Pt was also advised that we have a 320 today with our NP but she declined. She stated Dr. Durene Cal already told her she has a heart murmur so she will wait for her appt. No further needed at this time.

## 2018-08-19 ENCOUNTER — Ambulatory Visit (HOSPITAL_COMMUNITY): Payer: BLUE CROSS/BLUE SHIELD | Attending: Internal Medicine

## 2018-08-19 DIAGNOSIS — R011 Cardiac murmur, unspecified: Secondary | ICD-10-CM | POA: Insufficient documentation

## 2018-08-20 ENCOUNTER — Telehealth: Payer: Self-pay | Admitting: Family Medicine

## 2018-08-20 NOTE — Telephone Encounter (Signed)
Copied from CRM [id number]. Topic: Quick Communication - See Telephone Encounter >> Aug 20, 2018  9:36 AM Angela Nevin wrote: CRM for notification. See Telephone encounter for: 08/20/18.   Patient would like to know if Dr. Durene Cal has had the chance to review echocardiogram from 2/26 yet.  Please advise.

## 2018-08-20 NOTE — Telephone Encounter (Signed)
See note

## 2018-08-21 NOTE — Telephone Encounter (Signed)
Done see note from 08/20/2018

## 2018-08-28 ENCOUNTER — Ambulatory Visit: Payer: BLUE CROSS/BLUE SHIELD | Admitting: Family Medicine

## 2018-08-28 ENCOUNTER — Encounter: Payer: Self-pay | Admitting: Family Medicine

## 2018-08-28 ENCOUNTER — Telehealth: Payer: Self-pay | Admitting: Family Medicine

## 2018-08-28 VITALS — BP 140/80 | HR 87 | Temp 98.0°F | Ht 60.0 in | Wt 196.0 lb

## 2018-08-28 DIAGNOSIS — B9789 Other viral agents as the cause of diseases classified elsewhere: Secondary | ICD-10-CM

## 2018-08-28 DIAGNOSIS — J069 Acute upper respiratory infection, unspecified: Secondary | ICD-10-CM

## 2018-08-28 NOTE — Patient Instructions (Signed)
Good to see you today  Please use your Flonase, 2 sprays in each nostril at bedtime, continue your Allegra, add Mucinex DM- generic is fine.  Use your albuterol inhaler every 4-6 hours as needed for wheeze, chest tightness, cough.  Follow-up if you are not better in 5 to 7 days, you are having wheezing or shortness of breath or fever over 101  Schedule an appointment to establish care at [facility] in the next couple of months

## 2018-08-28 NOTE — Telephone Encounter (Signed)
OK with me.

## 2018-08-28 NOTE — Progress Notes (Signed)
Subjective:    Patient ID: Shannon Mckenzie, female    DOB: 1960/11/03, 58 y.o.   MRN: [id number]  HPI  This is a 58 yo female being seen today for upper respiratory symptoms which began 5 days ago at night. Was experiencing symptoms of reflux, started coughing and took Tums for reflux relief, also has runny nose with clear mucus, thick sputum with cough and itchy ears. No coughing/SOB/mild wheezing but not using inhaler for symptoms.  Uses Allegra 24 hour, Tylenol, and Flonase daily for symptom relief.  Past Medical History:  Diagnosis Date  . Allergy   . Anemia   . Anxiety   . Asthma   . Depression   . DJD (degenerative joint disease) of hip   . Former smoker   . GERD (gastroesophageal reflux disease)   . Hand pain, right   . Hiatal hernia   . History of kidney stones    history  . Hyperlipidemia   . Hypertension   . Kidney stone   . Osteoarthritis   . Pain, hip    right  . Pneumonia    hx but no hospitalization  . Reactive airway disease   . RLS (restless legs syndrome)   . Routine gynecological examination    sees gynecology, [name] NP  . SOB (shortness of breath) 9/14   cardiac eval with stress test and echo; Dr. [name]  . Statin intolerance   . Vision problems    wears contact lenses   Past Surgical History:  Procedure Laterality Date  . bilateral hip replacement     nov 21st 2019 right, Jun 09 2018 left   . CARPAL TUNNEL RELEASE Left 07/03/2017   Procedure: LEFT CARPAL TUNNEL RELEASE;  Surgeon: Betha Loa, MD;  Location: [facility];  Service: Orthopedics;  Laterality: Left;  . COLONOSCOPY  06/2012   Dr. Ramon Dredge; repeat 10 years  . UPPER GI ENDOSCOPY N/A 07/24/2012   [place] Hernia,normal stomach, normal examined duodenum  . WISDOM TOOTH EXTRACTION     Family History  Problem Relation Age of Onset  . Arthritis Mother   . Asthma Mother   . Diabetes Mother   . Cirrhosis Mother        died of liver nonalcoholic cirrhosis  . COPD  Mother   . Heart disease Mother        CHF  . Hypertension Mother   . Kidney disease Mother   . Liver disease Mother   . Eating disorder Mother   . Obesity Mother   . Asthma Sister        doesnt know history well  . Drug abuse Sister   . Cancer Father        died of pancreatic cancer at age 57  . Alcohol abuse Father   . Depression Brother   . Alcohol abuse Brother   . Stroke Maternal Aunt        died age 87yo of CVA  . Other Brother        died of MVA  . Other Sister   . Drug abuse Sister        doesnt know history well   Social History   Tobacco Use  . Smoking status: Former Smoker    Packs/day: 1.00    Years: 15.00    Pack years: 15.00    Types: Cigarettes    Last attempt to quit: 06/25/1991    Years since quitting: 27.1  . Smokeless tobacco: Never  Used  Substance Use Topics  . Alcohol use: Yes    Alcohol/week: 0.0 standard drinks    Frequency: Never    Comment: margharita  . Drug use: No      Review of Systems Per HPI    Objective:   Physical Exam Vitals signs reviewed.  Constitutional:      Appearance: Normal appearance.  HENT:     Head: Normocephalic.     Right Ear: Tympanic membrane normal.     Left Ear: Tympanic membrane normal.     Nose:     Comments: Narrow nasal passages, congestion    Mouth/Throat:     Mouth: Mucous membranes are moist.     Comments: Post nasal drip Cardiovascular:     Heart sounds: Normal heart sounds.  Pulmonary:     Effort: Pulmonary effort is normal.     Breath sounds: Normal breath sounds.  Skin:    General: Skin is warm and dry.  Neurological:     Mental Status: She is alert and oriented to person, place, and time.  Psychiatric:        Mood and Affect: Mood normal.        Behavior: Behavior normal.     BP 140/80 (BP Location: Right Arm, Patient Position: Sitting, Cuff Size: Normal)   Pulse 87   Temp 98 F (36.7 C) (Oral)   Ht 5' (1.524 m)   Wt 196 lb (88.9 kg)   LMP 03/13/2015   SpO2 99%   BMI 38.28  kg/m   BP Readings from Last 3 Encounters:  08/28/18 140/80  07/16/18 130/88  06/16/18 140/82    Wt Readings from Last 3 Encounters:  08/28/18 196 lb (88.9 kg)  07/16/18 189 lb (85.7 kg)  06/16/18 189 lb 8 oz (86 kg)           Assessment & Plan:  Viral URI with cough  Patient Instructions  Good to see you today  Please use your Flonase, 2 sprays in each nostril at bedtime, continue your Allegra, add Mucinex DM- generic is fine.  Use your albuterol inhaler every 4-6 hours as needed for wheeze, chest tightness, cough.  Follow-up if you are not better in 5 to 7 days, you are having wheezing or shortness of breath or fever over 101  Schedule an appointment to establish care at [facility] in the next couple of months

## 2018-08-28 NOTE — Telephone Encounter (Signed)
Pt is requesting to transfer from Dr. [name] to Deboraha Sprang due to [facility] being closer. Is this okay?

## 2018-08-28 NOTE — Telephone Encounter (Signed)
Yes thanks- ok with me

## 2018-08-28 NOTE — Progress Notes (Signed)
Subjective:    Patient ID: Shannon Mckenzie, female    DOB: Aug 15, 1960, 58 y.o.   MRN: [id number]  HPI This is a 58 yo female who presents today with 5 days of cough, productive of thick mucus, feels that this is from post nasal drainage, only bothers her at night. Clear nasal drainage, no fever, no ear pain, some itching of ears, throat itching. A little wheezing, not enough to use her inhaler. Has taken Allegra and Tylenol without relief. Uses fluticasone nasal spray, one spray in each nostril daily. Uses Breo daily.   She typically sees Dr. Durene Cal, has moved recently and wants to establish care here.   Past Medical History:  Diagnosis Date  . Allergy   . Anemia   . Anxiety   . Asthma   . Depression   . DJD (degenerative joint disease) of hip   . Former smoker   . GERD (gastroesophageal reflux disease)   . Hand pain, right   . Hiatal hernia   . History of kidney stones    history  . Hyperlipidemia   . Hypertension   . Kidney stone   . Osteoarthritis   . Pain, hip    right  . Pneumonia    hx but no hospitalization  . Reactive airway disease   . RLS (restless legs syndrome)   . Routine gynecological examination    sees gynecology, [name] NP  . SOB (shortness of breath) 9/14   cardiac eval with stress test and echo; Dr. [name]  . Statin intolerance   . Vision problems    wears contact lenses   Past Surgical History:  Procedure Laterality Date  . bilateral hip replacement     nov 21st 2019 right, Jun 09 2018 left   . CARPAL TUNNEL RELEASE Left 07/03/2017   Procedure: LEFT CARPAL TUNNEL RELEASE;  Surgeon: Betha Loa, MD;  Location: [facility];  Service: Orthopedics;  Laterality: Left;  . COLONOSCOPY  06/2012   Dr. Ramon Dredge; repeat 10 years  . UPPER GI ENDOSCOPY N/A 07/24/2012   [place] Hernia,normal stomach, normal examined duodenum  . WISDOM TOOTH EXTRACTION     Family History  Problem Relation Age of Onset  . Arthritis Mother   . Asthma  Mother   . Diabetes Mother   . Cirrhosis Mother        died of liver nonalcoholic cirrhosis  . COPD Mother   . Heart disease Mother        CHF  . Hypertension Mother   . Kidney disease Mother   . Liver disease Mother   . Eating disorder Mother   . Obesity Mother   . Asthma Sister        doesnt know history well  . Drug abuse Sister   . Cancer Father        died of pancreatic cancer at age 89  . Alcohol abuse Father   . Depression Brother   . Alcohol abuse Brother   . Stroke Maternal Aunt        died age 56yo of CVA  . Other Brother        died of MVA  . Other Sister   . Drug abuse Sister        doesnt know history well   Social History   Tobacco Use  . Smoking status: Former Smoker    Packs/day: 1.00    Years: 15.00    Pack years: 15.00  Types: Cigarettes    Last attempt to quit: 06/25/1991    Years since quitting: 27.1  . Smokeless tobacco: Never Used  Substance Use Topics  . Alcohol use: Yes    Alcohol/week: 0.0 standard drinks    Frequency: Never    Comment: margharita  . Drug use: No      Review of Systems Per HPI    Objective:   Physical Exam Vitals signs reviewed.  Constitutional:      General: She is not in acute distress.    Appearance: Normal appearance. She is obese. She is not ill-appearing, toxic-appearing or diaphoretic.  HENT:     Head: Normocephalic and atraumatic.     Right Ear: Tympanic membrane, ear canal and external ear normal.     Left Ear: Tympanic membrane, ear canal and external ear normal.     Nose: Congestion present.     Mouth/Throat:     Mouth: Mucous membranes are moist.     Pharynx: Oropharynx is clear.     Comments: Large amount post nasal drainage.  Eyes:     Conjunctiva/sclera: Conjunctivae normal.  Neck:     Musculoskeletal: Normal range of motion and neck supple. No neck rigidity or muscular tenderness.  Cardiovascular:     Rate and Rhythm: Normal rate and regular rhythm.     Heart sounds: Normal heart sounds.   Pulmonary:     Effort: Pulmonary effort is normal.     Breath sounds: Normal breath sounds.  Lymphadenopathy:     Cervical: No cervical adenopathy.  Skin:    General: Skin is warm and dry.  Neurological:     Mental Status: She is alert and oriented to person, place, and time.  Psychiatric:        Mood and Affect: Mood normal.        Behavior: Behavior normal.        Thought Content: Thought content normal.        Judgment: Judgment normal.       BP 140/80 (BP Location: Right Arm, Patient Position: Sitting, Cuff Size: Normal)   Pulse 87   Temp 98 F (36.7 C) (Oral)   Ht 5' (1.524 m)   Wt 196 lb (88.9 kg)   LMP 03/13/2015   SpO2 99%   BMI 38.28 kg/m  Wt Readings from Last 3 Encounters:  08/28/18 196 lb (88.9 kg)  07/16/18 189 lb (85.7 kg)  06/16/18 189 lb 8 oz (86 kg)       Assessment & Plan:  1. Viral URI with cough - Provided written and verbal information regarding diagnosis and treatment. -  Patient Instructions  Good to see you today  Please use your Flonase, 2 sprays in each nostril at bedtime, continue your Allegra, add Mucinex DM- generic is fine.  Use your albuterol inhaler every 4-6 hours as needed for wheeze, chest tightness, cough.  Follow-up if you are not better in 5 to 7 days, you are having wheezing or shortness of breath or fever over 101  Schedule an appointment to establish care at [facility] in the next couple of months     Olean Ree, FNP-BC  [facility], [facility]  08/28/2018 10:46 AM

## 2018-08-31 ENCOUNTER — Telehealth: Payer: Self-pay | Admitting: Family Medicine

## 2018-08-31 NOTE — Telephone Encounter (Signed)
Called and spoke to patient at [phone] (mobile), she stated that she is not having a fever, is having some wheezing but is using her inhaler, no shortness of breath, when she coughs it produces green sputum, blowing her nose it is clear. Face is still hurting her and she has a lot of built up pressure in the nose and eye area. Please advise

## 2018-08-31 NOTE — Telephone Encounter (Signed)
Was seen 08/28/18 and not feeling any better- actually feels worse. Pt said she is coughing up green stuff. She drinking lots of water and she is taking the coricidum-hbp, muccinex, allergy pill and tylenol- following your plan but not getting better. She asks if anything can get called in- [facility] in [place] on [address].

## 2018-08-31 NOTE — Telephone Encounter (Signed)
Please call patient and tell her that it is not uncommon to have symptoms for up to 14 days with viral illness, continue symptomatic treatment and get extra rest and fluids. If not better in 3-4 days, follow up.

## 2018-08-31 NOTE — Telephone Encounter (Signed)
Spoke with patient at [phone] (mobile) and she is aware of message below. She is unhappy that she is not getting anything given to her to make her feel better. I discussed with patient that an antibiotic will not treat her viral illness that is not what a antibiotic does unfortunately. She asked about being contagious and we went over proper hand hygiene, how to cough without spreading the germs, and not to share food or drink with anyone. Patient verbalized understanding and will call back Wednesday or Thursday if she feels worse or no better.

## 2018-08-31 NOTE — Telephone Encounter (Signed)
Please call patient and find out if she has a fever? Wheezing? SOB? Cough productive of sputum all day? What are most bothersome symptoms?

## 2018-09-03 ENCOUNTER — Other Ambulatory Visit: Payer: Self-pay

## 2018-09-03 ENCOUNTER — Encounter: Payer: Self-pay | Admitting: Family Medicine

## 2018-09-03 ENCOUNTER — Ambulatory Visit (INDEPENDENT_AMBULATORY_CARE_PROVIDER_SITE_OTHER): Payer: BLUE CROSS/BLUE SHIELD | Admitting: Family Medicine

## 2018-09-03 DIAGNOSIS — J0100 Acute maxillary sinusitis, unspecified: Secondary | ICD-10-CM

## 2018-09-03 NOTE — Progress Notes (Signed)
See scanned form for more details  S: cough, runny nose, HA  Exam: maxillary sinus tenderness, clear lungs  A/P  Acute non-recurrent maxillary sinusitis  abx on paper prescription as computer down Doxycycline 100 mg BID x 7 days Saline rinse  [name]

## 2018-09-11 ENCOUNTER — Encounter: Payer: BLUE CROSS/BLUE SHIELD | Admitting: Family Medicine

## 2018-09-14 ENCOUNTER — Telehealth: Payer: Self-pay

## 2018-09-14 NOTE — Telephone Encounter (Signed)
Spoke with patient and r/s TOC appointment from 09/15/2018 to May 2020. Patient was also mentioning that she has albuterol nebulizer due to having asthma and uses it rarely/as needed. Patient wonders if her nebulizer solution is expired and no good if she purchased it about a year ago. She will check on the date and will let us know if there is an issue with this. She was also advised pharmacist may be able to tell her better how long this would be good for past expiration date. Advised patient we did not have a nebulizer on her med list and she said she uses this so rare that she did not mention it. Advised patient we need to add that so we know in case. Patient understood. Patient states she will discuss this with Dr. Selena Batten more at her visit in May unless something comes up before that.  Putting this for documentation

## 2018-09-15 ENCOUNTER — Encounter: Payer: BLUE CROSS/BLUE SHIELD | Admitting: Family Medicine

## 2018-09-16 DIAGNOSIS — Z96641 Presence of right artificial hip joint: Secondary | ICD-10-CM | POA: Diagnosis not present

## 2018-09-16 DIAGNOSIS — M1612 Unilateral primary osteoarthritis, left hip: Secondary | ICD-10-CM | POA: Diagnosis not present

## 2018-09-24 ENCOUNTER — Ambulatory Visit: Payer: BLUE CROSS/BLUE SHIELD

## 2018-10-01 ENCOUNTER — Other Ambulatory Visit: Payer: Self-pay | Admitting: Family Medicine

## 2018-10-07 ENCOUNTER — Telehealth: Payer: Self-pay | Admitting: Family Medicine

## 2018-10-07 NOTE — Telephone Encounter (Signed)
See note

## 2018-10-07 NOTE — Telephone Encounter (Signed)
Copied from CRM [id number]. Topic: Quick Communication - Rx Refill/Question >> Oct 07, 2018 12:23 PM Gwenlyn Fudge A wrote: Medication: BREO ELLIPTA 100-25 MCG/INH AEPB, fluticasone (FLONASE) 50 MCG/ACT nasal spray  Has the patient contacted their pharmacy? Yes.  Pt states pharmacy told her they could not fill prescription because it was sent over wrong from Dr. Isidore Moos.  (Agent: If no, request that the patient contact the pharmacy for the refill.) (Agent: If yes, when and what did the pharmacy advise?)  Preferred Pharmacy (with phone number or street name): [id number] [place], [place] - [address] AT [facility] OF [address] [place] [address] Phone: [phone] Fax: [phone] Not a 24 hour pharmacy; exact hours not known.    Agent: Please be advised that RX refills may take up to 3 business days. We ask that you follow-up with your pharmacy.

## 2018-10-08 ENCOUNTER — Other Ambulatory Visit: Payer: Self-pay

## 2018-10-08 NOTE — Telephone Encounter (Signed)
Refill RX Breo Ellipta 100-25 MCG Flonase 

## 2018-10-09 ENCOUNTER — Other Ambulatory Visit: Payer: Self-pay

## 2018-10-09 MED ORDER — FLUTICASONE PROPIONATE 50 MCG/ACT NA SUSP: 1.0000 | Freq: Every day | NASAL | 1 refills | Status: DC

## 2018-10-09 NOTE — Telephone Encounter (Signed)
Called pharmacy and was advised that Rx for Virgel Bouquet is ready for pick up. Flonase had not been refilled. Sent refill for Flonase. Called pt and left VM to call the office.

## 2018-10-13 ENCOUNTER — Ambulatory Visit: Payer: BLUE CROSS/BLUE SHIELD | Admitting: Family Medicine

## 2018-10-14 ENCOUNTER — Other Ambulatory Visit: Payer: Self-pay | Admitting: Family Medicine

## 2018-11-04 ENCOUNTER — Other Ambulatory Visit: Payer: Self-pay | Admitting: Family Medicine

## 2018-11-09 ENCOUNTER — Ambulatory Visit (INDEPENDENT_AMBULATORY_CARE_PROVIDER_SITE_OTHER): Payer: BLUE CROSS/BLUE SHIELD | Admitting: Family Medicine

## 2018-11-09 ENCOUNTER — Other Ambulatory Visit: Payer: Self-pay

## 2018-11-09 ENCOUNTER — Other Ambulatory Visit: Payer: Self-pay | Admitting: Family Medicine

## 2018-11-09 DIAGNOSIS — Z23 Encounter for immunization: Secondary | ICD-10-CM | POA: Diagnosis not present

## 2018-11-09 NOTE — Patient Instructions (Signed)
There are no preventive care reminders to display for this patient.  Depression screen [facility] 2/9 07/16/2018 02/19/2018 01/09/2018  Decreased Interest 0 2 0  Down, Depressed, Hopeless 0 1 0  PHQ - 2 Score 0 3 0  Altered sleeping 0 1 0  Tired, decreased energy 1 3 0  Change in appetite 1 2 0  Feeling bad or failure about yourself  0 1 0  Trouble concentrating 0 1 0  Moving slowly or fidgety/restless 0 1 0  Suicidal thoughts 0 0 0  PHQ-9 Score 2 12 0  Difficult doing work/chores Somewhat difficult - Not difficult at all

## 2018-11-09 NOTE — Progress Notes (Signed)
Shingrix given today by nursing team- they will document Immunization History  Administered Date(s) Administered  . Influenza Split 07/06/2011, 03/20/2012  . Influenza Whole 04/02/2001, 04/23/2002  . Influenza,inj,Quad PF,6+ Mos 03/19/2013, 02/23/2014  . Influenza-Unspecified 03/24/2017  . Pneumococcal Polysaccharide-23 03/20/2012  . Td 02/16/2005  . Tdap 07/06/2014  . Zoster Recombinat (Shingrix) 07/16/2018, 11/09/2018    [name]

## 2018-11-11 ENCOUNTER — Encounter: Payer: BLUE CROSS/BLUE SHIELD | Admitting: Family Medicine

## 2018-11-12 ENCOUNTER — Other Ambulatory Visit: Payer: Self-pay

## 2018-11-12 ENCOUNTER — Encounter: Payer: Self-pay | Admitting: Family Medicine

## 2018-11-12 ENCOUNTER — Ambulatory Visit: Payer: BLUE CROSS/BLUE SHIELD | Admitting: Family Medicine

## 2018-11-12 VITALS — BP 152/84 | HR 72 | Temp 98.1°F | Ht 62.0 in | Wt 198.2 lb

## 2018-11-12 DIAGNOSIS — I10 Essential (primary) hypertension: Secondary | ICD-10-CM

## 2018-11-12 DIAGNOSIS — J4530 Mild persistent asthma, uncomplicated: Secondary | ICD-10-CM

## 2018-11-12 DIAGNOSIS — M1990 Unspecified osteoarthritis, unspecified site: Secondary | ICD-10-CM | POA: Diagnosis not present

## 2018-11-12 MED ORDER — ALBUTEROL SULFATE (2.5 MG/3ML) 0.083% IN NEBU: 2.5000 mg | INHALATION_SOLUTION | Freq: Four times a day (QID) | RESPIRATORY_TRACT | 1 refills | Status: DC | PRN

## 2018-11-12 MED ORDER — DICLOFENAC SODIUM 75 MG PO TBEC: 75.0000 mg | DELAYED_RELEASE_TABLET | Freq: Two times a day (BID) | ORAL | 1 refills | Status: DC

## 2018-11-12 NOTE — Assessment & Plan Note (Signed)
Will do home monitoring. If high will increase losartan to 100. Lengthy conversation about how NSAID could be worsening HTN. See plan below

## 2018-11-12 NOTE — Assessment & Plan Note (Signed)
Taking diclofenac BID and notes significant pain with decreased doses. Long discussion about risk of MI/Stroke as well as worsening BP and GERD. Suggested trial of different approach per patient instructions below. Refill provided to try to wean to prn use.

## 2018-11-12 NOTE — Patient Instructions (Addendum)
Arthritis 1) Try tylenol 1000 mg two-3 times a day 2) If you notice that only some joints are bothers are you > then you could try just the topical diclofenac  3) Glucosamine and chondroitin    Your blood pressure high.   High blood pressure increases your risk for heart attack and stroke.    Please check your blood pressure 2-4 times a week.   To check your blood pressure 1) Sit in a quiet and relaxed place for 5 minutes 2) Make sure your feet are flat on the ground 3) Consider checking first thing in the morning   Normal blood pressure is less than 140/90 Ideally you blood pressure should be around 120/80  Other ways you can reduce your blood pressure:  1) Regular exercise -- Try to get 150 minutes (30 minutes, 5 days a week) of moderate to vigorous aerobic excercise -- Examples: brisk walking (2.5 miles per hour), water aerobics, dancing, gardening, tennis, biking slower than 10 miles per hour 2) DASH Diet - low fat meats, more fresh fruits and vegetables, whole grains, low salt 3) Loose 5-10% of your body weight

## 2018-11-12 NOTE — Assessment & Plan Note (Signed)
Stable. Refill nebulizer solution so that it does not expire.

## 2018-11-12 NOTE — Progress Notes (Signed)
Subjective:     Shannon Mckenzie is a 58 y.o. female presenting for Transfer of Care (from Dr. Durene Cal); Asthma (would like to have refill on nebulizer to use as needed); and Joint Pain (would like to discuss Voltaren medication)     HPI   #Asthma - would like refill of nebulizer - will get occasional symptoms - some difficulty breathing - rare use  #Hip pain/arthritis - recent hip replacement  - Dr. Dion Saucier - does get pain if she is not taking the medication - has osteoarthritis - rheumatologist didn't catch it initially - was so bad she could not walk - last check up - she went for 6 months - Hip replacement November 2019 and Dec 2019 - tried decreasing to 1 time daily and still had pain - not just the hip pain, mostly whole body arthritis  #HTN - can check at home  - no cp, leg swelling, HA   Review of Systems  Constitutional: Negative for chills and fever.  Eyes: Negative for visual disturbance.  Respiratory: Positive for shortness of breath (occasional, chronic).   Cardiovascular: Negative for chest pain and palpitations.  Musculoskeletal: Positive for arthralgias.  Neurological: Negative for headaches.     Social History   Tobacco Use  Smoking Status Former Smoker  . Packs/day: 1.00  . Years: 15.00  . Pack years: 15.00  . Types: Cigarettes  . Last attempt to quit: 06/25/1991  . Years since quitting: 27.4  Smokeless Tobacco Never Used        Objective:    BP Readings from Last 3 Encounters:  11/12/18 (!) 152/84  08/28/18 140/80  07/16/18 130/88   Wt Readings from Last 3 Encounters:  11/12/18 198 lb 4 oz (89.9 kg)  08/28/18 196 lb (88.9 kg)  07/16/18 189 lb (85.7 kg)    BP (!) 152/84   Pulse 72   Temp 98.1 F (36.7 C)   Ht 5\' 2"  (1.575 m)   Wt 198 lb 4 oz (89.9 kg)   LMP 03/13/2015   SpO2 97%   BMI 36.26 kg/m    Physical Exam Constitutional:      General: She is not in acute distress.    Appearance: She is well-developed. She is  not diaphoretic.  HENT:     Right Ear: External ear normal.     Left Ear: External ear normal.     Nose: Nose normal.  Eyes:     Conjunctiva/sclera: Conjunctivae normal.  Neck:     Musculoskeletal: Neck supple.  Cardiovascular:     Rate and Rhythm: Normal rate.  Pulmonary:     Effort: Pulmonary effort is normal.  Skin:    General: Skin is warm and dry.     Capillary Refill: Capillary refill takes less than 2 seconds.  Neurological:     Mental Status: She is alert. Mental status is at baseline.  Psychiatric:        Mood and Affect: Mood normal.        Behavior: Behavior normal.           Assessment & Plan:   Problem List Items Addressed This Visit      Cardiovascular and Mediastinum   Essential hypertension, benign - Primary    Will do home monitoring. If high will increase losartan to 100. Lengthy conversation about how NSAID could be worsening HTN. See plan below        Respiratory   Extrinsic asthma, mild persistent, uncomplicated    Stable.  Refill nebulizer solution so that it does not expire.       Relevant Medications   albuterol (PROVENTIL) (2.5 MG/3ML) 0.083% nebulizer solution     Musculoskeletal and Integument   Osteoarthritis    Taking diclofenac BID and notes significant pain with decreased doses. Long discussion about risk of MI/Stroke as well as worsening BP and GERD. Suggested trial of different approach per patient instructions below. Refill provided to try to wean to prn use.       Relevant Medications   diclofenac (VOLTAREN) 75 MG EC tablet     Arthritis 1) Try tylenol 1000 mg two-3 times a day 2) If you notice that only some joints are bothers are you > then you could try just the topical diclofenac  3) Glucosamine and chondroitin   Return in about 6 months (around 05/15/2019).  Lynnda ChildJessica R Cody, MD

## 2018-12-07 ENCOUNTER — Other Ambulatory Visit: Payer: Self-pay | Admitting: Family Medicine

## 2019-01-01 ENCOUNTER — Other Ambulatory Visit: Payer: Self-pay

## 2019-01-01 MED ORDER — MONTELUKAST SODIUM 10 MG PO TABS: 10.0000 mg | ORAL_TABLET | Freq: Every day | ORAL | 2 refills | Status: DC

## 2019-01-18 ENCOUNTER — Other Ambulatory Visit: Payer: Self-pay | Admitting: Family Medicine

## 2019-01-20 ENCOUNTER — Other Ambulatory Visit: Payer: Self-pay | Admitting: Family Medicine

## 2019-02-07 ENCOUNTER — Other Ambulatory Visit: Payer: Self-pay | Admitting: Family Medicine

## 2019-02-11 ENCOUNTER — Other Ambulatory Visit: Payer: Self-pay | Admitting: Family Medicine

## 2019-02-11 DIAGNOSIS — F411 Generalized anxiety disorder: Secondary | ICD-10-CM

## 2019-02-11 DIAGNOSIS — F325 Major depressive disorder, single episode, in full remission: Secondary | ICD-10-CM

## 2019-02-11 NOTE — Telephone Encounter (Signed)
Last time this was refilled by previous PCP. Ok to refill? Per last office note in May patient will be due for follow up around November per Dr Cody's notes.

## 2019-02-11 NOTE — Telephone Encounter (Signed)
I will refill for a 90 day supply. Please notify and schedule patient for follow up in November 2020.

## 2019-02-12 NOTE — Telephone Encounter (Signed)
Patient advised and appointment made 

## 2019-02-21 ENCOUNTER — Other Ambulatory Visit: Payer: Self-pay | Admitting: Family Medicine

## 2019-03-04 ENCOUNTER — Other Ambulatory Visit: Payer: Self-pay | Admitting: Family Medicine

## 2019-03-05 ENCOUNTER — Telehealth: Payer: Self-pay | Admitting: Family Medicine

## 2019-03-05 MED ORDER — FLUTICASONE PROPIONATE 50 MCG/ACT NA SUSP: 1.0000 | Freq: Every day | NASAL | 2 refills | Status: DC

## 2019-03-05 MED ORDER — ESOMEPRAZOLE MAGNESIUM 40 MG PO CPDR: 40.0000 mg | DELAYED_RELEASE_CAPSULE | Freq: Two times a day (BID) | ORAL | 3 refills | Status: DC

## 2019-03-05 MED ORDER — BREO ELLIPTA 100-25 MCG/INH IN AEPB: INHALATION_SPRAY | RESPIRATORY_TRACT | 2 refills | Status: DC

## 2019-03-05 NOTE — Telephone Encounter (Signed)
Patient is requesting a refill  Breo Ellipta Flonase  She stated she spoke with the pharmacy and they stated we denied the refills.    [address]

## 2019-03-05 NOTE — Telephone Encounter (Signed)
Spoke with patient. Previous refill went to previous PCP and they denied it. Advised patient refills sent in now and to keep follow up appointment in November

## 2019-03-30 ENCOUNTER — Ambulatory Visit: Payer: Self-pay | Admitting: *Deleted

## 2019-03-30 ENCOUNTER — Ambulatory Visit: Payer: BLUE CROSS/BLUE SHIELD | Admitting: Family Medicine

## 2019-03-30 ENCOUNTER — Emergency Department: Payer: BC Managed Care – PPO

## 2019-03-30 ENCOUNTER — Emergency Department
Admission: EM | Admit: 2019-03-30 | Discharge: 2019-03-30 | Disposition: A | Discharge status: Home / Self Care | Payer: BC Managed Care – PPO | Attending: Student in an Organized Health Care Education/Training Program | Admitting: Student in an Organized Health Care Education/Training Program

## 2019-03-30 ENCOUNTER — Other Ambulatory Visit: Payer: Self-pay

## 2019-03-30 ENCOUNTER — Encounter: Payer: Self-pay | Admitting: Emergency Medicine

## 2019-03-30 DIAGNOSIS — J45909 Unspecified asthma, uncomplicated: Secondary | ICD-10-CM | POA: Insufficient documentation

## 2019-03-30 DIAGNOSIS — Z20828 Contact with and (suspected) exposure to other viral communicable diseases: Secondary | ICD-10-CM | POA: Insufficient documentation

## 2019-03-30 DIAGNOSIS — Z79899 Other long term (current) drug therapy: Secondary | ICD-10-CM | POA: Diagnosis not present

## 2019-03-30 DIAGNOSIS — I10 Essential (primary) hypertension: Secondary | ICD-10-CM | POA: Diagnosis not present

## 2019-03-30 DIAGNOSIS — R0602 Shortness of breath: Secondary | ICD-10-CM | POA: Diagnosis present

## 2019-03-30 DIAGNOSIS — J189 Pneumonia, unspecified organism: Secondary | ICD-10-CM

## 2019-03-30 DIAGNOSIS — Z87891 Personal history of nicotine dependence: Secondary | ICD-10-CM | POA: Insufficient documentation

## 2019-03-30 DIAGNOSIS — J181 Lobar pneumonia, unspecified organism: Secondary | ICD-10-CM | POA: Insufficient documentation

## 2019-03-30 LAB — COMPREHENSIVE METABOLIC PANEL
ALT: 15 U/L (ref 0–44)
AST: 17 U/L (ref 15–41)
Albumin: 3.8 g/dL (ref 3.5–5.0)
Alkaline Phosphatase: 85 U/L (ref 38–126)
Anion gap: 10 (ref 5–15)
BUN: 13 mg/dL (ref 6–20)
CO2: 25 mmol/L (ref 22–32)
Calcium: 9.4 mg/dL (ref 8.9–10.3)
Chloride: 105 mmol/L (ref 98–111)
Creatinine, Ser: 0.69 mg/dL (ref 0.44–1.00)
GFR calc Af Amer: >60 mL/min (ref >60)
GFR calc non Af Amer: >60 mL/min (ref >60)
Glucose, Bld: 90 mg/dL (ref 70–99)
Potassium: 3.7 mmol/L (ref 3.5–5.1)
Sodium: 140 mmol/L (ref 135–145)
Total Bilirubin: 0.7 mg/dL (ref 0.3–1.2)
Total Protein: 6.9 g/dL (ref 6.5–8.1)

## 2019-03-30 LAB — CBC WITH DIFFERENTIAL/PLATELET
Abs Immature Granulocytes: 0.04 10*3/uL (ref 0.00–0.07)
Basophils Absolute: 0 10*3/uL (ref 0.0–0.1)
Basophils Relative: 0 %
Eosinophils Absolute: 0.3 10*3/uL (ref 0.0–0.5)
Eosinophils Relative: 4 %
HCT: 32.8 % — ABNORMAL LOW (ref 36.0–46.0)
Hemoglobin: 10.4 g/dL — ABNORMAL LOW (ref 12.0–15.0)
Immature Granulocytes: 1 %
Lymphocytes Relative: 16 %
Lymphs Abs: 1.2 10*3/uL (ref 0.7–4.0)
MCH: 25 pg — ABNORMAL LOW (ref 26.0–34.0)
MCHC: 31.7 g/dL (ref 30.0–36.0)
MCV: 78.8 fL — ABNORMAL LOW (ref 80.0–100.0)
Monocytes Absolute: 0.4 10*3/uL (ref 0.1–1.0)
Monocytes Relative: 6 %
Neutro Abs: 5.5 10*3/uL (ref 1.7–7.7)
Neutrophils Relative %: 73 %
Platelets: 186 10*3/uL (ref 150–400)
RBC: 4.16 MIL/uL (ref 3.87–5.11)
RDW: 15.9 % — ABNORMAL HIGH (ref 11.5–15.5)
WBC: 7.4 10*3/uL (ref 4.0–10.5)
nRBC: 0 % (ref 0.0–0.2)

## 2019-03-30 LAB — SARS CORONAVIRUS 2 BY RT PCR (HOSPITAL ORDER, PERFORMED IN ~~LOC~~ HOSPITAL LAB): SARS Coronavirus 2: NEGATIVE

## 2019-03-30 MED ORDER — PREDNISONE 20 MG PO TABS
50.0000 mg | ORAL_TABLET | Freq: Once | ORAL | Status: AC
  Administered 2019-03-30: 50 mg via ORAL
  Filled 2019-03-30: qty 2

## 2019-03-30 MED ORDER — IOHEXOL 350 MG/ML SOLN
75.0000 mL | Freq: Once | INTRAVENOUS | Status: AC | PRN
  Administered 2019-03-30: 15:00:00 75 mL via INTRAVENOUS
  Filled 2019-03-30: qty 75

## 2019-03-30 MED ORDER — LEVOFLOXACIN 750 MG PO TABS: 750.0000 mg | ORAL_TABLET | Freq: Every day | ORAL | 0 refills | Status: AC

## 2019-03-30 MED ORDER — PREDNISONE 20 MG PO TABS: 40.0000 mg | ORAL_TABLET | Freq: Every day | ORAL | 0 refills | Status: AC

## 2019-03-30 MED ORDER — LEVOFLOXACIN 750 MG PO TABS
750.0000 mg | ORAL_TABLET | Freq: Once | ORAL | Status: AC
  Administered 2019-03-30: 750 mg via ORAL
  Filled 2019-03-30: qty 1

## 2019-03-30 NOTE — ED Notes (Signed)
PT with ambulating O2 sats maintained 95-100% without significant change.

## 2019-03-30 NOTE — Telephone Encounter (Signed)
Definitely needs clinical exam and COVID testing We will await the disposition after the urgent care visit

## 2019-03-30 NOTE — Telephone Encounter (Signed)
Pt was referred to me to triage from [facility]. She is c/o being very tired and tight breathing.   She has asthma.  This feels different than her asthma.  See triage notes.  I have transferred her call into the office for scheduling.  Reason for Disposition . [1] MILD difficulty breathing (e.g., minimal/no SOB at rest, SOB with walking, pulse <100) AND [2] NEW-onset or WORSE than normal    Don't feel good and very tired.  Answer Assessment - Initial Assessment Questions 1. RESPIRATORY STATUS: "Describe your breathing?" (e.g., wheezing, shortness of breath, unable to speak, severe coughing)      Breathing is tight.   I have asthma.   I've used my inhaler twice this morning.   I used it once yesterday.   If try to take a deep breath I cough.   I'm coughing up green sputum.    Started on Saturday. 2. ONSET: "When did this breathing problem begin?"      A little Sunday but worse on Monday and today.    I just got a flu shot last Thursday.   Is this from that?   This all started Sunday. 3. PATTERN "Does the difficult breathing come and go, or has it been constant since it started?"      It's mostly in the morning.   I feel tired and weak. I just don't feel myself. 4. SEVERITY: "How bad is your breathing?" (e.g., mild, moderate, severe)    - MILD: No SOB at rest, mild SOB with walking, speaks normally in sentences, can lay down, no retractions, pulse < 100.    - MODERATE: SOB at rest, SOB with minimal exertion and prefers to sit, cannot lie down flat, speaks in phrases, mild retractions, audible wheezing, pulse 100-120.    - SEVERE: Very SOB at rest, speaks in single words, struggling to breathe, sitting hunched forward, retractions, pulse > 120      Mild.    I'm very tired.   Like doing laundry just wears me out. 5. RECURRENT SYMPTOM: "Have you had difficulty breathing before?" If so, ask: "When was the last time?" and "What happened that time?"      Sunday at 3:00 AM I woke up I'm  freezing and my bones hurt.   I get in a hot bath to get warm.   That will last a couple of hours then after the episode is over I'm very tired.   It always happens between 3:00-4:00 in the morning.   Last time this happened was over a year ago.   No one has been able to figure this out.   This past Sunday I vomited.  I've never done that before.   Sunday I was very tired and got a little diarrhea.    Monday I felt real tired. 6. CARDIAC HISTORY: "Do you have any history of heart disease?" (e.g., heart attack, angina, bypass surgery, angioplasty)      No 7. LUNG HISTORY: "Do you have any history of lung disease?"  (e.g., pulmonary embolus, asthma, emphysema)     Asthma.   8. CAUSE: "What do you think is causing the breathing problem?"      I don't feel like it's asthma.    I just feel bad. 9. OTHER SYMPTOMS: "Do you have any other symptoms? (e.g., dizziness, runny nose, cough, chest pain, fever)     No energy.   No exposures to COVID-19 that I know of.   I always wear  my mask when I'm out.    10. PREGNANCY: "Is there any chance you are pregnant?" "When was your last menstrual period?"       Not asked due to age. 11. TRAVEL: "Have you traveled out of the country in the last month?" (e.g., travel history, exposures)       No travels.  Protocols used: BREATHING DIFFICULTY-A-AH

## 2019-03-30 NOTE — Telephone Encounter (Signed)
Pt talking with Raquel Sarna at front desk and pt advised she has diarrhea and vomiting on 03/28/19. Pt had shivering and chills on 03/28/19. Today pt is extremely tired and gets mildly winded upon exertion. Pt said it does not feel like her asthma. Pt is not in distress now but is sitting. Pt will go to UC in [place] and will have UC fax their findings; pt will ask UC about covid testing; pt wears mask when she goes out in public but someone pts family knows is a Pharmacist, hospital and is concerned she may not have symptoms but carry covid and pts husband said co worker was sent home on 03/29/19. Pt will go to UC now. Dr Einar Pheasant out of office and will send FYI to Dr Silvio Pate.

## 2019-03-30 NOTE — ED Triage Notes (Signed)
Patient presents to ED via POV from home with c/o shortness of breath since Sunday. Patient also reports 1 episode of vomiting and 2 episodes of diarrhea since then. Able to speak full, complete sentences. Patient also endorses chills.

## 2019-03-30 NOTE — Telephone Encounter (Signed)
ER Notes says she was diagnosed with Pneumonia and Covid test was negative. Given Levaquin and Prednisone and discharged.

## 2019-03-30 NOTE — ED Provider Notes (Signed)
[facility] Emergency Department Provider Note    First MD Initiated Contact with Patient 03/30/19 1348     (approximate)  I have reviewed the triage vital signs and the nursing notes.   HISTORY  Chief Complaint Shortness of Breath    HPI Shannon Mckenzie is a 58 y.o. female the below listed past medical history presents to the ER for evaluation of generalized malaise shortness of breath productive cough and some chills over the past 3 to 4 days.  Does have a history of asthma.  Has had 2 episodes of nausea with vomiting.  Also had a few episodes of watery diarrhea over the weekend.  No known sick contacts.  Denies any abdominal pain or flank pain.    Past Medical History:  Diagnosis Date  . Allergy   . Anemia   . Anxiety   . Asthma   . Depression   . DJD (degenerative joint disease) of hip   . Former smoker   . GERD (gastroesophageal reflux disease)   . Hand pain, right   . Hiatal hernia   . History of kidney stones    history  . Hyperlipidemia   . Hypertension   . Kidney stone   . Osteoarthritis   . Pain, hip    right  . Pneumonia    hx but no hospitalization  . Reactive airway disease   . RLS (restless legs syndrome)   . Routine gynecological examination    sees gynecology, [name] NP  . SOB (shortness of breath) 9/14   cardiac eval with stress test and echo; Dr. [name]  . Statin intolerance   . Vision problems    wears contact lenses   Family History  Problem Relation Age of Onset  . Arthritis Mother   . Asthma Mother   . Diabetes Mother   . Cirrhosis Mother        died of liver nonalcoholic cirrhosis  . COPD Mother   . Heart disease Mother        CHF  . Hypertension Mother   . Kidney disease Mother   . Liver disease Mother   . Eating disorder Mother   . Obesity Mother   . Asthma Sister        doesnt know history well  . Drug abuse Sister   . Cancer Father        died of pancreatic cancer at age 56  . Alcohol  abuse Father   . Depression Brother   . Alcohol abuse Brother   . Stroke Maternal Aunt        died age 71yo of CVA  . Other Brother        died of MVA  . Other Sister   . Drug abuse Sister        doesnt know history well  . Heart attack Maternal Grandfather 78   Past Surgical History:  Procedure Laterality Date  . bilateral hip replacement     nov 21st 2019 right, Jun 09 2018 left   . CARPAL TUNNEL RELEASE Left 07/03/2017   Procedure: LEFT CARPAL TUNNEL RELEASE;  Surgeon: Betha Loa, MD;  Location: [facility];  Service: Orthopedics;  Laterality: Left;  . COLONOSCOPY  06/2012   Dr. Ramon Dredge; repeat 10 years  . UPPER GI ENDOSCOPY N/A 07/24/2012   Hiatus Hernia,normal stomach, normal examined duodenum  . WISDOM TOOTH EXTRACTION     Patient Active Problem List   Diagnosis Date  Noted  . Murmur 07/16/2018  . GERD (gastroesophageal reflux disease) 01/10/2018  . Allergic rhinitis 05/26/2017  . Carpal tunnel syndrome 05/26/2017  . Former smoker 05/26/2017  . Major depression in full remission (HCC) 05/08/2017  . GAD (generalized anxiety disorder) 05/08/2017  . Iron deficiency 02/20/2016  . Osteoarthritis 03/13/2015  . Cough 03/13/2015  . Back pain 11/10/2014  . Rash and nonspecific skin eruption 07/21/2014  . Cold feeling 05/03/2014  . Sinusitis, chronic 06/14/2013  . Anemia 04/16/2013  . Extrinsic asthma, mild persistent, uncomplicated 03/20/2013  . Upper airway cough syndrome ? all related to sinus dz 03/19/2013  . Essential hypertension, benign 04/15/2012  . Obesity 04/15/2012  . Hyperlipidemia 04/15/2012      Prior to Admission medications   Medication Sig Start Date End Date Taking? Authorizing Provider  acetaminophen (TYLENOL) 325 MG tablet Take 650 mg by mouth every 8 (eight) hours as needed.    [provider]  albuterol (PROVENTIL HFA;VENTOLIN HFA) 108 (90 Base) MCG/ACT inhaler Inhale 2 puffs into the lungs every 6 (six) hours as needed for  wheezing or shortness of breath. 04/29/18   [name], PA  albuterol (PROVENTIL) (2.5 MG/3ML) 0.083% nebulizer solution Take 3 mLs (2.5 mg total) by nebulization every 6 (six) hours as needed for wheezing or shortness of breath. 11/12/18   [name], MD  cholecalciferol (VITAMIN D3) 25 MCG (1000 UT) tablet Take 1,000 Units by mouth daily.    [provider]  Cyanocobalamin (B-12) 1000 MCG CAPS Take by mouth.    [provider]  diclofenac (VOLTAREN) 75 MG EC tablet Take 1 tablet (75 mg total) by mouth 2 (two) times daily. 11/12/18   [name], MD  escitalopram (LEXAPRO) 20 MG tablet TAKE 1 TABLET(20 MG) BY MOUTH DAILY 02/11/19   Doreene Nest, NP  esomeprazole (NEXIUM) 40 MG capsule Take 1 capsule (40 mg total) by mouth 2 (two) times daily. 03/05/19   [name], MD  ezetimibe (ZETIA) 10 MG tablet TAKE 1 TABLET(10 MG) BY MOUTH DAILY 01/18/19   [name], MD  fluticasone [facility]) 50 MCG/ACT nasal spray Place 1 spray into both nostrils daily. 03/05/19   [name], MD  fluticasone furoate-vilanterol (BREO ELLIPTA) 100-25 MCG/INH AEPB INHALE 1 PUFF INTO THE LUNGS DAILY 03/05/19   [name], MD  levofloxacin (LEVAQUIN) 750 MG tablet Take 1 tablet (750 mg total) by mouth daily for 6 days. 03/30/19 04/05/19  Willy Eddy, MD  losartan (COZAAR) 50 MG tablet TAKE 1 TABLET(50 MG) BY MOUTH DAILY 11/05/18   [name], MD  montelukast (SINGULAIR) 10 MG tablet Take 1 tablet (10 mg total) by mouth at bedtime. 01/01/19   [name], MD  predniSONE (DELTASONE) 20 MG tablet Take 2 tablets (40 mg total) by mouth daily for 5 days. 03/30/19 04/04/19  Willy Eddy, MD    Allergies Statins and Amoxicillin    Social History Social History   Tobacco Use  . Smoking status: Former Smoker    Packs/day: 1.00    Years: 15.00    Pack years: 15.00    Types: Cigarettes    Quit date: 06/25/1991    Years since quitting: 27.7  . Smokeless  tobacco: Never Used  Substance Use Topics  . Alcohol use: Yes    Alcohol/week: 0.0 standard drinks    Frequency: Never    Comment: wine every 6 months or so  . Drug use: No    Review of Systems  Patient denies headaches, rhinorrhea, blurry vision, numbness, shortness of breath, chest pain, edema, cough, abdominal pain, nausea, vomiting, diarrhea, dysuria, fevers, rashes or hallucinations unless otherwise stated above in HPI. ____________________________________________   PHYSICAL EXAM:  VITAL SIGNS: Vitals:   03/30/19 1250  BP: (!) 150/77  Pulse: 86  Resp: 17  Temp: 98.8 F (37.1 C)  SpO2: 97%    Constitutional: Alert and oriented.  Eyes: Conjunctivae are normal.  Head: Atraumatic. Nose: No congestion/rhinnorhea. Mouth/Throat: Mucous membranes are moist.   Neck: No stridor. Painless ROM.  Cardiovascular: Normal rate, regular rhythm. Grossly normal heart sounds.  Good peripheral circulation. Respiratory: Normal respiratory effort.  No retractions. Lungs with right-sided rhonchi,  Scattered wheeze Gastrointestinal: Soft and nontender. No distention. No abdominal bruits. No CVA tenderness. Genitourinary:  Musculoskeletal: No lower extremity tenderness nor edema.  No joint effusions. Neurologic:  Normal speech and language. No gross focal neurologic deficits are appreciated. No facial droop Skin:  Skin is warm, dry and intact. No rash noted. Psychiatric: Mood and affect are normal. Speech and behavior are normal.  ____________________________________________   LABS (all labs ordered are listed, but only abnormal results are displayed)  Results for orders placed or performed during the hospital encounter of 03/30/19 (from the past 24 hour(s))  CBC with Differential/Platelet     Status: Abnormal   Collection Time: 03/30/19  2:04 PM  Result Value Ref Range   WBC 7.4 4.0 - 10.5 K/uL   RBC 4.16 3.87 - 5.11 MIL/uL   Hemoglobin 10.4 (L) 12.0 - 15.0 g/dL   HCT 32.8 (L) 36.0 -  46.0 %   MCV 78.8 (L) 80.0 - 100.0 fL   MCH 25.0 (L) 26.0 - 34.0 pg   MCHC 31.7 30.0 - 36.0 g/dL   RDW 15.9 (H) 11.5 - 15.5 %   Platelets 186 150 - 400 K/uL   nRBC 0.0 0.0 - 0.2 %   Neutrophils Relative % 73 %   Neutro Abs 5.5 1.7 - 7.7 K/uL   Lymphocytes Relative 16 %   Lymphs Abs 1.2 0.7 - 4.0 K/uL   Monocytes Relative 6 %   Monocytes Absolute 0.4 0.1 - 1.0 K/uL   Eosinophils Relative 4 %   Eosinophils Absolute 0.3 0.0 - 0.5 K/uL   Basophils Relative 0 %   Basophils Absolute 0.0 0.0 - 0.1 K/uL   Immature Granulocytes 1 %   Abs Immature Granulocytes 0.04 0.00 - 0.07 K/uL  Comprehensive metabolic panel     Status: None   Collection Time: 03/30/19  2:04 PM  Result Value Ref Range   Sodium 140 135 - 145 mmol/L   Potassium 3.7 3.5 - 5.1 mmol/L   Chloride 105 98 - 111 mmol/L   CO2 25 22 - 32 mmol/L   Glucose, Bld 90 70 - 99 mg/dL   BUN 13 6 - 20 mg/dL   Creatinine, Ser 0.69 0.44 - 1.00 mg/dL   Calcium 9.4 8.9 - 10.3 mg/dL   Total Protein 6.9 6.5 - 8.1 g/dL   Albumin 3.8 3.5 - 5.0 g/dL   AST 17 15 - 41 U/L   ALT 15 0 - 44 U/L   Alkaline Phosphatase 85 38 - 126 U/L   Total Bilirubin 0.7 0.3 - 1.2 mg/dL   GFR calc non Af Amer >60 >60 mL/min   GFR calc Af Amer >60 >60 mL/min   Anion gap 10 5 - 15  SARS Coronavirus 2 [facility] order, Performed in [facility] hospital lab) Nasopharyngeal Nasopharyngeal Swab  Status: None   Collection Time: 03/30/19  2:04 PM   Specimen: Nasopharyngeal Swab  Result Value Ref Range   SARS Coronavirus 2 NEGATIVE NEGATIVE   ____________________________________________  EKG My review and personal interpretation at Time: 13:00   Indication: n/v  Rate: 80  Rhythm: sinus Axis: normal Other: normal intervals, no stemi ____________________________________________  RADIOLOGY  I personally reviewed all radiographic images ordered to evaluate for the above acute complaints and reviewed radiology reports and findings.  These findings were personally  discussed with the patient.  Please see medical record for radiology report.  ____________________________________________   PROCEDURES  Procedure(s) performed:  Procedures    Critical Care performed: no ____________________________________________   INITIAL IMPRESSION / ASSESSMENT AND PLAN / ED COURSE  Pertinent labs & imaging results that were available during my care of the patient were reviewed by me and considered in my medical decision making (see chart for details).   DDX: pna, covid, copd, asthma, pe, mass, electrolyte abn  Shannon Mckenzie is a 58 y.o. who presents to the ED with symptoms as described above.  Patient afebrile hemodynamically stable.  Letter will be sent for by differential.  Will order imaging.  Chest x-ray does show evidence of complex or heterogenous infiltrate in the right middle lobe and patient is afebrile with no white count and down trending red cells concerning for malignancy.  Will order CT to further evaluate.  Clinical Course as of Mar 29 1629  Tue Mar 30, 2019  1623 Patient reassessed.  Remains afebrile without any respiratory distress hypoxia or tachycardia.  No evidence of PE.  Symptoms are more consistent with community-acquired pneumonia.  Will be placed on Levaquin as she does have a beta-lactam allergy.  She does have nebulizers at home.  I do not appreciate any significant wheezing.  She does have some exertional dyspnea but none that would be outside of what is expected for her current clinical state.  She is tolerating oral hydration and hemodynamically stable and appropriate for a trial of outpatient management.  Discussed signs and symptoms for which she should return to the ER and follow-up with PCP as well.   [PR]    Clinical Course User Index [PR] Willy Eddy, MD    The patient was evaluated in Emergency Department today for the symptoms described in the history of present illness. He/she was evaluated in the context of the  global COVID-19 pandemic, which necessitated consideration that the patient might be at risk for infection with the SARS-CoV-2 virus that causes COVID-19. Institutional protocols and algorithms that pertain to the evaluation of patients at risk for COVID-19 are in a state of rapid change based on information released by regulatory bodies including the CDC and federal and state organizations. These policies and algorithms were followed during the patient's care in the ED.  As part of my medical de4ision making, I reviewed the following data within the electronic MEDICAL RECORD NUMBER Nursing notes reviewed and incorporated, Labs reviewed, notes from prior ED visits and [place] Controlled Substance Database   ____________________________________________   FINAL CLINICAL IMPRESSION(S) / ED DIAGNOSES  Final diagnoses:  SOB (shortness of breath)  Community acquired pneumonia of right middle lobe of lung      NEW MEDICATIONS STARTED DURING THIS VISIT:  New Prescriptions   LEVOFLOXACIN (LEVAQUIN) 750 MG TABLET    Take 1 tablet (750 mg total) by mouth daily for 6 days.   PREDNISONE (DELTASONE) 20 MG TABLET    Take 2 tablets (40  mg total) by mouth daily for 5 days.     Note:  This document was prepared using Dragon voice recognition software and may include unintentional dictation errors.    Willy Eddyobinson, Kadir Azucena, MD 03/30/19 1630

## 2019-03-31 NOTE — Telephone Encounter (Signed)
Please set her up with someone for follow up in the next week or so

## 2019-04-04 ENCOUNTER — Emergency Department: Payer: BC Managed Care – PPO

## 2019-04-04 ENCOUNTER — Other Ambulatory Visit: Payer: Self-pay

## 2019-04-04 ENCOUNTER — Emergency Department
Admission: EM | Admit: 2019-04-04 | Discharge: 2019-04-04 | Disposition: A | Discharge status: Home / Self Care | Payer: BC Managed Care – PPO | Attending: Emergency Medicine | Admitting: Emergency Medicine

## 2019-04-04 ENCOUNTER — Encounter: Payer: Self-pay | Admitting: Emergency Medicine

## 2019-04-04 DIAGNOSIS — Z96641 Presence of right artificial hip joint: Secondary | ICD-10-CM | POA: Insufficient documentation

## 2019-04-04 DIAGNOSIS — J189 Pneumonia, unspecified organism: Secondary | ICD-10-CM | POA: Diagnosis not present

## 2019-04-04 DIAGNOSIS — I10 Essential (primary) hypertension: Secondary | ICD-10-CM | POA: Insufficient documentation

## 2019-04-04 DIAGNOSIS — Z87891 Personal history of nicotine dependence: Secondary | ICD-10-CM | POA: Diagnosis not present

## 2019-04-04 DIAGNOSIS — K2100 Gastro-esophageal reflux disease with esophagitis, without bleeding: Secondary | ICD-10-CM

## 2019-04-04 DIAGNOSIS — Z96642 Presence of left artificial hip joint: Secondary | ICD-10-CM | POA: Insufficient documentation

## 2019-04-04 DIAGNOSIS — R071 Chest pain on breathing: Secondary | ICD-10-CM | POA: Diagnosis not present

## 2019-04-04 DIAGNOSIS — Z79899 Other long term (current) drug therapy: Secondary | ICD-10-CM | POA: Diagnosis not present

## 2019-04-04 DIAGNOSIS — R0789 Other chest pain: Secondary | ICD-10-CM | POA: Diagnosis present

## 2019-04-04 DIAGNOSIS — J45909 Unspecified asthma, uncomplicated: Secondary | ICD-10-CM | POA: Insufficient documentation

## 2019-04-04 LAB — BASIC METABOLIC PANEL
Anion gap: 8 (ref 5–15)
BUN: 18 mg/dL (ref 6–20)
CO2: 27 mmol/L (ref 22–32)
Calcium: 9.1 mg/dL (ref 8.9–10.3)
Chloride: 106 mmol/L (ref 98–111)
Creatinine, Ser: 0.94 mg/dL (ref 0.44–1.00)
GFR calc Af Amer: >60 mL/min (ref >60)
GFR calc non Af Amer: >60 mL/min (ref >60)
Glucose, Bld: 94 mg/dL (ref 70–99)
Potassium: 3.1 mmol/L — ABNORMAL LOW (ref 3.5–5.1)
Sodium: 141 mmol/L (ref 135–145)

## 2019-04-04 LAB — CBC
HCT: 33.9 % — ABNORMAL LOW (ref 36.0–46.0)
Hemoglobin: 10.6 g/dL — ABNORMAL LOW (ref 12.0–15.0)
MCH: 24.4 pg — ABNORMAL LOW (ref 26.0–34.0)
MCHC: 31.3 g/dL (ref 30.0–36.0)
MCV: 78.1 fL — ABNORMAL LOW (ref 80.0–100.0)
Platelets: 254 10*3/uL (ref 150–400)
RBC: 4.34 MIL/uL (ref 3.87–5.11)
RDW: 15.9 % — ABNORMAL HIGH (ref 11.5–15.5)
WBC: 6.2 10*3/uL (ref 4.0–10.5)
nRBC: 0 % (ref 0.0–0.2)

## 2019-04-04 LAB — TROPONIN I (HIGH SENSITIVITY): Troponin I (High Sensitivity): 4 ng/L (ref <18)

## 2019-04-04 MED ORDER — LIDOCAINE VISCOUS HCL 2 % MT SOLN
15.0000 mL | Freq: Once | OROMUCOSAL | Status: AC
  Administered 2019-04-04: 15 mL via ORAL
  Filled 2019-04-04: qty 15

## 2019-04-04 MED ORDER — LIDOCAINE VISCOUS HCL 2 % MT SOLN: 15.0000 mL | OROMUCOSAL | 0 refills | Status: DC | PRN

## 2019-04-04 MED ORDER — ALUM & MAG HYDROXIDE-SIMETH 200-200-20 MG/5ML PO SUSP
15.0000 mL | Freq: Once | ORAL | Status: AC
  Administered 2019-04-04: 15 mL via ORAL
  Filled 2019-04-04: qty 30

## 2019-04-04 MED ORDER — LEVOFLOXACIN 750 MG PO TABS
750.0000 mg | ORAL_TABLET | Freq: Once | ORAL | Status: AC
  Administered 2019-04-04: 17:00:00 750 mg via ORAL
  Filled 2019-04-04: qty 1

## 2019-04-04 MED ORDER — POTASSIUM CHLORIDE 20 MEQ/15ML (10%) PO SOLN
40.0000 meq | Freq: Once | ORAL | Status: AC
  Administered 2019-04-04: 17:00:00 40 meq via ORAL
  Filled 2019-04-04 (×2): qty 30

## 2019-04-04 NOTE — ED Notes (Signed)
Pt states she was diagnosed with pneumonia on 10/6. Pt states she now has CP on right side and states the pain radiates down her breast when she eats and with deep inhalation. Pt denies SOB, fever, swelling in her legs. Pt states she has been unable to eat for the past few days. Pt states she didn't take her medicine today.

## 2019-04-04 NOTE — ED Provider Notes (Signed)
[facility] Emergency Department Provider Note   ____________________________________________   First MD Initiated Contact with Patient 04/04/19 1653     (approximate)  I have reviewed the triage vital signs and the nursing notes.   HISTORY  Chief Complaint Chest Pain    HPI Shannon Mckenzie is a 58 y.o. female with past medical history of hypertension and asthma who presents to the ED complaining of chest pain.  Patient reports she has been dealing with pain in her chest for about the past week.  Initially started on the left side of her chest but has since migrated to the right.  She describes it as a burning that is worse when she takes a deep breath or when she eats.  She was initially seen in the ED for this 5 days ago, when she was diagnosed with pneumonia and started on Levaquin.  She was also started on steroids at that time and states that her breathing is much improved.  She denies any shortness of breath, has not had any fevers, pain or swelling in her legs.  She returns today because the pain has not improved and she wanted to make sure that it was not her gallbladder or something other than pneumonia.        Past Medical History:  Diagnosis Date  . Allergy   . Anemia   . Anxiety   . Asthma   . Depression   . DJD (degenerative joint disease) of hip   . Former smoker   . GERD (gastroesophageal reflux disease)   . Hand pain, right   . Hiatal hernia   . History of kidney stones    history  . Hyperlipidemia   . Hypertension   . Kidney stone   . Osteoarthritis   . Pain, hip    right  . Pneumonia    hx but no hospitalization  . Reactive airway disease   . RLS (restless legs syndrome)   . Routine gynecological examination    sees gynecology, [name] NP  . SOB (shortness of breath) 9/14   cardiac eval with stress test and echo; Dr. [name]  . Statin intolerance   . Vision problems    wears contact lenses    Patient Active  Problem List   Diagnosis Date Noted  . Murmur 07/16/2018  . GERD (gastroesophageal reflux disease) 01/10/2018  . Allergic rhinitis 05/26/2017  . Carpal tunnel syndrome 05/26/2017  . Former smoker 05/26/2017  . Major depression in full remission (HCC) 05/08/2017  . GAD (generalized anxiety disorder) 05/08/2017  . Iron deficiency 02/20/2016  . Osteoarthritis 03/13/2015  . Cough 03/13/2015  . Back pain 11/10/2014  . Rash and nonspecific skin eruption 07/21/2014  . Cold feeling 05/03/2014  . Sinusitis, chronic 06/14/2013  . Anemia 04/16/2013  . Extrinsic asthma, mild persistent, uncomplicated 03/20/2013  . Upper airway cough syndrome ? all related to sinus dz 03/19/2013  . Essential hypertension, benign 04/15/2012  . Obesity 04/15/2012  . Hyperlipidemia 04/15/2012    Past Surgical History:  Procedure Laterality Date  . bilateral hip replacement     nov 21st 2019 right, Jun 09 2018 left   . CARPAL TUNNEL RELEASE Left 07/03/2017   Procedure: LEFT CARPAL TUNNEL RELEASE;  Surgeon: Betha Loa, MD;  Location: [facility];  Service: Orthopedics;  Laterality: Left;  . COLONOSCOPY  06/2012   Dr. Ramon Dredge; repeat 10 years  . UPPER GI ENDOSCOPY N/A 07/24/2012   Hiatus Hernia,normal stomach,  normal examined duodenum  . WISDOM TOOTH EXTRACTION      Prior to Admission medications   Medication Sig Start Date End Date Taking? Authorizing Provider  acetaminophen (TYLENOL) 325 MG tablet Take 650 mg by mouth every 8 (eight) hours as needed.    [provider]  albuterol (PROVENTIL HFA;VENTOLIN HFA) 108 (90 Base) MCG/ACT inhaler Inhale 2 puffs into the lungs every 6 (six) hours as needed for wheezing or shortness of breath. 04/29/18   Inda Coke, PA  albuterol (PROVENTIL) (2.5 MG/3ML) 0.083% nebulizer solution Take 3 mLs (2.5 mg total) by nebulization every 6 (six) hours as needed for wheezing or shortness of breath. 11/12/18   Lesleigh Noe, MD  cholecalciferol (VITAMIN  D3) 25 MCG (1000 UT) tablet Take 1,000 Units by mouth daily.    [provider]  Cyanocobalamin (B-12) 1000 MCG CAPS Take by mouth.    [provider]  diclofenac (VOLTAREN) 75 MG EC tablet Take 1 tablet (75 mg total) by mouth 2 (two) times daily. 11/12/18   Lesleigh Noe, MD  escitalopram (LEXAPRO) 20 MG tablet TAKE 1 TABLET(20 MG) BY MOUTH DAILY 02/11/19   Pleas Koch, NP  esomeprazole (NEXIUM) 40 MG capsule Take 1 capsule (40 mg total) by mouth 2 (two) times daily. 03/05/19   Lesleigh Noe, MD  ezetimibe (ZETIA) 10 MG tablet TAKE 1 TABLET(10 MG) BY MOUTH DAILY 01/18/19   Lesleigh Noe, MD  fluticasone [facility]) 50 MCG/ACT nasal spray Place 1 spray into both nostrils daily. 03/05/19   Lesleigh Noe, MD  fluticasone furoate-vilanterol (BREO ELLIPTA) 100-25 MCG/INH AEPB INHALE 1 PUFF INTO THE LUNGS DAILY 03/05/19   Lesleigh Noe, MD  levofloxacin (LEVAQUIN) 750 MG tablet Take 1 tablet (750 mg total) by mouth daily for 6 days. 03/30/19 04/05/19  Merlyn Lot, MD  lidocaine (XYLOCAINE) 2 % solution Use as directed 15 mLs in the mouth or throat as needed for mouth pain. 04/04/19   Blake Divine, MD  losartan (COZAAR) 50 MG tablet TAKE 1 TABLET(50 MG) BY MOUTH DAILY 11/05/18   Marin Olp, MD  montelukast (SINGULAIR) 10 MG tablet Take 1 tablet (10 mg total) by mouth at bedtime. 01/01/19   Lesleigh Noe, MD    Allergies Statins and Amoxicillin  Family History  Problem Relation Age of Onset  . Arthritis Mother   . Asthma Mother   . Diabetes Mother   . Cirrhosis Mother        died of liver nonalcoholic cirrhosis  . COPD Mother   . Heart disease Mother        CHF  . Hypertension Mother   . Kidney disease Mother   . Liver disease Mother   . Eating disorder Mother   . Obesity Mother   . Asthma Sister        doesnt know history well  . Drug abuse Sister   . Cancer Father        died of pancreatic cancer at age 39  . Alcohol abuse Father   .  Depression Brother   . Alcohol abuse Brother   . Stroke Maternal Aunt        died age 36yo of CVA  . Other Brother        died of MVA  . Other Sister   . Drug abuse Sister        doesnt know history well  . Heart attack Maternal Grandfather 81    Social History Social  History   Tobacco Use  . Smoking status: Former Smoker    Packs/day: 1.00    Years: 15.00    Pack years: 15.00    Types: Cigarettes    Quit date: 06/25/1991    Years since quitting: 27.7  . Smokeless tobacco: Never Used  Substance Use Topics  . Alcohol use: Yes    Alcohol/week: 0.0 standard drinks    Frequency: Never    Comment: wine every 6 months or so  . Drug use: No    Review of Systems  Constitutional: No fever/chills Eyes: No visual changes. ENT: No sore throat. Cardiovascular: Positive for chest pain. Respiratory: Denies shortness of breath. Gastrointestinal: No abdominal pain.  No nausea, no vomiting.  No diarrhea.  No constipation. Genitourinary: Negative for dysuria. Musculoskeletal: Negative for back pain. Skin: Negative for rash. Neurological: Negative for headaches, focal weakness or numbness.  ____________________________________________   PHYSICAL EXAM:  VITAL SIGNS: ED Triage Vitals  Enc Vitals Group     BP 04/04/19 1510 (!) 136/91     Pulse Rate 04/04/19 1510 74     Resp 04/04/19 1510 20     Temp 04/04/19 1510 98 F (36.7 C)     Temp Source 04/04/19 1510 Oral     SpO2 04/04/19 1510 99 %     Weight 04/04/19 1510 195 lb (88.5 kg)     Height 04/04/19 1510  (1.575 m)     Head Circumference --      Peak Flow --      Pain Score 04/04/19 1516 8     Pain Loc --      Pain Edu? --      Excl. in GC? --     Constitutional: Alert and oriented. Eyes: Conjunctivae are normal. Head: Atraumatic. Nose: No congestion/rhinnorhea. Mouth/Throat: Mucous membranes are moist. Neck: Normal ROM Cardiovascular: Normal rate, regular rhythm. Grossly normal heart sounds. Respiratory:  Normal respiratory effort.  No retractions. Lungs CTAB. Gastrointestinal: Soft and nontender. No distention. Genitourinary: deferred Musculoskeletal: No lower extremity tenderness nor edema. Neurologic:  Normal speech and language. No gross focal neurologic deficits are appreciated. Skin:  Skin is warm, dry and intact. No rash noted. Psychiatric: Mood and affect are normal. Speech and behavior are normal.  ____________________________________________   LABS (all labs ordered are listed, but only abnormal results are displayed)  Labs Reviewed  BASIC METABOLIC PANEL - Abnormal; Notable for the following components:      Result Value   Potassium 3.1 (*)    All other components within normal limits  CBC - Abnormal; Notable for the following components:   Hemoglobin 10.6 (*)    HCT 33.9 (*)    MCV 78.1 (*)    MCH 24.4 (*)    RDW 15.9 (*)    All other components within normal limits  TROPONIN I (HIGH SENSITIVITY)   ____________________________________________  EKG  ED ECG REPORT I, Chesley Noon, the attending physician, personally viewed and interpreted this ECG.   Date: 04/04/2019  EKG Time: 15:17  Rate: 80  Rhythm: normal sinus rhythm  Axis: Normal  Intervals:none  ST&T Change: Nonspecific T wave abnormality   PROCEDURES  Procedure(s) performed (including Critical Care):  Procedures   ____________________________________________   INITIAL IMPRESSION / ASSESSMENT AND PLAN / ED COURSE       58 year old female with history of hypertension and asthma presents to the ED with ongoing pleuritic chest pain that has now moved to the right side of her chest after she  was diagnosed with pneumonia 5 days prior.  She does state that overall her breathing is much improved since she was started on steroids and antibiotics.  Do not suspect there is some alternative etiology to her symptoms other than pneumonia.  Doubt biliary or other GI pathology.  No evidence of PE.  Chest  x-ray shows some improvement on the left as well as development of opacities on right consistent with no pneumonia.  We will give her dose of Levaquin here and screen labs.  If remainder of work-up is unremarkable, patient would be appropriate for discharge home with PCP follow-up.  She did have prior COVID-19 testing that was negative.  Lab work unremarkable.  Patient reports improvement of throat symptoms following GI cocktail, making it easier for her to take her antibiotics.  Counseled to complete course of antibiotics and follow-up with her PCP, otherwise return to the ED for new or worsening symptoms.  Patient agrees with plan.      ____________________________________________   FINAL CLINICAL IMPRESSION(S) / ED DIAGNOSES  Final diagnoses:  Community acquired pneumonia, unspecified laterality  Gastroesophageal reflux disease with esophagitis without hemorrhage     ED Discharge Orders         Ordered    lidocaine (XYLOCAINE) 2 % solution  As needed     04/04/19 1937           Note:  This document was prepared using Dragon voice recognition software and may include unintentional dictation errors.   Chesley NoonJessup, Katheryne Gorr, MD 04/05/19 [phone]

## 2019-04-04 NOTE — ED Notes (Signed)
ED Provider at bedside. 

## 2019-04-04 NOTE — ED Notes (Signed)
Pt states she can tolerate the GI cocktail. Pt states she has no chest pain when drinking it. Will try to finish the potassium drink in 30 mins. Will continue to monitor.

## 2019-04-04 NOTE — ED Triage Notes (Signed)
C/O right sided chest pain.  States seen on 03/30/19 for SOB and is being treated for pneumonia.  Patient is AAOx3.  Skin warm and dry. No SOB/ DOE.  NAD

## 2019-04-04 NOTE — ED Notes (Signed)
Pt only able to drink half of the potassium chloride. Pt states with every sip the pain gets worse. Dr. Charna Archer made aware. No new orders at this time. Will continue to monitor.

## 2019-04-04 NOTE — ED Triage Notes (Signed)
Pt c/o right chest pain x 1 week, pt here on 10/6 dx with PNA and is has 2 doses of atbx left. Pt states the pain in her chest has moved, but some of the tightness has improved.  Pt states she hasn't taken her atbx today because she hasn't eaten anything.

## 2019-04-04 NOTE — ED Notes (Signed)
Pt was able to finish the potassium chloride. Pt did state she had chest pain when consuming it. MD Charna Archer made aware. Will continue to monitor.

## 2019-04-05 ENCOUNTER — Encounter: Payer: Self-pay | Admitting: Primary Care

## 2019-04-05 ENCOUNTER — Ambulatory Visit (INDEPENDENT_AMBULATORY_CARE_PROVIDER_SITE_OTHER): Payer: BC Managed Care – PPO | Admitting: Primary Care

## 2019-04-05 DIAGNOSIS — J189 Pneumonia, unspecified organism: Secondary | ICD-10-CM | POA: Diagnosis not present

## 2019-04-05 NOTE — Progress Notes (Signed)
Subjective:    Patient ID: Shannon Mckenzie, female    DOB: 02-25-1961, 58 y.o.   MRN: [id number]  HPI  Virtual Visit via Video Note  I connected with Shannon Mckenzie on 04/05/19 at  9:40 AM EDT by a video enabled telemedicine application and verified that I am speaking with the correct person using two identifiers.  Location: Patient: Home Provider: Office   I discussed the limitations of evaluation and management by telemedicine and the availability of in person appointments. The patient expressed understanding and agreed to proceed.  History of Present Illness:  Ms. Leiphart is a 58 year old female patient of Dr. Selena Batten with a history of hypertension, extrinsic asthma, GERD, upper airway cough syndrome, who presents today for emergency department follow up.  She initially presented to [facility] ED on 03/30/2019 with a chief complaint of shortness of breath and a 3-4 history of productive cough, chills, fatigue. Also with a few episodes of nausea with vomiting, diarrhea. Work up in the ED showed infiltrate to the right middle lobe. Labs without leukocytosis, anemia. CT chest obtained which showed abnormalities that were consistent for multilobar pneumonia, large hiatal hernia. Covid-19 negative. Given stable presentation of symptoms she was discharged home with oral Levaquin and oral steroids and told to follow up with PCP.  She presented back to [facility] ED on 04/04/19 with a chief complaint of chest pain that began to left side with migration to right. She endorsed compliance to both antibiotics and steroids with some improvement. During this stay in the ED her labs and ECG were unremarkable. She was treated with GI cocktail which provided improvement in symptoms. She was discharged home again with recommendations for PCP follow up.  Since her ED visits she's feeling better but is having some burning to her chest when swallowing just after taking the Levaquin. Symptoms will go away shortly after. She  is compliant to her Nexium twice daily, also been eating Tums as needed that helps to improve symptoms. The ED provided her with a prescription for viscous lidocaine for which she's not yet filled. She has two days remaining on her Levaquin. She denies fevers, cough, shortness of breath.   Observations/Objective:  Alert and oriented. Appears well, not sickly. No distress. Speaking in complete sentences. No cough during exam.  Assessment and Plan:  Evaluated in the ED twice. Diagnosed with CAP, and then reflux from Levaquin use. Today she appears well, no distress/cough. Discussed side effects of Levaquin, she will complete her remaining two days. Continue prednisone. Continue Nexium as prescribed, Tums as needed. She will update if side effects persist after completion of Levaquin.  Hospital notes, labs, imaging reviewed.  Follow Up Instructions:  Continue taking levofloxacin antibiotic and prednisone steroids as prescribed.  Continue Nexium twice daily, okay to take Tums if needed.  Please call us if your symptoms of burning persist after a few days of being off of the Levaquin.  It was a pleasure meeting you! Mayra Reel, NP-C    I discussed the assessment and treatment plan with the patient. The patient was provided an opportunity to ask questions and all were answered. The patient agreed with the plan and demonstrated an understanding of the instructions.   The patient was advised to call back or seek an in-person evaluation if the symptoms worsen or if the condition fails to improve as anticipated.     Doreene Nest, NP    Review of Systems  Constitutional: Negative for fever.  HENT: Negative for congestion.   Respiratory: Negative for cough and shortness of breath.   Gastrointestinal:       Esophageal burning, chest burning  Neurological: Negative for dizziness.       Past Medical History:  Diagnosis Date  . Allergy   . Anemia   . Anxiety   . Asthma    . Depression   . DJD (degenerative joint disease) of hip   . Former smoker   . GERD (gastroesophageal reflux disease)   . Hand pain, right   . Hiatal hernia   . History of kidney stones    history  . Hyperlipidemia   . Hypertension   . Kidney stone   . Osteoarthritis   . Pain, hip    right  . Pneumonia    hx but no hospitalization  . Reactive airway disease   . RLS (restless legs syndrome)   . Routine gynecological examination    sees gynecology, [name] NP  . SOB (shortness of breath) 9/14   cardiac eval with stress test and echo; Dr. Tollie Eth  . Statin intolerance   . Vision problems    wears contact lenses     Social History   Socioeconomic History  . Marital status: Married    Spouse name: Marcello Moores  . Number of children: 1  . Years of education: 8th grade  . Highest education level: Not on file  Occupational History  . Occupation: Microbiologist: UNEMPLOYED  Social Needs  . Financial resource strain: Not hard at all  . Food insecurity    Worry: Not on file    Inability: Not on file  . Transportation needs    Medical: Not on file    Non-medical: Not on file  Tobacco Use  . Smoking status: Former Smoker    Packs/day: 1.00    Years: 15.00    Pack years: 15.00    Types: Cigarettes    Quit date: 06/25/1991    Years since quitting: 27.7  . Smokeless tobacco: Never Used  Substance and Sexual Activity  . Alcohol use: Yes    Alcohol/week: 0.0 standard drinks    Frequency: Never    Comment: wine every 6 months or so  . Drug use: No  . Sexual activity: Yes    Birth control/protection: Post-menopausal  Lifestyle  . Physical activity    Days per week: Not on file    Minutes per session: Not on file  . Stress: Not on file  Relationships  . Social Herbalist on phone: Not on file    Gets together: Not on file    Attends religious service: Not on file    Active member of club or organization: Not on file    Attends meetings of  clubs or organizations: Not on file    Relationship status: Not on file  . Intimate partner violence    Fear of current or ex partner: Not on file    Emotionally abused: Not on file    Physically abused: Not on file    Forced sexual activity: Not on file  Other Topics Concern  . Not on file  Social History Narrative   11/12/18   From: [place]   Living: with Husband   Work: used to clean houses, but stopped due to pain      Family: one son, also raised 2 nephews - close with family - also has 2 grandchildren  Enjoys: outdoors activity, spending time [place]       Exercise: still does rehab exercises - working towards goals   Diet: working to try and lose weight             Past Surgical History:  Procedure Laterality Date  . bilateral hip replacement     nov 21st 2019 right, Jun 09 2018 left   . CARPAL TUNNEL RELEASE Left 07/03/2017   Procedure: LEFT CARPAL TUNNEL RELEASE;  Surgeon: Betha Loa, MD;  Location: [facility];  Service: Orthopedics;  Laterality: Left;  . COLONOSCOPY  06/2012   Dr. Ramon Dredge; repeat 10 years  . UPPER GI ENDOSCOPY N/A 07/24/2012   [place] Hernia,normal stomach, normal examined duodenum  . WISDOM TOOTH EXTRACTION      Family History  Problem Relation Age of Onset  . Arthritis Mother   . Asthma Mother   . Diabetes Mother   . Cirrhosis Mother        died of liver nonalcoholic cirrhosis  . COPD Mother   . Heart disease Mother        CHF  . Hypertension Mother   . Kidney disease Mother   . Liver disease Mother   . Eating disorder Mother   . Obesity Mother   . Asthma Sister        doesnt know history well  . Drug abuse Sister   . Cancer Father        died of pancreatic cancer at age 43  . Alcohol abuse Father   . Depression Brother   . Alcohol abuse Brother   . Stroke Maternal Aunt        died age 60yo of CVA  . Other Brother        died of MVA  . Other Sister   . Drug abuse Sister        doesnt know  history well  . Heart attack Maternal Grandfather 70    Allergies  Allergen Reactions  . Statins     Myalgias with all statins  . Amoxicillin Hives and Rash  . Penicillins Hives, Other (See Comments) and Rash    Current Outpatient Medications on File Prior to Visit  Medication Sig Dispense Refill  . acetaminophen (TYLENOL) 325 MG tablet Take 650 mg by mouth every 8 (eight) hours as needed.    Marland Kitchen albuterol (PROVENTIL HFA;VENTOLIN HFA) 108 (90 Base) MCG/ACT inhaler Inhale 2 puffs into the lungs every 6 (six) hours as needed for wheezing or shortness of breath. 1 Inhaler 2  . albuterol (PROVENTIL) (2.5 MG/3ML) 0.083% nebulizer solution Take 3 mLs (2.5 mg total) by nebulization every 6 (six) hours as needed for wheezing or shortness of breath. 150 mL 1  . cholecalciferol (VITAMIN D3) 25 MCG (1000 UT) tablet Take 1,000 Units by mouth daily.    . Cyanocobalamin (B-12) 1000 MCG CAPS Take by mouth.    . diclofenac (VOLTAREN) 75 MG EC tablet Take 1 tablet (75 mg total) by mouth 2 (two) times daily. 180 tablet 1  . escitalopram (LEXAPRO) 20 MG tablet TAKE 1 TABLET(20 MG) BY MOUTH DAILY 90 tablet 0  . esomeprazole (NEXIUM) 40 MG capsule Take 1 capsule (40 mg total) by mouth 2 (two) times daily. 60 capsule 3  . ezetimibe (ZETIA) 10 MG tablet TAKE 1 TABLET(10 MG) BY MOUTH DAILY 90 tablet 1  . fluticasone (FLONASE) 50 MCG/ACT nasal spray Place 1 spray into both nostrils daily. 16 g 2  .  fluticasone furoate-vilanterol (BREO ELLIPTA) 100-25 MCG/INH AEPB INHALE 1 PUFF INTO THE LUNGS DAILY 60 each 2  . levofloxacin (LEVAQUIN) 750 MG tablet Take 1 tablet (750 mg total) by mouth daily for 6 days. 6 tablet 0  . lidocaine (XYLOCAINE) 2 % solution Use as directed 15 mLs in the mouth or throat as needed for mouth pain. 100 mL 0  . losartan (COZAAR) 50 MG tablet TAKE 1 TABLET(50 MG) BY MOUTH DAILY 90 tablet 1  . montelukast (SINGULAIR) 10 MG tablet Take 1 tablet (10 mg total) by mouth at bedtime. 90 tablet 2    No current facility-administered medications on file prior to visit.     BP 132/81   Pulse 85   Temp 98.1 F (36.7 C)   Ht 5\' 2"  (1.575 m)   Wt 191 lb (86.6 kg)   LMP 03/13/2015   BMI 34.93 kg/m    Objective:   Physical Exam  Constitutional: She is oriented to person, place, and time. She appears well-nourished. She does not appear ill.  Respiratory: Effort normal. No respiratory distress.  No cough  Neurological: She is alert and oriented to person, place, and time.  Psychiatric: She has a normal mood and affect.           Assessment & Plan:

## 2019-04-05 NOTE — Assessment & Plan Note (Signed)
Evaluated in the ED twice. Diagnosed with CAP, and then reflux from Levaquin use. Today she appears well, no distress/cough. Discussed side effects of Levaquin, she will complete her remaining two days. Continue prednisone. Continue Nexium as prescribed, Tums as needed. She will update if side effects persist after completion of Levaquin.

## 2019-04-05 NOTE — Patient Instructions (Signed)
Continue taking levofloxacin antibiotic and prednisone steroids as prescribed.  Continue Nexium twice daily, okay to take Tums if needed.  Please call us if your symptoms of burning persist after a few days of being off of the Levaquin.  It was a pleasure meeting you! Allie Bossier, NP-C

## 2019-04-14 DIAGNOSIS — K2100 Gastro-esophageal reflux disease with esophagitis, without bleeding: Secondary | ICD-10-CM | POA: Diagnosis not present

## 2019-04-14 DIAGNOSIS — K449 Diaphragmatic hernia without obstruction or gangrene: Secondary | ICD-10-CM | POA: Diagnosis not present

## 2019-04-14 DIAGNOSIS — Z96643 Presence of artificial hip joint, bilateral: Secondary | ICD-10-CM | POA: Diagnosis not present

## 2019-04-14 DIAGNOSIS — J189 Pneumonia, unspecified organism: Secondary | ICD-10-CM | POA: Diagnosis not present

## 2019-04-14 DIAGNOSIS — D649 Anemia, unspecified: Secondary | ICD-10-CM | POA: Diagnosis not present

## 2019-04-15 ENCOUNTER — Other Ambulatory Visit: Payer: Self-pay | Admitting: Family Medicine

## 2019-05-03 ENCOUNTER — Other Ambulatory Visit: Payer: Self-pay

## 2019-05-03 ENCOUNTER — Encounter: Payer: Self-pay | Admitting: Family Medicine

## 2019-05-03 ENCOUNTER — Ambulatory Visit: Payer: BC Managed Care – PPO | Admitting: Family Medicine

## 2019-05-03 ENCOUNTER — Ambulatory Visit (INDEPENDENT_AMBULATORY_CARE_PROVIDER_SITE_OTHER): Payer: BC Managed Care – PPO | Admitting: Family Medicine

## 2019-05-03 VITALS — BP 134/91 | HR 76 | Temp 97.9°F | Ht 62.0 in | Wt 192.0 lb

## 2019-05-03 DIAGNOSIS — F411 Generalized anxiety disorder: Secondary | ICD-10-CM | POA: Diagnosis not present

## 2019-05-03 DIAGNOSIS — F325 Major depressive disorder, single episode, in full remission: Secondary | ICD-10-CM | POA: Diagnosis not present

## 2019-05-03 DIAGNOSIS — J4530 Mild persistent asthma, uncomplicated: Secondary | ICD-10-CM | POA: Diagnosis not present

## 2019-05-03 DIAGNOSIS — I10 Essential (primary) hypertension: Secondary | ICD-10-CM | POA: Diagnosis not present

## 2019-05-03 DIAGNOSIS — K219 Gastro-esophageal reflux disease without esophagitis: Secondary | ICD-10-CM

## 2019-05-03 DIAGNOSIS — E785 Hyperlipidemia, unspecified: Secondary | ICD-10-CM

## 2019-05-03 NOTE — Assessment & Plan Note (Signed)
Stable currently. Some marriage stressors but handling them well.

## 2019-05-03 NOTE — Patient Instructions (Signed)
Your blood pressure high.   High blood pressure increases your risk for heart attack and stroke.    Please check your blood pressure 2-4 times a week.   To check your blood pressure 1) Sit in a quiet and relaxed place for 5 minutes 2) Make sure your feet are flat on the ground 3) Consider checking first thing in the morning   Normal blood pressure is less than 140/90 Ideally you blood pressure should be around 120/80  Other ways you can reduce your blood pressure:  1) Regular exercise -- Try to get 150 minutes (30 minutes, 5 days a week) of moderate to vigorous aerobic excercise -- Examples: brisk walking (2.5 miles per hour), water aerobics, dancing, gardening, tennis, biking slower than 10 miles per hour 2) DASH Diet - low fat meats, more fresh fruits and vegetables, whole grains, low salt 3) Quit smoking if you smoke 4) Loose 5-10% of your body weight  

## 2019-05-03 NOTE — Assessment & Plan Note (Signed)
Worse following pneumonia. Has GI follow-up

## 2019-05-03 NOTE — Assessment & Plan Note (Signed)
Taking medications - zetia due to side effects. Will repeat lipids at annual.

## 2019-05-03 NOTE — Progress Notes (Addendum)
I connected with Shannon Mckenzie on 05/03/19 at 11:20 AM EST by video and verified that I am speaking with the correct person using two identifiers.   I discussed the limitations, risks, security and privacy concerns of performing an evaluation and management service by video and the availability of in person appointments. I also discussed with the patient that there may be a patient responsible charge related to this service. The patient expressed understanding and agreed to proceed.  Patient location: Home Provider Location: Provider home office Participants: Shannon Mckenzie and Shannon Mckenzie   Subjective:     Shannon Mckenzie is a 58 y.o. female presenting for 6 months follow up     HPI  #HTN - does not forget medication - losartan 50 mg daily  - no cp, ha,  - does endorse some vision worsening  #Asthma - had pneumonia recently  - had issues with hiatal hernia and follow-up with gastroenterologist - is coughing a lot of things up now - did not cough as much with the pneumonia - not a lot of wheezing - coughing is after eating or morning - is considering trying mucinex  #Anxiety/Depression - OK with lexapro - symptoms are not worse, does have some occasional flare ups with family stress - has asked husband to go therapy   Review of Systems  Constitutional: Negative for chills and fever.  Respiratory: Positive for cough. Negative for shortness of breath.   Cardiovascular: Negative for chest pain.  Neurological: Negative for headaches.     Social History   Tobacco Use  Smoking Status Former Smoker  . Packs/day: 1.00  . Years: 15.00  . Pack years: 15.00  . Types: Cigarettes  . Quit date: 06/25/1991  . Years since quitting: 27.8  Smokeless Tobacco Never Used        Objective:   BP Readings from Last 3 Encounters:  05/03/19 (!) 134/91  04/05/19 132/81  04/04/19 133/81   Wt Readings from Last 3 Encounters:  05/03/19 192 lb (87.1 kg)  04/05/19 191 lb  (86.6 kg)  04/04/19 195 lb (88.5 kg)   BP (!) 134/91 Comment: per patient  Pulse 76 Comment: per patient  Temp 97.9 F (36.6 C) Comment: per patient  Ht 5\' 2"  (1.575 m)   Wt 192 lb (87.1 kg) Comment: per patient  LMP 03/13/2015   BMI 35.12 kg/m    Physical Exam Constitutional:      Appearance: Normal appearance. She is not ill-appearing.  HENT:     Head: Normocephalic and atraumatic.     Right Ear: External ear normal.     Left Ear: External ear normal.  Eyes:     Conjunctiva/sclera: Conjunctivae normal.  Pulmonary:     Effort: Pulmonary effort is normal. No respiratory distress.  Neurological:     Mental Status: She is alert. Mental status is at baseline.  Psychiatric:        Mood and Affect: Mood normal.        Behavior: Behavior normal.        Thought Content: Thought content normal.        Judgment: Judgment normal.             Assessment & Plan:   Problem List Items Addressed This Visit      Cardiovascular and Mediastinum   Essential hypertension, benign - Primary    Previously with good control, though a little elevated today. Will do home monitoring and mychart in 1  week. If elevated will do dose increase of losartan.         Respiratory   Extrinsic asthma, mild persistent, uncomplicated    Well controlled, though recovering from pneumonia. Discussed XR in 2 weeks. Notify if coughing is worsening or fevers.         Digestive   GERD (gastroesophageal reflux disease)    Worse following pneumonia. Has GI follow-up        Other   Hyperlipidemia    Taking medications - zetia due to side effects. Will repeat lipids at annual.        Major depression in full remission (HCC)    Stable currently. Some marriage stressors but handling them well.       GAD (generalized anxiety disorder)       Return in about 6 months (around 10/31/2019).  [name], MD

## 2019-05-03 NOTE — Assessment & Plan Note (Signed)
Well controlled, though recovering from pneumonia. Discussed XR in 2 weeks. Notify if coughing is worsening or fevers.

## 2019-05-03 NOTE — Assessment & Plan Note (Signed)
Previously with good control, though a little elevated today. Will do home monitoring and mychart in 1 week. If elevated will do dose increase of losartan.

## 2019-05-04 DIAGNOSIS — Z130 Encounter for screening for diseases of the blood and blood-forming organs and certain disorders involving the immune mechanism: Secondary | ICD-10-CM | POA: Diagnosis not present

## 2019-05-04 DIAGNOSIS — Z6835 Body mass index (BMI) 35.0-35.9, adult: Secondary | ICD-10-CM | POA: Diagnosis not present

## 2019-05-04 DIAGNOSIS — Z1231 Encounter for screening mammogram for malignant neoplasm of breast: Secondary | ICD-10-CM | POA: Diagnosis not present

## 2019-05-04 DIAGNOSIS — Z780 Asymptomatic menopausal state: Secondary | ICD-10-CM | POA: Diagnosis not present

## 2019-05-04 DIAGNOSIS — Z01419 Encounter for gynecological examination (general) (routine) without abnormal findings: Secondary | ICD-10-CM | POA: Diagnosis not present

## 2019-05-08 ENCOUNTER — Other Ambulatory Visit: Payer: Self-pay | Admitting: Primary Care

## 2019-05-08 DIAGNOSIS — F411 Generalized anxiety disorder: Secondary | ICD-10-CM

## 2019-05-08 DIAGNOSIS — F325 Major depressive disorder, single episode, in full remission: Secondary | ICD-10-CM

## 2019-05-25 ENCOUNTER — Ambulatory Visit
Admission: RE | Admit: 2019-05-25 | Discharge: 2019-05-25 | Disposition: A | Discharge status: Home / Self Care | Payer: BC Managed Care – PPO | Source: Ambulatory Visit | Attending: Physician Assistant | Admitting: Physician Assistant

## 2019-05-25 ENCOUNTER — Other Ambulatory Visit: Payer: Self-pay | Admitting: Physician Assistant

## 2019-05-25 DIAGNOSIS — J189 Pneumonia, unspecified organism: Secondary | ICD-10-CM

## 2019-05-25 DIAGNOSIS — R05 Cough: Secondary | ICD-10-CM | POA: Diagnosis not present

## 2019-05-28 DIAGNOSIS — D509 Iron deficiency anemia, unspecified: Secondary | ICD-10-CM | POA: Diagnosis not present

## 2019-05-28 DIAGNOSIS — K2100 Gastro-esophageal reflux disease with esophagitis, without bleeding: Secondary | ICD-10-CM | POA: Diagnosis not present

## 2019-05-28 DIAGNOSIS — K449 Diaphragmatic hernia without obstruction or gangrene: Secondary | ICD-10-CM | POA: Diagnosis not present

## 2019-06-02 ENCOUNTER — Other Ambulatory Visit: Payer: Self-pay | Admitting: Family Medicine

## 2019-06-04 ENCOUNTER — Other Ambulatory Visit: Payer: Self-pay | Admitting: Family Medicine

## 2019-06-04 DIAGNOSIS — M1990 Unspecified osteoarthritis, unspecified site: Secondary | ICD-10-CM

## 2019-06-10 DIAGNOSIS — D509 Iron deficiency anemia, unspecified: Secondary | ICD-10-CM | POA: Diagnosis not present

## 2019-06-28 DIAGNOSIS — D509 Iron deficiency anemia, unspecified: Secondary | ICD-10-CM | POA: Diagnosis not present

## 2019-07-07 ENCOUNTER — Other Ambulatory Visit: Payer: Self-pay | Admitting: Family Medicine

## 2019-07-08 DIAGNOSIS — Z1159 Encounter for screening for other viral diseases: Secondary | ICD-10-CM | POA: Diagnosis not present

## 2019-07-13 DIAGNOSIS — D122 Benign neoplasm of ascending colon: Secondary | ICD-10-CM | POA: Diagnosis not present

## 2019-07-13 DIAGNOSIS — K449 Diaphragmatic hernia without obstruction or gangrene: Secondary | ICD-10-CM | POA: Diagnosis not present

## 2019-07-13 DIAGNOSIS — D509 Iron deficiency anemia, unspecified: Secondary | ICD-10-CM | POA: Diagnosis not present

## 2019-07-13 DIAGNOSIS — R12 Heartburn: Secondary | ICD-10-CM | POA: Diagnosis not present

## 2019-07-13 DIAGNOSIS — K635 Polyp of colon: Secondary | ICD-10-CM | POA: Diagnosis not present

## 2019-07-13 DIAGNOSIS — K922 Gastrointestinal hemorrhage, unspecified: Secondary | ICD-10-CM | POA: Diagnosis not present

## 2019-07-13 DIAGNOSIS — K2211 Ulcer of esophagus with bleeding: Secondary | ICD-10-CM | POA: Diagnosis not present

## 2019-07-13 DIAGNOSIS — D123 Benign neoplasm of transverse colon: Secondary | ICD-10-CM | POA: Diagnosis not present

## 2019-07-13 DIAGNOSIS — K2100 Gastro-esophageal reflux disease with esophagitis, without bleeding: Secondary | ICD-10-CM | POA: Diagnosis not present

## 2019-07-13 DIAGNOSIS — K2930 Chronic superficial gastritis without bleeding: Secondary | ICD-10-CM | POA: Diagnosis not present

## 2019-07-13 DIAGNOSIS — K3189 Other diseases of stomach and duodenum: Secondary | ICD-10-CM | POA: Diagnosis not present

## 2019-07-13 LAB — HM COLONOSCOPY

## 2019-07-21 DIAGNOSIS — K2210 Ulcer of esophagus without bleeding: Secondary | ICD-10-CM | POA: Diagnosis not present

## 2019-07-27 ENCOUNTER — Encounter: Payer: Self-pay | Admitting: Gastroenterology

## 2019-07-29 ENCOUNTER — Encounter: Payer: Self-pay | Admitting: *Deleted

## 2019-09-10 ENCOUNTER — Emergency Department: Payer: BC Managed Care – PPO

## 2019-09-10 ENCOUNTER — Encounter: Payer: Self-pay | Admitting: Emergency Medicine

## 2019-09-10 ENCOUNTER — Observation Stay
Admission: EM | Admit: 2019-09-10 | Discharge: 2019-09-11 | Disposition: A | Discharge status: Home / Self Care | Payer: BC Managed Care – PPO | Attending: Internal Medicine | Admitting: Internal Medicine

## 2019-09-10 ENCOUNTER — Other Ambulatory Visit: Payer: Self-pay

## 2019-09-10 DIAGNOSIS — J189 Pneumonia, unspecified organism: Secondary | ICD-10-CM

## 2019-09-10 DIAGNOSIS — I10 Essential (primary) hypertension: Secondary | ICD-10-CM | POA: Diagnosis not present

## 2019-09-10 DIAGNOSIS — D649 Anemia, unspecified: Secondary | ICD-10-CM | POA: Diagnosis not present

## 2019-09-10 DIAGNOSIS — K449 Diaphragmatic hernia without obstruction or gangrene: Secondary | ICD-10-CM | POA: Diagnosis not present

## 2019-09-10 DIAGNOSIS — Z79899 Other long term (current) drug therapy: Secondary | ICD-10-CM | POA: Insufficient documentation

## 2019-09-10 DIAGNOSIS — Z888 Allergy status to other drugs, medicaments and biological substances status: Secondary | ICD-10-CM | POA: Diagnosis not present

## 2019-09-10 DIAGNOSIS — K219 Gastro-esophageal reflux disease without esophagitis: Secondary | ICD-10-CM | POA: Diagnosis not present

## 2019-09-10 DIAGNOSIS — E785 Hyperlipidemia, unspecified: Secondary | ICD-10-CM | POA: Diagnosis not present

## 2019-09-10 DIAGNOSIS — J4530 Mild persistent asthma, uncomplicated: Secondary | ICD-10-CM | POA: Diagnosis present

## 2019-09-10 DIAGNOSIS — F411 Generalized anxiety disorder: Secondary | ICD-10-CM | POA: Diagnosis not present

## 2019-09-10 DIAGNOSIS — J690 Pneumonitis due to inhalation of food and vomit: Principal | ICD-10-CM | POA: Insufficient documentation

## 2019-09-10 DIAGNOSIS — J45902 Unspecified asthma with status asthmaticus: Secondary | ICD-10-CM

## 2019-09-10 DIAGNOSIS — Z880 Allergy status to penicillin: Secondary | ICD-10-CM | POA: Diagnosis not present

## 2019-09-10 DIAGNOSIS — Z20822 Contact with and (suspected) exposure to covid-19: Secondary | ICD-10-CM | POA: Diagnosis not present

## 2019-09-10 DIAGNOSIS — Z87891 Personal history of nicotine dependence: Secondary | ICD-10-CM | POA: Insufficient documentation

## 2019-09-10 DIAGNOSIS — Z96643 Presence of artificial hip joint, bilateral: Secondary | ICD-10-CM | POA: Diagnosis not present

## 2019-09-10 DIAGNOSIS — F329 Major depressive disorder, single episode, unspecified: Secondary | ICD-10-CM | POA: Insufficient documentation

## 2019-09-10 DIAGNOSIS — F32A Depression, unspecified: Secondary | ICD-10-CM | POA: Diagnosis present

## 2019-09-10 DIAGNOSIS — Z7951 Long term (current) use of inhaled steroids: Secondary | ICD-10-CM | POA: Insufficient documentation

## 2019-09-10 DIAGNOSIS — R509 Fever, unspecified: Secondary | ICD-10-CM | POA: Diagnosis not present

## 2019-09-10 HISTORY — DX: Pneumonitis due to inhalation of food and vomit: J69.0

## 2019-09-10 LAB — COMPREHENSIVE METABOLIC PANEL
ALT: 17 U/L (ref 0–44)
AST: 22 U/L (ref 15–41)
Albumin: 4 g/dL (ref 3.5–5.0)
Alkaline Phosphatase: 86 U/L (ref 38–126)
Anion gap: 11 (ref 5–15)
BUN: 19 mg/dL (ref 6–20)
CO2: 23 mmol/L (ref 22–32)
Calcium: 9 mg/dL (ref 8.9–10.3)
Chloride: 107 mmol/L (ref 98–111)
Creatinine, Ser: 0.83 mg/dL (ref 0.44–1.00)
GFR calc Af Amer: >60 mL/min (ref >60)
GFR calc non Af Amer: >60 mL/min (ref >60)
Glucose, Bld: 107 mg/dL — ABNORMAL HIGH (ref 70–99)
Potassium: 3.9 mmol/L (ref 3.5–5.1)
Sodium: 141 mmol/L (ref 135–145)
Total Bilirubin: 0.6 mg/dL (ref 0.3–1.2)
Total Protein: 6.9 g/dL (ref 6.5–8.1)

## 2019-09-10 LAB — CBC WITH DIFFERENTIAL/PLATELET
Abs Immature Granulocytes: 0.03 10*3/uL (ref 0.00–0.07)
Basophils Absolute: 0 10*3/uL (ref 0.0–0.1)
Basophils Relative: 0 %
Eosinophils Absolute: 0.1 10*3/uL (ref 0.0–0.5)
Eosinophils Relative: 1 %
HCT: 39.7 % (ref 36.0–46.0)
Hemoglobin: 13.3 g/dL (ref 12.0–15.0)
Immature Granulocytes: 0 %
Lymphocytes Relative: 6 %
Lymphs Abs: 0.6 10*3/uL — ABNORMAL LOW (ref 0.7–4.0)
MCH: 28.2 pg (ref 26.0–34.0)
MCHC: 33.5 g/dL (ref 30.0–36.0)
MCV: 84.3 fL (ref 80.0–100.0)
Monocytes Absolute: 0.4 10*3/uL (ref 0.1–1.0)
Monocytes Relative: 4 %
Neutro Abs: 8.6 10*3/uL — ABNORMAL HIGH (ref 1.7–7.7)
Neutrophils Relative %: 89 %
Platelets: 153 10*3/uL (ref 150–400)
RBC: 4.71 MIL/uL (ref 3.87–5.11)
RDW: 14.6 % (ref 11.5–15.5)
WBC: 9.8 10*3/uL (ref 4.0–10.5)
nRBC: 0 % (ref 0.0–0.2)

## 2019-09-10 LAB — URINALYSIS, COMPLETE (UACMP) WITH MICROSCOPIC
Bacteria, UA: NONE SEEN
Bilirubin Urine: NEGATIVE
Glucose, UA: NEGATIVE mg/dL
Hgb urine dipstick: NEGATIVE
Ketones, ur: NEGATIVE mg/dL
Leukocytes,Ua: NEGATIVE
Nitrite: NEGATIVE
Protein, ur: NEGATIVE mg/dL
Specific Gravity, Urine: 1.018 (ref 1.005–1.030)
pH: 5 (ref 5.0–8.0)

## 2019-09-10 LAB — SARS CORONAVIRUS 2 (TAT 6-24 HRS): SARS Coronavirus 2: NEGATIVE

## 2019-09-10 LAB — POC SARS CORONAVIRUS 2 AG: SARS Coronavirus 2 Ag: NEGATIVE

## 2019-09-10 LAB — LACTIC ACID, PLASMA
Lactic Acid, Venous: 2.3 mmol/L (ref 0.5–1.9)
Lactic Acid, Venous: 2.2 mmol/L (ref 0.5–1.9)
Lactic Acid, Venous: 1.7 mmol/L (ref 0.5–1.9)

## 2019-09-10 MED ORDER — SODIUM CHLORIDE 0.9 % IV SOLN: 500.0000 mg | INTRAVENOUS | Status: DC

## 2019-09-10 MED ORDER — ALBUTEROL SULFATE (2.5 MG/3ML) 0.083% IN NEBU: 2.5000 mg | INHALATION_SOLUTION | Freq: Four times a day (QID) | RESPIRATORY_TRACT | Status: DC | PRN

## 2019-09-10 MED ORDER — SUCRALFATE 1 G PO TABS
1.0000 g | ORAL_TABLET | Freq: Three times a day (TID) | ORAL | Status: DC
  Administered 2019-09-10 – 2019-09-11 (×4): 1 g via ORAL
  Filled 2019-09-10 (×4): qty 1

## 2019-09-10 MED ORDER — ONDANSETRON HCL 4 MG/2ML IJ SOLN: 4.0000 mg | Freq: Four times a day (QID) | INTRAMUSCULAR | Status: DC | PRN

## 2019-09-10 MED ORDER — FERROUS SULFATE 325 (65 FE) MG PO TABS
325.0000 mg | ORAL_TABLET | Freq: Every day | ORAL | Status: DC
  Administered 2019-09-11: 10:00:00 325 mg via ORAL
  Filled 2019-09-10 (×2): qty 1

## 2019-09-10 MED ORDER — MONTELUKAST SODIUM 10 MG PO TABS
10.0000 mg | ORAL_TABLET | Freq: Every day | ORAL | Status: DC
  Administered 2019-09-10: 10 mg via ORAL
  Filled 2019-09-10 (×2): qty 1

## 2019-09-10 MED ORDER — SODIUM CHLORIDE 0.9 % IV BOLUS
1000.0000 mL | Freq: Once | INTRAVENOUS | Status: AC
  Administered 2019-09-10: 1000 mL via INTRAVENOUS

## 2019-09-10 MED ORDER — FLUTICASONE FUROATE-VILANTEROL 100-25 MCG/INH IN AEPB
1.0000 | INHALATION_SPRAY | Freq: Every day | RESPIRATORY_TRACT | Status: DC
  Administered 2019-09-11: 1 via RESPIRATORY_TRACT
  Filled 2019-09-10: qty 28

## 2019-09-10 MED ORDER — SODIUM CHLORIDE 0.9 % IV SOLN: 2.0000 g | INTRAVENOUS | Status: DC

## 2019-09-10 MED ORDER — EZETIMIBE 10 MG PO TABS
10.0000 mg | ORAL_TABLET | Freq: Every day | ORAL | Status: DC
  Administered 2019-09-11: 10 mg via ORAL
  Filled 2019-09-10 (×2): qty 1

## 2019-09-10 MED ORDER — SODIUM CHLORIDE 0.9 % IV SOLN: INTRAVENOUS | Status: DC

## 2019-09-10 MED ORDER — LEVOFLOXACIN IN D5W 750 MG/150ML IV SOLN
750.0000 mg | INTRAVENOUS | Status: DC
  Administered 2019-09-10: 750 mg via INTRAVENOUS
  Filled 2019-09-10 (×2): qty 150

## 2019-09-10 MED ORDER — FLUTICASONE PROPIONATE 50 MCG/ACT NA SUSP
1.0000 | Freq: Every day | NASAL | Status: DC
  Administered 2019-09-11: 1 via NASAL
  Filled 2019-09-10: qty 16

## 2019-09-10 MED ORDER — PANTOPRAZOLE SODIUM 40 MG PO TBEC
40.0000 mg | DELAYED_RELEASE_TABLET | Freq: Every day | ORAL | Status: DC
  Administered 2019-09-11: 10:00:00 40 mg via ORAL
  Filled 2019-09-10: qty 1

## 2019-09-10 MED ORDER — ADULT MULTIVITAMIN W/MINERALS CH
1.0000 | ORAL_TABLET | Freq: Every day | ORAL | Status: DC
  Administered 2019-09-10 – 2019-09-11 (×2): 1 via ORAL
  Filled 2019-09-10 (×2): qty 1

## 2019-09-10 MED ORDER — ENOXAPARIN SODIUM 40 MG/0.4ML ~~LOC~~ SOLN
40.0000 mg | SUBCUTANEOUS | Status: DC
  Administered 2019-09-10: 40 mg via SUBCUTANEOUS
  Filled 2019-09-10: qty 0.4

## 2019-09-10 MED ORDER — VITAMIN D 25 MCG (1000 UNIT) PO TABS
1000.0000 [IU] | ORAL_TABLET | Freq: Every day | ORAL | Status: DC
  Administered 2019-09-10 – 2019-09-11 (×2): 1000 [IU] via ORAL
  Filled 2019-09-10 (×2): qty 1

## 2019-09-10 MED ORDER — LEVOFLOXACIN IN D5W 750 MG/150ML IV SOLN
750.0000 mg | Freq: Once | INTRAVENOUS | Status: DC
  Filled 2019-09-10: qty 150

## 2019-09-10 MED ORDER — SODIUM CHLORIDE 0.9 % IV SOLN
500.0000 mg | Freq: Once | INTRAVENOUS | Status: AC
  Administered 2019-09-10: 09:00:00 500 mg via INTRAVENOUS
  Filled 2019-09-10: qty 500

## 2019-09-10 MED ORDER — ACETAMINOPHEN 325 MG PO TABS
650.0000 mg | ORAL_TABLET | Freq: Three times a day (TID) | ORAL | Status: DC | PRN
  Administered 2019-09-10 – 2019-09-11 (×2): 650 mg via ORAL
  Filled 2019-09-10 (×2): qty 2

## 2019-09-10 MED ORDER — ESCITALOPRAM OXALATE 10 MG PO TABS
20.0000 mg | ORAL_TABLET | Freq: Every day | ORAL | Status: DC
  Administered 2019-09-11: 20 mg via ORAL
  Filled 2019-09-10 (×2): qty 2

## 2019-09-10 MED ORDER — SODIUM CHLORIDE 0.9 % IV SOLN
2.0000 g | Freq: Once | INTRAVENOUS | Status: AC
  Administered 2019-09-10: 2 g via INTRAVENOUS
  Filled 2019-09-10: qty 20

## 2019-09-10 MED ORDER — LOSARTAN POTASSIUM 50 MG PO TABS
50.0000 mg | ORAL_TABLET | Freq: Every day | ORAL | Status: DC
  Administered 2019-09-10 – 2019-09-11 (×2): 50 mg via ORAL
  Filled 2019-09-10 (×2): qty 1

## 2019-09-10 NOTE — ED Notes (Signed)
Patient has no further complaints of nausea or diarrhea.

## 2019-09-10 NOTE — Progress Notes (Signed)
CODE SEPSIS - PHARMACY COMMUNICATION  **Broad Spectrum Antibiotics should be administered within 1 hour of Sepsis diagnosis**  Time Code Sepsis Called/Page Received: 3685  Antibiotics Ordered: levofloxacin   Time of 1st antibiotic administration: 0825    Katha Cabal ,PharmD Clinical Pharmacist  09/10/2019  7:58 AM

## 2019-09-10 NOTE — ED Notes (Signed)
Pt began having some itching and has redness above IV site where azithromycin was infusing. Medication stopped at this time.  Added to allergy list and updated Pharmacy and admit MD

## 2019-09-10 NOTE — Progress Notes (Signed)
Notified provider  and bedside RN of need to order repeat lactic acid.  

## 2019-09-10 NOTE — ED Provider Notes (Signed)
[facility] Emergency Department Provider Note  Time seen: 7:34 AM  I have reviewed the triage vital signs and the nursing notes.   HISTORY  Chief Complaint Fever   HPI Shannon Mckenzie is a 59 y.o. female with a past medical history of anxiety, anemia, hypertension, hyperlipidemia, presents to the emergency department for chills body aches cough.  According to the patient overnight last night she began with chills, states she could not stop shaking, states she is experiencing body aches and a cough.  Occasional thick sputum production with her cough.  Patient states she had a slight cough last week but went away and then started coughing again last night.  States one episode of vomiting this morning.  No diarrhea.  No chest pain.  No significant shortness of breath.  No abdominal pain.   Past Medical History:  Diagnosis Date  . Allergy   . Anemia   . Anxiety   . Asthma   . Depression   . DJD (degenerative joint disease) of hip   . Former smoker   . GERD (gastroesophageal reflux disease)   . Hand pain, right   . Hiatal hernia   . History of kidney stones    history  . Hyperlipidemia   . Hypertension   . Kidney stone   . Osteoarthritis   . Pain, hip    right  . Pneumonia    hx but no hospitalization  . Reactive airway disease   . RLS (restless legs syndrome)   . Routine gynecological examination    sees gynecology, [name] NP  . SOB (shortness of breath) 9/14   cardiac eval with stress test and echo; Dr. [name]  . Statin intolerance   . Vision problems    wears contact lenses    Patient Active Problem List   Diagnosis Date Noted  . CAP (community acquired pneumonia) 04/05/2019  . Murmur 07/16/2018  . GERD (gastroesophageal reflux disease) 01/10/2018  . Allergic rhinitis 05/26/2017  . Carpal tunnel syndrome 05/26/2017  . Former smoker 05/26/2017  . Major depression in full remission (HCC) 05/08/2017  . GAD (generalized anxiety  disorder) 05/08/2017  . Iron deficiency 02/20/2016  . Osteoarthritis 03/13/2015  . Cough 03/13/2015  . Back pain 11/10/2014  . Rash and nonspecific skin eruption 07/21/2014  . Cold feeling 05/03/2014  . Sinusitis, chronic 06/14/2013  . Anemia 04/16/2013  . Extrinsic asthma, mild persistent, uncomplicated 03/20/2013  . Upper airway cough syndrome ? all related to sinus dz 03/19/2013  . Essential hypertension, benign 04/15/2012  . Obesity 04/15/2012  . Hyperlipidemia 04/15/2012    Past Surgical History:  Procedure Laterality Date  . bilateral hip replacement     nov 21st 2019 right, Jun 09 2018 left   . CARPAL TUNNEL RELEASE Left 07/03/2017   Procedure: LEFT CARPAL TUNNEL RELEASE;  Surgeon: Betha Loa, MD;  Location: [facility];  Service: Orthopedics;  Laterality: Left;  . COLONOSCOPY  06/2012   Dr. Ramon Dredge; repeat 10 years  . UPPER GI ENDOSCOPY N/A 07/24/2012   Hiatus Hernia,normal stomach, normal examined duodenum  . WISDOM TOOTH EXTRACTION      Prior to Admission medications   Medication Sig Start Date End Date Taking? Authorizing Provider  acetaminophen (TYLENOL) 325 MG tablet Take 650 mg by mouth every 8 (eight) hours as needed.    [provider]  albuterol (PROVENTIL HFA;VENTOLIN HFA) 108 (90 Base) MCG/ACT inhaler Inhale 2 puffs into the lungs every 6 (six) hours  as needed for wheezing or shortness of breath. 04/29/18   Inda Coke, PA  albuterol (PROVENTIL) (2.5 MG/3ML) 0.083% nebulizer solution Take 3 mLs (2.5 mg total) by nebulization every 6 (six) hours as needed for wheezing or shortness of breath. 11/12/18   Lesleigh Noe, MD  BREO ELLIPTA 100-25 MCG/INH AEPB INHALE 1 PUFF INTO THE LUNGS DAILY 06/02/19   Lesleigh Noe, MD  cholecalciferol (VITAMIN D3) 25 MCG (1000 UT) tablet Take 1,000 Units by mouth daily.    [provider]  escitalopram (LEXAPRO) 20 MG tablet TAKE 1 TABLET BY MOUTH EVERY DAY 05/11/19   Lesleigh Noe, MD   esomeprazole (NEXIUM) 40 MG capsule Take 1 capsule (40 mg total) by mouth 2 (two) times daily. 03/05/19   Lesleigh Noe, MD  ezetimibe (ZETIA) 10 MG tablet TAKE 1 TABLET BY MOUTH EVERY DAY 07/07/19   Lesleigh Noe, MD  fluticasone [facility]) 50 MCG/ACT nasal spray SHAKE LIQUID AND USE 1 SPRAY IN EACH NOSTRIL DAILY 06/04/19   Lesleigh Noe, MD  losartan (COZAAR) 50 MG tablet TAKE 1 TABLET(50 MG) BY MOUTH DAILY 04/15/19   Marin Olp, MD  montelukast (SINGULAIR) 10 MG tablet Take 1 tablet (10 mg total) by mouth at bedtime. 01/01/19   Lesleigh Noe, MD  sucralfate (CARAFATE) 1 g tablet Take 1 g by mouth 3 (three) times daily as needed. 04/14/19   [provider]    Allergies  Allergen Reactions  . Statins     Myalgias with all statins  . Amoxicillin Hives and Rash  . Penicillins Hives, Other (See Comments) and Rash    Family History  Problem Relation Age of Onset  . Arthritis Mother   . Asthma Mother   . Diabetes Mother   . Cirrhosis Mother        died of liver nonalcoholic cirrhosis  . COPD Mother   . Heart disease Mother        CHF  . Hypertension Mother   . Kidney disease Mother   . Liver disease Mother   . Eating disorder Mother   . Obesity Mother   . Asthma Sister        doesnt know history well  . Drug abuse Sister   . Cancer Father        died of pancreatic cancer at age 92  . Alcohol abuse Father   . Depression Brother   . Alcohol abuse Brother   . Stroke Maternal Aunt        died age 95yo of CVA  . Other Brother        died of MVA  . Other Sister   . Drug abuse Sister        doesnt know history well  . Heart attack Maternal Grandfather 70    Social History Social History   Tobacco Use  . Smoking status: Former Smoker    Packs/day: 1.00    Years: 15.00    Pack years: 15.00    Types: Cigarettes    Quit date: 06/25/1991    Years since quitting: 28.2  . Smokeless tobacco: Never Used  Substance Use Topics  . Alcohol use: Yes     Alcohol/week: 0.0 standard drinks    Comment: wine every 6 months or so  . Drug use: No    Review of Systems Constitutional: Negative for fever. Cardiovascular: Negative for chest pain. Respiratory: Negative for shortness of breath.  Positive for cough. Gastrointestinal: Negative for abdominal  pain.  1 episode of vomiting. Musculoskeletal: Negative for musculoskeletal complaints Neurological: Negative for headache All other ROS negative  ____________________________________________   PHYSICAL EXAM:  VITAL SIGNS: ED Triage Vitals  Enc Vitals Group     BP 09/10/19 0638 (!) 153/86     Pulse Rate 09/10/19 0638 (!) 123     Resp 09/10/19 0638 20     Temp 09/10/19 0638 100.3 F (37.9 C)     Temp Source 09/10/19 0638 Oral     SpO2 09/10/19 0638 93 %     Weight 09/10/19 0639 180 lb (81.6 kg)     Height 09/10/19 0639 5\' 2"  (1.575 m)     Head Circumference --      Peak Flow --      Pain Score 09/10/19 0637 5     Pain Loc --      Pain Edu? --      Excl. in GC? --    Constitutional: Alert and oriented. Well appearing and in no distress. Eyes: Normal exam ENT      Head: Normocephalic and atraumatic.      Mouth/Throat: Mucous membranes are moist. Cardiovascular: Normal rate, regular rhythm.  Respiratory: Normal respiratory effort without tachypnea nor retractions. Breath sounds are clear Gastrointestinal: Soft and nontender. No distention.   Musculoskeletal: Nontender with normal range of motion in all extremities. Neurologic:  Normal speech and language. No gross focal neurologic deficits  Skin:  Skin is warm, dry and intact.  Psychiatric: Mood and affect are normal.   ____________________________________________    EKG  EKG viewed and interpreted by myself shows a normal sinus rhythm at 98 bpm with a narrow QRS, normal axis, normal intervals, no concerning ST changes noted.  ____________________________________________    RADIOLOGY  X-ray shows right-sided  pneumonia.  ____________________________________________   INITIAL IMPRESSION / ASSESSMENT AND PLAN / ED COURSE  Pertinent labs & imaging results that were available during my care of the patient were reviewed by me and considered in my medical decision making (see chart for details).   Patient presents to the emergency department for cough body aches chills, found to be febrile to 100.3 tachycardic to 112.  We will check labs, cultures, start broad-spectrum antibiotics.  We will check for Covid.  Patient got her vaccine 3 days ago.  Chest x-ray consistent with right-sided pneumonia.  We will continue antibiotics.  Given the patient's elevated lactate of 2.3 chest x-ray positive for pneumonia with tachycardia and fever as well as borderline hypoxia between 91 and 93% patient will be admitted to the hospital service for continued IV antibiotics.  Reassuringly normal white blood cell count.  Rapid Covid swab is negative.  We will admit to the hospital service for further treatment.  Shannon Mckenzie was evaluated in Emergency Department on 09/10/2019 for the symptoms described in the history of present illness. She was evaluated in the context of the global COVID-19 pandemic, which necessitated consideration that the patient might be at risk for infection with the SARS-CoV-2 virus that causes COVID-19. Institutional protocols and algorithms that pertain to the evaluation of patients at risk for COVID-19 are in a state of rapid change based on information released by regulatory bodies including the CDC and federal and state organizations. These policies and algorithms were followed during the patient's care in the ED.  ____________________________________________   FINAL CLINICAL IMPRESSION(S) / ED DIAGNOSES  Pneumonia   09/12/2019, MD 09/10/19 [phone]

## 2019-09-10 NOTE — ED Notes (Addendum)
Pt given warm blanket.

## 2019-09-10 NOTE — Consult Note (Addendum)
PHARMACY -  BRIEF ANTIBIOTIC NOTE   Pharmacy has received consult(s) for Levofloxacin from an ED provider.  The patient's profile has been reviewed for ht/wt/allergies/indication/available labs.    PCN allergy - per patient she had a rash when she was in her 30s or 30s that required her to see a dermatologist. She denies total body rash. She does not recall a hospitalization related to this allergic reaction event. Patient denies having cephalosporins.   One time order(s) placed for levofloxacin 750 mg x1 in the ED that has not been given. However, given patient's reported allergic reaction was mild in nature, will change to azithromycin and ceftriaxone x1 dose (as per protocol).   Update @ 1255: Per ED nurse, patient had ceftriaxone and then azithromycin. While the patient was getting azithromycin, it was noted that the patient had redness and itching.   Further antibiotics/pharmacy consults should be ordered by admitting physician if indicated.                       Thank you, Katha Cabal 09/10/2019  7:59 AM

## 2019-09-10 NOTE — H&P (Addendum)
History and Physical    Shannon Mckenzie OEU:[id number] DOB: 05-14-1961 DOA: 09/10/2019  PCP: Lesleigh Noe, MD   Patient coming from: Home  I have personally briefly reviewed patient's old medical records in [place]  Chief Complaint: Fever  HPI: Shannon Mckenzie is a 59 y.o. female with medical history significant for anxiety, history of severe GERD from hiatal hernia, asthma hypertension, anemia and dyslipidemia who presents to the emergency room for evaluation of fever, chills, myalgias and cough.  Patient states that she had a cough about a week ago which was productive of grey phlegm.  Her symptoms were associated with chills which resolved recurred in the early hours of the morning on the day of her admission.  Chills was associated with a low-grade fever but she denies having any cough shortness of breath, chest pain, abdominal pain, headache, dizziness, lightheadedness or any mental status changes.  Patient got her COVID-19 vaccine Wynetta Emery and [place])  4 days prior to presenting to the hospital.  Chest x-ray showed  airspace disease in the right lung. The left lung appears clear. Right-sided airspace disease with seen on  October 2020 chest CT but resolved on interval December 2020 radiograph. Normal heart size when accounting for mediastinal fat about the apex. There is a hiatal hernia with bulging of the lower posterior mediastinal silhouette.  ED Course: Patient presents to the emergency department for cough, body aches, chills, found to be febrile to 100.3 tachycardic to 112.  We will check for Covid.  Patient got her vaccine 3 days ago. Chest x-ray consistent with right-sided pneumonia. Given the patient's elevated lactate of 2.3 chest x-ray positive for pneumonia with tachycardia and fever as well as borderline hypoxia between 91 and 93% patient will be admitted to the hospital service for continued IV antibiotics.  Reassuringly normal white blood cell count.  Review of  Systems: As per HPI otherwise 10 point review of systems negative.    Past Medical History:  Diagnosis Date  . Allergy   . Anemia   . Anxiety   . Asthma   . Depression   . DJD (degenerative joint disease) of hip   . Former smoker   . GERD (gastroesophageal reflux disease)   . Hand pain, right   . Hiatal hernia   . History of kidney stones    history  . Hyperlipidemia   . Hypertension   . Kidney stone   . Osteoarthritis   . Pain, hip    right  . Pneumonia    hx but no hospitalization  . Reactive airway disease   . RLS (restless legs syndrome)   . Routine gynecological examination    sees gynecology, [name] NP  . SOB (shortness of breath) 9/14   cardiac eval with stress test and echo; Dr. Tollie Eth  . Statin intolerance   . Vision problems    wears contact lenses    Past Surgical History:  Procedure Laterality Date  . bilateral hip replacement     nov 21st 2019 right, Jun 09 2018 left   . CARPAL TUNNEL RELEASE Left 07/03/2017   Procedure: LEFT CARPAL TUNNEL RELEASE;  Surgeon: Leanora Cover, MD;  Location: [facility];  Service: Orthopedics;  Laterality: Left;  . COLONOSCOPY  06/2012   Dr. Percell Miller; repeat 10 years  . UPPER GI ENDOSCOPY N/A 07/24/2012   Hiatus Hernia,normal stomach, normal examined duodenum  . WISDOM TOOTH EXTRACTION       reports that she  quit smoking about 28 years ago. Her smoking use included cigarettes. She has a 15.00 pack-year smoking history. She has never used smokeless tobacco. She reports current alcohol use. She reports that she does not use drugs.  Allergies  Allergen Reactions  . Statins     Myalgias with all statins  . Amoxicillin Hives and Rash  . Penicillins Hives, Other (See Comments) and Rash    Family History  Problem Relation Age of Onset  . Arthritis Mother   . Asthma Mother   . Diabetes Mother   . Cirrhosis Mother        died of liver nonalcoholic cirrhosis  . COPD Mother   . Heart disease Mother          CHF  . Hypertension Mother   . Kidney disease Mother   . Liver disease Mother   . Eating disorder Mother   . Obesity Mother   . Asthma Sister        doesnt know history well  . Drug abuse Sister   . Cancer Father        died of pancreatic cancer at age 25  . Alcohol abuse Father   . Depression Brother   . Alcohol abuse Brother   . Stroke Maternal Aunt        died age 67yo of CVA  . Other Brother        died of MVA  . Other Sister   . Drug abuse Sister        doesnt know history well  . Heart attack Maternal Grandfather 70     Prior to Admission medications   Medication Sig Start Date End Date Taking? Authorizing Provider  acetaminophen (TYLENOL) 325 MG tablet Take 650 mg by mouth every 8 (eight) hours as needed.   Yes [provider]  albuterol (PROVENTIL HFA;VENTOLIN HFA) 108 (90 Base) MCG/ACT inhaler Inhale 2 puffs into the lungs every 6 (six) hours as needed for wheezing or shortness of breath. 04/29/18  Yes [name], PA  albuterol (PROVENTIL) (2.5 MG/3ML) 0.083% nebulizer solution Take 3 mLs (2.5 mg total) by nebulization every 6 (six) hours as needed for wheezing or shortness of breath. 11/12/18  Yes [name], MD  BREO ELLIPTA 100-25 MCG/INH AEPB INHALE 1 PUFF INTO THE LUNGS DAILY 06/02/19  Yes [name], MD  cholecalciferol (VITAMIN D3) 25 MCG (1000 UT) tablet Take 1,000 Units by mouth daily.   Yes [provider]  escitalopram (LEXAPRO) 20 MG tablet TAKE 1 TABLET BY MOUTH EVERY DAY 05/11/19  Yes [name], MD  esomeprazole (NEXIUM) 40 MG capsule Take 1 capsule (40 mg total) by mouth 2 (two) times daily. 03/05/19  Yes [name], MD  ezetimibe (ZETIA) 10 MG tablet TAKE 1 TABLET BY MOUTH EVERY DAY 07/07/19  Yes [name], MD  ferrous sulfate 325 (65 FE) MG tablet Take 325 mg by mouth daily with breakfast.   Yes [provider]  fluticasone (FLONASE) 50 MCG/ACT nasal spray SHAKE LIQUID AND USE 1 SPRAY IN EACH  NOSTRIL DAILY 06/04/19  Yes [name], MD  losartan (COZAAR) 50 MG tablet TAKE 1 TABLET(50 MG) BY MOUTH DAILY 04/15/19  Yes [name], MD  montelukast (SINGULAIR) 10 MG tablet Take 1 tablet (10 mg total) by mouth at bedtime. 01/01/19  Yes [name], MD  Multiple Vitamin (MULTIVITAMIN WITH MINERALS) TABS tablet Take 1 tablet by mouth daily.   Yes [provider]  sucralfate (CARAFATE) 1 g tablet Take 1 g by mouth in the morning and at bedtime.  04/14/19  Yes [provider]    Physical Exam: Vitals:   09/10/19 0745 09/10/19 0800 09/10/19 0815 09/10/19 0830  BP:    135/69  Pulse: (!) 102 98 (!) 101 98  Resp:    (!) 22  Temp:      TempSrc:      SpO2: 94% 90% 94% 95%  Weight:      Height:         Vitals:   09/10/19 0745 09/10/19 0800 09/10/19 0815 09/10/19 0830  BP:    135/69  Pulse: (!) 102 98 (!) 101 98  Resp:    (!) 22  Temp:      TempSrc:      SpO2: 94% 90% 94% 95%  Weight:      Height:        Constitutional: NAD, alert and oriented x 3 Eyes: PERRL, lids and conjunctivae normal ENMT: Mucous membranes are moist.  Neck: normal, supple, no masses, no thyromegaly Respiratory: clear to auscultation on the left, no wheezing, no crackles. Normal respiratory effort. No accessory muscle use. Scattered rhonchi right base Cardiovascular: Regular rate and rhythm, no murmurs / rubs / gallops. No extremity edema. 2+ pedal pulses. No carotid bruits.  Abdomen: no tenderness, no masses palpated. No hepatosplenomegaly. Bowel sounds positive.  Musculoskeletal: no clubbing / cyanosis. No joint deformity upper and lower extremities.  Skin: no rashes, lesions, ulcers.  Neurologic: No gross focal neurologic deficit. Psychiatric: Normal mood and affect.   Labs on Admission: I have personally reviewed following labs and imaging studies  CBC: Recent Labs  Lab 09/10/19 0654  WBC 9.8  NEUTROABS 8.6*  HGB 13.3  HCT 39.7  MCV 84.3  PLT 153   Basic  Metabolic Panel: Recent Labs  Lab 09/10/19 0654  NA 141  K 3.9  CL 107  CO2 23  GLUCOSE 107*  BUN 19  CREATININE 0.83  CALCIUM 9.0   GFR: Estimated Creatinine Clearance: 74 mL/min (by C-G formula based on SCr of 0.83 mg/dL). Liver Function Tests: Recent Labs  Lab 09/10/19 0654  AST 22  ALT 17  ALKPHOS 86  BILITOT 0.6  PROT 6.9  ALBUMIN 4.0   No results for input(s): LIPASE, AMYLASE in the last 168 hours. No results for input(s): AMMONIA in the last 168 hours. Coagulation Profile: No results for input(s): INR, PROTIME in the last 168 hours. Cardiac Enzymes: No results for input(s): CKTOTAL, CKMB, CKMBINDEX, TROPONINI in the last 168 hours. BNP (last 3 results) No results for input(s): PROBNP in the last 8760 hours. HbA1C: No results for input(s): HGBA1C in the last 72 hours. CBG: No results for input(s): GLUCAP in the last 168 hours. Lipid Profile: No results for input(s): CHOL, HDL, LDLCALC, TRIG, CHOLHDL, LDLDIRECT in the last 72 hours. Thyroid Function Tests: No results for input(s): TSH, T4TOTAL, FREET4, T3FREE, THYROIDAB in the last 72 hours. Anemia Panel: No results for input(s): VITAMINB12, FOLATE, FERRITIN, TIBC, IRON, RETICCTPCT in the last 72 hours. Urine analysis:    Component Value Date/Time   COLORURINE YELLOW (A) 09/10/2019 0654   APPEARANCEUR CLEAR (A) 09/10/2019 0654   LABSPEC 1.018 09/10/2019 0654   PHURINE 5.0 09/10/2019 0654   GLUCOSEU NEGATIVE 09/10/2019 0654   HGBUR NEGATIVE 09/10/2019 0654   BILIRUBINUR NEGATIVE 09/10/2019 0654   BILIRUBINUR Negative 07/14/2017 1019   KETONESUR NEGATIVE 09/10/2019 0654   PROTEINUR NEGATIVE 09/10/2019 0654  UROBILINOGEN 0.2 07/14/2017 1019   UROBILINOGEN 0.2 11/09/2014 2200   NITRITE NEGATIVE 09/10/2019 0654   LEUKOCYTESUR NEGATIVE 09/10/2019 0654    Radiological Exams on Admission: DG Chest Portable 1 View  Result Date: 09/10/2019 CLINICAL DATA:  Fever EXAM: PORTABLE CHEST 1 VIEW COMPARISON:   05/25/2019 FINDINGS: Airspace disease in the right lung. The left lung appears clear. Right-sided airspace disease with seen on and October 2020 chest CT but resolved on interval December 2020 radiograph. Normal heart size when accounting for mediastinal fat about the apex. There is a hiatal hernia with bulging of the lower posterior mediastinal silhouette. IMPRESSION: Right-sided pneumonia. Electronically Signed   By: Marnee Spring M.D.   On: 09/10/2019 07:07    EKG: Independently reviewed.  Sinus rhythm  Assessment/Plan Principal Problem:   Aspiration pneumonia (HCC) Active Problems:   Essential hypertension, benign   Extrinsic asthma, mild persistent, uncomplicated   Anemia   Hiatal hernia with GERD without esophagitis   Depression     Aspiration pneumonia ([place]) Patient presents for evaluation of chills, low-grade fever and a productive cough about 7 days prior to her admission. Lactate was elevated at 2.3 She has a history of GERD from a hiatal hernia and has had prior hospitalizations in the past for right lower lobe pneumonia  Chest x-ray on admission showed airspace disease in the right lung. The left lung appears clear. Right-sided airspace disease with seen on  October 2020 chest CT but resolved on interval December 2020 radiograph.  Patient will be placed on Levaquin 750mg  IV daily due to allergies to cephalosporin and penicillin Follow-up results of blood cultures Strict aspiration precaution    Hypertension Blood pressure is stable Continue Losartan   Depression Stable Continue Lexapro    History of Asthma Not in acute exacerbation Continue PRN bronchodilator therapy and inhaled steroids    Anemia Hemoglobin is stable Continue Iron supplementation    Hiatal hernia with GERD Continue oral PPI   DVT prophylaxis:  Lovenox Code Status: Full Code Family Communication: Plan of care was discussed with patient in detail. She verbalizes  understanding and agrees with the plan Disposition Plan: Back to previous home environment Consults called: None    Shannon Dudzinski MD [facility]     09/10/2019, 10:01 AM

## 2019-09-10 NOTE — ED Notes (Signed)
Delay in medications due to Pharmacy changing medications after Levaquin was primed and on pump.

## 2019-09-10 NOTE — ED Triage Notes (Addendum)
Patient ambulatory to triage with steady gait, without difficulty or distress noted, mask in place; pt reports "my body is drawing up and I sleep all day; it's been going on for awhile but nobody can figure out why"; pt st since 4am she has had chills, cough, body aches, V x 1 and incontinent of urine several times because "couldn't get OOB"

## 2019-09-10 NOTE — ED Notes (Signed)
Patient c/o nausea shortly after Levaquin was started. Patient then had to go to hallway bathroom and had "watery" diarrhea. Patient agreed to have Levaquin restarted. Call light at bedside. Patient's door is slightly open. Patient was not put back on b/p cuff, pulse ox or monitor at this time.

## 2019-09-10 NOTE — ED Notes (Signed)
Lab to add on blood to new tests placed

## 2019-09-10 NOTE — ED Notes (Signed)
Report given to inpatient RN, transportation requested.  

## 2019-09-11 DIAGNOSIS — J45902 Unspecified asthma with status asthmaticus: Secondary | ICD-10-CM | POA: Diagnosis not present

## 2019-09-11 DIAGNOSIS — I10 Essential (primary) hypertension: Secondary | ICD-10-CM | POA: Diagnosis not present

## 2019-09-11 DIAGNOSIS — K449 Diaphragmatic hernia without obstruction or gangrene: Secondary | ICD-10-CM | POA: Diagnosis not present

## 2019-09-11 DIAGNOSIS — J690 Pneumonitis due to inhalation of food and vomit: Secondary | ICD-10-CM | POA: Diagnosis not present

## 2019-09-11 LAB — CBC
HCT: 33.1 % — ABNORMAL LOW (ref 36.0–46.0)
Hemoglobin: 10.8 g/dL — ABNORMAL LOW (ref 12.0–15.0)
MCH: 28.2 pg (ref 26.0–34.0)
MCHC: 32.6 g/dL (ref 30.0–36.0)
MCV: 86.4 fL (ref 80.0–100.0)
Platelets: 148 10*3/uL — ABNORMAL LOW (ref 150–400)
RBC: 3.83 MIL/uL — ABNORMAL LOW (ref 3.87–5.11)
RDW: 15.1 % (ref 11.5–15.5)
WBC: 10.2 10*3/uL (ref 4.0–10.5)
nRBC: 0 % (ref 0.0–0.2)

## 2019-09-11 LAB — URINE CULTURE: Culture: <10000 — AB

## 2019-09-11 LAB — HIV ANTIBODY (ROUTINE TESTING W REFLEX): HIV Screen 4th Generation wRfx: NONREACTIVE

## 2019-09-11 LAB — BASIC METABOLIC PANEL
Anion gap: 5 (ref 5–15)
BUN: 14 mg/dL (ref 6–20)
CO2: 26 mmol/L (ref 22–32)
Calcium: 8.3 mg/dL — ABNORMAL LOW (ref 8.9–10.3)
Chloride: 112 mmol/L — ABNORMAL HIGH (ref 98–111)
Creatinine, Ser: 0.61 mg/dL (ref 0.44–1.00)
GFR calc Af Amer: >60 mL/min (ref >60)
GFR calc non Af Amer: >60 mL/min (ref >60)
Glucose, Bld: 98 mg/dL (ref 70–99)
Potassium: 3.7 mmol/L (ref 3.5–5.1)
Sodium: 143 mmol/L (ref 135–145)

## 2019-09-11 MED ORDER — LEVOFLOXACIN 750 MG PO TABS: 750.0000 mg | ORAL_TABLET | Freq: Every day | ORAL | 0 refills | Status: DC

## 2019-09-11 NOTE — Discharge Summary (Signed)
[facility] [place] NAME: Shannon Mckenzie    MR#:  [id number]  DATE OF BIRTH:  1961-05-04  DATE OF ADMISSION:  09/10/2019 ADMITTING PHYSICIAN: Loletha Grayer, MD  DATE OF DISCHARGE: 09/11/2019  3:44 PM  PRIMARY CARE PHYSICIAN: Lesleigh Noe, MD    ADMISSION DIAGNOSIS:  CAP (community acquired pneumonia) [J18.9] Community acquired pneumonia of right lung, unspecified part of lung [J18.9] Aspiration pneumonia ([place]) [J69.0]  DISCHARGE DIAGNOSIS:  Principal Problem:   Aspiration pneumonia ([place]) Active Problems:   Essential hypertension, benign   Extrinsic asthma, mild persistent, uncomplicated   Anemia   Hiatal hernia with GERD without esophagitis   Depression   SECONDARY DIAGNOSIS:   Past Medical History:  Diagnosis Date  . Allergy   . Anemia   . Anxiety   . Asthma   . Depression   . DJD (degenerative joint disease) of hip   . Former smoker   . GERD (gastroesophageal reflux disease)   . Hand pain, right   . Hiatal hernia   . History of kidney stones    history  . Hyperlipidemia   . Hypertension   . Kidney stone   . Osteoarthritis   . Pain, hip    right  . Pneumonia    hx but no hospitalization  . Reactive airway disease   . RLS (restless legs syndrome)   . Routine gynecological examination    sees gynecology, [name] NP  . SOB (shortness of breath) 9/14   cardiac eval with stress test and echo; Dr. Tollie Eth  . Statin intolerance   . Vision problems    wears contact lenses    HOSPITAL COURSE:   1.  Aspiration pneumonia which is recurrent for this patient.  She has a history of hiatal hernia that is going to be repaired in April.  The patient had a shaking chill.  So far blood cultures are negative.  Patient afebrile.  White count normal. Patient feels well and wants to go home.  She received 1 dose of IV Levaquin yesterday.  Patient wanted to go home and finish up the course I switched over to oral Levaquin  for 4 more days.  Patient states that she does not have an issue swallowing. 2.  Hiatal hernia and GERD.  Continue Carafate and PPI 3.  Essential hypertension on Cozaar 4.  Asthma and reactive airway disease.  Continue inhalers nebulizer treatments and Singulair.  Lungs are clear upon discharge.  DISCHARGE CONDITIONS:   Satisfactory  CONSULTS OBTAINED:  None  DRUG ALLERGIES:   Allergies  Allergen Reactions  . Statins     Myalgias with all statins  . Amoxicillin Hives and Rash  . Azithromycin Rash  . Penicillins Hives, Other (See Comments) and Rash    DISCHARGE MEDICATIONS:   Allergies as of 09/11/2019      Reactions   Statins    Myalgias with all statins   Amoxicillin Hives, Rash   Azithromycin Rash   Penicillins Hives, Other (See Comments), Rash      Medication List    TAKE these medications   acetaminophen 325 MG tablet Commonly known as: TYLENOL Take 650 mg by mouth every 8 (eight) hours as needed.   albuterol 108 (90 Base) MCG/ACT inhaler Commonly known as: VENTOLIN HFA Inhale 2 puffs into the lungs every 6 (six) hours as needed for wheezing or shortness of breath.   albuterol (2.5 MG/3ML) 0.083% nebulizer solution Commonly known as: PROVENTIL Take  3 mLs (2.5 mg total) by nebulization every 6 (six) hours as needed for wheezing or shortness of breath.   Breo Ellipta 100-25 MCG/INH Aepb Generic drug: fluticasone furoate-vilanterol INHALE 1 PUFF INTO THE LUNGS DAILY   cholecalciferol 25 MCG (1000 UNIT) tablet Commonly known as: VITAMIN D3 Take 1,000 Units by mouth daily.   escitalopram 20 MG tablet Commonly known as: LEXAPRO TAKE 1 TABLET BY MOUTH EVERY DAY   esomeprazole 40 MG capsule Commonly known as: NEXIUM Take 1 capsule (40 mg total) by mouth 2 (two) times daily.   ezetimibe 10 MG tablet Commonly known as: ZETIA TAKE 1 TABLET BY MOUTH EVERY DAY   ferrous sulfate 325 (65 FE) MG tablet Take 325 mg by mouth daily with breakfast.   fluticasone  50 MCG/ACT nasal spray Commonly known as: FLONASE SHAKE LIQUID AND USE 1 SPRAY IN EACH NOSTRIL DAILY   levofloxacin 750 MG tablet Commonly known as: Levaquin Take 1 tablet (750 mg total) by mouth daily.   losartan 50 MG tablet Commonly known as: COZAAR TAKE 1 TABLET(50 MG) BY MOUTH DAILY   montelukast 10 MG tablet Commonly known as: SINGULAIR Take 1 tablet (10 mg total) by mouth at bedtime.   multivitamin with minerals Tabs tablet Take 1 tablet by mouth daily.   sucralfate 1 g tablet Commonly known as: CARAFATE Take 1 g by mouth in the morning and at bedtime.        DISCHARGE INSTRUCTIONS:   Follow-up PMD 5 days  If you experience worsening of your admission symptoms, develop shortness of breath, life threatening emergency, suicidal or homicidal thoughts you must seek medical attention immediately by calling 911 or calling your MD immediately  if symptoms less severe.  You Must read complete instructions/literature along with all the possible adverse reactions/side effects for all the Medicines you take and that have been prescribed to you. Take any new Medicines after you have completely understood and accept all the possible adverse reactions/side effects.   Please note  You were cared for by a hospitalist during your hospital stay. If you have any questions about your discharge medications or the care you received while you were in the hospital after you are discharged, you can call the unit and asked to speak with the hospitalist on call if the hospitalist that took care of you is not available. Once you are discharged, your primary care physician will handle any further medical issues. Please note that NO REFILLS for any discharge medications will be authorized once you are discharged, as it is imperative that you return to your primary care physician (or establish a relationship with a primary care physician if you do not have one) for your aftercare needs so that they can  reassess your need for medications and monitor your lab values.    Today   CHIEF COMPLAINT:   Chief Complaint  Patient presents with  . Fever    HISTORY OF PRESENT ILLNESS:  Shannon Mckenzie  is a 59 y.o. female with a known history of pneumonia presented with a shaking chill.   VITAL SIGNS:  Blood pressure 120/71, pulse 68, temperature 98 F (36.7 C), resp. rate 20, height 5\' 2"  (1.575 m), weight 85.8 kg, last menstrual period 03/13/2015, SpO2 96 %.  I/O:    Intake/Output Summary (Last 24 hours) at 09/11/2019 1637 Last data filed at 09/11/2019 0600 Gross per 24 hour  Intake 2339.46 ml  Output 600 ml  Net 1739.46 ml    PHYSICAL EXAMINATION:  GENERAL:  59 y.o.-year-old patient lying in the bed with no acute distress.  EYES: Pupils equal, round, reactive to light and accommodation. No scleral icterus. HEENT: Head atraumatic, normocephalic. Oropharynx and nasopharynx clear.  LUNGS: Normal breath sounds bilaterally, no wheezing, rales,rhonchi or crepitation. No use of accessory muscles of respiration.  CARDIOVASCULAR: S1, S2 normal. No murmurs, rubs, or gallops.  ABDOMEN: Soft, non-tender, non-distended. Bowel sounds present. No organomegaly or mass.  EXTREMITIES: No pedal edema, cyanosis, or clubbing.  NEUROLOGIC: Cranial nerves II through XII are intact. Muscle strength 5/5 in all extremities. Sensation intact. Gait not checked.  PSYCHIATRIC: The patient is alert and oriented x 3.  SKIN: No obvious rash, lesion, or ulcer.   DATA REVIEW:   CBC Recent Labs  Lab 09/11/19 0611  WBC 10.2  HGB 10.8*  HCT 33.1*  PLT 148*    Chemistries  Recent Labs  Lab 09/10/19 0654 09/10/19 0654 09/11/19 0611  NA 141   < > 143  K 3.9   < > 3.7  CL 107   < > 112*  CO2 23   < > 26  GLUCOSE 107*   < > 98  BUN 19   < > 14  CREATININE 0.83   < > 0.61  CALCIUM 9.0   < > 8.3*  AST 22  --   --   ALT 17  --   --   ALKPHOS 86  --   --   BILITOT 0.6  --   --    < > = values in this  interval not displayed.    Microbiology Results  Results for orders placed or performed during the hospital encounter of 09/10/19  Urine culture     Status: Abnormal   Collection Time: 09/10/19  6:54 AM   Specimen: In/Out Cath Urine  Result Value Ref Range Status   Specimen Description   Final    IN/OUT CATH URINE Performed at [facility], [address], [place], [place] [address]    Special Requests   Final    NONE Performed at [facility], [address], [place], [place] [address]    Culture (A)  Final    <10,000 COLONIES/mL INSIGNIFICANT GROWTH Performed at [facility], [address], [place], [place] [address]    Report Status 09/11/2019 FINAL  Final  Blood Culture (routine x 2)     Status: None (Preliminary result)   Collection Time: 09/10/19  7:54 AM   Specimen: BLOOD  Result Value Ref Range Status   Specimen Description BLOOD LEFT ANTECUBITAL  Final   Special Requests   Final    BOTTLES DRAWN AEROBIC AND ANAEROBIC Blood Culture adequate volume   Culture   Final    NO GROWTH < 24 HOURS Performed at [facility], [address], [place], [place] [address]    Report Status PENDING  Incomplete  Blood Culture (routine x 2)     Status: None (Preliminary result)   Collection Time: 09/10/19  7:55 AM   Specimen: BLOOD  Result Value Ref Range Status   Specimen Description BLOOD BLOOD LEFT FOREARM  Final   Special Requests   Final    BOTTLES DRAWN AEROBIC AND ANAEROBIC Blood Culture adequate volume   Culture   Final    NO GROWTH < 24 HOURS Performed at [facility], [address], [place], [place] [address]    Report Status PENDING  Incomplete  SARS CORONAVIRUS 2 (TAT 6-24 HRS)  Nasopharyngeal Nasopharyngeal Swab     Status: None   Collection Time: 09/10/19  9:08 AM   Specimen: Nasopharyngeal Swab  Result Value Ref Range Status   SARS Coronavirus 2 NEGATIVE NEGATIVE Final    Comment: (NOTE) SARS-CoV-2 target nucleic  acids are NOT DETECTED. The SARS-CoV-2 RNA is generally detectable in upper and lower respiratory specimens during the acute phase of infection. Negative results do not preclude SARS-CoV-2 infection, do not rule out co-infections with other pathogens, and should not be used as the sole basis for treatment or other patient management decisions. Negative results must be combined with clinical observations, patient history, and epidemiological information. The expected result is Negative. Fact Sheet for Patients: HairSlick.no Fact Sheet for Healthcare Providers: quierodirigir.com This test is not yet approved or cleared by the [place] FDA and  has been authorized for detection and/or diagnosis of SARS-CoV-2 by FDA under an Emergency Use Authorization (EUA). This EUA will remain  in effect (meaning this test can be used) for the duration of the COVID-19 declaration under Section 56 4(b)(1) of the Act, 21 U.S.C. section 360bbb-3(b)(1), unless the authorization is terminated or revoked sooner. Performed at [facility], [address], [place], [place] [address]     RADIOLOGY:  DG Chest Portable 1 View  Result Date: 09/10/2019 CLINICAL DATA:  Fever EXAM: PORTABLE CHEST 1 VIEW COMPARISON:  05/25/2019 FINDINGS: Airspace disease in the right lung. The left lung appears clear. Right-sided airspace disease with seen on and October 2020 chest CT but resolved on interval December 2020 radiograph. Normal heart size when accounting for mediastinal fat about the apex. There is a hiatal hernia with bulging of the lower posterior mediastinal silhouette. IMPRESSION: Right-sided pneumonia. Electronically Signed   By: Marnee Spring M.D.   On: 09/10/2019 07:07      Management plans discussed with the patient, and she is in agreement.  CODE STATUS:     Code Status Orders  (From admission, onward)         Start     Ordered    09/10/19 0912  Full code  Continuous     09/10/19 0914        Code Status History    This patient has a current code status but no historical code status.   Advance Care Planning Activity      TOTAL TIME TAKING CARE OF THIS PATIENT: 35 minutes.    Alford Highland M.D on 09/11/2019 at 4:37 PM  Between 7am to 6pm - Pager - [phone]  After 6pm go to [email or web address] - password [email or web address]  [facility]  CC: Primary care physician; [name], MD

## 2019-09-11 NOTE — Discharge Instructions (Signed)
Community-Acquired Pneumonia, Adult Pneumonia is an infection of the lungs. It causes swelling in the airways of the lungs. Mucus and fluid may also build up inside the airways. One type of pneumonia can happen while a person is in a hospital. A different type can happen when a person is not in a hospital (community-acquired pneumonia).  What are the causes?  This condition is caused by germs (viruses, bacteria, or fungi). Some types of germs can be passed from one person to another. This can happen when you breathe in droplets from the cough or sneeze of an infected person. What increases the risk? You are more likely to develop this condition if you:  Have a long-term (chronic) disease, such as: ? Chronic obstructive pulmonary disease (COPD). ? Asthma. ? Cystic fibrosis. ? Congestive heart failure. ? Diabetes. ? Kidney disease.  Have HIV.  Have sickle cell disease.  Have had your spleen removed.  Do not take good care of your teeth and mouth (poor dental hygiene).  Have a medical condition that increases the risk of breathing in droplets from your own mouth and nose.  Have a weakened body defense system (immune system).  Are a smoker.  Travel to areas where the germs that cause this illness are common.  Are around certain animals or the places they live. What are the signs or symptoms?  A dry cough.  A wet (productive) cough.  Fever.  Sweating.  Chest pain. This often happens when breathing deeply or coughing.  Fast breathing or trouble breathing.  Shortness of breath.  Shaking chills.  Feeling tired (fatigue).  Muscle aches. How is this treated? Treatment for this condition depends on many things. Most adults can be treated at home. In some cases, treatment must happen in a hospital. Treatment may include:  Medicines given by mouth or through an IV tube.  Being given extra oxygen.  Respiratory therapy. In rare cases, treatment for very bad pneumonia  may include:  Using a machine to help you breathe.  Having a procedure to remove fluid from around your lungs. Follow these instructions at home: Medicines  Take over-the-counter and prescription medicines only as told by your doctor. ? Only take cough medicine if you are losing sleep.  If you were prescribed an antibiotic medicine, take it as told by your doctor. Do not stop taking the antibiotic even if you start to feel better. General instructions   Sleep with your head and neck raised (elevated). You can do this by sleeping in a recliner or by putting a few pillows under your head.  Rest as needed. Get at least 8 hours of sleep each night.  Drink enough water to keep your pee (urine) pale yellow.  Eat a healthy diet that includes plenty of vegetables, fruits, whole grains, low-fat dairy products, and lean protein.  Do not use any products that contain nicotine or tobacco. These include cigarettes, e-cigarettes, and chewing tobacco. If you need help quitting, ask your doctor.  Keep all follow-up visits as told by your doctor. This is important. How is this prevented? A shot (vaccine) can help prevent pneumonia. Shots are often suggested for:  People older than 58 years of age.  People older than 58 years of age who: ? Are having cancer treatment. ? Have long-term (chronic) lung disease. ? Have problems with their body's defense system. You may also prevent pneumonia if you take these actions:  Get the flu (influenza) shot every year.  Go to the dentist as   often as told.  Wash your hands often. If you cannot use soap and water, use hand sanitizer. Contact a doctor if:  You have a fever.  You lose sleep because your cough medicine does not help. Get help right away if:  You are short of breath and it gets worse.  You have more chest pain.  Your sickness gets worse. This is very serious if: ? You are an older adult. ? Your body's defense system is weak.  You  cough up blood. Summary  Pneumonia is an infection of the lungs.  Most adults can be treated at home. Some will need treatment in a hospital.  Drink enough water to keep your pee pale yellow.  Get at least 8 hours of sleep each night. This information is not intended to replace advice given to you by your health care provider. Make sure you discuss any questions you have with your health care provider. Document Revised: 09/30/2018 Document Reviewed: 02/05/2018 Elsevier Patient Education  2020 Elsevier Inc.  

## 2019-09-11 NOTE — Progress Notes (Signed)
Shannon Mckenzie to be D/C'd Home per MD order.  Discussed prescriptions and follow up appointments with the patient. Prescriptions given to patient, medication list explained in detail. Pt verbalized understanding.  Allergies as of 09/11/2019      Reactions   Statins    Myalgias with all statins   Amoxicillin Hives, Rash   Azithromycin Rash   Penicillins Hives, Other (See Comments), Rash      Medication List    TAKE these medications   acetaminophen 325 MG tablet Commonly known as: TYLENOL Take 650 mg by mouth every 8 (eight) hours as needed.   albuterol 108 (90 Base) MCG/ACT inhaler Commonly known as: VENTOLIN HFA Inhale 2 puffs into the lungs every 6 (six) hours as needed for wheezing or shortness of breath.   albuterol (2.5 MG/3ML) 0.083% nebulizer solution Commonly known as: PROVENTIL Take 3 mLs (2.5 mg total) by nebulization every 6 (six) hours as needed for wheezing or shortness of breath.   Breo Ellipta 100-25 MCG/INH Aepb Generic drug: fluticasone furoate-vilanterol INHALE 1 PUFF INTO THE LUNGS DAILY   cholecalciferol 25 MCG (1000 UNIT) tablet Commonly known as: VITAMIN D3 Take 1,000 Units by mouth daily.   escitalopram 20 MG tablet Commonly known as: LEXAPRO TAKE 1 TABLET BY MOUTH EVERY DAY   esomeprazole 40 MG capsule Commonly known as: NEXIUM Take 1 capsule (40 mg total) by mouth 2 (two) times daily.   ezetimibe 10 MG tablet Commonly known as: ZETIA TAKE 1 TABLET BY MOUTH EVERY DAY   ferrous sulfate 325 (65 FE) MG tablet Take 325 mg by mouth daily with breakfast.   fluticasone 50 MCG/ACT nasal spray Commonly known as: FLONASE SHAKE LIQUID AND USE 1 SPRAY IN EACH NOSTRIL DAILY   levofloxacin 750 MG tablet Commonly known as: Levaquin Take 1 tablet (750 mg total) by mouth daily.   losartan 50 MG tablet Commonly known as: COZAAR TAKE 1 TABLET(50 MG) BY MOUTH DAILY   montelukast 10 MG tablet Commonly known as: SINGULAIR Take 1 tablet (10 mg total) by  mouth at bedtime.   multivitamin with minerals Tabs tablet Take 1 tablet by mouth daily.   sucralfate 1 g tablet Commonly known as: CARAFATE Take 1 g by mouth in the morning and at bedtime.       Vitals:   09/11/19 0518 09/11/19 0951  BP: 125/74 120/71  Pulse: 74 68  Resp: 20   Temp: 98 F (36.7 C)   SpO2: 97% 96%    Skin clean, dry and intact without evidence of skin break down, no evidence of skin tears noted. IV catheter discontinued intact. Site without signs and symptoms of complications. Dressing and pressure applied. Pt denies pain at this time. No complaints noted.  An After Visit Summary was printed and given to the patient. Patient escorted via WC, and D/C home via private auto.  Leonides Cave 09/11/2019 12:06 PM

## 2019-09-14 ENCOUNTER — Ambulatory Visit: Payer: BC Managed Care – PPO | Admitting: Family Medicine

## 2019-09-14 ENCOUNTER — Other Ambulatory Visit: Payer: Self-pay

## 2019-09-14 ENCOUNTER — Encounter: Payer: Self-pay | Admitting: Family Medicine

## 2019-09-14 VITALS — BP 126/88 | HR 73 | Temp 97.9°F | Ht 62.0 in | Wt 188.5 lb

## 2019-09-14 DIAGNOSIS — I10 Essential (primary) hypertension: Secondary | ICD-10-CM

## 2019-09-14 DIAGNOSIS — K219 Gastro-esophageal reflux disease without esophagitis: Secondary | ICD-10-CM

## 2019-09-14 DIAGNOSIS — J690 Pneumonitis due to inhalation of food and vomit: Secondary | ICD-10-CM

## 2019-09-14 DIAGNOSIS — K449 Diaphragmatic hernia without obstruction or gangrene: Secondary | ICD-10-CM

## 2019-09-14 NOTE — Addendum Note (Signed)
Addended by: Gweneth Dimitri R on: 09/14/2019 12:52 PM   Modules accepted: Level of Service

## 2019-09-14 NOTE — Progress Notes (Signed)
Subjective:     FRONNIE URTON is a 59 y.o. female presenting for Hospitalization Follow-up     HPI  #pneumonia - aspiration pneumonia - getting surgery on 4/9 - needs to make sure she is improved before her surgery - mild cough - was having rib pain on Saturday  - was having chills last week on Friday and then she went to the hospital - was weak/tired, sleepy - immediately had a CXR and was told she had pneumonia - one more day of antibiotic left - 5 day course  Is having rigors/chills - had 2 with this current infection  Reflux - avoiding eating before bed, taking several medication and will get frequent reflux  Last episode of pneumonia was in 03/2019 and noted to have hiatal hernia  Review of Systems   3/19-3/20/2021: Admission - Pneumonia - aspiration 2/2 to hiatal hernia - Levofloxacin and getting surgery in April   Social History   Tobacco Use  Smoking Status Former Smoker  . Packs/day: 1.00  . Years: 15.00  . Pack years: 15.00  . Types: Cigarettes  . Quit date: 06/25/1991  . Years since quitting: 28.2  Smokeless Tobacco Never Used        Objective:    BP Readings from Last 3 Encounters:  09/14/19 126/88  09/11/19 120/71  05/03/19 (!) 134/91   Wt Readings from Last 3 Encounters:  09/14/19 188 lb 8 oz (85.5 kg)  09/10/19 189 lb 2.5 oz (85.8 kg)  05/03/19 192 lb (87.1 kg)    BP 126/88   Pulse 73   Temp 97.9 F (36.6 C)   Ht 5\' 2"  (1.575 m)   Wt 188 lb 8 oz (85.5 kg)   LMP 03/13/2015   SpO2 97%   BMI 34.48 kg/m    Physical Exam Constitutional:      General: She is not in acute distress.    Appearance: She is well-developed. She is not diaphoretic.  HENT:     Right Ear: External ear normal.     Left Ear: External ear normal.     Nose: Nose normal.  Eyes:     Conjunctiva/sclera: Conjunctivae normal.  Cardiovascular:     Rate and Rhythm: Normal rate and regular rhythm.     Heart sounds: No murmur.  Pulmonary:     Effort:  Pulmonary effort is normal. No respiratory distress.     Breath sounds: Normal breath sounds. No wheezing.  Musculoskeletal:     Cervical back: Neck supple.  Skin:    General: Skin is warm and dry.     Capillary Refill: Capillary refill takes less than 2 seconds.  Neurological:     Mental Status: She is alert. Mental status is at baseline.  Psychiatric:        Mood and Affect: Mood normal.        Behavior: Behavior normal.           Assessment & Plan:   Problem List Items Addressed This Visit      Cardiovascular and Mediastinum   Essential hypertension, benign - Primary    BP at goal. Continue losartan.         Respiratory   Hiatal hernia with GERD without esophagitis    Leading to aspiration pneumonia. Has surgery in 2 weeks. Encourage moving forward but defer to surgeon given recent infection.       Aspiration pneumonia ([place])    Recovering well post hospital w/ no residual effects and normal lung  exam. Complete abx. Has already notified surgeon, though suspect she should be fine for surgery in 2 weeks.           Return if symptoms worsen or fail to improve.  [name], MD

## 2019-09-14 NOTE — Patient Instructions (Signed)
#   Pneumonia - I'm glad you are doing well - If you notice a return of the severe chills let me know  I still recommend calling your surgeon to update them. I suspect in 2 and 1/2 weeks you should be fine to get surgery but it is will be up to them

## 2019-09-14 NOTE — Assessment & Plan Note (Signed)
Leading to aspiration pneumonia. Has surgery in 2 weeks. Encourage moving forward but defer to surgeon given recent infection.

## 2019-09-14 NOTE — Assessment & Plan Note (Signed)
BP at goal Continue losartan.  

## 2019-09-14 NOTE — Assessment & Plan Note (Signed)
Recovering well post hospital w/ no residual effects and normal lung exam. Complete abx. Has already notified surgeon, though suspect she should be fine for surgery in 2 weeks.

## 2019-09-15 LAB — CULTURE, BLOOD (ROUTINE X 2)
Culture: NO GROWTH
Culture: NO GROWTH
Special Requests: ADEQUATE
Special Requests: ADEQUATE

## 2019-09-21 NOTE — Progress Notes (Signed)
Can you please place some orders for the upcomming surgery.Pt. PST appointment on 09/22/19.Thank you.

## 2019-09-21 NOTE — Patient Instructions (Addendum)
DUE TO COVID-19 ONLY ONE VISITOR IS ALLOWED TO COME WITH YOU AND STAY IN THE WAITING ROOM ONLY DURING PRE OP AND PROCEDURE DAY OF SURGERY. THE 1 VISITOR MAY VISIT WITH YOU AFTER SURGERY IN YOUR PRIVATE ROOM DURING VISITING HOURS ONLY!  YOU NEED TO HAVE A COVID 19 TEST ON: 09/28/19  @   9:30 am  , THIS TEST MUST BE DONE BEFORE SURGERY, COME  [address], [place] , [address].  [facility]) ONCE YOUR COVID TEST IS COMPLETED, PLEASE BEGIN THE QUARANTINE INSTRUCTIONS AS OUTLINED IN YOUR HANDOUT.                Shannon Mckenzie     Your procedure is scheduled on: 10/01/19   Report to [facility] Main  Entrance   Report to admitting at: 7:30 AM     Call this number if you have problems the morning of surgery [phone]    Remember: Do not eat food or drink liquids :After Midnight.   BRUSH YOUR TEETH MORNING OF SURGERY AND RINSE YOUR MOUTH OUT, NO CHEWING GUM CANDY OR MINTS.     Take these medicines the morning of surgery with A SIP OF WATER: escitalopram,esomeprazole.Use inhalers and flonase as usual.                                 You may not have any metal on your body including hair pins and              piercings  Do not wear jewelry, make-up, lotions, powders or perfumes, deodorant             Do not wear nail polish on your fingernails.  Do not shave  48 hours prior to surgery.                Do not bring valuables to the hospital. Shannon Mckenzie IS NOT             RESPONSIBLE   FOR VALUABLES.  Contacts, dentures or bridgework may not be worn into surgery.  Leave suitcase in the car. After surgery it may be brought to your room.     Patients discharged the day of surgery will not be allowed to drive home. IF YOU ARE HAVING SURGERY AND GOING HOME THE SAME DAY, YOU MUST HAVE AN ADULT TO DRIVE YOU HOME AND BE WITH YOU FOR 24 HOURS. YOU MAY GO HOME BY TAXI OR UBER OR ORTHERWISE, BUT AN ADULT MUST ACCOMPANY YOU HOME AND STAY WITH YOU FOR 24 HOURS.  Name and  phone number of your driver:  Special Instructions: N/A              Please read over the following fact sheets you were given: _____________________________________________________________________             [facility] - Preparing for Surgery Before surgery, you can play an important role.  Because skin is not sterile, your skin needs to be as free of germs as possible.  You can reduce the number of germs on your skin by washing with CHG (chlorahexidine gluconate) soap before surgery.  CHG is an antiseptic cleaner which kills germs and bonds with the skin to continue killing germs even after washing. Please DO NOT use if you have an allergy to CHG or antibacterial soaps.  If your skin becomes reddened/irritated stop using the CHG and inform  your nurse when you arrive at Short Stay. Do not shave (including legs and underarms) for at least 48 hours prior to the first CHG shower.  You may shave your face/neck. Please follow these instructions carefully:  1.  Shower with CHG Soap the night before surgery and the  morning of Surgery.  2.  If you choose to wash your hair, wash your hair first as usual with your  normal  shampoo.  3.  After you shampoo, rinse your hair and body thoroughly to remove the  shampoo.                           4.  Use CHG as you would any other liquid soap.  You can apply chg directly  to the skin and wash                       Gently with a scrungie or clean washcloth.  5.  Apply the CHG Soap to your body ONLY FROM THE NECK DOWN.   Do not use on face/ open                           Wound or open sores. Avoid contact with eyes, ears mouth and genitals (private parts).                       Wash face,  Genitals (private parts) with your normal soap.             6.  Wash thoroughly, paying special attention to the area where your surgery  will be performed.  7.  Thoroughly rinse your body with warm water from the neck down.  8.  DO NOT shower/wash with your normal soap  after using and rinsing off  the CHG Soap.                9.  Pat yourself dry with a clean towel.            10.  Wear clean pajamas.            11.  Place clean sheets on your bed the night of your first shower and do not  sleep with pets. Day of Surgery : Do not apply any lotions/deodorants the morning of surgery.  Please wear clean clothes to the hospital/surgery center.  FAILURE TO FOLLOW THESE INSTRUCTIONS MAY RESULT IN THE CANCELLATION OF YOUR SURGERY PATIENT SIGNATURE_________________________________  NURSE SIGNATURE__________________________________  ________________________________________________________________________

## 2019-09-22 ENCOUNTER — Other Ambulatory Visit: Payer: Self-pay

## 2019-09-22 ENCOUNTER — Encounter (HOSPITAL_COMMUNITY)
Admission: RE | Admit: 2019-09-22 | Discharge: 2019-09-22 | Disposition: A | Discharge status: Home / Self Care | Payer: BC Managed Care – PPO | Source: Ambulatory Visit | Attending: Surgery | Admitting: Surgery

## 2019-09-22 ENCOUNTER — Encounter (HOSPITAL_COMMUNITY): Payer: Self-pay

## 2019-09-22 DIAGNOSIS — Z01812 Encounter for preprocedural laboratory examination: Secondary | ICD-10-CM | POA: Insufficient documentation

## 2019-09-22 HISTORY — DX: Cardiac murmur, unspecified: R01.1

## 2019-09-22 LAB — BASIC METABOLIC PANEL
Anion gap: 8 (ref 5–15)
BUN: 16 mg/dL (ref 6–20)
CO2: 27 mmol/L (ref 22–32)
Calcium: 9.2 mg/dL (ref 8.9–10.3)
Chloride: 108 mmol/L (ref 98–111)
Creatinine, Ser: 0.74 mg/dL (ref 0.44–1.00)
GFR calc Af Amer: >60 mL/min (ref >60)
GFR calc non Af Amer: >60 mL/min (ref >60)
Glucose, Bld: 86 mg/dL (ref 70–99)
Potassium: 3.8 mmol/L (ref 3.5–5.1)
Sodium: 143 mmol/L (ref 135–145)

## 2019-09-22 LAB — CBC
HCT: 38.2 % (ref 36.0–46.0)
Hemoglobin: 12.6 g/dL (ref 12.0–15.0)
MCH: 29.1 pg (ref 26.0–34.0)
MCHC: 33 g/dL (ref 30.0–36.0)
MCV: 88.2 fL (ref 80.0–100.0)
Platelets: 203 10*3/uL (ref 150–400)
RBC: 4.33 MIL/uL (ref 3.87–5.11)
RDW: 14.6 % (ref 11.5–15.5)
WBC: 5.1 10*3/uL (ref 4.0–10.5)
nRBC: 0 % (ref 0.0–0.2)

## 2019-09-22 NOTE — Progress Notes (Signed)
PCP - DR. Shanda Bumps CODY. Lov: 09/14/19. :CLEARANCE. EPIC Cardiologist -   Chest x-ray - 09/10/19. epic EKG - 09/13/19 epic Stress Test -  ECHO - 08/19/18 epic Cardiac Cath -   Sleep Study -  CPAP -   Fasting Blood Sugar -  Checks Blood Sugar _____ times a day  Blood Thinner Instructions: Aspirin Instructions: Last Dose:  Anesthesia review:   Patient denies shortness of breath, fever, cough and chest pain at PAT appointment   Patient verbalized understanding of instructions that were given to them at the PAT appointment. Patient was also instructed that they will need to review over the PAT instructions again at home before surgery.

## 2019-09-28 ENCOUNTER — Other Ambulatory Visit (HOSPITAL_COMMUNITY)
Admission: RE | Admit: 2019-09-28 | Discharge: 2019-09-28 | Disposition: A | Discharge status: Home / Self Care | Payer: BC Managed Care – PPO | Source: Ambulatory Visit | Attending: Surgery | Admitting: Surgery

## 2019-09-28 ENCOUNTER — Other Ambulatory Visit (HOSPITAL_COMMUNITY): Payer: BC Managed Care – PPO

## 2019-09-28 DIAGNOSIS — Z20822 Contact with and (suspected) exposure to covid-19: Secondary | ICD-10-CM | POA: Insufficient documentation

## 2019-09-28 DIAGNOSIS — Z01812 Encounter for preprocedural laboratory examination: Secondary | ICD-10-CM | POA: Diagnosis not present

## 2019-09-28 LAB — SARS CORONAVIRUS 2 (TAT 6-24 HRS): SARS Coronavirus 2: NEGATIVE

## 2019-09-29 NOTE — H&P (Signed)
Shannon Mckenzie Documented: 07/21/2019 9:38 AM Location: [place] Office Patient #: [id number] DOB: 03-24-1961 Married / Language: Shannon Mckenzie / Race: White Female   History of Present Illness Rodman Key B. Hassell Done MD; 07/21/2019 10:26 AM) The patient is a 59 year old female who presents for evaluation of reflux esophagitis. 59 year old WF who lives closer to [place], [place] has been followed for years with GER. She has had anemia for many years. In December, she was diagnosed with pneumonia at [facility] (aspiration?) and was coughing a lot. She now has evidence by Dr. 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 She has been on twice daily Nexium and now is experiencing relief with Surcrafate tablets.   I reviewed her notes from Dr. Therisa Doyne and discussed hiatal hernia repair including Nissen fundoplication with her in some detail including my drawings which I gave to her. She had good questions which I answered.   I think that she is a good candidate for lap hiatal hernia repair and Nissen fundoplication. I gave her a Krames booklet. Recommend continue PPI and sucrafate and schedule for Nissen fundoplication as elective surgery reopens. She had pneumonia and was hospitalized at [facility] ~ 3 weeks ago.     Past Surgical History Shannon Mckenzie, CMA; 07/21/2019 9:40 AM) Colon Polyp Removal - Colonoscopy  Hip Surgery  Bilateral. Oral Surgery   Diagnostic Studies History Shannon Mckenzie, CMA; 07/21/2019 9:40 AM) Colonoscopy  within last year Mammogram  within last year Pap Smear  1-5 years ago  Allergies Shannon Mckenzie, CMA; 07/21/2019 9:42 AM) Statins  Amoxicillin *PENICILLINS*  Penicillins   Medication History Shannon Mckenzie, CMA; 07/21/2019 9:44 AM) Adair Patter (100-25MCG/INH Aero Pow Br Act, Inhalation) Active. Fluticasone Propionate (50MCG/ACT Suspension, Nasal) Active. Sucralfate (1GM Tablet, Oral)  Active. Escitalopram Oxalate (20MG  Tablet, Oral) Active. Montelukast Sodium (10MG  Tablet, Oral) Active. Ezetimibe (10MG  Tablet, Oral) Active. Losartan Potassium (50MG  Tablet, Oral) Active. Esomeprazole Magnesium (40MG  Capsule DR, Oral) Active. Iron (65MG  Tablet, Oral) Active. Vitamin D3 (25 MCG(1000 UT) Capsule, Oral) Active. Multi-Day Plus Minerals (Oral) Active. Vitamin C (500MG  Capsule, Oral) Active. Medications Reconciled  Social History Shannon Mckenzie, CMA; 07/21/2019 9:40 AM) Caffeine use  Carbonated beverages. Tobacco use  Former smoker.  Family History Shannon Mckenzie, CMA; 07/21/2019 9:40 AM) Alcohol Abuse  Father. Arthritis  Mother. Diabetes Mellitus  Mother. Heart Disease  Mother. Heart disease in female family member before age 13  Hypertension  Mother. Malignant Neoplasm Of Pancreas  Father. Respiratory Condition  Mother.  Pregnancy / Birth History Shannon Mckenzie, CMA; 07/21/2019 9:40 AM) Age at menarche  66 years. Age of menopause  26-50 Gravida  1 Maternal age  52-25 Para  1  Other Problems Shannon Mckenzie, CMA; 07/21/2019 9:40 AM) Arthritis  Asthma  Back Pain  Bladder Problems  Depression  Gastroesophageal Reflux Disease  Heart murmur  High blood pressure  Hypercholesterolemia     Review of Systems (Chemira Jones CMA; 07/21/2019 9:40 AM) General Not Present- Appetite Loss, Chills, Fatigue, Fever, Night Sweats, Weight Gain and Weight Loss. Skin Present- Change in Wart/Mole and Dryness. Not Present- Hives, Jaundice, New Lesions, Non-Healing Wounds, Rash and Ulcer. HEENT Present- Wears glasses/contact lenses. Not Present- Earache, Hearing Loss, Hoarseness, Nose Bleed, Oral Ulcers, Ringing in the Ears, Seasonal Allergies, Sinus Pain, Sore Throat, Visual Disturbances and Yellow Eyes. Breast Not Present- Breast Mass, Breast Pain, Nipple Discharge and Skin Changes. Cardiovascular Not Present- Chest Pain, Difficulty Breathing Lying  Down, Leg  Cramps, Palpitations, Rapid Heart Rate, Shortness of Breath and Swelling of Extremities. Gastrointestinal Present- Change in Bowel Habits and Indigestion. Not Present- Abdominal Pain, Bloating, Bloody Stool, Chronic diarrhea, Constipation, Difficulty Swallowing, Excessive gas, Gets full quickly at meals, Hemorrhoids, Nausea, Rectal Pain and Vomiting. Female Genitourinary Present- Urgency. Not Present- Frequency, Nocturia, Painful Urination and Pelvic Pain. Musculoskeletal Present- Joint Pain, Joint Stiffness and Muscle Weakness. Not Present- Back Pain, Muscle Pain and Swelling of Extremities. Neurological Not Present- Decreased Memory, Fainting, Headaches, Numbness, Seizures, Tingling, Tremor, Trouble walking and Weakness. Psychiatric Present- Depression. Not Present- Anxiety, Bipolar, Change in Sleep Pattern, Fearful and Frequent crying. Endocrine Not Present- Cold Intolerance, Excessive Hunger, Hair Changes, Heat Intolerance, Hot flashes and New Diabetes. Hematology Present- Easy Bruising. Not Present- Blood Thinners, Excessive bleeding, Gland problems, HIV and Persistent Infections.  Vitals (Chemira Jones CMA; 07/21/2019 9:41 AM) 07/21/2019 9:41 AM Weight: 192.8 lb Height: 62in Body Surface Area: 1.88 m Body Mass Index: 35.26 kg/m  Pulse: 74 (Regular)  BP: 150/92 (Sitting, Left Arm, Standard)       Physical Exam (Feleica Fulmore B. Daphine Deutscher MD; 07/21/2019 10:28 AM) General Note: Overweigh WF NAD HEENT unremarkable Neck without bruits Chest clear to auscutation Heart SR without murmurs Abdomen no prior abdominal surgery Ext -bilateral hip replacement by Howell Rucks in 2019     Assessment & Plan Molli Hazard B. Daphine Deutscher MD; 07/21/2019 10:29 AM) Shannon Mckenzie ESOPHAGITIS (K22.10) Impression: Will schedule elective Nissen and hiatal hernia repair. I think that she is a good candidate and will proceed at [facility]

## 2019-09-30 MED ORDER — BUPIVACAINE LIPOSOME 1.3 % IJ SUSP
20.0000 mL | Freq: Once | INTRAMUSCULAR | Status: DC
  Filled 2019-09-30: qty 20

## 2019-10-01 ENCOUNTER — Ambulatory Visit (HOSPITAL_COMMUNITY): Payer: BC Managed Care – PPO | Admitting: Certified Registered Nurse Anesthetist

## 2019-10-01 ENCOUNTER — Inpatient Hospital Stay (HOSPITAL_COMMUNITY)
Admission: AD | Admit: 2019-10-01 | Discharge: 2019-10-03 | DRG: 326 | Disposition: A | Discharge status: Home / Self Care | Payer: BC Managed Care – PPO | Attending: Surgery | Admitting: Surgery

## 2019-10-01 ENCOUNTER — Encounter (HOSPITAL_COMMUNITY): Payer: Self-pay | Admitting: Surgery

## 2019-10-01 ENCOUNTER — Other Ambulatory Visit: Payer: Self-pay

## 2019-10-01 ENCOUNTER — Encounter (HOSPITAL_COMMUNITY)
Admission: AD | Disposition: A | Discharge status: Home / Self Care | Payer: Self-pay | Source: Home / Self Care | Attending: Surgery

## 2019-10-01 DIAGNOSIS — Z8249 Family history of ischemic heart disease and other diseases of the circulatory system: Secondary | ICD-10-CM | POA: Diagnosis not present

## 2019-10-01 DIAGNOSIS — J45902 Unspecified asthma with status asthmaticus: Secondary | ICD-10-CM | POA: Diagnosis not present

## 2019-10-01 DIAGNOSIS — K449 Diaphragmatic hernia without obstruction or gangrene: Secondary | ICD-10-CM | POA: Diagnosis present

## 2019-10-01 DIAGNOSIS — I10 Essential (primary) hypertension: Secondary | ICD-10-CM | POA: Diagnosis present

## 2019-10-01 DIAGNOSIS — Z833 Family history of diabetes mellitus: Secondary | ICD-10-CM | POA: Diagnosis not present

## 2019-10-01 DIAGNOSIS — F329 Major depressive disorder, single episode, unspecified: Secondary | ICD-10-CM | POA: Diagnosis present

## 2019-10-01 DIAGNOSIS — Z888 Allergy status to other drugs, medicaments and biological substances status: Secondary | ICD-10-CM | POA: Diagnosis not present

## 2019-10-01 DIAGNOSIS — Z813 Family history of other psychoactive substance abuse and dependence: Secondary | ICD-10-CM

## 2019-10-01 DIAGNOSIS — Z823 Family history of stroke: Secondary | ICD-10-CM | POA: Diagnosis not present

## 2019-10-01 DIAGNOSIS — D649 Anemia, unspecified: Secondary | ICD-10-CM | POA: Diagnosis present

## 2019-10-01 DIAGNOSIS — Z87891 Personal history of nicotine dependence: Secondary | ICD-10-CM | POA: Diagnosis not present

## 2019-10-01 DIAGNOSIS — Z800 Family history of malignant neoplasm of digestive organs: Secondary | ICD-10-CM | POA: Diagnosis not present

## 2019-10-01 DIAGNOSIS — J690 Pneumonitis due to inhalation of food and vomit: Secondary | ICD-10-CM | POA: Diagnosis present

## 2019-10-01 DIAGNOSIS — F32A Depression, unspecified: Secondary | ICD-10-CM | POA: Diagnosis present

## 2019-10-01 DIAGNOSIS — Z86010 Personal history of colonic polyps: Secondary | ICD-10-CM | POA: Diagnosis not present

## 2019-10-01 DIAGNOSIS — K317 Polyp of stomach and duodenum: Secondary | ICD-10-CM | POA: Diagnosis not present

## 2019-10-01 DIAGNOSIS — Z96643 Presence of artificial hip joint, bilateral: Secondary | ICD-10-CM | POA: Diagnosis not present

## 2019-10-01 DIAGNOSIS — K219 Gastro-esophageal reflux disease without esophagitis: Secondary | ICD-10-CM | POA: Diagnosis not present

## 2019-10-01 DIAGNOSIS — Z841 Family history of disorders of kidney and ureter: Secondary | ICD-10-CM

## 2019-10-01 DIAGNOSIS — E669 Obesity, unspecified: Secondary | ICD-10-CM | POA: Diagnosis present

## 2019-10-01 DIAGNOSIS — Z880 Allergy status to penicillin: Secondary | ICD-10-CM | POA: Diagnosis not present

## 2019-10-01 DIAGNOSIS — Z811 Family history of alcohol abuse and dependence: Secondary | ICD-10-CM | POA: Diagnosis not present

## 2019-10-01 DIAGNOSIS — E785 Hyperlipidemia, unspecified: Secondary | ICD-10-CM | POA: Diagnosis present

## 2019-10-01 DIAGNOSIS — Z9889 Other specified postprocedural states: Secondary | ICD-10-CM

## 2019-10-01 DIAGNOSIS — Z8261 Family history of arthritis: Secondary | ICD-10-CM | POA: Diagnosis not present

## 2019-10-01 DIAGNOSIS — K2100 Gastro-esophageal reflux disease with esophagitis, without bleeding: Principal | ICD-10-CM | POA: Diagnosis present

## 2019-10-01 HISTORY — PX: HIATAL HERNIA REPAIR: SHX195

## 2019-10-01 LAB — CBC
HCT: 40.3 % (ref 36.0–46.0)
Hemoglobin: 13 g/dL (ref 12.0–15.0)
MCH: 28.6 pg (ref 26.0–34.0)
MCHC: 32.3 g/dL (ref 30.0–36.0)
MCV: 88.8 fL (ref 80.0–100.0)
Platelets: 183 10*3/uL (ref 150–400)
RBC: 4.54 MIL/uL (ref 3.87–5.11)
RDW: 14.3 % (ref 11.5–15.5)
WBC: 8.3 10*3/uL (ref 4.0–10.5)
nRBC: 0 % (ref 0.0–0.2)

## 2019-10-01 LAB — CREATININE, SERUM
Creatinine, Ser: 0.74 mg/dL (ref 0.44–1.00)
GFR calc Af Amer: >60 mL/min
GFR calc non Af Amer: >60 mL/min

## 2019-10-01 SURGERY — REPAIR, HERNIA, HIATAL, LAPAROSCOPIC
Anesthesia: General | Site: Abdomen

## 2019-10-01 MED ORDER — ONDANSETRON 4 MG PO TBDP: 4.0000 mg | ORAL_TABLET | Freq: Four times a day (QID) | ORAL | Status: DC | PRN

## 2019-10-01 MED ORDER — SUGAMMADEX SODIUM 500 MG/5ML IV SOLN
INTRAVENOUS | Status: DC | PRN
  Administered 2019-10-01: 240 mg via INTRAVENOUS

## 2019-10-01 MED ORDER — SODIUM CHLORIDE (PF) 0.9 % IJ SOLN
INTRAMUSCULAR | Status: DC | PRN
  Administered 2019-10-01: 10 mL

## 2019-10-01 MED ORDER — SODIUM CHLORIDE (PF) 0.9 % IJ SOLN
INTRAMUSCULAR | Status: AC
  Filled 2019-10-01: qty 10

## 2019-10-01 MED ORDER — PROMETHAZINE HCL 25 MG/ML IJ SOLN: 6.2500 mg | INTRAMUSCULAR | Status: DC | PRN

## 2019-10-01 MED ORDER — HEPARIN SODIUM (PORCINE) 5000 UNIT/ML IJ SOLN
5000.0000 [IU] | Freq: Three times a day (TID) | INTRAMUSCULAR | Status: DC
  Administered 2019-10-01 – 2019-10-03 (×5): 5000 [IU] via SUBCUTANEOUS
  Filled 2019-10-01 (×5): qty 1

## 2019-10-01 MED ORDER — FENTANYL CITRATE (PF) 100 MCG/2ML IJ SOLN
INTRAMUSCULAR | Status: DC | PRN
  Administered 2019-10-01: 50 ug via INTRAVENOUS
  Administered 2019-10-01: 25 ug via INTRAVENOUS
  Administered 2019-10-01 (×2): 50 ug via INTRAVENOUS
  Administered 2019-10-01: 25 ug via INTRAVENOUS

## 2019-10-01 MED ORDER — HYDROMORPHONE HCL 1 MG/ML IJ SOLN: 0.2500 mg | INTRAMUSCULAR | Status: DC | PRN

## 2019-10-01 MED ORDER — ONDANSETRON HCL 4 MG/2ML IJ SOLN
INTRAMUSCULAR | Status: AC
  Filled 2019-10-01: qty 2

## 2019-10-01 MED ORDER — PHENYLEPHRINE 40 MCG/ML (10ML) SYRINGE FOR IV PUSH (FOR BLOOD PRESSURE SUPPORT)
PREFILLED_SYRINGE | INTRAVENOUS | Status: AC
  Filled 2019-10-01: qty 10

## 2019-10-01 MED ORDER — PROCHLORPERAZINE EDISYLATE 10 MG/2ML IJ SOLN: 5.0000 mg | Freq: Four times a day (QID) | INTRAMUSCULAR | Status: DC | PRN

## 2019-10-01 MED ORDER — SCOPOLAMINE 1 MG/3DAYS TD PT72
1.0000 | MEDICATED_PATCH | TRANSDERMAL | Status: DC
  Administered 2019-10-01: 1.5 mg via TRANSDERMAL
  Filled 2019-10-01: qty 1

## 2019-10-01 MED ORDER — EPHEDRINE SULFATE-NACL 50-0.9 MG/10ML-% IV SOSY
PREFILLED_SYRINGE | INTRAVENOUS | Status: DC | PRN
  Administered 2019-10-01 (×2): 10 mg via INTRAVENOUS

## 2019-10-01 MED ORDER — FENTANYL CITRATE (PF) 100 MCG/2ML IJ SOLN
INTRAMUSCULAR | Status: AC
  Filled 2019-10-01: qty 2

## 2019-10-01 MED ORDER — ONDANSETRON HCL 4 MG/2ML IJ SOLN: 4.0000 mg | Freq: Four times a day (QID) | INTRAMUSCULAR | Status: DC | PRN

## 2019-10-01 MED ORDER — FLUTICASONE FUROATE-VILANTEROL 100-25 MCG/INH IN AEPB
1.0000 | INHALATION_SPRAY | Freq: Every day | RESPIRATORY_TRACT | Status: DC
  Filled 2019-10-01: qty 28

## 2019-10-01 MED ORDER — EPHEDRINE 5 MG/ML INJ
INTRAVENOUS | Status: AC
  Filled 2019-10-01: qty 10

## 2019-10-01 MED ORDER — ROCURONIUM BROMIDE 10 MG/ML (PF) SYRINGE
PREFILLED_SYRINGE | INTRAVENOUS | Status: AC
  Filled 2019-10-01: qty 10

## 2019-10-01 MED ORDER — ACETAMINOPHEN 10 MG/ML IV SOLN
INTRAVENOUS | Status: DC | PRN
  Administered 2019-10-01: 1000 mg via INTRAVENOUS

## 2019-10-01 MED ORDER — LIDOCAINE 2% (20 MG/ML) 5 ML SYRINGE
INTRAMUSCULAR | Status: DC | PRN
  Administered 2019-10-01: 1.5 mg/kg/h via INTRAVENOUS

## 2019-10-01 MED ORDER — PHENYLEPHRINE 40 MCG/ML (10ML) SYRINGE FOR IV PUSH (FOR BLOOD PRESSURE SUPPORT)
PREFILLED_SYRINGE | INTRAVENOUS | Status: DC | PRN
  Administered 2019-10-01 (×3): 80 ug via INTRAVENOUS

## 2019-10-01 MED ORDER — SUGAMMADEX SODIUM 500 MG/5ML IV SOLN
INTRAVENOUS | Status: AC
  Filled 2019-10-01: qty 5

## 2019-10-01 MED ORDER — ONDANSETRON HCL 4 MG/2ML IJ SOLN
INTRAMUSCULAR | Status: DC | PRN
  Administered 2019-10-01: 4 mg via INTRAVENOUS

## 2019-10-01 MED ORDER — PANTOPRAZOLE SODIUM 40 MG IV SOLR
40.0000 mg | Freq: Every day | INTRAVENOUS | Status: DC
  Administered 2019-10-01: 40 mg via INTRAVENOUS
  Filled 2019-10-01: qty 40

## 2019-10-01 MED ORDER — GABAPENTIN 300 MG PO CAPS
300.0000 mg | ORAL_CAPSULE | ORAL | Status: AC
  Administered 2019-10-01: 300 mg via ORAL
  Filled 2019-10-01: qty 1

## 2019-10-01 MED ORDER — FLUTICASONE FUROATE-VILANTEROL 100-25 MCG/INH IN AEPB: 1.0000 | INHALATION_SPRAY | Freq: Every day | RESPIRATORY_TRACT | Status: DC

## 2019-10-01 MED ORDER — FENTANYL CITRATE (PF) 100 MCG/2ML IJ SOLN
12.5000 ug | INTRAMUSCULAR | Status: DC | PRN
  Administered 2019-10-01 – 2019-10-02 (×2): 12.5 ug via INTRAVENOUS
  Filled 2019-10-01 (×2): qty 2

## 2019-10-01 MED ORDER — 0.9 % SODIUM CHLORIDE (POUR BTL) OPTIME
TOPICAL | Status: DC | PRN
  Administered 2019-10-01: 1000 mL

## 2019-10-01 MED ORDER — CIPROFLOXACIN IN D5W 400 MG/200ML IV SOLN
400.0000 mg | Freq: Two times a day (BID) | INTRAVENOUS | Status: AC
  Administered 2019-10-01: 400 mg via INTRAVENOUS
  Filled 2019-10-01: qty 200

## 2019-10-01 MED ORDER — PROCHLORPERAZINE MALEATE 10 MG PO TABS
10.0000 mg | ORAL_TABLET | Freq: Four times a day (QID) | ORAL | Status: DC | PRN
  Filled 2019-10-01: qty 1

## 2019-10-01 MED ORDER — BUPIVACAINE LIPOSOME 1.3 % IJ SUSP
INTRAMUSCULAR | Status: DC | PRN
  Administered 2019-10-01: 20 mL

## 2019-10-01 MED ORDER — DEXAMETHASONE SODIUM PHOSPHATE 10 MG/ML IJ SOLN
INTRAMUSCULAR | Status: AC
  Filled 2019-10-01: qty 1

## 2019-10-01 MED ORDER — DEXAMETHASONE SODIUM PHOSPHATE 10 MG/ML IJ SOLN
INTRAMUSCULAR | Status: DC | PRN
  Administered 2019-10-01: 5 mg via INTRAVENOUS

## 2019-10-01 MED ORDER — PROPOFOL 10 MG/ML IV BOLUS
INTRAVENOUS | Status: DC | PRN
  Administered 2019-10-01: 140 mg via INTRAVENOUS

## 2019-10-01 MED ORDER — KCL IN DEXTROSE-NACL 20-5-0.45 MEQ/L-%-% IV SOLN
INTRAVENOUS | Status: DC
  Filled 2019-10-01 (×2): qty 1000

## 2019-10-01 MED ORDER — ALBUTEROL SULFATE HFA 108 (90 BASE) MCG/ACT IN AERS: 2.0000 | INHALATION_SPRAY | Freq: Four times a day (QID) | RESPIRATORY_TRACT | Status: DC | PRN

## 2019-10-01 MED ORDER — MEPERIDINE HCL 50 MG/ML IJ SOLN: 6.2500 mg | INTRAMUSCULAR | Status: DC | PRN

## 2019-10-01 MED ORDER — LIDOCAINE 2% (20 MG/ML) 5 ML SYRINGE
INTRAMUSCULAR | Status: AC
  Filled 2019-10-01: qty 10

## 2019-10-01 MED ORDER — LACTATED RINGERS IV SOLN
INTRAVENOUS | Status: DC
  Administered 2019-10-01: 1000 mL via INTRAVENOUS

## 2019-10-01 MED ORDER — MIDAZOLAM HCL 2 MG/2ML IJ SOLN
INTRAMUSCULAR | Status: AC
  Filled 2019-10-01: qty 2

## 2019-10-01 MED ORDER — ALBUTEROL SULFATE (2.5 MG/3ML) 0.083% IN NEBU: 2.5000 mg | INHALATION_SOLUTION | Freq: Four times a day (QID) | RESPIRATORY_TRACT | Status: DC | PRN

## 2019-10-01 MED ORDER — MIDAZOLAM HCL 5 MG/5ML IJ SOLN
INTRAMUSCULAR | Status: DC | PRN
  Administered 2019-10-01: 2 mg via INTRAVENOUS

## 2019-10-01 MED ORDER — CIPROFLOXACIN IN D5W 400 MG/200ML IV SOLN
400.0000 mg | INTRAVENOUS | Status: AC
  Administered 2019-10-01: 400 mg via INTRAVENOUS
  Filled 2019-10-01: qty 200

## 2019-10-01 MED ORDER — ACETAMINOPHEN 10 MG/ML IV SOLN
INTRAVENOUS | Status: AC
  Filled 2019-10-01: qty 100

## 2019-10-01 MED ORDER — PROPOFOL 10 MG/ML IV BOLUS
INTRAVENOUS | Status: AC
  Filled 2019-10-01: qty 20

## 2019-10-01 MED ORDER — CHLORHEXIDINE GLUCONATE CLOTH 2 % EX PADS: 6.0000 | MEDICATED_PAD | Freq: Once | CUTANEOUS | Status: DC

## 2019-10-01 MED ORDER — ROCURONIUM BROMIDE 50 MG/5ML IV SOSY
PREFILLED_SYRINGE | INTRAVENOUS | Status: DC | PRN
  Administered 2019-10-01 (×3): 10 mg via INTRAVENOUS
  Administered 2019-10-01: 60 mg via INTRAVENOUS
  Administered 2019-10-01 (×2): 10 mg via INTRAVENOUS

## 2019-10-01 MED ORDER — KETAMINE HCL 10 MG/ML IJ SOLN
INTRAMUSCULAR | Status: DC | PRN
  Administered 2019-10-01: 25 mg via INTRAVENOUS

## 2019-10-01 MED ORDER — LIDOCAINE 2% (20 MG/ML) 5 ML SYRINGE
INTRAMUSCULAR | Status: DC | PRN
  Administered 2019-10-01: 100 mg via INTRAVENOUS

## 2019-10-01 MED ORDER — METOPROLOL TARTRATE 5 MG/5ML IV SOLN: 5.0000 mg | Freq: Four times a day (QID) | INTRAVENOUS | Status: DC | PRN

## 2019-10-01 MED ORDER — LACTATED RINGERS IR SOLN
Status: DC | PRN
  Administered 2019-10-01: 1000 mL

## 2019-10-01 MED ORDER — LIDOCAINE 2% (20 MG/ML) 5 ML SYRINGE
INTRAMUSCULAR | Status: AC
  Filled 2019-10-01: qty 5

## 2019-10-01 SURGICAL SUPPLY — 56 items
APPLIER CLIP ROT 10 11.4 M/L (STAPLE) ×2
CABLE HIGH FREQUENCY MONO STRZ (ELECTRODE) IMPLANT
CLAMP ENDO BABCK 10MM (STAPLE) IMPLANT
CLIP APPLIE ROT 10 11.4 M/L (STAPLE) ×1 IMPLANT
COVER SURGICAL LIGHT HANDLE (MISCELLANEOUS) ×2 IMPLANT
COVER WAND RF STERILE (DRAPES) IMPLANT
DECANTER SPIKE VIAL GLASS SM (MISCELLANEOUS) ×2 IMPLANT
DERMABOND ADVANCED (GAUZE/BANDAGES/DRESSINGS) ×1
DERMABOND ADVANCED .7 DNX12 (GAUZE/BANDAGES/DRESSINGS) ×1 IMPLANT
DEVICE SUT QUICK LOAD TK 5 (STAPLE) ×16 IMPLANT
DEVICE SUT TI-KNOT TK 5X26 (MISCELLANEOUS) ×2 IMPLANT
DEVICE SUTURE ENDOST 10MM (ENDOMECHANICALS) ×2 IMPLANT
DISSECTOR BLUNT TIP ENDO 5MM (MISCELLANEOUS) ×2 IMPLANT
DRAIN PENROSE 0.5X18 (DRAIN) ×2 IMPLANT
ELECT L-HOOK LAP 45CM DISP (ELECTROSURGICAL)
ELECT PENCIL ROCKER SW 15FT (MISCELLANEOUS) ×2 IMPLANT
ELECT REM PT RETURN 15FT ADLT (MISCELLANEOUS) ×2 IMPLANT
ELECTRODE L-HOOK LAP 45CM DISP (ELECTROSURGICAL) IMPLANT
FELT TEFLON 4 X1 (Mesh General) ×2 IMPLANT
GLOVE BIOGEL M 8.0 STRL (GLOVE) ×2 IMPLANT
GOWN STRL REUS W/TWL XL LVL3 (GOWN DISPOSABLE) ×6 IMPLANT
GRASPER ENDO BABCOCK 10 (MISCELLANEOUS) IMPLANT
GRASPER ENDO BABCOCK 10MM (MISCELLANEOUS)
HANDLE STAPLE EGIA 4 XL (STAPLE) ×2 IMPLANT
KIT BASIN OR (CUSTOM PROCEDURE TRAY) ×2 IMPLANT
KIT PROCEDURE OLYMPUS (MISCELLANEOUS) ×2 IMPLANT
KIT TURNOVER KIT A (KITS) IMPLANT
PAD POSITIONING PINK XL (MISCELLANEOUS) ×2 IMPLANT
PENCIL SMOKE EVACUATOR (MISCELLANEOUS) IMPLANT
POUCH RETRIEVAL ECOSAC 10 (ENDOMECHANICALS) ×1 IMPLANT
POUCH RETRIEVAL ECOSAC 10MM (ENDOMECHANICALS) ×2
PROTECTOR NERVE ULNAR (MISCELLANEOUS) ×2 IMPLANT
RELOAD TRI 60 ART MED THCK PUR (STAPLE) ×2 IMPLANT
SCISSORS LAP 5X45 EPIX DISP (ENDOMECHANICALS) ×2 IMPLANT
SET IRRIG TUBING LAPAROSCOPIC (IRRIGATION / IRRIGATOR) IMPLANT
SET TUBE SMOKE EVAC HIGH FLOW (TUBING) ×2 IMPLANT
SHEARS HARMONIC ACE PLUS 45CM (MISCELLANEOUS) ×2 IMPLANT
SLEEVE ADV FIXATION 5X100MM (TROCAR) ×4 IMPLANT
SUT MNCRL AB 4-0 PS2 18 (SUTURE) IMPLANT
SUT SURGIDAC NAB ES-9 0 48 120 (SUTURE) ×18 IMPLANT
SUT VIC AB 4-0 SH 18 (SUTURE) IMPLANT
TAPE CLOTH 4X10 WHT NS (GAUZE/BANDAGES/DRESSINGS) IMPLANT
TIP INNERVISION DETACH 40FR (MISCELLANEOUS) IMPLANT
TIP INNERVISION DETACH 50FR (MISCELLANEOUS) IMPLANT
TIP INNERVISION DETACH 56FR (MISCELLANEOUS) ×2 IMPLANT
TIPS INNERVISION DETACH 40FR (MISCELLANEOUS)
TOWEL OR 17X26 10 PK STRL BLUE (TOWEL DISPOSABLE) ×2 IMPLANT
TOWEL OR NON WOVEN STRL DISP B (DISPOSABLE) ×2 IMPLANT
TRAY FOLEY MTR SLVR 14FR STAT (SET/KITS/TRAYS/PACK) ×2 IMPLANT
TRAY LAPAROSCOPIC (CUSTOM PROCEDURE TRAY) ×2 IMPLANT
TROCAR ADV FIXATION 11X100MM (TROCAR) ×2 IMPLANT
TROCAR ADV FIXATION 5X100MM (TROCAR) ×2 IMPLANT
TROCAR BLADELESS 15MM (ENDOMECHANICALS) ×2 IMPLANT
TROCAR BLADELESS OPT 5 100 (ENDOMECHANICALS) ×2 IMPLANT
TROCAR XCEL NON-BLD 11X100MML (ENDOMECHANICALS) ×2 IMPLANT
TUBING ENDO SMARTCAP (MISCELLANEOUS) ×2 IMPLANT

## 2019-10-01 NOTE — Anesthesia Postprocedure Evaluation (Signed)
Anesthesia Post Note  Patient: Shannon Mckenzie  Procedure(s) Performed: LAPAROSCOPIC REPAIR OF HIATAL HERNIA WITH NISSEN FUNDOPLICATION, UPPER ENDO, REMOVAL OF GASTRIC MASS (N/A Abdomen)     Patient location during evaluation: PACU Anesthesia Type: General Level of consciousness: sedated and patient cooperative Pain management: pain level controlled Vital Signs Assessment: post-procedure vital signs reviewed and stable Respiratory status: spontaneous breathing Cardiovascular status: stable Anesthetic complications: no    Last Vitals:  Vitals:   10/01/19 1430 10/01/19 1455  BP: (!) 141/79 137/86  Pulse: 66 87  Resp: 12 16  Temp:  (!) 36.4 C  SpO2: 98% 100%    Last Pain:  Vitals:   10/01/19 1430  TempSrc:   PainSc: Asleep                 Lewie Loron

## 2019-10-01 NOTE — Anesthesia Preprocedure Evaluation (Signed)
Anesthesia Evaluation  Patient identified by MRN, date of birth, ID band Patient awake    Reviewed: Allergy & Precautions, NPO status , Patient's Chart, lab work & pertinent test results  History of Anesthesia Complications Negative for: history of anesthetic complications  Airway Mallampati: II  TM Distance: >3 FB Neck ROM: Full    Dental  (+) Dental Advisory Given   Pulmonary COPD (last inhaler use about a month ago),  COPD inhaler, former smoker,    breath sounds clear to auscultation       Cardiovascular hypertension, Pt. on medications (-) angina+ Valvular Problems/Murmurs  Rhythm:Regular Rate:Normal     Neuro/Psych Anxiety Depression negative neurological ROS     GI/Hepatic Neg liver ROS, hiatal hernia, GERD  Medicated,  Endo/Other  Morbid obesity  Renal/GU Renal disease     Musculoskeletal  (+) Arthritis  (off of steroids for past 5 days), Osteoarthritis and steroids,    Abdominal (+) + obese,   Peds  Hematology  (+) Blood dyscrasia, anemia ,   Anesthesia Other Findings   Reproductive/Obstetrics                             Anesthesia Physical  Anesthesia Plan  ASA: II  Anesthesia Plan: General   Post-op Pain Management:    Induction: Intravenous  PONV Risk Score and Plan: 3 and Ondansetron and Dexamethasone  Airway Management Planned: Oral ETT  Additional Equipment: None  Intra-op Plan:   Post-operative Plan: Extubation in OR  Informed Consent: I have reviewed the patients History and Physical, chart, labs and discussed the procedure including the risks, benefits and alternatives for the proposed anesthesia with the patient or authorized representative who has indicated his/her understanding and acceptance.     Dental advisory given  Plan Discussed with: CRNA  Anesthesia Plan Comments:         Anesthesia Quick Evaluation

## 2019-10-01 NOTE — Transfer of Care (Signed)
Immediate Anesthesia Transfer of Care Note  Patient: Shannon Mckenzie  Procedure(s) Performed: LAPAROSCOPIC REPAIR OF HIATAL HERNIA WITH NISSEN FUNDOPLICATION, UPPER ENDO, REMOVAL OF GASTRIC MASS (N/A Abdomen)  Patient Location: PACU  Anesthesia Type:General  Level of Consciousness: awake, alert  and patient cooperative  Airway & Oxygen Therapy: Patient Spontanous Breathing and Patient connected to face mask oxygen  Post-op Assessment: Report given to RN and Post -op Vital signs reviewed and stable  Post vital signs: Reviewed and stable  Last Vitals:  Vitals Value Taken Time  BP    Temp    Pulse 82 10/01/19 1342  Resp 15 10/01/19 1342  SpO2 97 % 10/01/19 1342  Vitals shown include unvalidated device data.  Last Pain:  Vitals:   10/01/19 0810  TempSrc:   PainSc: 0-No pain      Patients Stated Pain Goal: 3 (10/01/19 0810)  Complications: No apparent anesthesia complications

## 2019-10-01 NOTE — Op Note (Addendum)
Shannon Mckenzie  12/04/1960 01 October 2019    PCP:  [name], MD   Surgeon: Wenda Low, MD, FACS  Asst:  Darnell Level, MD, FACS  Anes:  general  Preop Dx: Aspiration pneumonia-recurrent, anemia and severe gerd Postop Dx: same  Procedure: Laparoscopic repair of large type III hiatal hernia with endoscopy, Nissen fundoplication over a #56 Bougie; resection of fundus diverticulum (path pending) Location Surgery: [facility] Complications: None noted  EBL:   20 cc  Drains: none  Description of Procedure:  The patient was taken to OR 1 .  After anesthesia was administered and the patient was prepped  with  Chloroprep and a timeout was performed.  Access to the abdomen was achieved with a 5 mm Optiview through the left upper quadrant.  At the end of the case we performed a TAP block with 30 cc of Exparel.  Trocars included a 15 to the right of the midline, an 11 to the left for a 10 mm camera and multiple 5 mm trocars.  The [place] provided liver retraction.  Harmonic scalpel was used to dissect the foregut.  A large vessel up on the diaphragm was clipped and divided.  The sack was reduced and eventually resected with the harmonic scalpel.    To check the anatomy and the fundus outpouching and to ensure that the EG junction was below the diaphragm, I performed endscopy and also to look at an outpouching along the greater curvature that might be a cause for bleeding or may be a neoplasm.  We looked at this with retroflexion and no intralumenal mass.  The outpouching (see third picture) appeared to be just that and it was resected with a single application of the 6 cm purple load with TRS (Covidien).  It was sent to path.   The hiatus was closed with 4 simple sutures and one with pledgets (4th picture).  The 56 lighted bougie was passed by Dr. Renold Don and the wrap was constructed with the stapled stomach wrapped on the right and the available fundus on the left.  These were secured with 3  Ty-Knots.  (5th picture)       The 15 mm trocar was closed anteriorly with 0 vicryl.  4-0 Monocryl was used to close the skin with Dermabond.       The patient tolerated the procedure well and was taken to the PACU in stable condition.     Matt B. Daphine Deutscher, MD, [facility] Surgery, [place] [phone]

## 2019-10-01 NOTE — Anesthesia Procedure Notes (Addendum)
Procedure Name: Intubation Date/Time: 10/01/2019 10:15 AM Performed by: West Pugh, CRNA Pre-anesthesia Checklist: Patient identified, Emergency Drugs available, Suction available, Patient being monitored and Timeout performed Patient Re-evaluated:Patient Re-evaluated prior to induction Oxygen Delivery Method: Circle system utilized Preoxygenation: Pre-oxygenation with 100% oxygen Induction Type: IV induction Ventilation: Mask ventilation without difficulty Laryngoscope Size: Mac and 3 Grade View: Grade I Tube type: Oral Tube size: 7.0 mm Number of attempts: 1 Airway Equipment and Method: Stylet Placement Confirmation: ETT inserted through vocal cords under direct vision,  positive ETCO2,  CO2 detector and breath sounds checked- equal and bilateral Secured at: 21 cm Tube secured with: Tape Dental Injury: Teeth and Oropharynx as per pre-operative assessment

## 2019-10-01 NOTE — Interval H&P Note (Signed)
History and Physical Interval Note:  10/01/2019 9:15 AM  Shannon Mckenzie  has presented today for surgery, with the diagnosis of LARGE TYPE III HIATAL HERNIA WITH CAMERON ULCERS.  The various methods of treatment have been discussed with the patient and family. After consideration of risks, benefits and other options for treatment, the patient has consented to  Procedure(s): LAPAROSCOPIC REPAIR OF HIATAL HERNIA WITH NISSEN FUNDOPLICATION (N/A) as a surgical intervention.  The patient's history has been reviewed, patient examined, no change in status, stable for surgery.  I have reviewed the patient's chart and labs.  Questions were answered to the patient's satisfaction.     [name]

## 2019-10-02 ENCOUNTER — Encounter (HOSPITAL_COMMUNITY): Payer: Self-pay | Admitting: Surgery

## 2019-10-02 MED ORDER — METHOCARBAMOL 1000 MG/10ML IJ SOLN
1000.0000 mg | Freq: Four times a day (QID) | INTRAVENOUS | Status: DC | PRN
  Filled 2019-10-02: qty 10

## 2019-10-02 MED ORDER — ESCITALOPRAM OXALATE 20 MG PO TABS
20.0000 mg | ORAL_TABLET | Freq: Every day | ORAL | Status: DC
  Administered 2019-10-02 – 2019-10-03 (×2): 20 mg via ORAL
  Filled 2019-10-02 (×2): qty 1

## 2019-10-02 MED ORDER — MAGIC MOUTHWASH
15.0000 mL | Freq: Four times a day (QID) | ORAL | Status: DC | PRN
  Filled 2019-10-02: qty 15

## 2019-10-02 MED ORDER — FERROUS SULFATE 325 (65 FE) MG PO TABS
325.0000 mg | ORAL_TABLET | Freq: Every day | ORAL | Status: DC
  Administered 2019-10-02 – 2019-10-03 (×2): 325 mg via ORAL
  Filled 2019-10-02 (×2): qty 1

## 2019-10-02 MED ORDER — SODIUM CHLORIDE 0.9 % IV SOLN: 250.0000 mL | INTRAVENOUS | Status: DC | PRN

## 2019-10-02 MED ORDER — EZETIMIBE 10 MG PO TABS
10.0000 mg | ORAL_TABLET | Freq: Every day | ORAL | Status: DC
  Administered 2019-10-02 – 2019-10-03 (×2): 10 mg via ORAL
  Filled 2019-10-02 (×2): qty 1

## 2019-10-02 MED ORDER — METOCLOPRAMIDE HCL 5 MG/ML IJ SOLN: 5.0000 mg | Freq: Three times a day (TID) | INTRAMUSCULAR | Status: DC | PRN

## 2019-10-02 MED ORDER — LOSARTAN POTASSIUM 50 MG PO TABS
50.0000 mg | ORAL_TABLET | Freq: Every day | ORAL | Status: DC
  Administered 2019-10-02 – 2019-10-03 (×2): 50 mg via ORAL
  Filled 2019-10-02 (×2): qty 1

## 2019-10-02 MED ORDER — RISAQUAD PO CAPS
1.0000 | ORAL_CAPSULE | Freq: Every day | ORAL | Status: DC
  Administered 2019-10-02 – 2019-10-03 (×2): 1 via ORAL
  Filled 2019-10-02 (×2): qty 1

## 2019-10-02 MED ORDER — ACETAMINOPHEN 160 MG/5ML PO SOLN
650.0000 mg | Freq: Four times a day (QID) | ORAL | Status: DC | PRN
  Administered 2019-10-02: 650 mg via ORAL
  Filled 2019-10-02: qty 20.3

## 2019-10-02 MED ORDER — ADULT MULTIVITAMIN W/MINERALS CH
1.0000 | ORAL_TABLET | Freq: Every day | ORAL | Status: DC
  Administered 2019-10-02 – 2019-10-03 (×2): 1 via ORAL
  Filled 2019-10-02 (×3): qty 1

## 2019-10-02 MED ORDER — LACTATED RINGERS IV BOLUS: 1000.0000 mL | Freq: Three times a day (TID) | INTRAVENOUS | Status: DC | PRN

## 2019-10-02 MED ORDER — GUAIFENESIN-DM 100-10 MG/5ML PO SYRP: 10.0000 mL | ORAL_SOLUTION | ORAL | Status: DC | PRN

## 2019-10-02 MED ORDER — MENTHOL 3 MG MT LOZG: 1.0000 | LOZENGE | OROMUCOSAL | Status: DC | PRN

## 2019-10-02 MED ORDER — PROCHLORPERAZINE EDISYLATE 10 MG/2ML IJ SOLN: 5.0000 mg | INTRAMUSCULAR | Status: DC | PRN

## 2019-10-02 MED ORDER — HYDROCORTISONE 1 % EX CREA
1.0000 "application " | TOPICAL_CREAM | Freq: Three times a day (TID) | CUTANEOUS | Status: DC | PRN
  Filled 2019-10-02: qty 28

## 2019-10-02 MED ORDER — MONTELUKAST SODIUM 10 MG PO TABS
10.0000 mg | ORAL_TABLET | Freq: Every day | ORAL | Status: DC
  Administered 2019-10-02: 10 mg via ORAL
  Filled 2019-10-02: qty 1

## 2019-10-02 MED ORDER — SODIUM CHLORIDE 0.9% FLUSH: 3.0000 mL | INTRAVENOUS | Status: DC | PRN

## 2019-10-02 MED ORDER — DIPHENHYDRAMINE HCL 50 MG/ML IJ SOLN: 12.5000 mg | Freq: Four times a day (QID) | INTRAMUSCULAR | Status: DC | PRN

## 2019-10-02 MED ORDER — SODIUM CHLORIDE 0.9% FLUSH
3.0000 mL | Freq: Two times a day (BID) | INTRAVENOUS | Status: DC
  Administered 2019-10-02 – 2019-10-03 (×2): 3 mL via INTRAVENOUS

## 2019-10-02 MED ORDER — MELATONIN 5 MG PO TABS: 5.0000 mg | ORAL_TABLET | Freq: Every evening | ORAL | Status: DC | PRN

## 2019-10-02 MED ORDER — ALUM & MAG HYDROXIDE-SIMETH 200-200-20 MG/5ML PO SUSP
30.0000 mL | Freq: Four times a day (QID) | ORAL | Status: DC | PRN
  Administered 2019-10-02 (×2): 30 mL via ORAL
  Filled 2019-10-02 (×2): qty 30

## 2019-10-02 MED ORDER — FLUTICASONE PROPIONATE 50 MCG/ACT NA SUSP
1.0000 | Freq: Every day | NASAL | Status: DC
  Filled 2019-10-02: qty 16

## 2019-10-02 MED ORDER — PANTOPRAZOLE SODIUM 40 MG PO TBEC
80.0000 mg | DELAYED_RELEASE_TABLET | Freq: Every day | ORAL | Status: DC
  Administered 2019-10-02 – 2019-10-03 (×2): 80 mg via ORAL
  Filled 2019-10-02 (×2): qty 2

## 2019-10-02 MED ORDER — HYDROCORTISONE (PERIANAL) 2.5 % EX CREA
1.0000 "application " | TOPICAL_CREAM | Freq: Four times a day (QID) | CUTANEOUS | Status: DC | PRN
  Filled 2019-10-02: qty 28.35

## 2019-10-02 MED ORDER — LIP MEDEX EX OINT
1.0000 "application " | TOPICAL_OINTMENT | Freq: Two times a day (BID) | CUTANEOUS | Status: DC
  Administered 2019-10-02 – 2019-10-03 (×3): 1 via TOPICAL
  Filled 2019-10-02: qty 7

## 2019-10-02 MED ORDER — BISACODYL 10 MG RE SUPP: 10.0000 mg | Freq: Two times a day (BID) | RECTAL | Status: DC | PRN

## 2019-10-02 MED ORDER — PHENOL 1.4 % MT LIQD
1.0000 | OROMUCOSAL | Status: DC | PRN
  Administered 2019-10-03: 2 via OROMUCOSAL
  Filled 2019-10-02 (×2): qty 177

## 2019-10-02 MED ORDER — SIMETHICONE 40 MG/0.6ML PO SUSP
40.0000 mg | Freq: Four times a day (QID) | ORAL | Status: DC | PRN
  Filled 2019-10-02: qty 0.6

## 2019-10-02 MED ORDER — ASCORBIC ACID 500 MG PO TABS
500.0000 mg | ORAL_TABLET | Freq: Every day | ORAL | Status: DC
  Administered 2019-10-02 – 2019-10-03 (×2): 500 mg via ORAL
  Filled 2019-10-02 (×3): qty 1

## 2019-10-02 NOTE — Progress Notes (Signed)
Shannon Mckenzie [id number] 06-22-1961  CARE TEAM:  PCP: [name], MD  Outpatient Care Team: Patient Care Team: [name], MD as PCP - General (Family Medicine)  Inpatient Treatment Team: Treatment Team: Attending Provider: [name], MD; Registered Nurse: Clarita Leber, RN; Technician: Vella Raring, NT   Problem List:   Principal Problem:   Hiatal hernia with GERD without esophagitis Active Problems:   Essential hypertension, benign   Obesity   Hyperlipidemia   Anemia   Depression   Asthma with status asthmaticus   Status post Nissen fundoplication   1 Day Post-Op  10/01/2019  Preop Dx:        Aspiration pneumonia-recurrent, anemia and severe gerd  Postop Dx:      same  Procedure:       Laparoscopic repair of large type III hiatal hernia Partial gastrectomy (gastric wall mass resection) Endoscopy Nissen fundoplication over a #56 Bougie  Surgeon:         Wenda Low, MD, FACS  Asst:                Darnell Level, MD, FACS   Assessment  Recovering  [facility] Stay = 1 days)  Plan:  -Liquid diet.  See if can advance to dysphagia 1/pured Stop IV fluids Nausea control Pain control Hypertension controlled -VTE prophylaxis- SCDs, etc -mobilize as tolerated to help recovery  D/C patient from hospital when patient meets criteria (anticipate in 1 day(s)):  Tolerating oral intake well Ambulating well Adequate pain control without IV medications Urinating  Having flatus Disposition planning in place   25 minutes spent in review, evaluation, examination, counseling, and coordination of care.  More than 50% of that time was spent in counseling.  I updated the patient's status to the patient.  Recommendations were made.  Questions were answered.  She expressed understanding & appreciation.   10/02/2019    Subjective: (Chief complaint)  Tolerating sips of liquids.  Walked in hallways.  Some pain/discomfort along the  left anterior chest wall and back.  Not exertional.  Mostly controlled.  Objective:  Vital signs:  Vitals:   10/01/19 1719 10/01/19 2155 10/02/19 0155 10/02/19 0615  BP: 136/80 138/78 115/68 129/79  Pulse: 74 63 67 74  Resp: 16 16 16 16   Temp: 97.6 F (36.4 C) 98.1 F (36.7 C) 98 F (36.7 C) 98.7 F (37.1 C)  TempSrc: Oral Oral Oral Oral  SpO2: 97% 98% 93% 95%  Weight:      Height:        Last BM Date: 09/30/19  Intake/Output   Yesterday:  04/09 0701 - 04/10 0700 In: 4207.7 [P.O.:400; I.V.:3207.6; IV Piggyback:500.1] Out: 1475 [Urine:1475] This shift:  No intake/output data recorded.  Bowel function:  Flatus: YES  BM:  No  Drain: (No drain)   Physical Exam:  General: Pt awake/alert in no acute distress Eyes: PERRL, normal EOM.  Sclera clear.  No icterus Neuro: CN II-XII intact w/o focal sensory/motor deficits. Lymph: No head/neck/groin lymphadenopathy Psych:  No delerium/psychosis/paranoia.  Oriented x 4 HENT: Normocephalic, Mucus membranes moist.  No thrush Neck: Supple, No tracheal deviation.  No obvious thyromegaly Chest: No pain to chest wall compression.  Good respiratory excursion.  No audible wheezing CV:  Pulses intact.  Regular rhythm.  No major extremity edema MS: Normal AROM mjr joints.  No obvious deformity  Abdomen: Soft.  Nondistended.  Mildly tender at incisions only.  No evidence of peritonitis.  No incarcerated hernias.  Ext:  No deformity.  No mjr edema.  No cyanosis Skin: No petechiae / purpurea.  No major sores.  Warm and dry    Results:   Cultures: Recent Results (from the past 720 hour(s))  Urine culture     Status: Abnormal   Collection Time: 09/10/19  6:54 AM   Specimen: In/Out Cath Urine  Result Value Ref Range Status   Specimen Description   Final    IN/OUT CATH URINE Performed at [facility], [address] [place], [place] [address]    Special Requests   Final    NONE Performed at [facility],  [address], [place], [place] [address]    Culture (A)  Final    <10,000 COLONIES/mL INSIGNIFICANT GROWTH Performed at [place] [address], [place], [place] [address]    Report Status 09/11/2019 FINAL  Final  Blood Culture (routine x 2)     Status: None   Collection Time: 09/10/19  7:54 AM   Specimen: BLOOD  Result Value Ref Range Status   Specimen Description BLOOD LEFT ANTECUBITAL  Final   Special Requests   Final    BOTTLES DRAWN AEROBIC AND ANAEROBIC Blood Culture adequate volume   Culture   Final    NO GROWTH 5 DAYS Performed at [facility], [address], [place], [place] [address]    Report Status 09/15/2019 FINAL  Final  Blood Culture (routine x 2)     Status: None   Collection Time: 09/10/19  7:55 AM   Specimen: BLOOD  Result Value Ref Range Status   Specimen Description BLOOD BLOOD LEFT FOREARM  Final   Special Requests   Final    BOTTLES DRAWN AEROBIC AND ANAEROBIC Blood Culture adequate volume   Culture   Final    NO GROWTH 5 DAYS Performed at [facility], [address], [place], [place] [address]    Report Status 09/15/2019 FINAL  Final  SARS CORONAVIRUS 2 (TAT 6-24 HRS) Nasopharyngeal Nasopharyngeal Swab     Status: None   Collection Time: 09/10/19  9:08 AM   Specimen: Nasopharyngeal Swab  Result Value Ref Range Status   SARS Coronavirus 2 NEGATIVE NEGATIVE Final    Comment: (NOTE) SARS-CoV-2 target nucleic acids are NOT DETECTED. The SARS-CoV-2 RNA is generally detectable in upper and lower respiratory specimens during the acute phase of infection. Negative results do not preclude SARS-CoV-2 infection, do not rule out co-infections with other pathogens, and should not be used as the sole basis for treatment or other patient management decisions. Negative results must be combined with clinical observations, patient history, and epidemiological information. The expected result is Negative. Fact Sheet for  Patients: SugarRoll.be Fact Sheet for Healthcare Providers: https://www.woods-mathews.com/ This test is not yet approved or cleared by the [place] FDA and  has been authorized for detection and/or diagnosis of SARS-CoV-2 by FDA under an Emergency Use Authorization (EUA). This EUA will remain  in effect (meaning this test can be used) for the duration of the COVID-19 declaration under Section 56 4(b)(1) of the Act, 21 U.S.C. section 360bbb-3(b)(1), unless the authorization is terminated or revoked sooner. Performed at [facility], [place] [address] [place], [place] [address]   SARS CORONAVIRUS 2 (TAT 6-24 HRS) Nasopharyngeal Nasopharyngeal Swab     Status: None   Collection Time: 09/28/19  9:02 AM   Specimen: Nasopharyngeal Swab  Result Value Ref Range Status   SARS Coronavirus 2 NEGATIVE NEGATIVE Final  Comment: (NOTE) SARS-CoV-2 target nucleic acids are NOT DETECTED. The SARS-CoV-2 RNA is generally detectable in upper and lower respiratory specimens during the acute phase of infection. Negative results do not preclude SARS-CoV-2 infection, do not rule out co-infections with other pathogens, and should not be used as the sole basis for treatment or other patient management decisions. Negative results must be combined with clinical observations, patient history, and epidemiological information. The expected result is Negative. Fact Sheet for Patients: HairSlick.no Fact Sheet for Healthcare Providers: quierodirigir.com This test is not yet approved or cleared by the [place] FDA and  has been authorized for detection and/or diagnosis of SARS-CoV-2 by FDA under an Emergency Use Authorization (EUA). This EUA will remain  in effect (meaning this test can be used) for the duration of the COVID-19 declaration under Section 56 4(b)(1) of the Act, 21 U.S.C. section  360bbb-3(b)(1), unless the authorization is terminated or revoked sooner. Performed at [facility], [address], [place], [place] [address]     Labs: Results for orders placed or performed during the hospital encounter of 10/01/19 (from the past 48 hour(s))  CBC     Status: None   Collection Time: 10/01/19  3:27 PM  Result Value Ref Range   WBC 8.3 4.0 - 10.5 K/uL   RBC 4.54 3.87 - 5.11 MIL/uL   Hemoglobin 13.0 12.0 - 15.0 g/dL   HCT 26.3 33.5 - 45.6 %   MCV 88.8 80.0 - 100.0 fL   MCH 28.6 26.0 - 34.0 pg   MCHC 32.3 30.0 - 36.0 g/dL   RDW 25.6 38.9 - 37.3 %   Platelets 183 150 - 400 K/uL   nRBC 0.0 0.0 - 0.2 %    Comment: Performed at [facility], [address] [place], [place] [address]  Creatinine, serum     Status: None   Collection Time: 10/01/19  3:27 PM  Result Value Ref Range   Creatinine, Ser 0.74 0.44 - 1.00 mg/dL   GFR calc non Af Amer >60 >60 mL/min   GFR calc Af Amer >60 >60 mL/min    Comment: Performed at [facility], [address], [place], [place] [address]    Imaging / Studies: No results found.  Medications / Allergies: per chart  Antibiotics: Anti-infectives (From admission, onward)   Start     Dose/Rate Route Frequency Ordered Stop   10/01/19 2200  ciprofloxacin (CIPRO) IVPB 400 mg     400 mg 200 mL/hr over 60 Minutes Intravenous Every 12 hours 10/01/19 1502 10/01/19 2254   10/01/19 0745  ciprofloxacin (CIPRO) IVPB 400 mg     400 mg 200 mL/hr over 60 Minutes Intravenous On call to O.R. 10/01/19 0740 10/01/19 1046        Note: Portions of this report may have been transcribed using voice recognition software. Every effort was made to ensure accuracy; however, inadvertent computerized transcription errors may be present.   Any transcriptional errors that result from this process are unintentional.     Ardeth Sportsman, MD, FACS, MASCRS [facility]  [facility]    [address] [place], [place] [address] [phone] Main / Paging [phone] Fax Please see Amion for pager number, especial 5pm - 7am.

## 2019-10-03 MED ORDER — HYDROCODONE-ACETAMINOPHEN 5-325 MG PO TABS: 1.0000 | ORAL_TABLET | Freq: Four times a day (QID) | ORAL | 0 refills | Status: DC | PRN

## 2019-10-03 MED ORDER — ONDANSETRON HCL 4 MG PO TABS: 4.0000 mg | ORAL_TABLET | Freq: Three times a day (TID) | ORAL | 5 refills | Status: DC | PRN

## 2019-10-03 MED ORDER — PHENOL 1.4 % MT LIQD: 1.0000 | OROMUCOSAL | Status: DC | PRN

## 2019-10-03 MED ORDER — SCOPOLAMINE 1 MG/3DAYS TD PT72: 1.0000 | MEDICATED_PATCH | TRANSDERMAL | 3 refills | Status: DC

## 2019-10-03 NOTE — Discharge Instructions (Signed)
EATING AFTER YOUR ESOPHAGEAL SURGERY (Stomach Fundoplication, Hiatal Hernia repair, Achalasia surgery, etc)  ######################################################################  EAT Start with a pureed / full liquid diet (see below) Gradually transition to a high fiber diet with a fiber supplement over the next month after discharge.    WALK Walk an hour a day.  Control your pain to do that.    CONTROL PAIN Control pain so that you can walk, sleep, tolerate sneezing/coughing, go up/down stairs.  HAVE A BOWEL MOVEMENT DAILY Keep your bowels regular to avoid problems.  OK to try a laxative to override constipation.  OK to use an antidairrheal to slow down diarrhea.  Call if not better after 2 tries  CALL IF YOU HAVE PROBLEMS/CONCERNS Call if you are still struggling despite following these instructions. Call if you have concerns not answered by these instructions  ######################################################################   After your esophageal surgery, expect some sticking with swallowing over the next 1-2 months.    If food sticks when you eat, it is called "dysphagia".  This is due to swelling around your esophagus at the wrap & hiatal diaphragm repair.  It will gradually ease off over the next few months.  To help you through this temporary phase, we start you out on a pureed (blenderized) diet.  Your first meal in the hospital was thin liquids.  You should have been given a pureed diet by the time you left the hospital.  We ask patients to stay on a pureed diet for the first 2-3 weeks to avoid anything getting "stuck" near your recent surgery.  Don't be alarmed if your ability to swallow doesn't progress according to this plan.  Everyone is different and some diets can advance more or less quickly.    It is often helpful to crush your medications or split them as they can sometimes stick, especially the first week or so.   Some BASIC RULES to follow  are:  Maintain an upright position whenever eating or drinking.  Take small bites - just a teaspoon size bite at a time.  Eat slowly.  It may also help to eat only one food at a time.  Consider nibbling through smaller, more frequent meals & avoid the urge to eat BIG meals  Do not push through feelings of fullness, nausea, or bloatedness  Do not mix solid foods and liquids in the same mouthful  Try not to "wash foods down" with large gulps of liquids.  Avoid carbonated (bubbly/fizzy) drinks.    Avoid foods that make you feel gassy or bloated.  Start with bland foods first.  Wait on trying greasy, fried, or spicy meals until you are tolerating more bland solids well.  Understand that it will be hard to burp and belch at first.  This gradually improves with time.  Expect to be more gassy/flatulent/bloated initially.  Walking will help your body manage it better.  Consider using medications for bloating that contain simethicone such as  Maalox or Gas-X   Consider crushing her medications, especially smaller pills.  The ability to swallow pills should get easier after a few weeks  Eat in a relaxed atmosphere & minimize distractions.  Avoid talking while eating.    Do not use straws.  Following each meal, sit in an upright position (90 degree angle) for 60 to 90 minutes.  Going for a short walk can help as well  If food does stick, don't panic.  Try to relax and let the food pass on its own.    Sipping WARM LIQUID such as strong hot black tea can also help slide it down.   Be gradual in changes & use common sense:  -If you easily tolerating a certain "level" of foods, advance to the next level gradually -If you are having trouble swallowing a particular food, then avoid it.   -If food is sticking when you advance your diet, go back to thinner previous diet (the lower LEVEL) for 1-2 days.  LEVEL 1 = PUREED DIET  Do for the first 2 WEEKS AFTER SURGERY  -Foods in this group are  pureed or blenderized to a smooth, mashed potato-like consistency.  -If necessary, the pureed foods can keep their shape with the addition of a thickening agent.   -Meat should be pureed to a smooth, pasty consistency.  Hot broth or gravy may be added to the pureed meat, approximately 1 oz. of liquid per 3 oz. serving of meat. -CAUTION:  If any foods do not puree into a smooth consistency, swallowing will be more difficult.  (For example, nuts or seeds sometimes do not blend well.)  Hot Foods Cold Foods  Pureed scrambled eggs and cheese Pureed cottage cheese  Baby cereals Thickened juices and nectars  Thinned cooked cereals (no lumps) Thickened milk or eggnog  Pureed French toast or pancakes Ensure  Mashed potatoes Ice cream  Pureed parsley, au gratin, scalloped potatoes, candied sweet potatoes Fruit or Italian ice, sherbet  Pureed buttered or alfredo noodles Plain yogurt  Pureed vegetables (no corn or peas) Instant breakfast  Pureed soups and creamed soups Smooth pudding, mousse, custard  Pureed scalloped apples Whipped gelatin  Gravies Sugar, syrup, honey, jelly  Sauces, cheese, tomato, barbecue, white, creamed Cream  Any baby food Creamer  Alcohol in moderation (not beer or champagne) Margarine  Coffee or tea Mayonnaise   Ketchup, mustard   Apple sauce   SAMPLE MENU:  PUREED DIET Breakfast Lunch Dinner   Orange juice, 1/2 cup  Cream of wheat, 1/2 cup  Pineapple juice, 1/2 cup  Pureed turkey, barley soup, 3/4 cup  Pureed Hawaiian chicken, 3 oz   Scrambled eggs, mashed or blended with cheese, 1/2 cup  Tea or coffee, 1 cup   Whole milk, 1 cup   Non-dairy creamer, 2 Tbsp.  Mashed potatoes, 1/2 cup  Pureed cooled broccoli, 1/2 cup  Apple sauce, 1/2 cup  Coffee or tea  Mashed potatoes, 1/2 cup  Pureed spinach, 1/2 cup  Frozen yogurt, 1/2 cup  Tea or coffee      LEVEL 2 = SOFT DIET  After your first 2 weeks, you can advance to a soft diet.   Keep on this  diet until everything goes down easily.  Hot Foods Cold Foods  White fish Cottage cheese  Stuffed fish Junior baby fruit  Baby food meals Semi thickened juices  Minced soft cooked, scrambled, poached eggs nectars  Souffle & omelets Ripe mashed bananas  Cooked cereals Canned fruit, pineapple sauce, milk  potatoes Milkshake  Buttered or Alfredo noodles Custard  Cooked cooled vegetable Puddings, including tapioca  Sherbet Yogurt  Vegetable soup or alphabet soup Fruit ice, Italian ice  Gravies Whipped gelatin  Sugar, syrup, honey, jelly Junior baby desserts  Sauces:  Cheese, creamed, barbecue, tomato, white Cream  Coffee or tea Margarine   SAMPLE MENU:  LEVEL 2 Breakfast Lunch Dinner   Orange juice, 1/2 cup  Oatmeal, 1/2 cup  Scrambled eggs with cheese, 1/2 cup  Decaffeinated tea, 1 cup  Whole milk, 1 cup    Non-dairy creamer, 2 Tbsp  Pineapple juice, 1/2 cup  Minced beef, 3 oz  Gravy, 2 Tbsp  Mashed potatoes, 1/2 cup  Minced fresh broccoli, 1/2 cup  Applesauce, 1/2 cup  Coffee, 1 cup  Turkey, barley soup, 3/4 cup  Minced Hawaiian chicken, 3 oz  Mashed potatoes, 1/2 cup  Cooked spinach, 1/2 cup  Frozen yogurt, 1/2 cup  Non-dairy creamer, 2 Tbsp      LEVEL 3 = CHOPPED DIET  -After all the foods in level 2 (soft diet) are passing through well you should advance up to more chopped foods.  -It is still important to cut these foods into small pieces and eat slowly.  Hot Foods Cold Foods  Poultry Cottage cheese  Chopped Swedish meatballs Yogurt  Meat salads (ground or flaked meat) Milk  Flaked fish (tuna) Milkshakes  Poached or scrambled eggs Soft, cold, dry cereal  Souffles and omelets Fruit juices or nectars  Cooked cereals Chopped canned fruit  Chopped French toast or pancakes Canned fruit cocktail  Noodles or pasta (no rice) Pudding, mousse, custard  Cooked vegetables (no frozen peas, corn, or mixed vegetables) Green salad  Canned small sweet peas  Ice cream  Creamed soup or vegetable soup Fruit ice, Italian ice  Pureed vegetable soup or alphabet soup Non-dairy creamer  Ground scalloped apples Margarine  Gravies Mayonnaise  Sauces:  Cheese, creamed, barbecue, tomato, white Ketchup  Coffee or tea Mustard   SAMPLE MENU:  LEVEL 3 Breakfast Lunch Dinner   Orange juice, 1/2 cup  Oatmeal, 1/2 cup  Scrambled eggs with cheese, 1/2 cup  Decaffeinated tea, 1 cup  Whole milk, 1 cup  Non-dairy creamer, 2 Tbsp  Ketchup, 1 Tbsp  Margarine, 1 tsp  Salt, 1/4 tsp  Sugar, 2 tsp  Pineapple juice, 1/2 cup  Ground beef, 3 oz  Gravy, 2 Tbsp  Mashed potatoes, 1/2 cup  Cooked spinach, 1/2 cup  Applesauce, 1/2 cup  Decaffeinated coffee  Whole milk  Non-dairy creamer, 2 Tbsp  Margarine, 1 tsp  Salt, 1/4 tsp  Pureed turkey, barley soup, 3/4 cup  Barbecue chicken, 3 oz  Mashed potatoes, 1/2 cup  Ground fresh broccoli, 1/2 cup  Frozen yogurt, 1/2 cup  Decaffeinated tea, 1 cup  Non-dairy creamer, 2 Tbsp  Margarine, 1 tsp  Salt, 1/4 tsp  Sugar, 1 tsp    LEVEL 4:  REGULAR FOODS  -Foods in this group are soft, moist, regularly textured foods.   -This level includes meat and breads, which tend to be the hardest things to swallow.   -Eat very slowly, chew well and continue to avoid carbonated drinks. -most people are at this level in 4-6 weeks  Hot Foods Cold Foods  Baked fish or skinned Soft cheeses - cottage cheese  Souffles and omelets Cream cheese  Eggs Yogurt  Stuffed shells Milk  Spaghetti with meat sauce Milkshakes  Cooked cereal Cold dry cereals (no nuts, dried fruit, coconut)  French toast or pancakes Crackers  Buttered toast Fruit juices or nectars  Noodles or pasta (no rice) Canned fruit  Potatoes (all types) Ripe bananas  Soft, cooked vegetables (no corn, lima, or baked beans) Peeled, ripe, fresh fruit  Creamed soups or vegetable soup Cakes (no nuts, dried fruit, coconut)  Canned chicken  noodle soup Plain doughnuts  Gravies Ice cream  Bacon dressing Pudding, mousse, custard  Sauces:  Cheese, creamed, barbecue, tomato, white Fruit ice, Italian ice, sherbet  Decaffeinated tea or coffee Whipped gelatin  Pork chops Regular gelatin     Canned fruited gelatin molds   Sugar, syrup, honey, jam, jelly   Cream   Non-dairy   Margarine   Oil   Mayonnaise   Ketchup   Mustard   TROUBLESHOOTING IRREGULAR BOWELS  1) Avoid extremes of bowel movements (no bad constipation/diarrhea)  2) Miralax 17gm mixed in 8oz. water or juice-daily. May use BID as needed.  3) Gas-x,Phazyme, etc. as needed for gas & bloating.  4) Soft,bland diet. No spicy,greasy,fried foods.  5) Prilosec over-the-counter as needed  6) May hold gluten/wheat products from diet to see if symptoms improve.  7) May try probiotics (Align, Activa, etc) to help calm the bowels down  7) If symptoms become worse call back immediately.    If you have any questions please call our office at [facility]: [phone].   LAPAROSCOPIC SURGERY: POST OP INSTRUCTIONS  ######################################################################  EAT Gradually transition to a high fiber diet with a fiber supplement over the next few weeks after discharge.  Start with a pureed / full liquid diet (see below)  WALK Walk an hour a day.  Control your pain to do that.    CONTROL PAIN Control pain so that you can walk, sleep, tolerate sneezing/coughing, go up/down stairs.  HAVE A BOWEL MOVEMENT DAILY Keep your bowels regular to avoid problems.  OK to try a laxative to override constipation.  OK to use an antidairrheal to slow down diarrhea.  Call if not better after 2 tries  CALL IF YOU HAVE PROBLEMS/CONCERNS Call if you are still struggling despite following these instructions. Call if you have concerns not answered by these  instructions  ######################################################################    1. DIET: Follow a light bland diet & liquids the first 24 hours after arrival home, such as soup, liquids, starches, etc.  Be sure to drink plenty of fluids.  Quickly advance to a usual solid diet within a few days.  Avoid fast food or heavy meals as your are more likely to get nauseated or have irregular bowels.  A low-fat, high-fiber diet for the rest of your life is ideal.  2. Take your usually prescribed home medications unless otherwise directed.  3. PAIN CONTROL: a. Pain is best controlled by a usual combination of three different methods TOGETHER: i. Ice/Heat ii. Over the counter pain medication iii. Prescription pain medication b. Most patients will experience some swelling and bruising around the incisions.  Ice packs or heating pads (30-60 minutes up to 6 times a day) will help. Use ice for the first few days to help decrease swelling and bruising, then switch to heat to help relax tight/sore spots and speed recovery.  Some people prefer to use ice alone, heat alone, alternating between ice & heat.  Experiment to what works for you.  Swelling and bruising can take several weeks to resolve.   c. It is helpful to take an over-the-counter pain medication regularly for the first few weeks.  Choose one of the following that works best for you: i. Naproxen (Aleve, etc)  Two 220mg tabs twice a day ii. Ibuprofen (Advil, etc) Three 200mg tabs four times a day (every meal & bedtime) iii. Acetaminophen (Tylenol, etc) 500-650mg four times a day (every meal & bedtime) d. A  prescription for pain medication (such as oxycodone, hydrocodone, tramadol, gabapentin, methocarbamol, etc) should be given to you upon discharge.  Take your pain medication as prescribed.  i. If you are having problems/concerns with the prescription medicine (does not control pain, nausea, vomiting, rash, itching, etc), please   call us [phone] to see if we need to switch you to a different pain medicine that will work better for you and/or control your side effect better. ii. If you need a refill on your pain medication, please give us 48 hour notice.  contact your pharmacy.  They will contact our office to request authorization. Prescriptions will not be filled after 5 pm or on week-ends  4. Avoid getting constipated.   a. Between the surgery and the pain medications, it is common to experience some constipation.   b. Increasing fluid intake and taking a fiber supplement (such as Metamucil, Citrucel, FiberCon, MiraLax, etc) 1-2 times a day regularly will usually help prevent this problem from occurring.   c. A mild laxative (prune juice, Milk of Magnesia, MiraLax, etc) should be taken according to package directions if there are no bowel movements after 48 hours.   5. Watch out for diarrhea.   a. If you have many loose bowel movements, simplify your diet to bland foods & liquids for a few days.   b. Stop any stool softeners and decrease your fiber supplement.   c. Switching to mild anti-diarrheal medications (Kayopectate, Pepto Bismol) can help.   d. If this worsens or does not improve, please call us.  6. Wash / shower every day.  You may shower over the dressings as they are waterproof.  Continue to shower over incision(s) after the dressing is off.  7. Remove your waterproof bandages 5 days after surgery.  You may leave the incision open to air.  You may replace a dressing/Band-Aid to cover the incision for comfort if you wish.   8. ACTIVITIES as tolerated:   a. You may resume regular (light) daily activities beginning the next day--such as daily self-care, walking, climbing stairs--gradually increasing activities as tolerated.  If you can walk 30 minutes without difficulty, it is safe to try more intense activity such as jogging, treadmill, bicycling, low-impact aerobics, swimming, etc. b. Save the most intensive and  strenuous activity for last such as sit-ups, heavy lifting, contact sports, etc  Refrain from any heavy lifting or straining until you are off narcotics for pain control.   c. DO NOT PUSH THROUGH PAIN.  Let pain be your guide: If it hurts to do something, don't do it.  Pain is your body warning you to avoid that activity for another week until the pain goes down. d. You may drive when you are no longer taking prescription pain medication, you can comfortably wear a seatbelt, and you can safely maneuver your car and apply brakes. e. You may have sexual intercourse when it is comfortable.  9. FOLLOW UP in our office a. Please call [facility] at [phone] to set up an appointment to see your surgeon in the office for a follow-up appointment approximately 2-3 weeks after your surgery. b. Make sure that you call for this appointment the day you arrive home to insure a convenient appointment time.  10. IF YOU HAVE DISABILITY OR FAMILY LEAVE FORMS, BRING THEM TO THE OFFICE FOR PROCESSING.  DO NOT GIVE THEM TO YOUR DOCTOR.   WHEN TO CALL US [phone]: 1. Poor pain control 2. Reactions / problems with new medications (rash/itching, nausea, etc)  3. Fever over 101.5 F (38.5 C) 4. Inability to urinate 5. Nausea and/or vomiting 6. Worsening swelling or bruising 7. Continued bleeding from incision. 8. Increased pain, redness, or drainage from the incision   The clinic staff is available to   answer your questions during regular business hours (8:30am-5pm).  Please don't hesitate to call and ask to speak to one of our nurses for clinical concerns.   If you have a medical emergency, go to the nearest emergency room or call 911.  A surgeon from [facility] is always on call at the hospitals   [facility] [address], [address], [place], [place]  [address] ? MAIN: [phone] ? TOLL FREE: [phone] ?  FAX [phone] [email or web address]   

## 2019-10-03 NOTE — Progress Notes (Signed)
Discharge instructions discussed with patient and family, verbalized agreement and understanding 

## 2019-10-03 NOTE — Discharge Summary (Signed)
Physician Discharge Summary    Patient ID: Shannon Mckenzie MRN: [id number] DOB/AGE: 58-18-1962  58 y.o.  Patient Care Team: Lynnda Childody, Jessica R, MD as PCP - General (Family Medicine) Kerin SalenKarki, Arya, MD as Consulting Physician (Gastroenterology) Luretha MurphyMartin, Matthew, MD as Consulting Physician (General Surgery)  Admit date: 10/01/2019  Discharge date: 10/03/2019  Hospital Stay = 2 days    Discharge Diagnoses:  Principal Problem:   Hiatal hernia with GERD without esophagitis Active Problems:   Essential hypertension, benign   Obesity   Hyperlipidemia   Anemia   Depression   Asthma with status asthmaticus   Status post Nissen fundoplication   2 Days Post-Op  10/01/2019  POST-OPERATIVE DIAGNOSIS:   LARGE TYPE III HIATAL HERNIA WITH CAMERON ULCERS  SURGERY:  10/01/2019  Procedure(s): LAPAROSCOPIC REPAIR OF HIATAL HERNIA WITH NISSEN FUNDOPLICATION, UPPER ENDO, REMOVAL OF GASTRIC MASS  SURGEON:    Surgeon(s): Luretha MurphyMartin, Matthew, MD Darnell LevelGerkin, Todd, MD  Consults: None  Hospital Course:   The patient underwent the surgery above.  Postoperatively, the patient gradually mobilized and advanced to a solid diet.  Pain and other symptoms were treated aggressively.  Seen by Dr Daphine DeutscherMartin POD#1  By the time of discharge, the patient was walking well the hallways, tolerating liquids, having flatus.  Pain was well-controlled on an oral medications.  Based on meeting discharge criteria and continuing to recover, I felt it was safe for the patient to be discharged from the hospital to further recover with close followup. Postoperative recommendations were discussed in detail.  They are written as well.  Discharged Condition: good  Discharge Exam: Blood pressure (!) 147/80, pulse 69, temperature 98.7 F (37.1 C), temperature source Oral, resp. rate 16, height 5\' 2"  (1.575 m), weight 84.5 kg, last menstrual period 03/13/2015, SpO2 94 %.  General: Pt awake/alert/oriented x4 in No acute distress Eyes:  PERRL, normal EOM.  Sclera clear.  No icterus Neuro: CN II-XII intact w/o focal sensory/motor deficits. Lymph: No head/neck/groin lymphadenopathy Psych:  No delerium/psychosis/paranoia HENT: Normocephalic, Mucus membranes moist.  No thrush Neck: Supple, No tracheal deviation Chest: No chest wall pain w good excursion CV:  Pulses intact.  Regular rhythm MS: Normal AROM mjr joints.  No obvious deformity Abdomen: Soft.  Nondistended.  Mildly tender at incisions only.  No evidence of peritonitis.  No incarcerated hernias. Ext:  SCDs BLE.  No mjr edema.  No cyanosis Skin: No petechiae / purpura   Disposition:   Follow-up Information    Luretha MurphyMartin, Matthew, MD. Schedule an appointment as soon as possible for a visit in 3 week(s).   Specialty: General Surgery Contact information: [address] [place] [address] [phone]           Discharge disposition: 01-Home or Self Care       Discharge Instructions    Call MD for:   Complete by: As directed    Temperature > 101.19F   Call MD for:  extreme fatigue   Complete by: As directed    Call MD for:  hives   Complete by: As directed    Call MD for:  persistant nausea and vomiting   Complete by: As directed    Call MD for:  redness, tenderness, or signs of infection (pain, swelling, redness, odor or green/yellow discharge around incision site)   Complete by: As directed    Call MD for:  severe uncontrolled pain   Complete by: As directed    Diet general   Complete by: As directed  SEE ESOPHAGEAL SURGERY DIET INSTRUCTIONS  We using usually start you out on a pureed (blenderized) diet. Expect some sticking with swallowing over the next 1-2 months.   This is due to swelling around your esophagus at the wrap & hiatal diaphragm repair.  It will gradually ease off over the next few months.   Discharge instructions   Complete by: As directed    Please see discharge instruction sheets.   Also refer to any  handouts/printouts that may have been given from the [facility] surgery office (if you visited [place] there before surgery) Please call our office if you have any questions or concerns [phone]   Driving Restrictions   Complete by: As directed    No driving until off narcotics and can safely swerve away without pain during an emergency   Increase activity slowly   Complete by: As directed    Lifting restrictions   Complete by: As directed    Avoid heavy lifting initially, <20 pounds at first.   Do not push through pain.   You have no specific weight limit: If it hurts to do, DON'T DO IT.    If you feel no pain, you are not injuring anything.  Pain will protect you from injury.   Coughing and sneezing are far more stressful to your incision than any lifting.   Avoid resuming heavy lifting (>50 pounds) or other intense activity until off all narcotic pain medications.   When want to exercise more, give yourself 2 weeks to gradually get back to full intense exercise/activity.   May shower / Bathe   Complete by: As directed    [place].  It is fine for dressings or wounds to be washed/rinsed.  Use gentle soap & water.  This will help the incisions and/or wounds get clean & minimize infection.   May walk up steps   Complete by: As directed    Sexual Activity Restrictions   Complete by: As directed    Sexual activity as tolerated.  Do not push through pain.  Pain will protect you from injury.   Walk with assistance   Complete by: As directed    Walk over an hour a day.  May use a walker/cane/companion to help with balance and stamina.      Allergies as of 10/03/2019      Reactions   Statins    Myalgias with all statins   Amoxicillin Hives, Rash   Penicillins Hives, Other (See Comments), Rash      Medication List    TAKE these medications   acidophilus Caps capsule Take 1 capsule by mouth in the morning and at bedtime.   albuterol 108 (90 Base) MCG/ACT inhaler Commonly known  as: VENTOLIN HFA Inhale 2 puffs into the lungs every 6 (six) hours as needed for wheezing or shortness of breath.   albuterol (2.5 MG/3ML) 0.083% nebulizer solution Commonly known as: PROVENTIL Take 3 mLs (2.5 mg total) by nebulization every 6 (six) hours as needed for wheezing or shortness of breath.   ascorbic acid 500 MG tablet Commonly known as: VITAMIN C Take 500 mg by mouth daily.   Breo Ellipta 100-25 MCG/INH Aepb Generic drug: fluticasone furoate-vilanterol INHALE 1 PUFF INTO THE LUNGS DAILY What changed: See the new instructions.   cholecalciferol 25 MCG (1000 UNIT) tablet Commonly known as: VITAMIN D3 Take 1,000 Units by mouth daily.   escitalopram 20 MG tablet Commonly known as: LEXAPRO TAKE 1 TABLET BY MOUTH EVERY DAY  esomeprazole 40 MG capsule Commonly known as: NEXIUM Take 1 capsule (40 mg total) by mouth 2 (two) times daily.   ezetimibe 10 MG tablet Commonly known as: ZETIA TAKE 1 TABLET BY MOUTH EVERY DAY What changed:   how much to take  how to take this  when to take this  additional instructions   ferrous sulfate 325 (65 FE) MG tablet Take 325 mg by mouth daily with breakfast.   fluticasone 50 MCG/ACT nasal spray Commonly known as: FLONASE SHAKE LIQUID AND USE 1 SPRAY IN EACH NOSTRIL DAILY What changed: See the new instructions.   HYDROcodone-acetaminophen 5-325 MG tablet Commonly known as: NORCO/VICODIN Take 1-2 tablets by mouth every 6 (six) hours as needed for moderate pain or severe pain.   losartan 50 MG tablet Commonly known as: COZAAR TAKE 1 TABLET(50 MG) BY MOUTH DAILY What changed: See the new instructions.   melatonin 5 MG Tabs Take 5 mg by mouth at bedtime as needed (sleep).   montelukast 10 MG tablet Commonly known as: SINGULAIR Take 1 tablet (10 mg total) by mouth at bedtime.   multivitamin with minerals Tabs tablet Take 1 tablet by mouth daily.   ondansetron 4 MG tablet Commonly known as: ZOFRAN Take 1 tablet (4  mg total) by mouth every 8 (eight) hours as needed for nausea.   scopolamine 1 MG/3DAYS Commonly known as: Transderm-Scop (1.5 MG) Place 1 patch (1.5 mg total) onto the skin every 3 (three) days.       Significant Diagnostic Studies:  Results for orders placed or performed during the hospital encounter of 10/01/19 (from the past 72 hour(s))  CBC     Status: None   Collection Time: 10/01/19  3:27 PM  Result Value Ref Range   WBC 8.3 4.0 - 10.5 K/uL   RBC 4.54 3.87 - 5.11 MIL/uL   Hemoglobin 13.0 12.0 - 15.0 g/dL   HCT 66.4 40.3 - 47.4 %   MCV 88.8 80.0 - 100.0 fL   MCH 28.6 26.0 - 34.0 pg   MCHC 32.3 30.0 - 36.0 g/dL   RDW 25.9 56.3 - 87.5 %   Platelets 183 150 - 400 K/uL   nRBC 0.0 0.0 - 0.2 %    Comment: Performed at [facility], [address], [place], [place] [address]  Creatinine, serum     Status: None   Collection Time: 10/01/19  3:27 PM  Result Value Ref Range   Creatinine, Ser 0.74 0.44 - 1.00 mg/dL   GFR calc non Af Amer >60 >60 mL/min   GFR calc Af Amer >60 >60 mL/min    Comment: Performed at [facility], [address] [place], [place] [address]    No results found.  Past Medical History:  Diagnosis Date  . Allergy   . Anemia   . Anxiety   . Aspiration pneumonia (HCC) 09/10/2019  . Asthma   . Depression   . DJD (degenerative joint disease) of hip   . Former smoker   . GERD (gastroesophageal reflux disease)   . Hand pain, right   . Heart murmur   . Hiatal hernia   . History of kidney stones    history  . Hyperlipidemia   . Hypertension   . Kidney stone   . Osteoarthritis   . Pain, hip    right  . Pneumonia    hx but no hospitalization  . Reactive airway disease   . RLS (restless legs syndrome)   .  Routine gynecological examination    sees gynecology, [name] NP  . SOB (shortness of breath) 9/14   cardiac eval with stress test and echo; Dr. [name]  . Statin intolerance   . Vision problems     wears contact lenses    Past Surgical History:  Procedure Laterality Date  . bilateral hip replacement     nov 21st 2019 right, Jun 09 2018 left   . CARPAL TUNNEL RELEASE Left 07/03/2017   Procedure: LEFT CARPAL TUNNEL RELEASE;  Surgeon: Betha Loa, MD;  Location: [facility];  Service: Orthopedics;  Laterality: Left;  . COLONOSCOPY  06/2012   Dr. Ramon Dredge; repeat 10 years  . UPPER GI ENDOSCOPY N/A 07/24/2012   Hiatus Hernia,normal stomach, normal examined duodenum  . WISDOM TOOTH EXTRACTION      Social History   Socioeconomic History  . Marital status: Married    Spouse name: Maisie Fus  . Number of children: 1  . Years of education: 8th grade  . Highest education level: Not on file  Occupational History  . Occupation: Equities trader: UNEMPLOYED  Tobacco Use  . Smoking status: Former Smoker    Packs/day: 1.00    Years: 15.00    Pack years: 15.00    Types: Cigarettes    Quit date: 06/25/1991    Years since quitting: 28.2  . Smokeless tobacco: Never Used  Substance and Sexual Activity  . Alcohol use: Yes    Alcohol/week: 0.0 standard drinks    Comment: wine every 6 months or so  . Drug use: No  . Sexual activity: Yes    Birth control/protection: Post-menopausal  Other Topics Concern  . Not on file  Social History Narrative   11/12/18   From: [place]   Living: with Husband   Work: used to clean houses, but stopped due to pain      Family: one son, also raised 2 nephews - close with family - also has 2 grandchildren      Enjoys: outdoors activity, spending time in [place] house [place] acres[place]       Exercise: still does rehab exercises - working towards goals   Diet: working to try and lose [employer]            Social Determinants of Corporate investment banker Strain: Low Risk   . Difficulty of Paying Living Expenses: Not hard at all  Food Insecurity:   . Worried About Programme researcher, broadcasting/film/video in the Last Year:   . Barista in the Last Year:     Transportation Needs:   . Freight forwarder (Medical):   Marland Kitchen Lack of Transportation (Non-Medical):   Physical Activity:   . Days of Exercise per Week:   . Minutes of Exercise per Session:   Stress:   . Feeling of Stress :   Social Connections:   . Frequency of Communication with Friends and Family:   . Frequency of Social Gatherings with Friends and Family:   . Attends Religious Services:   . Active Member of Clubs or Organizations:   . Attends Banker Meetings:   Marland Kitchen Marital Status:   Intimate Partner Violence:   . Fear of Current or Ex-Partner:   . Emotionally Abused:   Marland Kitchen Physically Abused:   . Sexually Abused:     Family History  Problem Relation Age of Onset  . Arthritis Mother   . Asthma Mother   . Diabetes Mother   .  Cirrhosis Mother        died of liver nonalcoholic cirrhosis  . COPD Mother   . Heart disease Mother        CHF  . Hypertension Mother   . Kidney disease Mother   . Liver disease Mother   . Eating disorder Mother   . Obesity Mother   . Asthma Sister        doesnt know history well  . Drug abuse Sister   . Cancer Father        died of pancreatic cancer at age 70  . Alcohol abuse Father   . Depression Brother   . Alcohol abuse Brother   . Stroke Maternal Aunt        died age 48yo of CVA  . Other Brother        died of MVA  . Other Sister   . Drug abuse Sister        doesnt know history well  . Heart attack Maternal Grandfather 70    Current Facility-Administered Medications  Medication Dose Route Frequency Provider Last Rate Last Admin  . 0.9 %  sodium chloride infusion  250 mL Intravenous PRN [name], MD      . acetaminophen (TYLENOL) 160 MG/5ML solution 650 mg  650 mg Oral Q6H PRN Almond Lint, MD   650 mg at 10/02/19 1433  . acidophilus (RISAQUAD) capsule 1 capsule  1 capsule Oral Daily [name], MD   1 capsule at 10/03/19 0944  . albuterol (PROVENTIL) (2.5 MG/3ML) 0.083% nebulizer solution 2.5 mg  2.5 mg  Nebulization Q6H PRN [name], MD      . alum & mag hydroxide-simeth (MAALOX/MYLANTA) 200-200-20 MG/5ML suspension 30 mL  30 mL Oral Q6H PRN [name], MD   30 mL at 10/02/19 2126  . ascorbic acid (VITAMIN C) tablet 500 mg  500 mg Oral Daily [name], MD   500 mg at 10/03/19 0944  . bisacodyl (DULCOLAX) suppository 10 mg  10 mg Rectal Q12H PRN [name], MD      . diphenhydrAMINE (BENADRYL) injection 12.5-25 mg  12.5-25 mg Intravenous Q6H PRN [name], MD      . escitalopram (LEXAPRO) tablet 20 mg  20 mg Oral Daily [name], MD   20 mg at 10/03/19 0944  . ezetimibe (ZETIA) tablet 10 mg  10 mg Oral Daily [name], MD   10 mg at 10/03/19 0944  . fentaNYL (SUBLIMAZE) injection 12.5 mcg  12.5 mcg Intravenous Q1H PRN [name], MD   12.5 mcg at 10/02/19 2126  . ferrous sulfate tablet 325 mg  325 mg Oral Q breakfast [name], MD   325 mg at 10/03/19 4081  . fluticasone (FLONASE) 50 MCG/ACT nasal spray 1 spray  1 spray Each Nare Daily [name], MD      . fluticasone furoate-vilanterol (BREO ELLIPTA) 100-25 MCG/INH 1 puff  1 puff Inhalation Daily [name], MD      . guaiFENesin-dextromethorphan (ROBITUSSIN DM) 100-10 MG/5ML syrup 10 mL  10 mL Oral Q4H PRN [name], MD      . heparin injection 5,000 Units  5,000 Units Subcutaneous Q8H [name], MD   5,000 Units at 10/03/19 0647  . hydrocortisone (ANUSOL-HC) 2.5 % rectal cream 1 application  1 application Topical QID PRN [name], MD      . hydrocortisone cream 1 % 1 application  1 application Topical TID PRN [name], MD      .  lactated ringers bolus 1,000 mL  1,000 mL Intravenous Q8H PRN [name], MD      . lip balm (CARMEX) ointment 1 application  1 application Topical BID [name], MD   1 application at 10/03/19 0945  . losartan (COZAAR) tablet 50 mg  50 mg Oral Daily [name], MD   50 mg at 10/03/19 0944  . magic mouthwash  15 mL Oral QID PRN [name], MD       . melatonin tablet 5 mg  5 mg Oral QHS PRN [name], MD      . menthol-cetylpyridinium (CEPACOL) lozenge 3 mg  1 lozenge Oral PRN [name], MD      . methocarbamol (ROBAXIN) 1,000 mg in dextrose 5 % 100 mL IVPB  1,000 mg Intravenous Q6H PRN [name], MD      . metoCLOPramide (REGLAN) injection 5-10 mg  5-10 mg Intravenous Q8H PRN [name], MD      . metoprolol tartrate (LOPRESSOR) injection 5 mg  5 mg Intravenous Q6H PRN [name], MD      . montelukast (SINGULAIR) tablet 10 mg  10 mg Oral Laurena Slimmer, MD   10 mg at 10/02/19 2127  . multivitamin with minerals tablet 1 tablet  1 tablet Oral Daily [name], MD   1 tablet at 10/03/19 0944  . ondansetron (ZOFRAN-ODT) disintegrating tablet 4 mg  4 mg Oral Q6H PRN [name], MD       Or  . ondansetron [facility]) injection 4 mg  4 mg Intravenous Q6H PRN [name], MD      . pantoprazole (PROTONIX) EC tablet 80 mg  80 mg Oral Daily [name], MD   80 mg at 10/03/19 0944  . phenol (CHLORASEPTIC) mouth spray 1-2 spray  1-2 spray Mouth/Throat PRN [name], MD   2 spray at 10/03/19 0645  . prochlorperazine (COMPAZINE) injection 5-10 mg  5-10 mg Intravenous Q4H PRN [name], MD      . prochlorperazine (COMPAZINE) tablet 10 mg  10 mg Oral Q6H PRN [name], MD      . simethicone [facility]) 40 MG/0.6ML suspension 40 mg  40 mg Oral QID PRN [name], MD      . sodium chloride flush (NS) 0.9 % injection 3 mL  3 mL Intravenous Catha Gosselin, MD   3 mL at 10/03/19 0945  . sodium chloride flush (NS) 0.9 % injection 3 mL  3 mL Intravenous PRN [name], MD         Allergies  Allergen Reactions  . Statins     Myalgias with all statins  . Amoxicillin Hives and Rash  . Penicillins Hives, Other (See Comments) and Rash    Signed: Lorenso Courier, MD, FACS, MASCRS Gastrointestinal and Minimally Invasive Surgery  [facility] [address]  [address] [place], [place] [address] [phone] Main / Paging [phone] Fax     10/03/2019, 11:26 AM

## 2019-10-04 ENCOUNTER — Encounter: Payer: Self-pay | Admitting: *Deleted

## 2019-10-05 ENCOUNTER — Other Ambulatory Visit: Payer: Self-pay | Admitting: Family Medicine

## 2019-10-05 LAB — SURGICAL PATHOLOGY

## 2019-10-14 ENCOUNTER — Ambulatory Visit
Admission: EM | Admit: 2019-10-14 | Discharge: 2019-10-14 | Disposition: A | Discharge status: Home / Self Care | Payer: BC Managed Care – PPO | Attending: Emergency Medicine | Admitting: Emergency Medicine

## 2019-10-14 ENCOUNTER — Telehealth: Payer: Self-pay

## 2019-10-14 DIAGNOSIS — H9202 Otalgia, left ear: Secondary | ICD-10-CM | POA: Diagnosis not present

## 2019-10-14 DIAGNOSIS — R1013 Epigastric pain: Secondary | ICD-10-CM

## 2019-10-14 MED ORDER — NEOMYCIN-POLYMYXIN-HC 3.5-10000-1 OT SOLN: 3.0000 [drp] | Freq: Three times a day (TID) | OTIC | 0 refills | Status: DC

## 2019-10-14 NOTE — Discharge Instructions (Addendum)
Use eardrops 3 times daily for the next week. May take Tylenol for pain. EKG is reassuring in office today. Recommend going back to liquid diet x24 hours and observing closely for improvement. Please return tomorrow if symptoms persist or worsen as chest x-ray would be indicated at that time. Go to ER if you develop vomiting, particularly if there is blood, or you have blood in your stools or urine.

## 2019-10-14 NOTE — ED Triage Notes (Signed)
Pt states sent here from PCP. Pt states had hiatal hernia 2 wks ago. States had center chest pain after eating last night, just switched from liquid to soft foods. Pt c/o pain to lt neck shooting to lt ear since noon today. States had chest tightness when woke up this am.

## 2019-10-14 NOTE — Telephone Encounter (Signed)
Pt is in her car on her way home but pt is is [place] now. Now pt has sharp pain in lt side of neck that is radiating into the lt ear. Pain level 4-5 now; No S/T,fever and pt has not had this before. Earlier this morning when pt woke up pt had mid sharp CP that lasted about 30'. Pain level was 5 when had pain this morning in chest. Pain radiated to lt shoulder and arm.(pt had never had these symptoms before.) No SOB, dizziness or H/A but pt had SOB 10/13/19 after walking; pt said for the last 1 - 1 1/2 wks pt has been walking daily after a hiatal hernia surgery and yesterday was first time pt had SOB. Advised pt these symptoms could be lots of different things and pt is going to pass [facility] on [address] on her way home and pt will stop for eval. FYI to DR Selena Batten.

## 2019-10-14 NOTE — ED Provider Notes (Signed)
[name]    CSN: [id number] Arrival date & time: 10/14/19  1703      History   Chief Complaint Chief Complaint  Patient presents with  . Chest Pain    HPI Shannon Mckenzie is a 59 y.o. female with history of asthma, allergies, hypertension presenting for central chest pain.  Patient states this started last night after eating.  Had hiatal hernia surgery 2 weeks ago and just switched from liquid to soft foods.  Patient states that pain radiates to left neck and left ear since noon today.  Patient did have chest tightness when she woke up this morning, though feels it has improved throughout the day.  Denies nausea, vomiting, lower abdominal pain, hematochezia or melena, change in urinary habit.   Patient states that she noted left-sided neck and ear pain x yesterday.  States it has been windy the last few days and is concerned for an ear infection.  Denies change in hearing, tinnitus, foreign body sensation.  No fever, nasal congestion or sore throat.  Past Medical History:  Diagnosis Date  . Allergy   . Anemia   . Anxiety   . Aspiration pneumonia (HCC) 09/10/2019  . Asthma   . Depression   . DJD (degenerative joint disease) of hip   . Former smoker   . GERD (gastroesophageal reflux disease)   . Hand pain, right   . Heart murmur   . Hiatal hernia   . History of kidney stones    history  . Hyperlipidemia   . Hypertension   . Kidney stone   . Osteoarthritis   . Pain, hip    right  . Pneumonia    hx but no hospitalization  . Reactive airway disease   . RLS (restless legs syndrome)   . Routine gynecological examination    sees gynecology, [name] NP  . SOB (shortness of breath) 9/14   cardiac eval with stress test and echo; Dr. [name]  . Statin intolerance   . Vision problems    wears contact lenses    Patient Active Problem List   Diagnosis Date Noted  . Status post Nissen fundoplication 10/01/2019  . Asthma with status asthmaticus   .  Depression 09/10/2019  . Murmur 07/16/2018  . Hiatal hernia with GERD without esophagitis 01/10/2018  . Allergic rhinitis 05/26/2017  . Carpal tunnel syndrome 05/26/2017  . Former smoker 05/26/2017  . Major depression in full remission (HCC) 05/08/2017  . GAD (generalized anxiety disorder) 05/08/2017  . Iron deficiency 02/20/2016  . Osteoarthritis 03/13/2015  . Cough 03/13/2015  . Back pain 11/10/2014  . Rash and nonspecific skin eruption 07/21/2014  . Cold feeling 05/03/2014  . Sinusitis, chronic 06/14/2013  . Anemia 04/16/2013  . Extrinsic asthma, mild persistent, uncomplicated 03/20/2013  . Upper airway cough syndrome ? all related to sinus dz 03/19/2013  . Essential hypertension, benign 04/15/2012  . Obesity 04/15/2012  . Hyperlipidemia 04/15/2012    Past Surgical History:  Procedure Laterality Date  . bilateral hip replacement     nov 21st 2019 right, Jun 09 2018 left   . CARPAL TUNNEL RELEASE Left 07/03/2017   Procedure: LEFT CARPAL TUNNEL RELEASE;  Surgeon: Betha Loa, MD;  Location: [facility];  Service: Orthopedics;  Laterality: Left;  . COLONOSCOPY  06/2012   Dr. Ramon Dredge; repeat 10 years  . HIATAL HERNIA REPAIR N/A 10/01/2019   Procedure: LAPAROSCOPIC REPAIR OF HIATAL HERNIA WITH NISSEN FUNDOPLICATION, UPPER ENDO, REMOVAL OF GASTRIC  MASS;  Surgeon: [name], MD;  Location: [facility];  Service: General;  Laterality: N/A;  . UPPER GI ENDOSCOPY N/A 07/24/2012   Hiatus Hernia,normal stomach, normal examined duodenum  . WISDOM TOOTH EXTRACTION      OB History    Gravida  1   Para  1   Term      Preterm      AB      Living        SAB      TAB      Ectopic      Multiple      Live Births               Home Medications    Prior to Admission medications   Medication Sig Start Date End Date Taking? Authorizing Provider  acidophilus (RISAQUAD) CAPS capsule Take 1 capsule by mouth in the morning and at bedtime.    [provider]  albuterol (PROVENTIL HFA;VENTOLIN HFA) 108 (90 Base) MCG/ACT inhaler Inhale 2 puffs into the lungs every 6 (six) hours as needed for wheezing or shortness of breath. 04/29/18   [name], PA  albuterol (PROVENTIL) (2.5 MG/3ML) 0.083% nebulizer solution Take 3 mLs (2.5 mg total) by nebulization every 6 (six) hours as needed for wheezing or shortness of breath. 11/12/18   [name], MD  ascorbic acid (VITAMIN C) 500 MG tablet Take 500 mg by mouth daily.    [provider]  BREO ELLIPTA 100-25 MCG/INH AEPB INHALE 1 PUFF INTO THE LUNGS DAILY Patient taking differently: Inhale 1 puff into the lungs daily.  06/02/19   [name], MD  cholecalciferol (VITAMIN D3) 25 MCG (1000 UT) tablet Take 1,000 Units by mouth daily.    [provider]  escitalopram (LEXAPRO) 20 MG tablet TAKE 1 TABLET BY MOUTH EVERY DAY Patient taking differently: Take 20 mg by mouth daily.  05/11/19   [name], MD  esomeprazole (NEXIUM) 40 MG capsule Take 1 capsule (40 mg total) by mouth 2 (two) times daily. 03/05/19   [name], MD  ezetimibe (ZETIA) 10 MG tablet TAKE 1 TABLET BY MOUTH EVERY DAY Patient taking differently: Take 10 mg by mouth daily.  07/07/19   [name], MD  ferrous sulfate 325 (65 FE) MG tablet Take 325 mg by mouth daily with breakfast.    [provider]  fluticasone (FLONASE) 50 MCG/ACT nasal spray SHAKE LIQUID AND USE 1 SPRAY IN EACH NOSTRIL DAILY Patient taking differently: Place 1 spray into both nostrils daily.  06/04/19   [name], MD  HYDROcodone-acetaminophen (NORCO/VICODIN) 5-325 MG tablet Take 1-2 tablets by mouth every 6 (six) hours as needed for moderate pain or severe pain. 10/03/19   [name], MD  losartan (COZAAR) 50 MG tablet TAKE 1 TABLET(50 MG) BY MOUTH DAILY Patient taking differently: Take 50 mg by mouth daily.  04/15/19   [name], MD  melatonin 5 MG TABS Take 5 mg by mouth at bedtime as needed  (sleep).    [provider]  montelukast (SINGULAIR) 10 MG tablet TAKE 1 TABLET(10 MG) BY MOUTH AT BEDTIME 10/05/19   [name], MD  Multiple Vitamin (MULTIVITAMIN WITH MINERALS) TABS tablet Take 1 tablet by mouth daily.    [provider]  neomycin-polymyxin-hydrocortisone (CORTISPORIN) OTIC solution Place 3 drops into the left ear 3 (three) times daily. 10/14/19   Hall-Potvin, [place], PA-C  ondansetron (ZOFRAN) 4 MG tablet Take 1  tablet (4 mg total) by mouth every 8 (eight) hours as needed for nausea. 10/03/19   Karie SodaGross, Steven, MD  scopolamine (TRANSDERM-SCOP, 1.5 MG,) 1 MG/3DAYS Place 1 patch (1.5 mg total) onto the skin every 3 (three) days. 10/03/19 10/02/20  Karie SodaGross, Steven, MD    Family History Family History  Problem Relation Age of Onset  . Arthritis Mother   . Asthma Mother   . Diabetes Mother   . Cirrhosis Mother        died of liver nonalcoholic cirrhosis  . COPD Mother   . Heart disease Mother        CHF  . Hypertension Mother   . Kidney disease Mother   . Liver disease Mother   . Eating disorder Mother   . Obesity Mother   . Asthma Sister        doesnt know history well  . Drug abuse Sister   . Cancer Father        died of pancreatic cancer at age 58  . Alcohol abuse Father   . Depression Brother   . Alcohol abuse Brother   . Stroke Maternal Aunt        died age 58yo of CVA  . Other Brother        died of MVA  . Other Sister   . Drug abuse Sister        doesnt know history well  . Heart attack Maternal Grandfather 970    Social History Social History   Tobacco Use  . Smoking status: Former Smoker    Packs/day: 1.00    Years: 15.00    Pack years: 15.00    Types: Cigarettes    Quit date: 06/25/1991    Years since quitting: 28.3  . Smokeless tobacco: Never Used  Substance Use Topics  . Alcohol use: Yes    Alcohol/week: 0.0 standard drinks    Comment: wine every 6 months or so  . Drug use: No     Allergies   Statins, Amoxicillin,  and Penicillins   Review of Systems As per HPI   Physical Exam Triage Vital Signs ED Triage Vitals  Enc Vitals Group     BP      Pulse      Resp      Temp      Temp src      SpO2      Weight      Height      Head Circumference      Peak Flow      Pain Score      Pain Loc      Pain Edu?      Excl. in [place]?    No data found.  Updated Vital Signs BP 126/77 (BP Location: Left Arm)   Pulse 73   Temp 98.3 F (36.8 C) (Oral)   Resp 18   LMP 03/13/2015   SpO2 97%   Visual Acuity Right Eye Distance:   Left Eye Distance:   Bilateral Distance:    Right Eye Near:   Left Eye Near:    Bilateral Near:     Physical Exam Constitutional:      General: She is not in acute distress.    Appearance: She is well-developed and normal weight. She is not ill-appearing.  HENT:     Head: Normocephalic and atraumatic.     Right Ear: Tympanic membrane, ear canal and external ear normal.     Left Ear: Tympanic  membrane and external ear normal.     Ears:     Comments: Negative tragal tenderness bilaterally. Left ear with mild EAC swelling and erythema.  Patient endorsing discomfort with otoscope. Eyes:     General: No scleral icterus.    Pupils: Pupils are equal, round, and reactive to light.  Neck:     Vascular: No JVD.     Trachea: No tracheal deviation.  Cardiovascular:     Rate and Rhythm: Normal rate and regular rhythm.     Heart sounds: Normal heart sounds.  Pulmonary:     Effort: Pulmonary effort is normal. No tachypnea, accessory muscle usage or respiratory distress.     Breath sounds: Normal breath sounds. No stridor.  Abdominal:     Palpations: Abdomen is soft.     Tenderness: There is no abdominal tenderness.     Comments: 6 laparoscopic incisional scars healing well.  No erythema or discharge.  Lymphadenopathy:     Cervical: No cervical adenopathy.  Skin:    Coloration: Skin is not jaundiced or pale.  Neurological:     Mental Status: She is alert and oriented  to person, place, and time.      UC Treatments / Results  Labs (all labs ordered are listed, but only abnormal results are displayed) Labs Reviewed - No data to display  EKG   Radiology No results found.  Procedures Procedures (including critical care time)  Medications Ordered in UC Medications - No data to display  Initial Impression / Assessment and Plan / UC Course  I have reviewed the triage vital signs and the nursing notes.  Pertinent labs & imaging results that were available during my care of the patient were reviewed by me and considered in my medical decision making (see chart for details).     Patient afebrile, nontoxic in office today.  No evidence of airway compromise.  EKG done in office, reviewed by me and compared to previous from 09/13/2019: Normal sinus rhythm with ventricular rate of 72 bpm.  No QTC prolongation, ST elevation or depression.  Mild T wave inversions in leads III and V1 as compared to previous, though without reciprocal changes.  Nonacute EKG.  Reviewed findings with patient verbalized understanding.  Likely epigastric discomfort second to healing surgical wound and recent change to soft foods.  Heartburn could be contributory.  Low concern for wound dehiscence is at her wounds are healing well and patient is relatively asymptomatic in office.  Discussed utility of chest x-ray: We will defer for now, though pursue if patient has persistent or worsening symptoms.  Patient to keep follow-ups with her surgeon as well as PCP.  Left ear with some EAC swelling and tenderness: We will cover with Cortisporin.  Return precautions discussed, patient verbalized understanding and is agreeable to plan. Final Clinical Impressions(s) / UC Diagnoses   Final diagnoses:  Abdominal pain, epigastric  Left ear pain     Discharge Instructions     Use eardrops 3 times daily for the next week. May take Tylenol for pain. EKG is reassuring in office today. Recommend  going back to liquid diet x24 hours and observing closely for improvement. Please return tomorrow if symptoms persist or worsen as chest x-ray would be indicated at that time. Go to ER if you develop vomiting, particularly if there is blood, or you have blood in your stools or urine.    ED Prescriptions    Medication Sig Dispense Auth. Provider   neomycin-polymyxin-hydrocortisone (CORTISPORIN) OTIC solution  Place 3 drops into the left ear 3 (three) times daily. 10 mL Hall-Potvin, [place], PA-C     PDMP not reviewed this encounter.   Hall-Potvin, [place], [place] 10/14/19 1847

## 2019-10-15 NOTE — Telephone Encounter (Signed)
Agree with plan for evaluation

## 2019-10-28 ENCOUNTER — Other Ambulatory Visit: Payer: Self-pay | Admitting: Family Medicine

## 2019-11-30 ENCOUNTER — Other Ambulatory Visit: Payer: Self-pay | Admitting: Family Medicine

## 2019-11-30 DIAGNOSIS — R32 Unspecified urinary incontinence: Secondary | ICD-10-CM | POA: Insufficient documentation

## 2019-11-30 DIAGNOSIS — N951 Menopausal and female climacteric states: Secondary | ICD-10-CM | POA: Insufficient documentation

## 2019-12-12 ENCOUNTER — Ambulatory Visit
Admission: EM | Admit: 2019-12-12 | Discharge: 2019-12-12 | Disposition: A | Discharge status: Home / Self Care | Payer: BC Managed Care – PPO | Attending: Emergency Medicine | Admitting: Emergency Medicine

## 2019-12-12 ENCOUNTER — Other Ambulatory Visit: Payer: Self-pay

## 2019-12-12 ENCOUNTER — Encounter: Payer: Self-pay | Admitting: Emergency Medicine

## 2019-12-12 DIAGNOSIS — R05 Cough: Secondary | ICD-10-CM | POA: Diagnosis not present

## 2019-12-12 DIAGNOSIS — R059 Cough, unspecified: Secondary | ICD-10-CM

## 2019-12-12 DIAGNOSIS — Z20828 Contact with and (suspected) exposure to other viral communicable diseases: Secondary | ICD-10-CM | POA: Diagnosis not present

## 2019-12-12 DIAGNOSIS — J209 Acute bronchitis, unspecified: Secondary | ICD-10-CM

## 2019-12-12 MED ORDER — PREDNISONE 20 MG PO TABS: 20.0000 mg | ORAL_TABLET | Freq: Every day | ORAL | 0 refills | Status: DC

## 2019-12-12 MED ORDER — DOXYCYCLINE HYCLATE 100 MG PO CAPS: 100.0000 mg | ORAL_CAPSULE | Freq: Two times a day (BID) | ORAL | 0 refills | Status: AC

## 2019-12-12 MED ORDER — BENZONATATE 100 MG PO CAPS: 100.0000 mg | ORAL_CAPSULE | Freq: Three times a day (TID) | ORAL | 0 refills | Status: DC

## 2019-12-12 NOTE — Discharge Instructions (Signed)

## 2019-12-12 NOTE — ED Provider Notes (Signed)
[name]    CSN: [id number] Arrival date & time: 12/12/19  1136      History   Chief Complaint Chief Complaint  Patient presents with  . Cough    HPI Shannon Mckenzie is a 59 y.o. female with history of reactive airway disease, OA, former smoker, asthma, allergies presenting for 4-day course of URI symptoms.  Patient endorsing cough, congestion that was preceded by rhinorrhea and nasal congestion.  Patient denying fever, arthralgias, myalgias.  Received [employer] vaccine mid March 2021: Tolerated this well.  No known sick contacts.  Does endorse hospitalization within the last year due to pneumonia.  Has albuterol inhaler and nebulizer solution, though has not used these yet due to lack of dyspnea.  Denies chest pain, abdominal pain, vomiting.   Past Medical History:  Diagnosis Date  . Allergy   . Anemia   . Anxiety   . Aspiration pneumonia (HCC) 09/10/2019  . Asthma   . Depression   . DJD (degenerative joint disease) of hip   . Former smoker   . GERD (gastroesophageal reflux disease)   . Hand pain, right   . Heart murmur   . Hiatal hernia   . History of kidney stones    history  . Hyperlipidemia   . Hypertension   . Kidney stone   . Osteoarthritis   . Pain, hip    right  . Pneumonia    hx but no hospitalization  . Reactive airway disease   . RLS (restless legs syndrome)   . Routine gynecological examination    sees gynecology, Shannon Mckenzie  . SOB (shortness of breath) 9/14   cardiac eval with stress test and echo; Dr. [name]  . Statin intolerance   . Vision problems    wears contact lenses    Patient Active Problem List   Diagnosis Date Noted  . Menopausal symptom 11/30/2019  . Urinary incontinence 11/30/2019  . Status post Nissen fundoplication 10/01/2019  . Asthma with status asthmaticus   . Depression 09/10/2019  . Murmur 07/16/2018  . Hiatal hernia with GERD without esophagitis 01/10/2018  . Allergic rhinitis 05/26/2017    . Carpal tunnel syndrome 05/26/2017  . Former smoker 05/26/2017  . Major depression in full remission (HCC) 05/08/2017  . GAD (generalized anxiety disorder) 05/08/2017  . Iron deficiency 02/20/2016  . Osteoarthritis 03/13/2015  . Cough 03/13/2015  . Back pain 11/10/2014  . Rash and nonspecific skin eruption 07/21/2014  . Cold feeling 05/03/2014  . Sinusitis, chronic 06/14/2013  . Anemia 04/16/2013  . Extrinsic asthma, mild persistent, uncomplicated 03/20/2013  . Upper airway cough syndrome ? all related to sinus dz 03/19/2013  . Essential hypertension, benign 04/15/2012  . Obesity 04/15/2012  . Hyperlipidemia 04/15/2012    Past Surgical History:  Procedure Laterality Date  . bilateral hip replacement     nov 21st 2019 right, Jun 09 2018 left   . CARPAL TUNNEL RELEASE Left 07/03/2017   Procedure: LEFT CARPAL TUNNEL RELEASE;  Surgeon: Betha Loa, MD;  Location: [facility];  Service: Orthopedics;  Laterality: Left;  . COLONOSCOPY  06/2012   Dr. Ramon Dredge; repeat 10 years  . HIATAL HERNIA REPAIR N/A 10/01/2019   Procedure: LAPAROSCOPIC REPAIR OF HIATAL HERNIA WITH NISSEN FUNDOPLICATION, UPPER ENDO, REMOVAL OF GASTRIC MASS;  Surgeon: [name], MD;  Location: [facility];  Service: General;  Laterality: N/A;  . UPPER GI ENDOSCOPY N/A 07/24/2012   Hiatus Hernia,normal stomach, normal examined duodenum  .  WISDOM TOOTH EXTRACTION      OB History    Gravida  1   Para  1   Term      Preterm      AB      Living        SAB      TAB      Ectopic      Multiple      Live Births               Home Medications    Prior to Admission medications   Medication Sig Start Date End Date Taking? Authorizing Provider  fluticasone furoate-vilanterol (BREO ELLIPTA) 100-25 MCG/INH AEPB Inhale 1 puff into the lungs daily. 11/30/19   [name], MD  acidophilus (RISAQUAD) CAPS capsule Take 1 capsule by mouth in the morning and at bedtime.    [provider]  albuterol (PROVENTIL HFA;VENTOLIN HFA) 108 (90 Base) MCG/ACT inhaler Inhale 2 puffs into the lungs every 6 (six) hours as needed for wheezing or shortness of breath. 04/29/18   [name], PA  albuterol (PROVENTIL) (2.5 MG/3ML) 0.083% nebulizer solution Take 3 mLs (2.5 mg total) by nebulization every 6 (six) hours as needed for wheezing or shortness of breath. 11/12/18   [name], MD  ascorbic acid (VITAMIN C) 500 MG tablet Take 500 mg by mouth daily.    [provider]  benzonatate (TESSALON) 100 MG capsule Take 1 capsule (100 mg total) by mouth every 8 (eight) hours. 12/12/19   Hall-Potvin, [place], PA-C  cholecalciferol (VITAMIN D3) 25 MCG (1000 UT) tablet Take 1,000 Units by mouth daily.    [provider]  doxycycline (VIBRAMYCIN) 100 MG capsule Take 1 capsule (100 mg total) by mouth 2 (two) times daily for 5 days. 12/12/19 12/17/19  Hall-Potvin, [place], PA-C  escitalopram (LEXAPRO) 20 MG tablet TAKE 1 TABLET BY MOUTH EVERY DAY Patient taking differently: Take 20 mg by mouth daily.  05/11/19   [name], MD  esomeprazole (NEXIUM) 40 MG capsule Take 1 capsule (40 mg total) by mouth 2 (two) times daily. 03/05/19   [name], MD  ezetimibe (ZETIA) 10 MG tablet TAKE 1 TABLET BY MOUTH EVERY DAY Patient taking differently: Take 10 mg by mouth daily.  07/07/19   [name], MD  ferrous sulfate 325 (65 FE) MG tablet Take 325 mg by mouth daily with breakfast.    [provider]  fluticasone (FLONASE) 50 MCG/ACT nasal spray Place 1 spray into both nostrils daily. 10/28/19   [name], MD  HYDROcodone-acetaminophen (NORCO/VICODIN) 5-325 MG tablet Take 1-2 tablets by mouth every 6 (six) hours as needed for moderate pain or severe pain. Patient not taking: Reported on 12/12/2019 10/03/19   [name], MD  losartan (COZAAR) 50 MG tablet TAKE 1 TABLET(50 MG) BY MOUTH DAILY Patient taking differently: Take 50 mg by mouth daily.  04/15/19   [name], MD  melatonin 5 MG TABS Take 5 mg by mouth at bedtime as needed (sleep).    [provider]  montelukast (SINGULAIR) 10 MG tablet TAKE 1 TABLET(10 MG) BY MOUTH AT BEDTIME 10/05/19   [name], MD  Multiple Vitamin (MULTIVITAMIN WITH MINERALS) TABS tablet Take 1 tablet by mouth daily.    [provider]  neomycin-polymyxin-hydrocortisone (CORTISPORIN) OTIC solution Place 3 drops into the left ear 3 (three) times daily. 10/14/19   Hall-Potvin, [place], PA-C  ondansetron (ZOFRAN) 4 MG tablet Take  1 tablet (4 mg total) by mouth every 8 (eight) hours as needed for nausea. 10/03/19   Michael Boston, MD  predniSONE (DELTASONE) 20 MG tablet Take 1 tablet (20 mg total) by mouth daily. 12/12/19   Hall-Potvin, [place], PA-C  scopolamine (TRANSDERM-SCOP, 1.5 MG,) 1 MG/3DAYS Place 1 patch (1.5 mg total) onto the skin every 3 (three) days. 10/03/19 10/02/20  Michael Boston, MD    Family History Family History  Problem Relation Age of Onset  . Arthritis Mother   . Asthma Mother   . Diabetes Mother   . Cirrhosis Mother        died of liver nonalcoholic cirrhosis  . COPD Mother   . Heart disease Mother        CHF  . Hypertension Mother   . Kidney disease Mother   . Liver disease Mother   . Eating disorder Mother   . Obesity Mother   . Asthma Sister        doesnt know history well  . Drug abuse Sister   . Cancer Father        died of pancreatic cancer at age 77  . Alcohol abuse Father   . Depression Brother   . Alcohol abuse Brother   . Stroke Maternal Aunt        died age 63yo of CVA  . Other Brother        died of MVA  . Other Sister   . Drug abuse Sister        doesnt know history well  . Heart attack Maternal Grandfather 70    Social History Social History   Tobacco Use  . Smoking status: Former Smoker    Packs/day: 1.00    Years: 15.00    Pack years: 15.00    Types: Cigarettes    Quit date: 06/25/1991    Years since quitting: 28.4  . Smokeless  tobacco: Never Used  Vaping Use  . Vaping Use: Never used  Substance Use Topics  . Alcohol use: Yes    Alcohol/week: 0.0 standard drinks    Comment: wine every 6 months or so  . Drug use: No     Allergies   Statins, Amoxicillin, and Penicillins   Review of Systems As per HPI   Physical Exam Triage Vital Signs ED Triage Vitals  Enc Vitals Group     BP      Pulse      Resp      Temp      Temp src      SpO2      Weight      Height      Head Circumference      Peak Flow      Pain Score      Pain Loc      Pain Edu?      Excl. in [place]?    No data found.  Updated Vital Signs BP 135/86 (BP Location: Left Arm)   Pulse 79   Temp 97.9 F (36.6 C) (Oral)   Resp 18   LMP 03/13/2015   SpO2 95%   Visual Acuity Right Eye Distance:   Left Eye Distance:   Bilateral Distance:    Right Eye Near:   Left Eye Near:    Bilateral Near:     Physical Exam Constitutional:      General: She is not in acute distress.    Appearance: She is obese. She is not ill-appearing or diaphoretic.  HENT:     Head: Normocephalic and atraumatic.     Mouth/Throat:     Mouth: Mucous membranes are moist.     Pharynx: Oropharynx is clear. No oropharyngeal exudate or posterior oropharyngeal erythema.  Eyes:     General: No scleral icterus.    Conjunctiva/sclera: Conjunctivae normal.     Pupils: Pupils are equal, round, and reactive to light.  Neck:     Comments: Trachea midline, negative JVD Cardiovascular:     Rate and Rhythm: Normal rate and regular rhythm.     Heart sounds: No murmur heard.  No gallop.   Pulmonary:     Effort: Pulmonary effort is normal. No respiratory distress.     Breath sounds: Wheezing present. No rhonchi or rales.     Comments: Diffuse, mild Musculoskeletal:     Cervical back: Neck supple. No tenderness.  Lymphadenopathy:     Cervical: No cervical adenopathy.  Skin:    Capillary Refill: Capillary refill takes less than 2 seconds.     Coloration: Skin is  not jaundiced or pale.     Findings: No rash.  Neurological:     General: No focal deficit present.     Mental Status: She is alert and oriented to person, place, and time.      UC Treatments / Results  Labs (all labs ordered are listed, but only abnormal results are displayed) Labs Reviewed  NOVEL CORONAVIRUS, NAA    EKG   Radiology No results found.  Procedures Procedures (including critical care time)  Medications Ordered in UC Medications - No data to display  Initial Impression / Assessment and Plan / UC Course  I have reviewed the triage vital signs and the nursing notes.  Pertinent labs & imaging results that were available during my care of the patient were reviewed by me and considered in my medical decision making (see chart for details).     Patient afebrile, nontoxic, with SpO2 95%.  Covid PCR pending.  Patient to quarantine until results are back.  We will treat supportively as outlined below.  Doxycycline to cover for bacterial component given history of hospitalization secondary pneumonia.  Return precautions discussed, patient verbalized understanding and is agreeable to plan. Final Clinical Impressions(s) / UC Diagnoses   Final diagnoses:  Cough  Acute bronchitis, unspecified organism     Discharge Instructions     Tessalon for cough. Start flonase, atrovent nasal spray for nasal congestion/drainage. You can use over the counter nasal saline rinse such as neti pot for nasal congestion. Keep hydrated, your urine should be clear to pale yellow in color. Tylenol/motrin for fever and pain. Monitor for any worsening of symptoms, chest pain, shortness of breath, wheezing, swelling of the throat, go to the emergency department for further evaluation needed.     ED Prescriptions    Medication Sig Dispense Auth. Provider   doxycycline (VIBRAMYCIN) 100 MG capsule Take 1 capsule (100 mg total) by mouth 2 (two) times daily for 5 days. 10 capsule Hall-Potvin,  [place], PA-C   predniSONE (DELTASONE) 20 MG tablet Take 1 tablet (20 mg total) by mouth daily. 5 tablet Hall-Potvin, [place], PA-C   benzonatate (TESSALON) 100 MG capsule Take 1 capsule (100 mg total) by mouth every 8 (eight) hours. 21 capsule Hall-Potvin, [place], PA-C     PDMP not reviewed this encounter.   Hall-Potvin, [place], [place] 12/12/19 1253

## 2019-12-12 NOTE — ED Triage Notes (Signed)
Pt here with cough and congestion with body aches x 4 days

## 2019-12-13 LAB — NOVEL CORONAVIRUS, NAA: SARS-CoV-2, NAA: NOT DETECTED

## 2019-12-13 LAB — SARS-COV-2, NAA 2 DAY TAT

## 2020-01-03 ENCOUNTER — Other Ambulatory Visit: Payer: Self-pay | Admitting: Family Medicine

## 2020-01-03 NOTE — Telephone Encounter (Signed)
Last office visit 09/14/2019 for Hospital follow up. Last refilled 07/07/2019 for #90 with 1 refill.  Last Lipid 04/27/2018.  No future appointments. Ok to refill?

## 2020-01-13 IMAGING — CT CT ANGIO CHEST
2 of 6 series · 18 of 46 positions shown · IV contrast (APPLIED)
Comparison: Chest x-ray from same day.

CLINICAL DATA: Shortness of breath since [REDACTED].

EXAM:
CT ANGIOGRAPHY CHEST WITH CONTRAST
TECHNIQUE: Multidetector CT imaging of the chest was performed using the
standard protocol during bolus administration of intravenous
contrast. Multiplanar CT image reconstructions and MIPs were
obtained to evaluate the vascular anatomy.
CONTRAST:  75mL OMNIPAQUE IOHEXOL 350 MG/ML SOLN

[Series 5: thins · axial · 0.73mm/px · z∈[-633,-383]mm · 16 of 274 slices shown]
[im 12/274  lung]
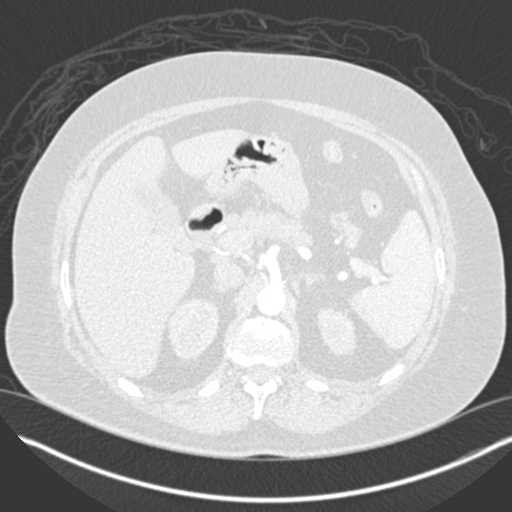
[im 36/274  soft-tissue]
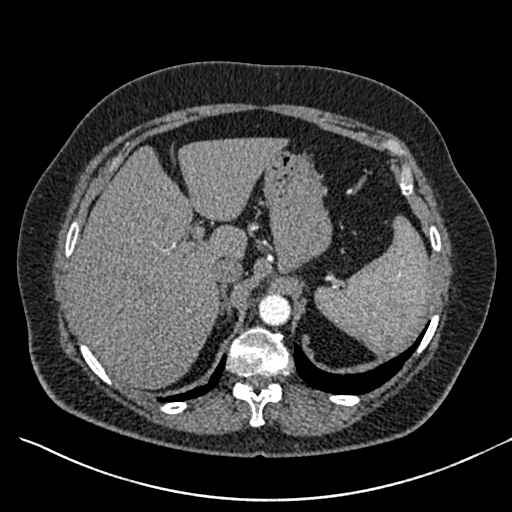
[im 48/274  lung]
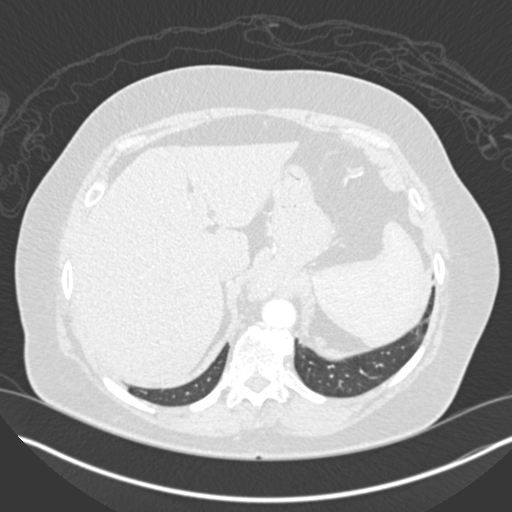
[im 60/274  soft-tissue]
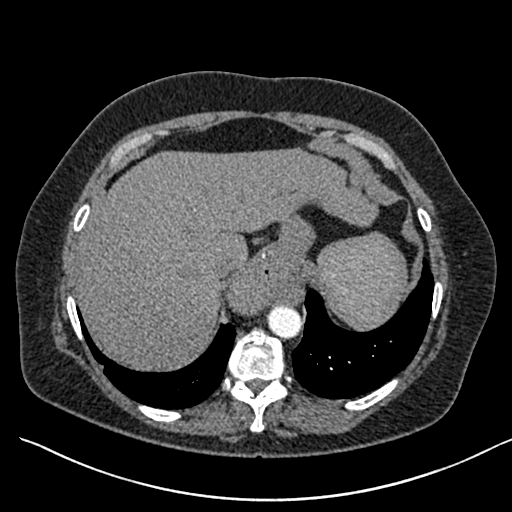
[im 84/274  lung]
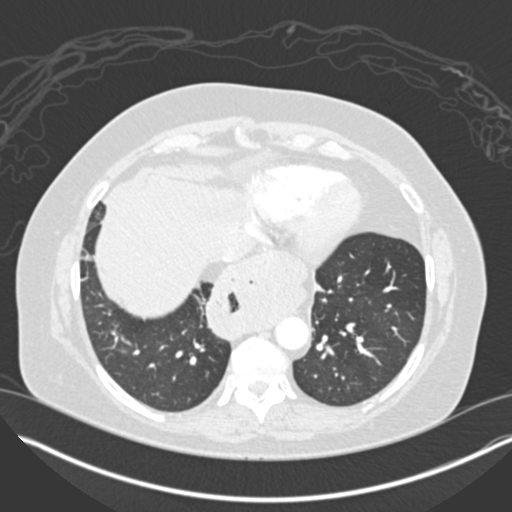
[im 95/274  soft-tissue]
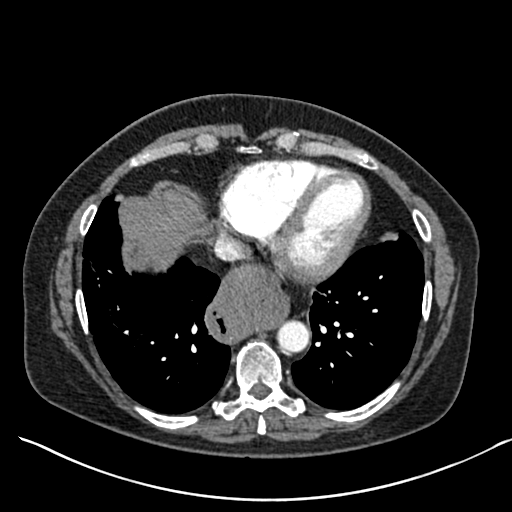
[im 107/274  lung]
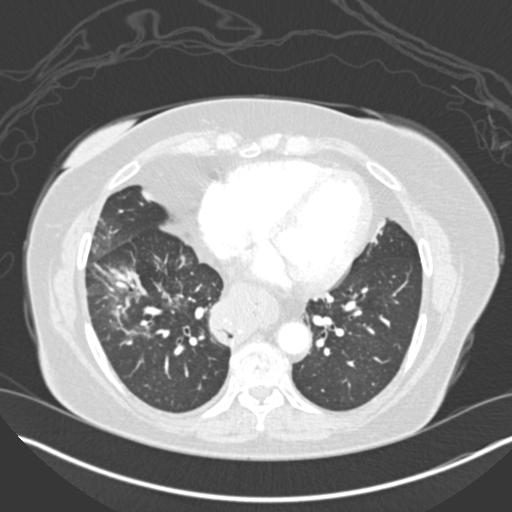
[im 131/274  soft-tissue]
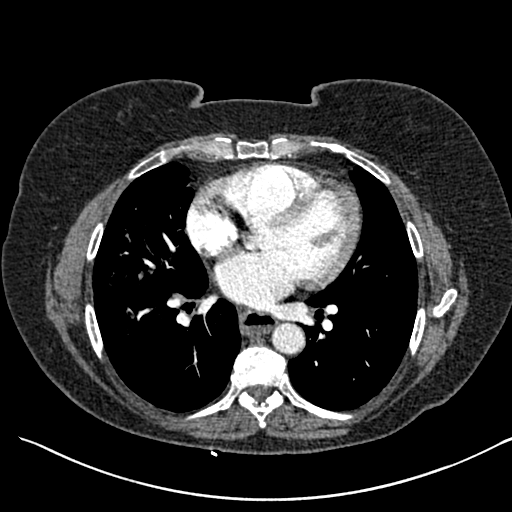
[im 143/274  lung]
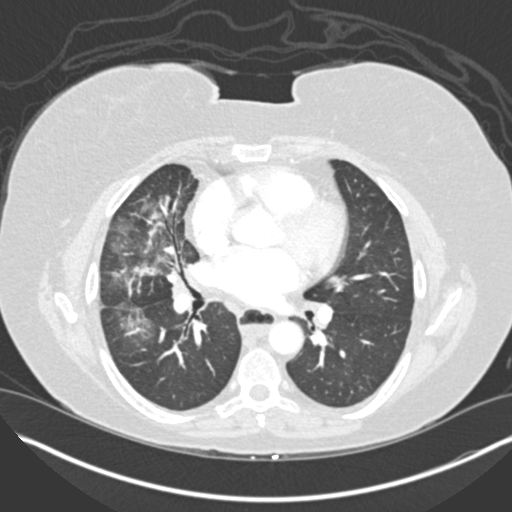
[im 167/274  soft-tissue]
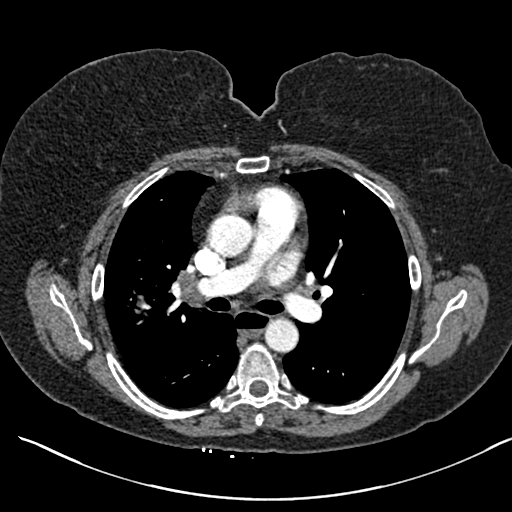
[im 179/274  lung]
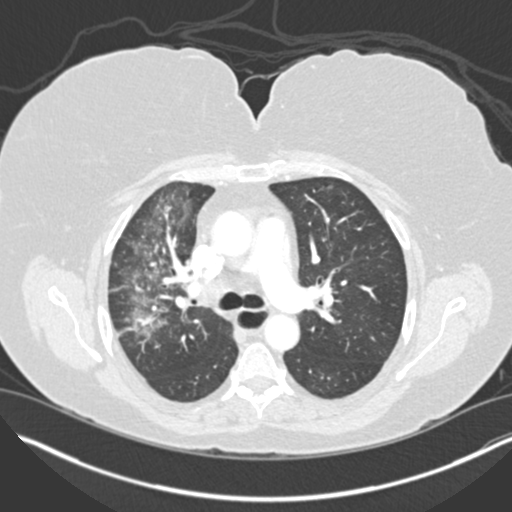
[im 190/274  soft-tissue]
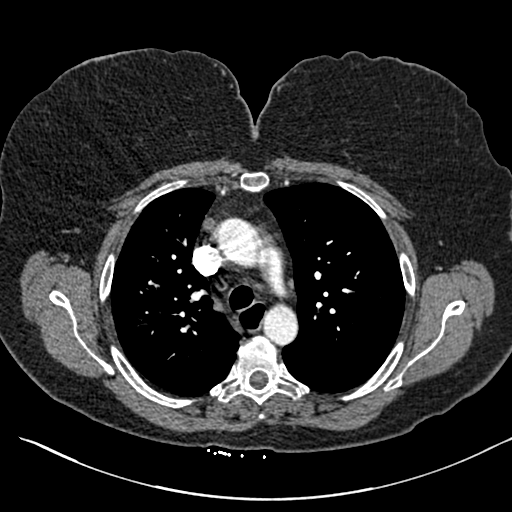
[im 214/274  lung]
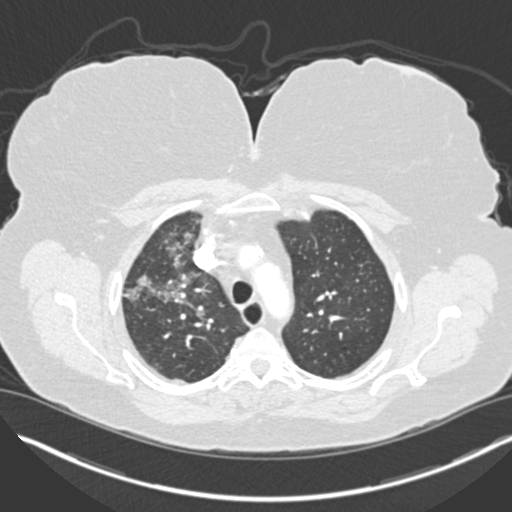
[im 226/274  soft-tissue]
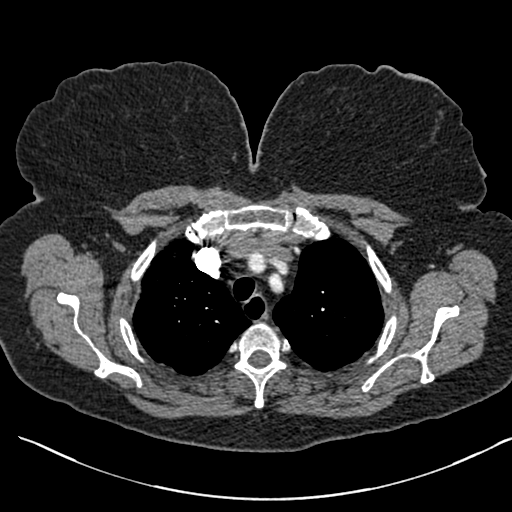
[im 238/274  lung]
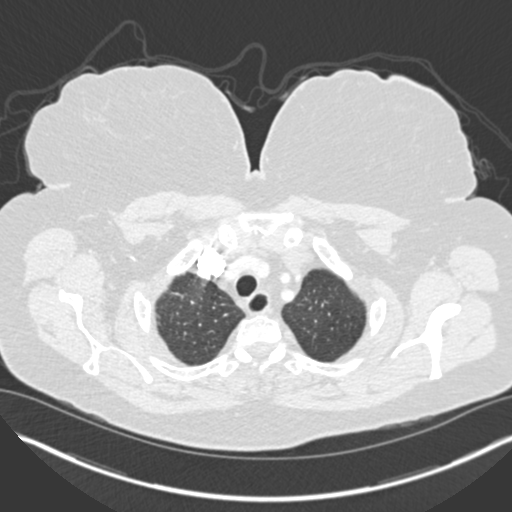
[im 262/274  soft-tissue]
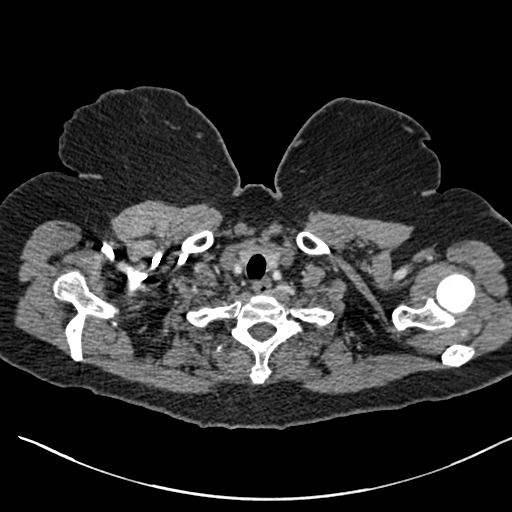

[Series 7: coronal mpr · coronal · 0.54mm/px · 2 of 86 slices shown]
[im 29/86  soft-tissue]
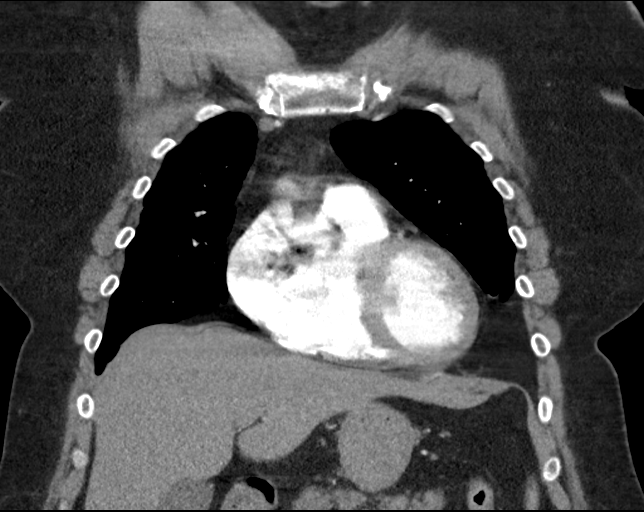
[im 57/86  soft-tissue]
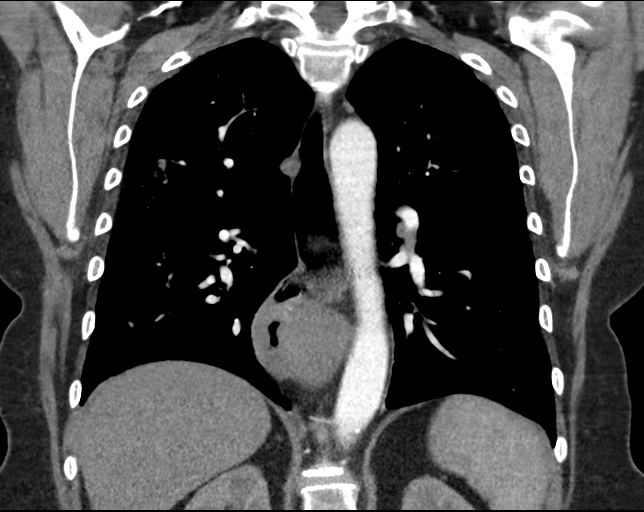

[18 of 46 positions shown; findings below may reference images not displayed]

FINDINGS: Cardiovascular: Satisfactory opacification of the pulmonary arteries
to the segmental level. No evidence of pulmonary embolism. The heart
is at the upper limits of normal in size. No pericardial effusion.
No thoracic aortic aneurysm or dissection.

Mediastinum/Nodes: Mildly enlarged right hilar lymph node measuring
1.4 cm in short axis, likely reactive. No enlarged mediastinal or
axillary lymph nodes. The thyroid gland and trachea demonstrate no
significant findings. Patulous esophagus. Large hiatal hernia.

Lungs/Pleura: Patchy peribronchovascular ground-glass densities and
nodular consolidation in the right upper, middle, and lower lobes.
Some areas have a more rounded appearance, for example in the right
lower lobe (series 6, images 46-47). No pleural effusion or
pneumothorax.

Upper Abdomen: No acute abnormality.

Musculoskeletal: No chest wall abnormality. No acute or significant
osseous findings.

Review of the MIP images confirms the above findings.
IMPRESSION: 1. Patchy peribronchovascular ground-glass densities and nodular
consolidation involving all lobes of the right lung, consistent with
multilobar pneumonia. There are a spectrum of findings in the lungs
which can be seen with acute atypical infection (as well as other
non-infectious etiologies). In particular, viral pneumonia
(including NEJIL-EN) should be considered in the appropriate
clinical setting.
2.  No evidence of pulmonary embolism.
3. Large hiatal hernia.

## 2020-02-02 ENCOUNTER — Other Ambulatory Visit: Payer: Self-pay | Admitting: Family Medicine

## 2020-02-02 DIAGNOSIS — F325 Major depressive disorder, single episode, in full remission: Secondary | ICD-10-CM

## 2020-02-02 DIAGNOSIS — F411 Generalized anxiety disorder: Secondary | ICD-10-CM

## 2020-02-07 ENCOUNTER — Ambulatory Visit
Admission: EM | Admit: 2020-02-07 | Discharge: 2020-02-07 | Disposition: A | Discharge status: Home / Self Care | Payer: BC Managed Care – PPO | Attending: Physician Assistant | Admitting: Physician Assistant

## 2020-02-07 DIAGNOSIS — M545 Low back pain, unspecified: Secondary | ICD-10-CM

## 2020-02-07 MED ORDER — KETOROLAC TROMETHAMINE 15 MG/ML IJ SOLN
15.0000 mg | Freq: Once | INTRAMUSCULAR | Status: AC
  Administered 2020-02-07: 15 mg via INTRAMUSCULAR

## 2020-02-07 MED ORDER — PREDNISONE 10 MG (21) PO TBPK
ORAL_TABLET | Freq: Every day | ORAL | 0 refills | Status: DC
Start: 2020-02-07 — End: 2020-07-25

## 2020-02-07 MED ORDER — METHOCARBAMOL 500 MG PO TABS: 500.0000 mg | ORAL_TABLET | Freq: Two times a day (BID) | ORAL | 0 refills | Status: DC

## 2020-02-07 NOTE — ED Triage Notes (Signed)
Pt c/o rt lower back pain x1wk. States has been taking tizanidine 4mg  with no relief just makes her sleepy. States has been using heat/ice and tylenol/motrin with no relief.

## 2020-02-07 NOTE — ED Provider Notes (Signed)
[name]    CSN: [id number] Arrival date & time: 02/07/20  1301      History   Chief Complaint Chief Complaint  Patient presents with  . Back Pain    HPI Shannon Mckenzie is a 59 y.o. female.   59 year old female comes in for 1 week history of right lower back pain.  Denies injury/trauma.  No pain at rest, pain with range of motion/ambulation with "catching" sensation.  Denies radiation of pain.  Denies saddle anesthesia, loss of bladder or bowel control.  Has been taking tizanidine without much relief.  Ice, heat, Tylenol/Motrin without relief.     Past Medical History:  Diagnosis Date  . Allergy   . Anemia   . Anxiety   . Aspiration pneumonia (HCC) 09/10/2019  . Asthma   . Depression   . DJD (degenerative joint disease) of hip   . Former smoker   . GERD (gastroesophageal reflux disease)   . Hand pain, right   . Heart murmur   . Hiatal hernia   . History of kidney stones    history  . Hyperlipidemia   . Hypertension   . Kidney stone   . Osteoarthritis   . Pain, hip    right  . Pneumonia    hx but no hospitalization  . Reactive airway disease   . RLS (restless legs syndrome)   . Routine gynecological examination    sees gynecology, [name] NP  . SOB (shortness of breath) 9/14   cardiac eval with stress test and echo; Dr. [name]  . Statin intolerance   . Vision problems    wears contact lenses    Patient Active Problem List   Diagnosis Date Noted  . Menopausal symptom 11/30/2019  . Urinary incontinence 11/30/2019  . Status post Nissen fundoplication 10/01/2019  . Asthma with status asthmaticus   . Depression 09/10/2019  . Murmur 07/16/2018  . Hiatal hernia with GERD without esophagitis 01/10/2018  . Allergic rhinitis 05/26/2017  . Carpal tunnel syndrome 05/26/2017  . Former smoker 05/26/2017  . Major depression in full remission (HCC) 05/08/2017  . GAD (generalized anxiety disorder) 05/08/2017  . Iron deficiency 02/20/2016    . Osteoarthritis 03/13/2015  . Cough 03/13/2015  . Back pain 11/10/2014  . Rash and nonspecific skin eruption 07/21/2014  . Cold feeling 05/03/2014  . Sinusitis, chronic 06/14/2013  . Anemia 04/16/2013  . Extrinsic asthma, mild persistent, uncomplicated 03/20/2013  . Upper airway cough syndrome ? all related to sinus dz 03/19/2013  . Essential hypertension, benign 04/15/2012  . Obesity 04/15/2012  . Hyperlipidemia 04/15/2012    Past Surgical History:  Procedure Laterality Date  . bilateral hip replacement     nov 21st 2019 right, Jun 09 2018 left   . CARPAL TUNNEL RELEASE Left 07/03/2017   Procedure: LEFT CARPAL TUNNEL RELEASE;  Surgeon: Betha Loa, MD;  Location: [facility];  Service: Orthopedics;  Laterality: Left;  . COLONOSCOPY  06/2012   Dr. Ramon Dredge; repeat 10 years  . HIATAL HERNIA REPAIR N/A 10/01/2019   Procedure: LAPAROSCOPIC REPAIR OF HIATAL HERNIA WITH NISSEN FUNDOPLICATION, UPPER ENDO, REMOVAL OF GASTRIC MASS;  Surgeon: [name], MD;  Location: [facility];  Service: General;  Laterality: N/A;  . UPPER GI ENDOSCOPY N/A 07/24/2012   Hiatus Hernia,normal stomach, normal examined duodenum  . WISDOM TOOTH EXTRACTION      OB History    Gravida  1   Para  1   Term  Preterm      AB      Living        SAB      TAB      Ectopic      Multiple      Live Births               Home Medications    Prior to Admission medications   Medication Sig Start Date End Date Taking? Authorizing Provider  fluticasone furoate-vilanterol (BREO ELLIPTA) 100-25 MCG/INH AEPB Inhale 1 puff into the lungs daily. 11/30/19   [name], MD  acidophilus (RISAQUAD) CAPS capsule Take 1 capsule by mouth in the morning and at bedtime.    [provider]  albuterol (PROVENTIL HFA;VENTOLIN HFA) 108 (90 Base) MCG/ACT inhaler Inhale 2 puffs into the lungs every 6 (six) hours as needed for wheezing or shortness of breath. 04/29/18   [name], PA   albuterol (PROVENTIL) (2.5 MG/3ML) 0.083% nebulizer solution Take 3 mLs (2.5 mg total) by nebulization every 6 (six) hours as needed for wheezing or shortness of breath. 11/12/18   [name], MD  ascorbic acid (VITAMIN C) 500 MG tablet Take 500 mg by mouth daily.    [provider]  benzonatate (TESSALON) 100 MG capsule Take 1 capsule (100 mg total) by mouth every 8 (eight) hours. 12/12/19   Hall-Potvin, [place], PA-C  cholecalciferol (VITAMIN D3) 25 MCG (1000 UT) tablet Take 1,000 Units by mouth daily.    [provider]  escitalopram (LEXAPRO) 20 MG tablet TAKE 1 TABLET BY MOUTH EVERY DAY 02/02/20   [name], MD  escitalopram (LEXAPRO) 20 MG tablet TAKE 1 TABLET BY MOUTH EVERY DAY 02/02/20   [name], MD  esomeprazole (NEXIUM) 40 MG capsule Take 1 capsule (40 mg total) by mouth 2 (two) times daily. 03/05/19   [name], MD  ezetimibe (ZETIA) 10 MG tablet TAKE 1 TABLET BY MOUTH EVERY DAY 01/03/20   [name], MD  ferrous sulfate 325 (65 FE) MG tablet Take 325 mg by mouth daily with breakfast.    [provider]  fluticasone (FLONASE) 50 MCG/ACT nasal spray Place 1 spray into both nostrils daily. 10/28/19   [name], MD  losartan (COZAAR) 50 MG tablet TAKE 1 TABLET(50 MG) BY MOUTH DAILY Patient taking differently: Take 50 mg by mouth daily.  04/15/19   [name], MD  melatonin 5 MG TABS Take 5 mg by mouth at bedtime as needed (sleep).    [provider]  methocarbamol (ROBAXIN) 500 MG tablet Take 1 tablet (500 mg total) by mouth 2 (two) times daily. 02/07/20   Cathie Hoops, Larkin Alfred V, PA-C  montelukast (SINGULAIR) 10 MG tablet TAKE 1 TABLET(10 MG) BY MOUTH AT BEDTIME 10/05/19   [name], MD  Multiple Vitamin (MULTIVITAMIN WITH MINERALS) TABS tablet Take 1 tablet by mouth daily.    [provider]  neomycin-polymyxin-hydrocortisone (CORTISPORIN) OTIC solution Place 3 drops into the left ear 3 (three) times daily. 10/14/19    Hall-Potvin, [place], PA-C  ondansetron (ZOFRAN) 4 MG tablet Take 1 tablet (4 mg total) by mouth every 8 (eight) hours as needed for nausea. 10/03/19   [name], MD  predniSONE (STERAPRED UNI-PAK 21 TAB) 10 MG (21) TBPK tablet Take by mouth daily. Take 6 tabs by mouth day 1, then 5 tabs, then 4 tabs, then 3 tabs, 2 tabs, then 1 tab for the last day 02/07/20   Linward Headland  V, PA-C  scopolamine (TRANSDERM-SCOP, 1.5 MG,) 1 MG/3DAYS Place 1 patch (1.5 mg total) onto the skin every 3 (three) days. 10/03/19 10/02/20  [name], MD    Family History Family History  Problem Relation Age of Onset  . Arthritis Mother   . Asthma Mother   . Diabetes Mother   . Cirrhosis Mother        died of liver nonalcoholic cirrhosis  . COPD Mother   . Heart disease Mother        CHF  . Hypertension Mother   . Kidney disease Mother   . Liver disease Mother   . Eating disorder Mother   . Obesity Mother   . Asthma Sister        doesnt know history well  . Drug abuse Sister   . Cancer Father        died of pancreatic cancer at age 53  . Alcohol abuse Father   . Depression Brother   . Alcohol abuse Brother   . Stroke Maternal Aunt        died age 57yo of CVA  . Other Brother        died of MVA  . Other Sister   . Drug abuse Sister        doesnt know history well  . Heart attack Maternal Grandfather 33    Social History Social History   Tobacco Use  . Smoking status: Former Smoker    Packs/day: 1.00    Years: 15.00    Pack years: 15.00    Types: Cigarettes    Quit date: 06/25/1991    Years since quitting: 28.6  . Smokeless tobacco: Never Used  Vaping Use  . Vaping Use: Never used  Substance Use Topics  . Alcohol use: Yes    Alcohol/week: 0.0 standard drinks    Comment: wine every 6 months or so  . Drug use: No     Allergies   Statins, Amoxicillin, and Penicillins   Review of Systems Review of Systems  Reason unable to perform ROS: See HPI as above.     Physical Exam Triage  Vital Signs ED Triage Vitals  Enc Vitals Group     BP 02/07/20 1343 130/80     Pulse Rate 02/07/20 1343 79     Resp 02/07/20 1343 18     Temp 02/07/20 1343 98.5 F (36.9 C)     Temp Source 02/07/20 1343 Oral     SpO2 02/07/20 1343 96 %     Weight --      Height --      Head Circumference --      Peak Flow --      Pain Score 02/07/20 1354 5     Pain Loc --      Pain Edu? --      Excl. in GC? --    No data found.  Updated Vital Signs BP 130/80 (BP Location: Left Arm)   Pulse 79   Temp 98.5 F (36.9 C) (Oral)   Resp 18   LMP 03/13/2015   SpO2 96%   Physical Exam Constitutional:      General: She is not in acute distress.    Appearance: She is well-developed. She is not diaphoretic.  HENT:     Head: Normocephalic and atraumatic.  Eyes:     Conjunctiva/sclera: Conjunctivae normal.     Pupils: Pupils are equal, round, and reactive to light.  Cardiovascular:     Rate  and Rhythm: Normal rate and regular rhythm.     Heart sounds: Normal heart sounds. No murmur heard.  No friction rub. No gallop.   Pulmonary:     Effort: Pulmonary effort is normal. No accessory muscle usage or respiratory distress.     Breath sounds: Normal breath sounds. No stridor. No decreased breath sounds, wheezing, rhonchi or rales.  Musculoskeletal:     Comments: No tenderness on palpation of the spinous processes. Right lumber tenderness to palpation. Full range of motion of back/hip. Sensation intact. Negative straight leg raise.   Skin:    General: Skin is warm and dry.  Neurological:     Mental Status: She is alert and oriented to person, place, and time.    UC Treatments / Results  Labs (all labs ordered are listed, but only abnormal results are displayed) Labs Reviewed - No data to display  EKG   Radiology No results found.  Procedures Procedures (including critical care time)  Medications Ordered in UC Medications  ketorolac (TORADOL) 15 MG/ML injection 15 mg (has no  administration in time range)    Initial Impression / Assessment and Plan / UC Course  I have reviewed the triage vital signs and the nursing notes.  Pertinent labs & imaging results that were available during my care of the patient were reviewed by me and considered in my medical decision making (see chart for details).    Toradol injection in office today. Prednisone and robaxin as directed. Return precautions given.  Final Clinical Impressions(s) / UC Diagnoses   Final diagnoses:  Acute right-sided low back pain without sciatica    ED Prescriptions    Medication Sig Dispense Auth. Provider   predniSONE (STERAPRED UNI-PAK 21 TAB) 10 MG (21) TBPK tablet Take by mouth daily. Take 6 tabs by mouth day 1, then 5 tabs, then 4 tabs, then 3 tabs, 2 tabs, then 1 tab for the last day 21 tablet Mayreli Alden V, PA-C   methocarbamol (ROBAXIN) 500 MG tablet Take 1 tablet (500 mg total) by mouth 2 (two) times daily. 20 tablet Belinda FisherYu, Kenson Groh V, PA-C     PDMP not reviewed this encounter.   Belinda FisherYu, Emmauel Hallums V, PA-C 02/07/20 1424

## 2020-02-07 NOTE — Discharge Instructions (Signed)
Prednisone as directed. Robaxin as needed, this can make you drowsy, so do not take if you are going to drive, operate heavy machinery, or make important decisions. Ice/heat compresses as needed. Follow up with PCP/orthopedics if symptoms worsen, changes for reevaluation. If experience numbness/tingling of the inner thighs, loss of bladder or bowel control, go to the emergency department for evaluation.

## 2020-02-11 ENCOUNTER — Ambulatory Visit
Admission: EM | Admit: 2020-02-11 | Discharge: 2020-02-11 | Disposition: A | Discharge status: Home / Self Care | Payer: BC Managed Care – PPO | Attending: Physician Assistant | Admitting: Physician Assistant

## 2020-02-11 ENCOUNTER — Other Ambulatory Visit: Payer: Self-pay

## 2020-02-11 DIAGNOSIS — Z1152 Encounter for screening for COVID-19: Secondary | ICD-10-CM

## 2020-02-11 NOTE — Discharge Instructions (Signed)

## 2020-02-11 NOTE — ED Triage Notes (Signed)
Pt states had a positive covid exposure on Tuesday. Denies sx's and covid vaccinated.

## 2020-02-13 LAB — NOVEL CORONAVIRUS, NAA: SARS-CoV-2, NAA: NOT DETECTED

## 2020-02-13 LAB — SARS-COV-2, NAA 2 DAY TAT

## 2020-03-03 DIAGNOSIS — M545 Low back pain: Secondary | ICD-10-CM | POA: Diagnosis not present

## 2020-04-02 ENCOUNTER — Other Ambulatory Visit: Payer: Self-pay | Admitting: Family Medicine

## 2020-04-24 DIAGNOSIS — Z86010 Personal history of colonic polyps: Secondary | ICD-10-CM | POA: Diagnosis not present

## 2020-04-24 DIAGNOSIS — Z9889 Other specified postprocedural states: Secondary | ICD-10-CM | POA: Diagnosis not present

## 2020-05-02 ENCOUNTER — Other Ambulatory Visit: Payer: Self-pay | Admitting: Family Medicine

## 2020-05-02 DIAGNOSIS — F325 Major depressive disorder, single episode, in full remission: Secondary | ICD-10-CM

## 2020-05-02 DIAGNOSIS — F411 Generalized anxiety disorder: Secondary | ICD-10-CM

## 2020-05-17 DIAGNOSIS — Z780 Asymptomatic menopausal state: Secondary | ICD-10-CM | POA: Diagnosis not present

## 2020-05-17 DIAGNOSIS — Z130 Encounter for screening for diseases of the blood and blood-forming organs and certain disorders involving the immune mechanism: Secondary | ICD-10-CM | POA: Diagnosis not present

## 2020-05-17 DIAGNOSIS — N9412 Deep dyspareunia: Secondary | ICD-10-CM | POA: Diagnosis not present

## 2020-05-17 DIAGNOSIS — Z1151 Encounter for screening for human papillomavirus (HPV): Secondary | ICD-10-CM | POA: Diagnosis not present

## 2020-05-17 DIAGNOSIS — Z6835 Body mass index (BMI) 35.0-35.9, adult: Secondary | ICD-10-CM | POA: Diagnosis not present

## 2020-05-17 DIAGNOSIS — Z1231 Encounter for screening mammogram for malignant neoplasm of breast: Secondary | ICD-10-CM | POA: Diagnosis not present

## 2020-05-17 DIAGNOSIS — Z124 Encounter for screening for malignant neoplasm of cervix: Secondary | ICD-10-CM | POA: Diagnosis not present

## 2020-05-17 DIAGNOSIS — Z01419 Encounter for gynecological examination (general) (routine) without abnormal findings: Secondary | ICD-10-CM | POA: Diagnosis not present

## 2020-05-17 LAB — RESULTS CONSOLE HPV: CHL HPV: NEGATIVE

## 2020-05-17 LAB — HM PAP SMEAR: HM Pap smear: NEGATIVE

## 2020-05-17 LAB — HM MAMMOGRAPHY

## 2020-05-31 ENCOUNTER — Other Ambulatory Visit: Payer: Self-pay | Admitting: Family Medicine

## 2020-05-31 DIAGNOSIS — F411 Generalized anxiety disorder: Secondary | ICD-10-CM

## 2020-05-31 DIAGNOSIS — F325 Major depressive disorder, single episode, in full remission: Secondary | ICD-10-CM

## 2020-06-19 ENCOUNTER — Other Ambulatory Visit: Payer: Self-pay

## 2020-06-19 ENCOUNTER — Ambulatory Visit
Admission: EM | Admit: 2020-06-19 | Discharge: 2020-06-19 | Disposition: A | Discharge status: Home / Self Care | Payer: BC Managed Care – PPO | Attending: Emergency Medicine | Admitting: Emergency Medicine

## 2020-06-19 DIAGNOSIS — N760 Acute vaginitis: Secondary | ICD-10-CM | POA: Insufficient documentation

## 2020-06-19 DIAGNOSIS — N3000 Acute cystitis without hematuria: Secondary | ICD-10-CM | POA: Diagnosis not present

## 2020-06-19 LAB — POCT URINALYSIS DIP (MANUAL ENTRY)
Blood, UA: NEGATIVE
Glucose, UA: NEGATIVE mg/dL
Nitrite, UA: NEGATIVE
Spec Grav, UA: >=1.03 — AB (ref 1.010–1.025)
Urobilinogen, UA: 1 E.U./dL
pH, UA: 6 (ref 5.0–8.0)

## 2020-06-19 MED ORDER — NITROFURANTOIN MONOHYD MACRO 100 MG PO CAPS: 100.0000 mg | ORAL_CAPSULE | Freq: Two times a day (BID) | ORAL | 0 refills | Status: DC

## 2020-06-19 MED ORDER — FLUCONAZOLE 200 MG PO TABS: 200.0000 mg | ORAL_TABLET | Freq: Once | ORAL | 0 refills | Status: AC

## 2020-06-19 NOTE — ED Provider Notes (Signed)
[name]    CSN: [id number] Arrival date & time: 06/19/20  1608      History   Chief Complaint Chief Complaint  Patient presents with  . Vaginal Itching    X 3 days    HPI Shannon Mckenzie is a 59 y.o. female  Presenting for possible UTI as well as vaginal pruritus.  Endorsing symptoms for the last 2 days.  Has been having left flank pain.  Endorsing urinary malodor.  Denies discharge, pelvic pain.  Past Medical History:  Diagnosis Date  . Allergy   . Anemia   . Anxiety   . Aspiration pneumonia (HCC) 09/10/2019  . Asthma   . Depression   . DJD (degenerative joint disease) of hip   . Former smoker   . GERD (gastroesophageal reflux disease)   . Hand pain, right   . Heart murmur   . Hiatal hernia   . History of kidney stones    history  . Hyperlipidemia   . Hypertension   . Kidney stone   . Osteoarthritis   . Pain, hip    right  . Pneumonia    hx but no hospitalization  . Reactive airway disease   . RLS (restless legs syndrome)   . Routine gynecological examination    sees gynecology, [name] NP  . SOB (shortness of breath) 9/14   cardiac eval with stress test and echo; Dr. [name]  . Statin intolerance   . Vision problems    wears contact lenses    Patient Active Problem List   Diagnosis Date Noted  . Menopausal symptom 11/30/2019  . Urinary incontinence 11/30/2019  . Status post Nissen fundoplication 10/01/2019  . Asthma with status asthmaticus   . Depression 09/10/2019  . Murmur 07/16/2018  . Hiatal hernia with GERD without esophagitis 01/10/2018  . Allergic rhinitis 05/26/2017  . Carpal tunnel syndrome 05/26/2017  . Former smoker 05/26/2017  . Major depression in full remission (HCC) 05/08/2017  . GAD (generalized anxiety disorder) 05/08/2017  . Iron deficiency 02/20/2016  . Osteoarthritis 03/13/2015  . Cough 03/13/2015  . Back pain 11/10/2014  . Rash and nonspecific skin eruption 07/21/2014  . Cold feeling 05/03/2014   . Sinusitis, chronic 06/14/2013  . Anemia 04/16/2013  . Extrinsic asthma, mild persistent, uncomplicated 03/20/2013  . Upper airway cough syndrome ? all related to sinus dz 03/19/2013  . Essential hypertension, benign 04/15/2012  . Obesity 04/15/2012  . Hyperlipidemia 04/15/2012    Past Surgical History:  Procedure Laterality Date  . bilateral hip replacement     nov 21st 2019 right, Jun 09 2018 left   . CARPAL TUNNEL RELEASE Left 07/03/2017   Procedure: LEFT CARPAL TUNNEL RELEASE;  Surgeon: Betha Loa, MD;  Location: [facility];  Service: Orthopedics;  Laterality: Left;  . COLONOSCOPY  06/2012   Dr. Ramon Dredge; repeat 10 years  . HIATAL HERNIA REPAIR N/A 10/01/2019   Procedure: LAPAROSCOPIC REPAIR OF HIATAL HERNIA WITH NISSEN FUNDOPLICATION, UPPER ENDO, REMOVAL OF GASTRIC MASS;  Surgeon: [name], MD;  Location: [facility];  Service: General;  Laterality: N/A;  . UPPER GI ENDOSCOPY N/A 07/24/2012   Hiatus Hernia,normal stomach, normal examined duodenum  . WISDOM TOOTH EXTRACTION      OB History    Gravida  1   Para  1   Term      Preterm      AB      Living        SAB  IAB      Ectopic      Multiple      Live Births               Home Medications    Prior to Admission medications   Medication Sig Start Date End Date Taking? Authorizing Provider  fluconazole (DIFLUCAN) 200 MG tablet Take 1 tablet (200 mg total) by mouth once for 1 dose. May repeat in 72 hours if needed 06/19/20 06/19/20 Yes Hall-Potvin, [place], PA-C  nitrofurantoin, macrocrystal-monohydrate, (MACROBID) 100 MG capsule Take 1 capsule (100 mg total) by mouth 2 (two) times daily. 06/19/20  Yes Hall-Potvin, [place], PA-C  acidophilus (RISAQUAD) CAPS capsule Take 1 capsule by mouth in the morning and at bedtime.    [provider]  albuterol (PROVENTIL HFA;VENTOLIN HFA) 108 (90 Base) MCG/ACT inhaler Inhale 2 puffs into the lungs every 6 (six) hours as needed for  wheezing or shortness of breath. 04/29/18   [name], PA  albuterol (PROVENTIL) (2.5 MG/3ML) 0.083% nebulizer solution Take 3 mLs (2.5 mg total) by nebulization every 6 (six) hours as needed for wheezing or shortness of breath. 11/12/18   [name], MD  ascorbic acid (VITAMIN C) 500 MG tablet Take 500 mg by mouth daily.    [provider]  benzonatate (TESSALON) 100 MG capsule Take 1 capsule (100 mg total) by mouth every 8 (eight) hours. 12/12/19   Hall-Potvin, [place], PA-C  BREO ELLIPTA 100-25 MCG/INH AEPB INHALE 1 PUFF INTO THE LUNGS DAILY 05/31/20   [name], MD  cholecalciferol (VITAMIN D3) 25 MCG (1000 UT) tablet Take 1,000 Units by mouth daily.    [provider]  escitalopram (LEXAPRO) 20 MG tablet TAKE 1 TABLET BY MOUTH EVERY DAY 02/02/20   [name], MD  escitalopram (LEXAPRO) 20 MG tablet TAKE 1 TABLET BY MOUTH EVERY DAY 05/03/20   [name], MD  esomeprazole (NEXIUM) 40 MG capsule Take 1 capsule (40 mg total) by mouth 2 (two) times daily. 03/05/19   [name], MD  ezetimibe (ZETIA) 10 MG tablet TAKE 1 TABLET BY MOUTH EVERY DAY 06/02/20   [name], MD  ferrous sulfate 325 (65 FE) MG tablet Take 325 mg by mouth daily with breakfast.    [provider]  fluticasone (FLONASE) 50 MCG/ACT nasal spray Place 1 spray into both nostrils daily. 10/28/19   [name], MD  losartan (COZAAR) 50 MG tablet TAKE 1 TABLET(50 MG) BY MOUTH DAILY 04/03/20   [name], MD  melatonin 5 MG TABS Take 5 mg by mouth at bedtime as needed (sleep).    [provider]  methocarbamol (ROBAXIN) 500 MG tablet Take 1 tablet (500 mg total) by mouth 2 (two) times daily. 02/07/20   Cathie Hoops, Amy V, PA-C  montelukast (SINGULAIR) 10 MG tablet TAKE 1 TABLET(10 MG) BY MOUTH AT BEDTIME 10/05/19   [name], MD  Multiple Vitamin (MULTIVITAMIN WITH MINERALS) TABS tablet Take 1 tablet by mouth daily.    [provider]   neomycin-polymyxin-hydrocortisone (CORTISPORIN) OTIC solution Place 3 drops into the left ear 3 (three) times daily. 10/14/19   Hall-Potvin, [place], PA-C  ondansetron (ZOFRAN) 4 MG tablet Take 1 tablet (4 mg total) by mouth every 8 (eight) hours as needed for nausea. 10/03/19   [name], MD  predniSONE (STERAPRED UNI-PAK 21 TAB) 10 MG (21) TBPK tablet Take by mouth daily. Take 6 tabs by mouth day 1, then 5 tabs, then 4  tabs, then 3 tabs, 2 tabs, then 1 tab for the last day 02/07/20   Belinda Fisher, PA-C  scopolamine (TRANSDERM-SCOP, 1.5 MG,) 1 MG/3DAYS Place 1 patch (1.5 mg total) onto the skin every 3 (three) days. 10/03/19 10/02/20  [name], MD    Family History Family History  Problem Relation Age of Onset  . Arthritis Mother   . Asthma Mother   . Diabetes Mother   . Cirrhosis Mother        died of liver nonalcoholic cirrhosis  . COPD Mother   . Heart disease Mother        CHF  . Hypertension Mother   . Kidney disease Mother   . Liver disease Mother   . Eating disorder Mother   . Obesity Mother   . Asthma Sister        doesnt know history well  . Drug abuse Sister   . Cancer Father        died of pancreatic cancer at age 25  . Alcohol abuse Father   . Depression Brother   . Alcohol abuse Brother   . Stroke Maternal Aunt        died age 57yo of CVA  . Other Brother        died of MVA  . Other Sister   . Drug abuse Sister        doesnt know history well  . Heart attack Maternal Grandfather 62    Social History Social History   Tobacco Use  . Smoking status: Former Smoker    Packs/day: 1.00    Years: 15.00    Pack years: 15.00    Types: Cigarettes    Quit date: 06/25/1991    Years since quitting: 29.0  . Smokeless tobacco: Never Used  Vaping Use  . Vaping Use: Never used  Substance Use Topics  . Alcohol use: Yes    Alcohol/week: 0.0 standard drinks    Comment: wine every 6 months or so  . Drug use: No     Allergies   Statins, Amoxicillin, and  Penicillins   Review of Systems Review of Systems  Constitutional: Negative for fatigue and fever.  Respiratory: Negative for cough and shortness of breath.   Cardiovascular: Negative for chest pain and palpitations.  Gastrointestinal: Negative for constipation and diarrhea.  Genitourinary: Positive for dysuria. Negative for flank pain, frequency, hematuria, pelvic pain, urgency, vaginal bleeding, vaginal discharge and vaginal pain.     Physical Exam Triage Vital Signs ED Triage Vitals  Enc Vitals Group     BP 06/19/20 1647 119/80     Pulse Rate 06/19/20 1647 79     Resp 06/19/20 1646 18     Temp 06/19/20 1647 98 F (36.7 C)     Temp Source 06/19/20 1646 Oral     SpO2 06/19/20 1647 96 %     Weight --      Height --      Head Circumference --      Peak Flow --      Pain Score 06/19/20 1648 2     Pain Loc --      Pain Edu? --      Excl. in GC? --    No data found.  Updated Vital Signs BP 119/80 (BP Location: Right Arm)   Pulse 79   Temp 98 F (36.7 C) (Oral)   Resp 18   LMP 03/13/2015   SpO2 96%   Visual Acuity Right Eye  Distance:   Left Eye Distance:   Bilateral Distance:    Right Eye Near:   Left Eye Near:    Bilateral Near:     Physical Exam Constitutional:      General: She is not in acute distress. HENT:     Head: Normocephalic and atraumatic.  Eyes:     General: No scleral icterus.    Pupils: Pupils are equal, round, and reactive to light.  Cardiovascular:     Rate and Rhythm: Normal rate.  Pulmonary:     Effort: Pulmonary effort is normal.  Abdominal:     General: Bowel sounds are normal.     Palpations: Abdomen is soft.     Tenderness: There is no abdominal tenderness. There is no right CVA tenderness, left CVA tenderness or guarding.  Skin:    Coloration: Skin is not jaundiced or pale.  Neurological:     Mental Status: She is alert and oriented to person, place, and time.      UC Treatments / Results  Labs (all labs ordered are  listed, but only abnormal results are displayed) Labs Reviewed  POCT URINALYSIS DIP (MANUAL ENTRY) - Abnormal; Notable for the following components:      Result Value   Bilirubin, UA small (*)    Ketones, POC UA small (15) (*)    Spec Grav, UA >=1.030 (*)    Protein Ur, POC trace (*)    Leukocytes, UA Trace (*)    All other components within normal limits  URINE CULTURE    EKG   Radiology No results found.  Procedures Procedures (including critical care time)  Medications Ordered in UC Medications - No data to display  Initial Impression / Assessment and Plan / UC Course  I have reviewed the triage vital signs and the nursing notes.  Pertinent labs & imaging results that were available during my care of the patient were reviewed by me and considered in my medical decision making (see chart for details).     Urine as above, culture pending.  Will treat empirically for UTI, acute vaginitis as below.  Return precautions discussed, pt verbalized understanding and is agreeable to plan. Final Clinical Impressions(s) / UC Diagnoses   Final diagnoses:  Acute vaginitis  Acute cystitis without hematuria     Discharge Instructions     Take antibiotic twice daily with food. Important to drink plenty of water throughout the day. May take Azo as needed for burning sensation. Return for worsening urinary symptoms, blood in urine, abdominal or back pain, fever.    ED Prescriptions    Medication Sig Dispense Auth. Provider   fluconazole (DIFLUCAN) 200 MG tablet Take 1 tablet (200 mg total) by mouth once for 1 dose. May repeat in 72 hours if needed 2 tablet Hall-Potvin, [place], PA-C   nitrofurantoin, macrocrystal-monohydrate, (MACROBID) 100 MG capsule Take 1 capsule (100 mg total) by mouth 2 (two) times daily. 10 capsule Hall-Potvin, [place], PA-C     PDMP not reviewed this encounter.   Odette FractionHall-Potvin, [place], [place]-C 06/19/20 1746

## 2020-06-19 NOTE — Discharge Instructions (Addendum)
Take antibiotic twice daily with food. °Important to drink plenty of water throughout the day. °May take Azo as needed for burning sensation. °Return for worsening urinary symptoms, blood in urine, abdominal or back pain, fever. °

## 2020-06-19 NOTE — ED Triage Notes (Signed)
Patient states she has had vaginal itching x 2 days. Pt had a cough and was urinating slightly when she coughed which may have caused it. Pt also complains of flank.

## 2020-06-21 LAB — URINE CULTURE: Culture: <10000 — AB

## 2020-06-28 DIAGNOSIS — Z113 Encounter for screening for infections with a predominantly sexual mode of transmission: Secondary | ICD-10-CM | POA: Diagnosis not present

## 2020-06-28 DIAGNOSIS — R300 Dysuria: Secondary | ICD-10-CM | POA: Diagnosis not present

## 2020-06-28 DIAGNOSIS — N898 Other specified noninflammatory disorders of vagina: Secondary | ICD-10-CM | POA: Diagnosis not present

## 2020-07-25 ENCOUNTER — Other Ambulatory Visit: Payer: Self-pay

## 2020-07-25 ENCOUNTER — Encounter: Payer: Self-pay | Admitting: Family Medicine

## 2020-07-25 ENCOUNTER — Telehealth (INDEPENDENT_AMBULATORY_CARE_PROVIDER_SITE_OTHER): Payer: BC Managed Care – PPO | Admitting: Family Medicine

## 2020-07-25 VITALS — BP 142/95 | HR 81

## 2020-07-25 DIAGNOSIS — R519 Headache, unspecified: Secondary | ICD-10-CM

## 2020-07-25 DIAGNOSIS — U071 COVID-19: Secondary | ICD-10-CM

## 2020-07-25 NOTE — Progress Notes (Signed)
I connected with Shannon Mckenzie on 07/25/20 at  4:00 PM EST by video and verified that I am speaking with the correct person using two identifiers.   I discussed the limitations, risks, security and privacy concerns of performing an evaluation and management service by video and the availability of in person appointments. I also discussed with the patient that there may be a patient responsible charge related to this service. The patient expressed understanding and agreed to proceed.  Patient location: Home Provider Location: [facility] Participants: [name] and Shannon Mckenzie   Subjective:     Shannon Mckenzie is a 60 y.o. female presenting for Covid Positive (Onset of sx 07/19/20), Headache (Back, left side of head gets shooting pains ), and Fatigue     Headache  This is a new problem. The current episode started in the past 7 days. The pain is located in the occipital region. The pain does not radiate. The quality of the pain is described as shooting. Associated symptoms include insomnia. Pertinent negatives include no drainage, ear pain, eye pain, fever, hearing loss, neck pain, numbness, phonophobia, photophobia, sinus pressure, tingling or weakness. She has tried acetaminophen for the symptoms.   Was having sinus HA at first - this has improved Now having headache that starts in the back and travels up the side of the left head - bottom of ear to the top of the left side side  Has not been able to sleep due to the HA - tried oxycodone last night 5 mg and that helped with pain and sleep last night  Has diclofenac   Fatigue is overall improving  Drinking well, getting up in the morning  Has an old prescription for oxycodone 5 mg - 7 pills  Review of Systems  Constitutional: Negative for fever.  HENT: Negative for ear pain, hearing loss and sinus pressure.   Eyes: Negative for photophobia and pain.  Musculoskeletal: Negative for neck pain.  Neurological:  Positive for headaches. Negative for tingling, weakness and numbness.  Psychiatric/Behavioral: The patient has insomnia.      Social History   Tobacco Use  Smoking Status Former Smoker  . Packs/day: 1.00  . Years: 15.00  . Pack years: 15.00  . Types: Cigarettes  . Quit date: 06/25/1991  . Years since quitting: 29.1  Smokeless Tobacco Never Used        Objective:   BP Readings from Last 3 Encounters:  07/25/20 (!) 142/95  06/19/20 119/80  02/11/20 (!) 171/100   Wt Readings from Last 3 Encounters:  10/01/19 186 lb 4 oz (84.5 kg)  09/22/19 186 lb 4 oz (84.5 kg)  09/22/19 185 lb (83.9 kg)    BP (!) 142/95   Pulse 81   LMP 03/13/2015   Phone Speaking in complete sentences A&O No acute distress      Assessment & Plan:   Problem List Items Addressed This Visit   None   Visit Diagnoses    COVID-19 virus infection    -  Primary   Nonintractable headache, unspecified chronicity pattern, unspecified headache type         Discussed that HA is a common symptom of covid -19. Can last for a few weeks Encouraged NSAIDS and tylenol as primary treatment ER/return precautions OK for occasional oxycodone at night - has 5 mg #7 left Call if not improving   Interactive audio and video telecommunications were attempted between this provider and patient, however failed,  due to patient having technical difficulties OR patient did not have access to video capability.  We continued and completed visit with audio only.   Start Time: 9:07 End Time: 9:20     No follow-ups on file.  [name], MD

## 2020-08-10 DIAGNOSIS — H5203 Hypermetropia, bilateral: Secondary | ICD-10-CM | POA: Diagnosis not present

## 2020-08-10 DIAGNOSIS — H524 Presbyopia: Secondary | ICD-10-CM | POA: Diagnosis not present

## 2020-09-17 ENCOUNTER — Other Ambulatory Visit: Payer: Self-pay | Admitting: Family Medicine

## 2020-09-17 DIAGNOSIS — F411 Generalized anxiety disorder: Secondary | ICD-10-CM

## 2020-09-17 DIAGNOSIS — F325 Major depressive disorder, single episode, in full remission: Secondary | ICD-10-CM

## 2020-09-18 NOTE — Telephone Encounter (Signed)
Called patient to schedule cpe. LVM to call back and schedule.  

## 2020-09-18 NOTE — Telephone Encounter (Signed)
Will need to schedule annual visit or follow-up visit prior to next refill as last seen 1 year ago.

## 2020-09-18 NOTE — Telephone Encounter (Signed)
Last OV: 09/14/19 Last refill: 05/03/20 #90 0 refills No future appts scheduled.

## 2020-09-19 NOTE — Telephone Encounter (Signed)
Viewed schedule and patient scheduled 4/19. 

## 2020-09-22 ENCOUNTER — Other Ambulatory Visit: Payer: Self-pay | Admitting: Family Medicine

## 2020-10-10 ENCOUNTER — Encounter: Payer: BC Managed Care – PPO | Admitting: Family Medicine

## 2020-10-10 ENCOUNTER — Telehealth: Payer: Self-pay

## 2020-10-10 ENCOUNTER — Other Ambulatory Visit: Payer: Self-pay

## 2020-10-10 ENCOUNTER — Ambulatory Visit (INDEPENDENT_AMBULATORY_CARE_PROVIDER_SITE_OTHER): Payer: BC Managed Care – PPO | Admitting: Family Medicine

## 2020-10-10 VITALS — BP 110/80 | HR 75 | Temp 97.6°F | Ht 61.8 in | Wt 198.0 lb

## 2020-10-10 DIAGNOSIS — M1990 Unspecified osteoarthritis, unspecified site: Secondary | ICD-10-CM | POA: Diagnosis not present

## 2020-10-10 DIAGNOSIS — K6289 Other specified diseases of anus and rectum: Secondary | ICD-10-CM | POA: Diagnosis not present

## 2020-10-10 DIAGNOSIS — E785 Hyperlipidemia, unspecified: Secondary | ICD-10-CM

## 2020-10-10 DIAGNOSIS — I10 Essential (primary) hypertension: Secondary | ICD-10-CM

## 2020-10-10 MED ORDER — DICLOFENAC SODIUM 75 MG PO TBEC
75.0000 mg | DELAYED_RELEASE_TABLET | Freq: Two times a day (BID) | ORAL | 0 refills | Status: DC | PRN
Start: 2020-10-10 — End: 2020-11-22

## 2020-10-10 MED ORDER — DICLOFENAC SODIUM 75 MG PO TBEC: 75.0000 mg | DELAYED_RELEASE_TABLET | ORAL | 0 refills | Status: DC | PRN

## 2020-10-10 NOTE — Telephone Encounter (Signed)
Updated script sent.

## 2020-10-10 NOTE — Assessment & Plan Note (Signed)
Small skin breakdown present on the distal rectum. Discussed treatment for constipation and f/u if not improved.

## 2020-10-10 NOTE — Telephone Encounter (Signed)
Pharmacist (did not leave name) at [facility] left v/m requesting clarification of escript for Diclofenac 75 mg. [facility] needs specific directions because one tab as needed is not approved by insurance. Need maximum tabs that can be taken per day with dosing frequency listed.

## 2020-10-10 NOTE — Progress Notes (Signed)
Annual Exam   Chief Complaint:  Chief Complaint  Patient presents with  . Annual Exam    Requesting PAP/mammo from OBGYN  . Anal Fissure    Has seen bright red blood when wiping x 2 months     History of Present Illness:  Ms. Shannon Mckenzie is a 60 y.o. G1P1 who LMP was Patient's last menstrual period was 03/13/2015., presents today for her annual examination.    #bright red blood per rectum - burning like a tear - red blood when wiping - saw her GYN and was having vaginal dryness but not taking this - painful bm - firm stools   Nutrition She does not get adequate calcium and Vitamin D in her diet. Diet: takeout 5 days a week Exercise: was caring for an elderly person and planning to this again  Safety The patient wears seatbelts: yes.     The patient feels safe at home and in their relationships: yes.   Menstrual:  Symptoms of menopause: vaginal dryness   GYN She is single partner, contraception - post menopausal status.    Cervical Cancer Screening (21-65):   Last Pap:  2021Results were: no abnormalities   Breast Cancer Screening (Age 64-74):  There is no FH of breast cancer. There is no FH of ovarian cancer. BRCA screening Not Indicated.  Last Mammogram: 2021 The patient does want a mammogram this year.    Colon Cancer Screening:  Age 18-75 yo - benefits outweigh the risk. Adults 47-85 yo who have never been screened benefit.  Benefits: 134000 people in 2016 will be diagnosed and 49,000 will die - early detection helps [place]: Complications 2/2 to colonoscopy High Risk (Colonoscopy): genetic disorder (Lynch syndrome or familial adenomatous polyposis), personal hx of IBD, previous adenomatous polyp, or previous colorectal cancer, FamHx start 10 years before the age at diagnosis, increased in males and black race  Options:  FIT - looks for hemoglobin (blood in the stool) - specific and fairly sensitive - must be done annually Cologuard - looks for DNA and blood  - more sensitive - therefore can have more false positives, every 3 years Colonoscopy - every 10 years if normal - sedation, bowl prep, must have someone drive you  Shared decision making and the patient had decided to do colonoscopy - 2021.   Social History   Tobacco Use  Smoking Status Former Smoker  . Packs/day: 1.00  . Years: 15.00  . Pack years: 15.00  . Types: Cigarettes  . Quit date: 06/25/1991  . Years since quitting: 29.3  Smokeless Tobacco Never Used    Lung Cancer Screening (Ages 69-79): not applicable   Weight Wt Readings from Last 3 Encounters:  10/10/20 198 lb (89.8 kg)  10/01/19 186 lb 4 oz (84.5 kg)  09/22/19 186 lb 4 oz (84.5 kg)   Patient has high BMI  BMI Readings from Last 1 Encounters:  10/10/20 36.45 kg/m     Chronic disease screening Blood pressure monitoring:  BP Readings from Last 3 Encounters:  10/10/20 110/80  07/25/20 (!) 142/95  06/19/20 119/80    Lipid Monitoring: Indication for screening: age >71, obesity, diabetes, family hx, CV risk factors.  Lipid screening: Yes  Lab Results  Component Value Date   CHOL 236 (A) 04/27/2018   HDL 54 04/27/2018   LDLCALC 167 04/27/2018   LDLDIRECT 171.0 01/09/2018   TRIG 76 04/27/2018   CHOLHDL 6 07/14/2017     Diabetes Screening: age >46, overweight, family hx, PCOS, hx  of gestational diabetes, at risk ethnicity Diabetes Screening screening: Yes  Lab Results  Component Value Date   HGBA1C 5.1 04/27/2018     Past Medical History:  Diagnosis Date  . Allergy   . Anemia   . Anxiety   . Aspiration pneumonia ([place]) 09/10/2019  . Asthma   . Depression   . DJD (degenerative joint disease) of hip   . Former smoker   . GERD (gastroesophageal reflux disease)   . Hand pain, right   . Heart murmur   . Hiatal hernia   . History of kidney stones    history  . Hyperlipidemia   . Hypertension   . Kidney stone   . Osteoarthritis   . Pain, hip    right  . Pneumonia    hx but no  hospitalization  . Reactive airway disease   . RLS (restless legs syndrome)   . Routine gynecological examination    sees gynecology, [name] NP  . SOB (shortness of breath) 9/14   cardiac eval with stress test and echo; Dr. Tollie Eth  . Statin intolerance   . Vision problems    wears contact lenses    Past Surgical History:  Procedure Laterality Date  . bilateral hip replacement     nov 21st 2019 right, Jun 09 2018 left   . CARPAL TUNNEL RELEASE Left 07/03/2017   Procedure: LEFT CARPAL TUNNEL RELEASE;  Surgeon: Leanora Cover, MD;  Location: [facility];  Service: Orthopedics;  Laterality: Left;  . COLONOSCOPY  06/2012   Dr. Percell Miller; repeat 10 years  . HIATAL HERNIA REPAIR N/A 10/01/2019   Procedure: LAPAROSCOPIC REPAIR OF HIATAL HERNIA WITH NISSEN FUNDOPLICATION, UPPER ENDO, REMOVAL OF GASTRIC MASS;  Surgeon: Johnathan Hausen, MD;  Location: [facility];  Service: General;  Laterality: N/A;  . UPPER GI ENDOSCOPY N/A 07/24/2012   [place] Hernia,normal stomach, normal examined duodenum  . WISDOM TOOTH EXTRACTION      Prior to Admission medications   Medication Sig Start Date End Date Taking? Authorizing Provider  acidophilus (RISAQUAD) CAPS capsule Take 1 capsule by mouth in the morning and at bedtime.    [provider]  albuterol (PROVENTIL HFA;VENTOLIN HFA) 108 (90 Base) MCG/ACT inhaler Inhale 2 puffs into the lungs every 6 (six) hours as needed for wheezing or shortness of breath. 04/29/18   Inda Coke, PA  ascorbic acid (VITAMIN C) 500 MG tablet Take 500 mg by mouth daily.    [provider]  BREO ELLIPTA 100-25 MCG/INH AEPB INHALE 1 PUFF INTO THE LUNGS DAILY 05/31/20   Lesleigh Noe, MD  cholecalciferol (VITAMIN D3) 25 MCG (1000 UT) tablet Take 1,000 Units by mouth daily.    [provider]  escitalopram (LEXAPRO) 20 MG tablet TAKE 1 TABLET BY MOUTH EVERY DAY 09/18/20   Lesleigh Noe, MD  esomeprazole (NEXIUM) 40 MG capsule Take 1  capsule (40 mg total) by mouth 2 (two) times daily. 03/05/19   Lesleigh Noe, MD  ezetimibe (ZETIA) 10 MG tablet TAKE 1 TABLET BY MOUTH EVERY DAY 06/02/20   Lesleigh Noe, MD  ferrous sulfate 325 (65 FE) MG tablet Take 325 mg by mouth daily with breakfast.    [provider]  fluticasone (FLONASE) 50 MCG/ACT nasal spray Place 1 spray into both nostrils daily. 10/28/19   Lesleigh Noe, MD  losartan (COZAAR) 50 MG tablet TAKE 1 TABLET(50 MG) BY MOUTH DAILY 04/03/20   Marin Olp, MD  montelukast (SINGULAIR)  10 MG tablet TAKE 1 TABLET(10 MG) BY MOUTH AT BEDTIME 09/25/20   Lesleigh Noe, MD  Multiple Vitamin (MULTIVITAMIN WITH MINERALS) TABS tablet Take 1 tablet by mouth daily.    [provider]    Allergies  Allergen Reactions  . Statins     Myalgias with all statins  . Amoxicillin Hives and Rash  . Penicillins Hives, Other (See Comments) and Rash    Gynecologic History: Patient's last menstrual period was 03/13/2015.  Obstetric History: G1P1  Social History   Socioeconomic History  . Marital status: Married    Spouse name: Marcello Moores  . Number of children: 1  . Years of education: 8th grade  . Highest education level: Not on file  Occupational History  . Occupation: Microbiologist: UNEMPLOYED  Tobacco Use  . Smoking status: Former Smoker    Packs/day: 1.00    Years: 15.00    Pack years: 15.00    Types: Cigarettes    Quit date: 06/25/1991    Years since quitting: 29.3  . Smokeless tobacco: Never Used  Vaping Use  . Vaping Use: Never used  Substance and Sexual Activity  . Alcohol use: Yes    Alcohol/week: 0.0 standard drinks    Comment: wine every 6 months or so  . Drug use: No  . Sexual activity: Yes    Birth control/protection: Post-menopausal  Other Topics Concern  . Not on file  Social History Narrative   11/12/18   From: [place]   Living: with Husband   Work: used to clean houses, but stopped due to pain      Family: one son, also  raised 2 nephews - close with family - also has 2 grandchildren      Enjoys: outdoors activity, spending time [place]       Exercise: still does rehab exercises - working towards goals   Diet: working to try and lose [employer]            Social Determinants of Radio broadcast assistant Strain: Not on file  Food Insecurity: Not on file  Transportation Needs: Not on file  Physical Activity: Not on file  Stress: Not on file  Social Connections: Not on file  Intimate Partner Violence: Not on file    Family History  Problem Relation Age of Onset  . Arthritis Mother   . Asthma Mother   . Diabetes Mother   . Cirrhosis Mother        died of liver nonalcoholic cirrhosis  . COPD Mother   . Heart disease Mother        CHF  . Hypertension Mother   . Kidney disease Mother   . Liver disease Mother   . Eating disorder Mother   . Obesity Mother   . Asthma Sister        doesnt know history well  . Drug abuse Sister   . Cancer Father        died of pancreatic cancer at age 36  . Alcohol abuse Father   . Depression Brother   . Alcohol abuse Brother   . Stroke Maternal Aunt        died age 65yo of CVA  . Other Brother        died of MVA  . Other Sister   . Drug abuse Sister        doesnt know history well  . Heart attack Maternal Grandfather 67  Review of Systems  Constitutional: Negative for chills and fever.  HENT: Negative for congestion and sore throat.   Eyes: Negative for blurred vision and double vision.  Respiratory: Negative for shortness of breath.   Cardiovascular: Negative for chest pain.  Gastrointestinal: Positive for blood in stool and constipation. Negative for heartburn, nausea and vomiting.       Rectal pain   Genitourinary: Negative.   Musculoskeletal: Negative.  Negative for myalgias.  Skin: Negative for rash.  Neurological: Negative for dizziness and headaches.  Endo/Heme/Allergies: Does not bruise/bleed easily.   Psychiatric/Behavioral: Negative for depression. The patient is not nervous/anxious.      Physical Exam BP 110/80   Pulse 75   Temp 97.6 F (36.4 C) (Temporal)   Ht 5' 1.8" (1.57 m)   Wt 198 lb (89.8 kg)   LMP 03/13/2015   SpO2 98%   BMI 36.45 kg/m    BP Readings from Last 3 Encounters:  10/10/20 110/80  07/25/20 (!) 142/95  06/19/20 119/80      Physical Exam Exam conducted with a chaperone present.  Constitutional:      General: She is not in acute distress.    Appearance: She is well-developed. She is not diaphoretic.  HENT:     Head: Normocephalic and atraumatic.     Right Ear: External ear normal.     Left Ear: External ear normal.     Nose: Nose normal.  Eyes:     General: No scleral icterus.    Conjunctiva/sclera: Conjunctivae normal.  Cardiovascular:     Rate and Rhythm: Normal rate and regular rhythm.     Heart sounds: No murmur heard.   Pulmonary:     Effort: Pulmonary effort is normal. No respiratory distress.     Breath sounds: Normal breath sounds. No wheezing.  Abdominal:     General: Bowel sounds are normal. There is no distension.     Palpations: Abdomen is soft. There is no mass.     Tenderness: There is no abdominal tenderness. There is no guarding or rebound.  Genitourinary:    Rectum: Tenderness present.     Comments: Rectal skin tag. Small ulcer on the outer portion of the rectum and ttp to light touch. No obvious fissure.  Musculoskeletal:        General: Normal range of motion.     Cervical back: Neck supple.  Lymphadenopathy:     Cervical: No cervical adenopathy.  Skin:    General: Skin is warm and dry.     Capillary Refill: Capillary refill takes less than 2 seconds.  Neurological:     Mental Status: She is alert and oriented to person, place, and time.     Deep Tendon Reflexes: Reflexes normal.  Psychiatric:        Behavior: Behavior normal.     Results:  PHQ-9:  [place] Office Visit from 09/14/2019 in [facility]  PHQ-9 Total Score 2        Assessment: 60 y.o. G1P1 female here for routine annual physical examination.  Plan: Problem List Items Addressed This Visit      Cardiovascular and Mediastinum   Essential hypertension, benign   Relevant Orders   Comprehensive metabolic panel     Musculoskeletal and Integument   Osteoarthritis - Primary   Relevant Medications   diclofenac (VOLTAREN) 75 MG EC tablet     Other   Hyperlipidemia   Relevant Orders   Lipid panel   Rectal pain  Small skin breakdown present on the distal rectum. Discussed treatment for constipation and f/u if not improved.          Screening: -- Blood pressure screen normal -- cholesterol screening: will obtain -- Weight screening: obese: discussed management options, including lifestyle, dietary, and exercise. -- Diabetes Screening: will obtain -- Nutrition: Encouraged healthy diet  The 10-year ASCVD risk score Mikey Bussing DC Jr., et al., 2013) is: 3.1%*   Values used to calculate the score:     Age: 73 years     Sex: Female     Is Non-Hispanic African American: No     Diabetic: No     Tobacco smoker: No     Systolic Blood Pressure: 378 mmHg     Is BP treated: Yes     HDL Cholesterol: 54 mg/dL*     Total Cholesterol: 236 mg/dL*     * - Cholesterol units were assumed for this score calculation  -- Statin therapy for Age 41-75 with CVD risk >7.5%  Psych -- Depression screening (PHQ-9):  [place] Office Visit from 09/14/2019 in [facility] at [facility]  PHQ-9 Total Score 2       Safety -- tobacco screening: not using -- alcohol screening:  low-risk usage. -- no evidence of domestic violence or intimate partner violence.   Cancer Screening -- pap smear not collected per ASCCP guidelines -- family history of breast cancer screening: done. not at high risk. -- Mammogram - will get records -- Colon cancer (age 1+)-- up to date  Immunizations Immunization  History  Administered Date(s) Administered  . Influenza Split 07/06/2011, 03/20/2012  . Influenza Whole 04/02/2001, 04/23/2002  . Influenza, Seasonal, Injecte, Preservative Fre 04/25/2019  . Influenza,inj,Quad PF,6+ Mos 03/19/2013, 02/23/2014  . Influenza-Unspecified 03/24/2017, 02/22/2018  . Janssen (J&J) SARS-COV-2 Vaccination 09/07/2019  . Moderna Sars-Covid-2 Vaccination 06/16/2020  . Pneumococcal Polysaccharide-23 03/20/2012  . Td 02/16/2005  . Tdap 07/06/2014  . Zoster Recombinat (Shingrix) 07/16/2018, 11/09/2018    -- flu vaccine up to date -- TDAP q10 years up to date -- Shingles (age >73) up to date -- PPSV-23 (19-64 with chronic disease or smoking) up to date  -- Covid-19 Vaccine up to date   Encouraged healthy diet and exercise. Encouraged regular vision and dental care.    Lesleigh Noe, MD

## 2020-10-10 NOTE — Patient Instructions (Signed)
Bone health:  1) 800 units of Vitamin D daily 2) Get 1200 mg of elemental calcium --- this is best from your diet. Try to track how much calcium you get on a typical day. You could find ways to add more (dairy products, leafy greens). Take a supplement for whatever you don't typically get so you reach 1200 mg of calcium.  3) Physical activity (ideally weight bearing) - like walking briskly 30 minutes 5 days a week.    #Rectal skin issue  1) Make sure you get 25-35 g of INSOLUBLE fiber a day -- ideally from diet but you can also take a fiber supplement like Metamucil 2) Drink 1.5 to 2 liters of water daily  3) Avoid straining and prolonged periods on the toilet 4) Limit fatty foods and alcohol 5) Try to lose weight  How to care for your rectal skin now 1) Sitz Baths and warm water sprays 2) Wipe with a wet wipe or flushable wipe  3) Consider using stool softeners (Like Colace or Miralax)   Let me know if no improvement in 6 weeks -- you may need surgery  Constipation   Constipation is a common issue. Often it is related to diet and occasionally medications.    What you can do to treat your symptoms 1) Fiber -- Eat more fiber rich foods: beans, broccoli, berries, avocados, popcorn, pear/apple, green peas, turnip greens, brussels sprouts, whole grains (barley, bran, quinoa, oatmeal) -- Take a Fiber supplement: Psyllium (Metamucil)  -- Could also eat Prunes daily  2) Hydration  -- Drink more water: Try to drink 64 oz of water per day  3) Exercise -- Moderate exercise (walking, jogging, biking) for 30 minutes, 5 days a week  4) Dedicate time for Bowel movements - do not delay  5) Stool Softener  - Docusate Sodium (Colace) 100 mg daily or twice daily as needed     Treating chronic constipation is often about finding the right amount of medication and fiber to keep you regular and comfortable. For some people that may be daily metamucil and colace every other day. For others  it may be Metamucil and colace twice daily and Miralax 3 times a week. The goal is to go slow and listen to your body. And normal can be anywhere from 2-3 soft bowel movements a day to 1 bowel movement every 2-3 days.

## 2020-10-11 ENCOUNTER — Other Ambulatory Visit: Payer: Self-pay | Admitting: Family Medicine

## 2020-10-11 LAB — LIPID PANEL
Cholesterol: 207 mg/dL — ABNORMAL HIGH (ref 0–200)
HDL: 51.8 mg/dL (ref >39.00)
LDL Cholesterol: 129 mg/dL — ABNORMAL HIGH (ref 0–99)
NonHDL: 155.3
Total CHOL/HDL Ratio: 4
Triglycerides: 134 mg/dL (ref 0.0–149.0)
VLDL: 26.8 mg/dL (ref 0.0–40.0)

## 2020-10-11 LAB — COMPREHENSIVE METABOLIC PANEL
ALT: 14 U/L (ref 0–35)
AST: 18 U/L (ref 0–37)
Albumin: 3.9 g/dL (ref 3.5–5.2)
Alkaline Phosphatase: 84 U/L (ref 39–117)
BUN: 18 mg/dL (ref 6–23)
CO2: 30 mEq/L (ref 19–32)
Calcium: 9.6 mg/dL (ref 8.4–10.5)
Chloride: 105 mEq/L (ref 96–112)
Creatinine, Ser: 0.7 mg/dL (ref 0.40–1.20)
GFR: 95.39 mL/min (ref >60.00)
Glucose, Bld: 71 mg/dL (ref 70–99)
Potassium: 4.3 mEq/L (ref 3.5–5.1)
Sodium: 142 mEq/L (ref 135–145)
Total Bilirubin: 0.5 mg/dL (ref 0.2–1.2)
Total Protein: 6.4 g/dL (ref 6.0–8.3)

## 2020-10-30 ENCOUNTER — Other Ambulatory Visit: Payer: Self-pay | Admitting: Family Medicine

## 2020-11-22 ENCOUNTER — Other Ambulatory Visit: Payer: Self-pay | Admitting: Family Medicine

## 2020-11-22 DIAGNOSIS — M1990 Unspecified osteoarthritis, unspecified site: Secondary | ICD-10-CM

## 2020-11-22 NOTE — Telephone Encounter (Signed)
Last refilled on 10/10/20 # 60 with 0 refill. LOV 10/10/20 CPE  No future appointments made.

## 2020-12-28 ENCOUNTER — Other Ambulatory Visit: Payer: Self-pay | Admitting: Family Medicine

## 2021-01-02 ENCOUNTER — Other Ambulatory Visit: Payer: Self-pay | Admitting: Family Medicine

## 2021-01-07 ENCOUNTER — Other Ambulatory Visit: Payer: Self-pay | Admitting: Family Medicine

## 2021-01-07 DIAGNOSIS — M1990 Unspecified osteoarthritis, unspecified site: Secondary | ICD-10-CM

## 2021-01-08 NOTE — Telephone Encounter (Signed)
Last office visit 10/10/2020 for CPE.  Last refilled 11/23/20 for #90 with no refills.  No future appointments.

## 2021-01-16 ENCOUNTER — Telehealth: Payer: Self-pay

## 2021-01-16 NOTE — Telephone Encounter (Signed)
PA submitted through Covermymeds for Breo inhaler 100-25 mcg. Pending decision. Past inhalers used per chart are Symbicort 160-4.5, and Flovent. Patient has been on Breo since 02/20/2017 per chart.  "Your information has been submitted to [facility]. [employer] [place] will review the request and notify you of the determination decision directly, typically within 72 hours of receiving all information.  You will also receive your request decision electronically. To check for an update later, open this request again from your dashboard.  If [employer] [place] has not responded within the specified timeframe or if you have any questions about your PA submission, contact [employer] [place] directly at [phone]."   Key: [id number]

## 2021-01-16 NOTE — Telephone Encounter (Signed)
Received response from insurance. No PA needed this has been taking care of and the medication was covered.

## 2021-01-18 DIAGNOSIS — K5900 Constipation, unspecified: Secondary | ICD-10-CM | POA: Diagnosis not present

## 2021-01-18 DIAGNOSIS — K649 Unspecified hemorrhoids: Secondary | ICD-10-CM | POA: Diagnosis not present

## 2021-01-18 DIAGNOSIS — K625 Hemorrhage of anus and rectum: Secondary | ICD-10-CM | POA: Diagnosis not present

## 2021-01-18 DIAGNOSIS — R1319 Other dysphagia: Secondary | ICD-10-CM | POA: Diagnosis not present

## 2021-01-19 ENCOUNTER — Encounter: Payer: Self-pay | Admitting: Gynecology

## 2021-01-30 ENCOUNTER — Other Ambulatory Visit: Payer: Self-pay | Admitting: Family Medicine

## 2021-02-13 ENCOUNTER — Other Ambulatory Visit: Payer: Self-pay | Admitting: Family Medicine

## 2021-02-22 ENCOUNTER — Other Ambulatory Visit: Payer: Self-pay | Admitting: Family Medicine

## 2021-02-22 DIAGNOSIS — M1990 Unspecified osteoarthritis, unspecified site: Secondary | ICD-10-CM

## 2021-02-23 NOTE — Telephone Encounter (Signed)
My chart message sent to pt.

## 2021-03-03 ENCOUNTER — Other Ambulatory Visit: Payer: Self-pay | Admitting: Family Medicine

## 2021-03-03 DIAGNOSIS — F325 Major depressive disorder, single episode, in full remission: Secondary | ICD-10-CM

## 2021-03-03 DIAGNOSIS — F411 Generalized anxiety disorder: Secondary | ICD-10-CM

## 2021-03-28 LAB — LIPID PANEL
Cholesterol: 244 — AB (ref 0–200)
HDL: 48 (ref 35–70)
LDL Cholesterol: 12
Triglycerides: 73 (ref 40–160)

## 2021-03-28 LAB — BASIC METABOLIC PANEL
BUN: 19 (ref 4–21)
CO2: 23 — AB (ref 13–22)
Chloride: 104 (ref 99–108)
Creatinine: 0.7 (ref 0.5–1.1)
Glucose: 78
Potassium: 4 mEq/L (ref 3.5–5.1)
Sodium: 141 (ref 137–147)

## 2021-03-28 LAB — COMPREHENSIVE METABOLIC PANEL
Albumin: 4 (ref 3.5–5.0)
Calcium: 8.8 (ref 8.7–10.7)
Globulin: 2.1
eGFR: 101

## 2021-03-28 LAB — VITAMIN D 25 HYDROXY (VIT D DEFICIENCY, FRACTURES): Vit D, 25-Hydroxy: 40.9

## 2021-03-28 LAB — HEMOGLOBIN A1C: Hemoglobin A1C: 5.3

## 2021-03-28 LAB — TSH: TSH: 1.69 (ref 0.41–5.90)

## 2021-04-02 ENCOUNTER — Encounter: Payer: Self-pay | Admitting: Family Medicine

## 2021-04-02 ENCOUNTER — Ambulatory Visit: Payer: BC Managed Care – PPO | Admitting: Family Medicine

## 2021-04-02 ENCOUNTER — Other Ambulatory Visit: Payer: Self-pay

## 2021-04-02 VITALS — BP 124/86 | HR 82 | Temp 97.5°F | Ht 61.0 in | Wt 196.0 lb

## 2021-04-02 DIAGNOSIS — J3081 Allergic rhinitis due to animal (cat) (dog) hair and dander: Secondary | ICD-10-CM

## 2021-04-02 NOTE — Patient Instructions (Addendum)
#  allergic reaction - Continue Singulair - Continue allegra once daily - Can also add Zantac or Pepcid - wash any clothes you wore that day and after - cleaning sheets - Can take Benadryl at night for itching - can use hydrocortisone or benadryl cream for topical areas   If rash unimproved and still covering significant portion of body -- call or mychart and consider course of steroids -- but would need to take for 2 weeks

## 2021-04-02 NOTE — Progress Notes (Signed)
Subjective:     Shannon Mckenzie is a 60 y.o. female presenting for Rash (Started 2 days ago. Allergic to cats. Was at a party that day and sat on the floor. Rash started soon after. All over her body. Tried Allegra the last 2 days.)     Rash   #Rash - itching since Saturday afternoon - red spots - everywhere - went to a birthday party on Saturday and was playing with grandkids  - they used to have a cat - had flip flops and jeans but not sure if there was still cat dander - took allegra w/ improvement  - tried hydrocortisone cream - did not take another   No breathing issues or gi symptoms    Review of Systems  Skin:  Positive for rash.    Social History   Tobacco Use  Smoking Status Former   Packs/day: 1.00   Years: 15.00   Pack years: 15.00   Types: Cigarettes   Quit date: 06/25/1991   Years since quitting: 29.7  Smokeless Tobacco Never        Objective:    BP Readings from Last 3 Encounters:  04/02/21 124/86  10/10/20 110/80  07/25/20 (!) 142/95   Wt Readings from Last 3 Encounters:  04/02/21 196 lb (88.9 kg)  10/10/20 198 lb (89.8 kg)  10/01/19 186 lb 4 oz (84.5 kg)    BP 124/86 (BP Location: Left Arm, Patient Position: Sitting, Cuff Size: Normal)   Pulse 82   Temp (!) 97.5 F (36.4 C)   Ht 5\' 1"  (1.549 m)   Wt 196 lb (88.9 kg)   LMP 03/13/2015   SpO2 97%   BMI 37.03 kg/m    Physical Exam Constitutional:      General: She is not in acute distress.    Appearance: She is well-developed. She is not diaphoretic.  HENT:     Right Ear: External ear normal.     Left Ear: External ear normal.  Eyes:     Conjunctiva/sclera: Conjunctivae normal.  Cardiovascular:     Rate and Rhythm: Normal rate and regular rhythm.     Heart sounds: No murmur heard. Pulmonary:     Effort: Pulmonary effort is normal. No respiratory distress.     Breath sounds: Normal breath sounds. No wheezing.  Musculoskeletal:     Cervical back: Neck supple.  Skin:     General: Skin is warm and dry.     Capillary Refill: Capillary refill takes less than 2 seconds.     Comments: Diffuse - back, legs, trunk - erythematous papular rash with scattered wheels  Neurological:     Mental Status: She is alert. Mental status is at baseline.  Psychiatric:        Mood and Affect: Mood normal.        Behavior: Behavior normal.          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 With faint dermatitis and itching.   Cont singulair, allegra Start Zantac and prn benadryl  If no improvement could consider steroids  Return if symptoms worsen or fail to improve.  03/15/2015, MD  This visit occurred during the SARS-CoV-2 public health emergency.  Safety protocols were in place, including screening questions prior to the visit, additional usage  of staff PPE, and extensive cleaning of exam room while observing appropriate contact time as indicated for disinfecting solutions.

## 2021-04-23 ENCOUNTER — Telehealth: Payer: Self-pay | Admitting: Family Medicine

## 2021-04-23 DIAGNOSIS — R1013 Epigastric pain: Secondary | ICD-10-CM | POA: Diagnosis not present

## 2021-04-23 DIAGNOSIS — R1310 Dysphagia, unspecified: Secondary | ICD-10-CM | POA: Diagnosis not present

## 2021-04-23 DIAGNOSIS — K449 Diaphragmatic hernia without obstruction or gangrene: Secondary | ICD-10-CM | POA: Diagnosis not present

## 2021-04-23 DIAGNOSIS — K2930 Chronic superficial gastritis without bleeding: Secondary | ICD-10-CM | POA: Diagnosis not present

## 2021-04-23 DIAGNOSIS — K2289 Other specified disease of esophagus: Secondary | ICD-10-CM | POA: Diagnosis not present

## 2021-04-23 DIAGNOSIS — K209 Esophagitis, unspecified without bleeding: Secondary | ICD-10-CM | POA: Diagnosis not present

## 2021-04-23 DIAGNOSIS — K2210 Ulcer of esophagus without bleeding: Secondary | ICD-10-CM | POA: Diagnosis not present

## 2021-04-23 NOTE — Telephone Encounter (Signed)
Pt called stating that Dr Marca Ancona took her off medication diclofenac (VOLTAREN) 75 MG EC tablet. Pt stating what should she replace it with. Please advise.

## 2021-04-24 NOTE — Telephone Encounter (Signed)
Since this is her GI provider would recommend replacing with Tylenol.   She could ask Dr. Marca Ancona if Tumeric 500 mg up to 3 times daily would be acceptable as it causes less GI upset

## 2021-04-24 NOTE — Telephone Encounter (Signed)
Pt states that Dr. Carole Binning wants pt to follow-up with Rheumatology for the appropriate pain management. GI put in referral for Rheum and in the meantime pt will take Tylenol as needed.

## 2021-05-07 LAB — LIPID PANEL
Cholesterol: 244 — AB (ref 0–200)
HDL: 48 (ref 35–70)
LDL Cholesterol: 184
Triglycerides: 73 (ref 40–160)

## 2021-05-07 LAB — HEMOGLOBIN A1C: Hemoglobin A1C: 5.3

## 2021-05-07 LAB — BASIC METABOLIC PANEL: Glucose: 78

## 2021-05-11 DIAGNOSIS — B3789 Other sites of candidiasis: Secondary | ICD-10-CM | POA: Diagnosis not present

## 2021-05-11 DIAGNOSIS — L309 Dermatitis, unspecified: Secondary | ICD-10-CM | POA: Diagnosis not present

## 2021-05-24 DIAGNOSIS — Z780 Asymptomatic menopausal state: Secondary | ICD-10-CM | POA: Diagnosis not present

## 2021-05-24 DIAGNOSIS — Z1231 Encounter for screening mammogram for malignant neoplasm of breast: Secondary | ICD-10-CM | POA: Diagnosis not present

## 2021-05-24 DIAGNOSIS — N904 Leukoplakia of vulva: Secondary | ICD-10-CM | POA: Diagnosis not present

## 2021-05-24 DIAGNOSIS — Z130 Encounter for screening for diseases of the blood and blood-forming organs and certain disorders involving the immune mechanism: Secondary | ICD-10-CM | POA: Diagnosis not present

## 2021-05-24 DIAGNOSIS — Z6835 Body mass index (BMI) 35.0-35.9, adult: Secondary | ICD-10-CM | POA: Diagnosis not present

## 2021-05-24 DIAGNOSIS — Z01411 Encounter for gynecological examination (general) (routine) with abnormal findings: Secondary | ICD-10-CM | POA: Diagnosis not present

## 2021-05-26 ENCOUNTER — Telehealth: Payer: BC Managed Care – PPO | Admitting: Nurse Practitioner

## 2021-05-26 DIAGNOSIS — R0989 Other specified symptoms and signs involving the circulatory and respiratory systems: Secondary | ICD-10-CM | POA: Diagnosis not present

## 2021-05-26 DIAGNOSIS — R051 Acute cough: Secondary | ICD-10-CM

## 2021-05-26 MED ORDER — PROMETHAZINE-DM 6.25-15 MG/5ML PO SYRP: 5.0000 mL | ORAL_SOLUTION | Freq: Four times a day (QID) | ORAL | 0 refills | Status: DC | PRN

## 2021-05-26 MED ORDER — PREDNISONE 20 MG PO TABS: 40.0000 mg | ORAL_TABLET | Freq: Every day | ORAL | 0 refills | Status: AC

## 2021-05-26 NOTE — Patient Instructions (Signed)

## 2021-05-26 NOTE — Progress Notes (Signed)
Virtual Visit Consent   Shannon Mckenzie, you are scheduled for a virtual visit with Shannon Daphine Deutscher, FNP, a [facility] provider, today.     Just as with appointments in the office, your consent must be obtained to participate.  Your consent will be active for this visit and any virtual visit you may have with one of our providers in the next 365 days.     If you have a MyChart account, a copy of this consent can be sent to you electronically.  All virtual visits are billed to your insurance company just like a traditional visit in the office.    As this is a virtual visit, video technology does not allow for your provider to perform a traditional examination.  This may limit your provider's ability to fully assess your condition.  If your provider identifies any concerns that need to be evaluated in person or the need to arrange testing (such as labs, EKG, etc.), we will make arrangements to do so.     Although advances in technology are sophisticated, we cannot ensure that it will always work on either your end or our end.  If the connection with a video visit is poor, the visit may have to be switched to a telephone visit.  With either a video or telephone visit, we are not always able to ensure that we have a secure connection.     I need to obtain your verbal consent now.   Are you willing to proceed with your visit today? YES   [name] has provided verbal consent on 05/26/2021 for a virtual visit (video or telephone).   Shannon Daphine Deutscher, FNP   Date: 05/26/2021 10:22 AM   Virtual Visit via Video Note   I, Shannon Mckenzie, connected with Shannon Mckenzie ([id number], [birth year]) on 05/26/21 at 10:30 AM EST by a video-enabled telemedicine application and verified that I am speaking with the correct person using two identifiers.  Location: Patient: Virtual Visit Location Patient: Home Provider: Virtual Visit Location Provider: Mobile   I discussed the limitations of  evaluation and management by telemedicine and the availability of in person appointments. The patient expressed understanding and agreed to proceed.    History of Present Illness: Shannon Mckenzie is a 60 y.o. who identifies as a female who was assigned female at birth, and is being seen today for uri.  HPI: Patient states she develop a sore throat yesterday. She has started on mucinex. Has slight congestion. Cough is productive. Was not able to sleep due  to cough.    Review of Systems  Constitutional:  Positive for malaise/fatigue. Negative for chills and fever.  HENT:  Positive for congestion.   Respiratory:  Positive for cough and sputum production.   Musculoskeletal:  Negative for myalgias.  Neurological:  Negative for headaches.   Problems:  Patient Active Problem List   Diagnosis Date Noted   Rectal pain 10/10/2020   Menopausal symptom 11/30/2019   Urinary incontinence 11/30/2019   Status post Nissen fundoplication 10/01/2019   Asthma with status asthmaticus    Depression 09/10/2019   Murmur 07/16/2018   Hiatal hernia with GERD without esophagitis 01/10/2018   Allergic rhinitis 05/26/2017   Carpal tunnel syndrome 05/26/2017   Former smoker 05/26/2017   Major depression in full remission (HCC) 05/08/2017   GAD (generalized anxiety disorder) 05/08/2017   Iron deficiency 02/20/2016   Osteoarthritis 03/13/2015   Cough 03/13/2015   Back pain 11/10/2014   Rash and  nonspecific skin eruption 07/21/2014   Cold feeling 05/03/2014   Sinusitis, chronic 06/14/2013   Anemia 04/16/2013   Extrinsic asthma, mild persistent, uncomplicated 03/20/2013   Upper airway cough syndrome ? all related to sinus dz 03/19/2013   Essential hypertension, benign 04/15/2012   Obesity 04/15/2012   Hyperlipidemia 04/15/2012    Allergies:  Allergies  Allergen Reactions   Statins     Myalgias with all statins   Amoxicillin Hives and Rash   Penicillins Hives, Other (See Comments) and Rash    Medications:  Current Outpatient Medications:    acidophilus (RISAQUAD) CAPS capsule, Take 1 capsule by mouth in the morning and at bedtime., Disp: , Rfl:    albuterol (PROVENTIL HFA;VENTOLIN HFA) 108 (90 Base) MCG/ACT inhaler, Inhale 2 puffs into the lungs every 6 (six) hours as needed for wheezing or shortness of breath., Disp: 1 Inhaler, Rfl: 2   ascorbic acid (VITAMIN C) 500 MG tablet, Take 500 mg by mouth daily., Disp: , Rfl:    BREO ELLIPTA 100-25 MCG/INH AEPB, INHALE 1 PUFF INTO THE LUNGS DAILY, Disp: 60 each, Rfl: 5   cholecalciferol (VITAMIN D3) 25 MCG (1000 UT) tablet, Take 1,000 Units by mouth daily., Disp: , Rfl:    Cyanocobalamin (VITAMIN B 12) 500 MCG TABS, Take 1,000 mcg by mouth daily at 12 noon., Disp: , Rfl:    diclofenac (VOLTAREN) 75 MG EC tablet, TAKE 1 TABLET(75 MG) BY MOUTH TWICE DAILY AS NEEDED FOR MODERATE PAIN, Disp: 90 tablet, Rfl: 0   escitalopram (LEXAPRO) 20 MG tablet, TAKE 1 TABLET BY MOUTH EVERY DAY, Disp: 90 tablet, Rfl: 1   esomeprazole (NEXIUM) 40 MG capsule, Take 1 capsule (40 mg total) by mouth 2 (two) times daily., Disp: 60 capsule, Rfl: 3   ezetimibe (ZETIA) 10 MG tablet, TAKE 1 TABLET BY MOUTH EVERY DAY, Disp: 90 tablet, Rfl: 1   fluticasone (FLONASE) 50 MCG/ACT nasal spray, SHAKE LIQUID AND USE 1 SPRAY IN EACH NOSTRIL DAILY, Disp: 16 g, Rfl: 5   losartan (COZAAR) 50 MG tablet, TAKE 1 TABLET(50 MG) BY MOUTH DAILY, Disp: 90 tablet, Rfl: 2   montelukast (SINGULAIR) 10 MG tablet, TAKE 1 TABLET(10 MG) BY MOUTH AT BEDTIME, Disp: 90 tablet, Rfl: 3   Multiple Vitamin (MULTIVITAMIN WITH MINERALS) TABS tablet, Take 1 tablet by mouth daily., Disp: , Rfl:   Observations/Objective: Patient is well-developed, well-nourished in no acute distress.  Resting comfortably  at home.  Head is normocephalic, atraumatic.  No labored breathing.  Speech is clear and coherent with logical content.  Patient is alert and oriented at baseline.  Deep wet cough Voice  hoarse  Assessment and Plan:  [name] in today with chief complaint of URI   1. Chest congestion 2. Acute cough 1. Take meds as prescribed 2. Use a cool mist humidifier especially during the winter months and when heat has been humid. 3. Use saline nose sprays frequently 4. Saline irrigations of the nose can be very helpful if done frequently.  * 4X daily for 1 week*  * Use of a nettie pot can be helpful with this. Follow directions with this* 5. Drink plenty of fluids 6. Keep thermostat turn down low 7.For any cough or congestion  Use plain Mucinex- regular strength or max strength is fine   * Children- consult with Pharmacist for dosing 8. For fever or aces or pains- take tylenol or ibuprofen appropriate for age and weight.  * for fevers greater than 101 orally you may alternate  ibuprofen and tylenol every  3 hours.   Meds ordered this encounter  Medications   predniSONE (DELTASONE) 20 MG tablet    Sig: Take 2 tablets (40 mg total) by mouth daily with breakfast for 5 days. 2 po daily for 5 days    Dispense:  10 tablet    Refill:  0    Order Specific Question:   Supervising Provider    Answer:   Eber Hong [[id number]]   promethazine-dextromethorphan (PROMETHAZINE-DM) 6.25-15 MG/5ML syrup    Sig: Take 5 mLs by mouth 4 (four) times daily as needed for cough.    Dispense:  118 mL    Refill:  0    Order Specific Question:   Supervising Provider    Answer:   Eber Hong [[id number]]      Follow Up Instructions: I discussed the assessment and treatment plan with the patient. The patient was provided an opportunity to ask questions and all were answered. The patient agreed with the plan and demonstrated an understanding of the instructions.  A copy of instructions were sent to the patient via MyChart.  The patient was advised to call back or seek an in-person evaluation if the symptoms worsen or if the condition fails to improve as anticipated.  Time:  I spent 10 minutes  with the patient via telehealth technology discussing the above problems/concerns.    Shannon Daphine Deutscher, FNP

## 2021-05-30 DIAGNOSIS — K2210 Ulcer of esophagus without bleeding: Secondary | ICD-10-CM | POA: Diagnosis not present

## 2021-05-30 DIAGNOSIS — M79672 Pain in left foot: Secondary | ICD-10-CM | POA: Diagnosis not present

## 2021-05-30 DIAGNOSIS — M2550 Pain in unspecified joint: Secondary | ICD-10-CM | POA: Diagnosis not present

## 2021-05-30 DIAGNOSIS — M79641 Pain in right hand: Secondary | ICD-10-CM | POA: Diagnosis not present

## 2021-05-30 DIAGNOSIS — M79642 Pain in left hand: Secondary | ICD-10-CM | POA: Diagnosis not present

## 2021-05-30 DIAGNOSIS — M79671 Pain in right foot: Secondary | ICD-10-CM | POA: Diagnosis not present

## 2021-05-30 DIAGNOSIS — M1990 Unspecified osteoarthritis, unspecified site: Secondary | ICD-10-CM | POA: Diagnosis not present

## 2021-05-30 DIAGNOSIS — K219 Gastro-esophageal reflux disease without esophagitis: Secondary | ICD-10-CM | POA: Diagnosis not present

## 2021-05-31 ENCOUNTER — Telehealth: Payer: Self-pay

## 2021-05-31 DIAGNOSIS — B372 Candidiasis of skin and nail: Secondary | ICD-10-CM | POA: Diagnosis not present

## 2021-05-31 NOTE — Telephone Encounter (Signed)
No problem on my end- may take time to get back in is only thing

## 2021-05-31 NOTE — Telephone Encounter (Signed)
LVM for pt to call the office to schedule TOC appt.

## 2021-05-31 NOTE — Telephone Encounter (Signed)
Dr. Selena Batten is out on maternity leave but I do not foresee her having a problem with the transfer. I will approve in her absence.

## 2021-05-31 NOTE — Telephone Encounter (Signed)
Pt called wanting to know is she can transfer from Dr Selena Batten back to Dr Durene Cal. Pt stated that Dr Erasmo Leventhal office is closer to where she lives. Can we schedule TOC?

## 2021-06-05 DIAGNOSIS — J020 Streptococcal pharyngitis: Secondary | ICD-10-CM | POA: Diagnosis not present

## 2021-06-07 ENCOUNTER — Other Ambulatory Visit: Payer: Self-pay | Admitting: Gynecology

## 2021-06-07 DIAGNOSIS — R928 Other abnormal and inconclusive findings on diagnostic imaging of breast: Secondary | ICD-10-CM

## 2021-06-08 DIAGNOSIS — N904 Leukoplakia of vulva: Secondary | ICD-10-CM | POA: Diagnosis not present

## 2021-06-08 DIAGNOSIS — N9089 Other specified noninflammatory disorders of vulva and perineum: Secondary | ICD-10-CM | POA: Diagnosis not present

## 2021-06-08 DIAGNOSIS — N898 Other specified noninflammatory disorders of vagina: Secondary | ICD-10-CM | POA: Diagnosis not present

## 2021-06-20 DIAGNOSIS — L900 Lichen sclerosus et atrophicus: Secondary | ICD-10-CM | POA: Diagnosis not present

## 2021-07-12 ENCOUNTER — Other Ambulatory Visit: Payer: Self-pay | Admitting: Family Medicine

## 2021-07-13 ENCOUNTER — Telehealth: Payer: Self-pay

## 2021-07-13 MED ORDER — ESOMEPRAZOLE MAGNESIUM 40 MG PO CPDR: 40.0000 mg | DELAYED_RELEASE_CAPSULE | Freq: Two times a day (BID) | ORAL | 0 refills | Status: DC

## 2021-07-13 NOTE — Telephone Encounter (Signed)
[facility] Night - Client TELEPHONE ADVICE RECORD Access[employer]urse Patient Name: Shannon Mckenzie FOR BES Gender: Female DOB: 1960-07-15 Age: 61 Y 29 D Return Phone Number: [phone] (Primary) Address: City/ State/ Zip: [place] [address] Client [facility] Night - Client Client Site [facility] - Night Provider Gweneth Dimitri- MD Contact Type Call Who Is Calling Patient / Member / Family / Caregiver Call Type Triage / Clinical Relationship To Patient Self Return Phone Number [phone] (Primary) Chief Complaint Prescription Refill or Medication Request (non symptomatic) Reason for Call Medication Question / Request Initial Comment Caller was given office hours and would like to refill an Rx. Nexium is one medication that needs to be refilled. Esometradole. Caller has enough to last another day, but she needs it refilled but she has to talk to the doctor first. Caller said that ever since she was taken off of these medications, her stomach problems started so she needs to be put back on these medication. Translation No Nurse Assessment Nurse: Kennyth Arnold, RN, Kaitlyn Date/Time (Eastern Time): 07/12/2021 6:34:33 PM Confirm and document reason for call. If symptomatic, describe symptoms. ---The caller states that she is wanting a refill of Nexium, but she does have enough to last until office opens. The caller went to the pharmacy to get a refill, but the pharmacy told her that she needed to speak with PCP before a refill could be completed. The caller is denying any new/worsening SX at this time. Does the patient have any new or worsening symptoms? ---No Nurse: Kennyth Arnold, RN, Kaitlyn Date/Time (Eastern Time): 07/12/2021 6:37:10 PM Please select the assessment type ---Refill Does the patient have enough medication to last until the office opens? ---Yes Additional Documentation ---This RN advised the caller to contact  the office upon opening in the morning, but may utilize this service with any new/worsening SX or questions/concerns. The caller verbalized understanding. Disp. Time Lamount Cohen Time) Disposition Final User 07/12/2021 5:47:42 PM Attempt made - message left Kennyth Arnold, RN, Yvonna Alanis PLEASE NOTE: All timestamps contained within this report are represented as [place] Standard Time. CONFIDENTIALTY NOTICE: This fax transmission is intended only for the addressee. It contains information that is legally privileged, confidential or otherwise protected from use or disclosure. If you are not the intended recipient, you are strictly prohibited from reviewing, disclosing, copying using or disseminating any of this information or taking any action in reliance on or regarding this information. If you have received this fax in error, please notify us immediately by telephone so that we can arrange for its return to [place]. Phone: [phone], Toll-Free: [phone], Fax: [phone] Page: 2 of 2 Call Id: [id number] Disp. Time Lamount Cohen Time) Disposition Final User 07/12/2021 6:10:34 PM Attempt made - message left Kennyth Arnold, RN, [facility] 07/12/2021 6:38:01 PM Clinical Call Yes Kennyth Arnold, RN, Marko Plume

## 2021-07-13 NOTE — Telephone Encounter (Signed)
Refilled for 30 days. Pt notified and told to schedule an appt for further refills.

## 2021-07-17 ENCOUNTER — Other Ambulatory Visit: Payer: Self-pay

## 2021-07-17 ENCOUNTER — Ambulatory Visit: Payer: BC Managed Care – PPO

## 2021-07-17 ENCOUNTER — Ambulatory Visit
Admission: RE | Admit: 2021-07-17 | Discharge: 2021-07-17 | Disposition: A | Discharge status: Home / Self Care | Payer: BC Managed Care – PPO | Source: Ambulatory Visit | Attending: Gynecology | Admitting: Gynecology

## 2021-07-17 DIAGNOSIS — R928 Other abnormal and inconclusive findings on diagnostic imaging of breast: Secondary | ICD-10-CM

## 2021-07-17 DIAGNOSIS — R922 Inconclusive mammogram: Secondary | ICD-10-CM | POA: Diagnosis not present

## 2021-07-31 DIAGNOSIS — D225 Melanocytic nevi of trunk: Secondary | ICD-10-CM | POA: Diagnosis not present

## 2021-07-31 DIAGNOSIS — L814 Other melanin hyperpigmentation: Secondary | ICD-10-CM | POA: Diagnosis not present

## 2021-07-31 DIAGNOSIS — D485 Neoplasm of uncertain behavior of skin: Secondary | ICD-10-CM | POA: Diagnosis not present

## 2021-07-31 DIAGNOSIS — L821 Other seborrheic keratosis: Secondary | ICD-10-CM | POA: Diagnosis not present

## 2021-07-31 DIAGNOSIS — L570 Actinic keratosis: Secondary | ICD-10-CM | POA: Diagnosis not present

## 2021-08-09 ENCOUNTER — Other Ambulatory Visit: Payer: Self-pay | Admitting: Family Medicine

## 2021-08-24 DIAGNOSIS — H04123 Dry eye syndrome of bilateral lacrimal glands: Secondary | ICD-10-CM | POA: Diagnosis not present

## 2021-08-24 DIAGNOSIS — H5203 Hypermetropia, bilateral: Secondary | ICD-10-CM | POA: Diagnosis not present

## 2021-09-26 ENCOUNTER — Other Ambulatory Visit: Payer: Self-pay

## 2021-09-26 DIAGNOSIS — F411 Generalized anxiety disorder: Secondary | ICD-10-CM

## 2021-09-26 DIAGNOSIS — F325 Major depressive disorder, single episode, in full remission: Secondary | ICD-10-CM

## 2021-09-26 MED ORDER — ESCITALOPRAM OXALATE 20 MG PO TABS: 20.0000 mg | ORAL_TABLET | Freq: Every day | ORAL | 0 refills | Status: DC

## 2021-10-16 LAB — LIPID PANEL
Cholesterol: 230 — AB (ref 0–200)
HDL: 55 (ref 35–70)
LDL Cholesterol: 163
Triglycerides: 68 (ref 40–160)

## 2021-10-16 LAB — COMPREHENSIVE METABOLIC PANEL
Albumin: 4.1 (ref 3.5–5.0)
Calcium: 9.3 (ref 8.7–10.7)
Globulin: 2.3
eGFR: 90
eGFR: 90

## 2021-10-16 LAB — BASIC METABOLIC PANEL
BUN: 18 (ref 4–21)
BUN: 18 (ref 4–21)
CO2: 23 — AB (ref 13–22)
Chloride: 106 (ref 99–108)
Creatinine: 0.8 (ref 0.5–1.1)
Creatinine: 0.8 (ref 0.5–1.1)
Glucose: 92
Glucose: 92
Potassium: 4.3 mEq/L (ref 3.5–5.1)
Sodium: 142 (ref 137–147)

## 2021-10-16 LAB — HEPATIC FUNCTION PANEL
ALT: 10 U/L (ref 7–35)
AST: 18 (ref 13–35)
Alkaline Phosphatase: 108 (ref 25–125)
Bilirubin, Total: 0.3

## 2021-10-16 LAB — HEMOGLOBIN A1C: Hemoglobin A1C: 5.3

## 2021-10-25 ENCOUNTER — Other Ambulatory Visit: Payer: Self-pay | Admitting: Family Medicine

## 2021-10-25 DIAGNOSIS — L821 Other seborrheic keratosis: Secondary | ICD-10-CM | POA: Diagnosis not present

## 2021-10-25 DIAGNOSIS — S80861A Insect bite (nonvenomous), right lower leg, initial encounter: Secondary | ICD-10-CM | POA: Diagnosis not present

## 2021-10-25 DIAGNOSIS — Z09 Encounter for follow-up examination after completed treatment for conditions other than malignant neoplasm: Secondary | ICD-10-CM | POA: Diagnosis not present

## 2021-10-25 DIAGNOSIS — L570 Actinic keratosis: Secondary | ICD-10-CM | POA: Diagnosis not present

## 2021-10-25 DIAGNOSIS — D485 Neoplasm of uncertain behavior of skin: Secondary | ICD-10-CM | POA: Diagnosis not present

## 2021-10-25 DIAGNOSIS — Z872 Personal history of diseases of the skin and subcutaneous tissue: Secondary | ICD-10-CM | POA: Diagnosis not present

## 2021-10-29 ENCOUNTER — Other Ambulatory Visit: Payer: Self-pay | Admitting: Family Medicine

## 2021-10-29 DIAGNOSIS — F325 Major depressive disorder, single episode, in full remission: Secondary | ICD-10-CM

## 2021-10-29 DIAGNOSIS — F411 Generalized anxiety disorder: Secondary | ICD-10-CM

## 2021-11-10 ENCOUNTER — Other Ambulatory Visit: Payer: Self-pay | Admitting: Family Medicine

## 2021-11-17 ENCOUNTER — Other Ambulatory Visit: Payer: Self-pay | Admitting: Family Medicine

## 2021-11-22 ENCOUNTER — Other Ambulatory Visit: Payer: Self-pay | Admitting: Family Medicine

## 2021-11-22 MED ORDER — EZETIMIBE 10 MG PO TABS: 10.0000 mg | ORAL_TABLET | Freq: Every day | ORAL | 0 refills | Status: DC

## 2021-11-22 NOTE — Telephone Encounter (Signed)
Patient is calling in asking if we can give a short refill as she has a TOC scheduled for Monday with Dr.Hunter.

## 2021-11-26 ENCOUNTER — Encounter: Payer: BC Managed Care – PPO | Admitting: Family Medicine

## 2021-12-10 ENCOUNTER — Other Ambulatory Visit: Payer: Self-pay | Admitting: Family Medicine

## 2021-12-11 ENCOUNTER — Other Ambulatory Visit: Payer: Self-pay | Admitting: Family Medicine

## 2021-12-12 ENCOUNTER — Other Ambulatory Visit: Payer: Self-pay | Admitting: Family Medicine

## 2021-12-20 ENCOUNTER — Telehealth: Payer: Self-pay | Admitting: Family Medicine

## 2021-12-20 NOTE — Telephone Encounter (Signed)
She is overdue for labs and office visit. She schedule an appointment to be seen and labs rechecked.   I've already filled 30 days while she waits to see new PCP.

## 2021-12-21 NOTE — Telephone Encounter (Signed)
Patient is scheduled for 01/07/22.

## 2022-01-07 ENCOUNTER — Ambulatory Visit: Payer: BC Managed Care – PPO | Admitting: Family Medicine

## 2022-01-07 VITALS — BP 130/80 | HR 70 | Temp 97.8°F | Ht 61.9 in | Wt 188.2 lb

## 2022-01-07 DIAGNOSIS — E785 Hyperlipidemia, unspecified: Secondary | ICD-10-CM

## 2022-01-07 DIAGNOSIS — Z789 Other specified health status: Secondary | ICD-10-CM

## 2022-01-07 DIAGNOSIS — F325 Major depressive disorder, single episode, in full remission: Secondary | ICD-10-CM

## 2022-01-07 DIAGNOSIS — F411 Generalized anxiety disorder: Secondary | ICD-10-CM

## 2022-01-07 MED ORDER — EZETIMIBE 10 MG PO TABS: 10.0000 mg | ORAL_TABLET | Freq: Every day | ORAL | 0 refills | Status: DC

## 2022-01-07 MED ORDER — BUSPIRONE HCL 7.5 MG PO TABS: 7.5000 mg | ORAL_TABLET | Freq: Two times a day (BID) | ORAL | 1 refills | Status: DC

## 2022-01-07 NOTE — Assessment & Plan Note (Signed)
Pt notes failure of multiple statin therapies.  She is tolerating Zetia, discussed lifestyle changes given Mediterranean diet today.  Encouraged trying omega-3's.  We will also place referral to lipid clinic if lifestyle changes do not improve her cholesterol.

## 2022-01-07 NOTE — Assessment & Plan Note (Signed)
She has having some persistent symptoms, working with therapy and on Lexapro.  Most of her upsetting symptoms are related to anxiety so we will try BuSpar first.  Discussed if limited response or worsening of her depression symptoms would consider Wellbutrin.

## 2022-01-07 NOTE — Patient Instructions (Addendum)
Your cholesterol is high.  You can work to lower cholesterol through diet and exercise.  I would recommend considering the following:   1) Diet -Mediterranean diet, vegetarian or meat restricted diet, low carbohydrate diet, avoiding trans fats, or the Dash diet.   2) You can consider trying omega-3 fatty acids or red yeast rice which may have some benefit.  But dietary changes are better than supplements.  3) Exercise - I recommended 30 minutes of brisk walking 5-7 days a week   Anxiety - start Buspar 7.5 mg twice daily - update in 2 weeks if no improvement and can increase your dose - see Dr. Durene Cal  Constipation   Constipation is a common issue. Often it is related to diet and occasionally medications.    What you can do to treat your symptoms 1) Fiber -- Eat more fiber rich foods: beans, broccoli, berries, avocados, popcorn, pear/apple, green peas, turnip greens, brussels sprouts, whole grains (barley, bran, quinoa, oatmeal) -- Take a Fiber supplement: Psyllium (Metamucil)  -- Could also eat Prunes daily  2) Hydration  -- Drink more water: Try to drink 64 oz of water per day  3) Exercise -- Moderate exercise (walking, jogging, biking) for 30 minutes, 5 days a week  4) Dedicate time for Bowel movements - do not delay  5) Stool Softener  - Docusate Sodium (Colace) 100 mg daily or twice daily as needed      Mediterranean Diet A Mediterranean diet refers to food and lifestyle choices that are based on the traditions of countries located on the [employer]. It focuses on eating more fruits, vegetables, whole grains, beans, nuts, seeds, and heart-healthy fats, and eating less dairy, meat, eggs, and processed foods with added sugar, salt, and fat. This way of eating has been shown to help prevent certain conditions and improve outcomes for people who have chronic diseases, like kidney disease and heart disease. What are tips for following this plan? Reading food  labels Check the serving size of packaged foods. For foods such as rice and pasta, the serving size refers to the amount of cooked product, not dry. Check the total fat in packaged foods. Avoid foods that have saturated fat or trans fats. Check the ingredient list for added sugars, such as corn syrup. Shopping  Buy a variety of foods that offer a balanced diet, including: Fresh fruits and vegetables (produce). Grains, beans, nuts, and seeds. Some of these may be available in unpackaged forms or large amounts (in bulk). Fresh seafood. Poultry and eggs. Low-fat dairy products. Buy whole ingredients instead of prepackaged foods. Buy fresh fruits and vegetables in-season from local farmers markets. Buy plain frozen fruits and vegetables. If you do not have access to quality fresh seafood, buy precooked frozen shrimp or canned fish, such as tuna, salmon, or sardines. Stock your pantry so you always have certain foods on hand, such as olive oil, canned tuna, canned tomatoes, rice, pasta, and beans. Cooking Cook foods with extra-virgin olive oil instead of using butter or other vegetable oils. Have meat as a side dish, and have vegetables or grains as your main dish. This means having meat in small portions or adding small amounts of meat to foods like pasta or stew. Use beans or vegetables instead of meat in common dishes like chili or lasagna. Experiment with different cooking methods. Try roasting, broiling, steaming, and sauting vegetables. Add frozen vegetables to soups, stews, pasta, or rice. Add nuts or seeds for added healthy fats and plant  protein at each meal. You can add these to yogurt, salads, or vegetable dishes. Marinate fish or vegetables using olive oil, lemon juice, garlic, and fresh herbs. Meal planning Plan to eat one vegetarian meal one day each week. Try to work up to two vegetarian meals, if possible. Eat seafood two or more times a week. Have healthy snacks readily  available, such as: Vegetable sticks with hummus. Greek yogurt. Fruit and nut trail mix. Eat balanced meals throughout the week. This includes: Fruit: 2-3 servings a day. Vegetables: 4-5 servings a day. Low-fat dairy: 2 servings a day. Fish, poultry, or lean meat: 1 serving a day. Beans and legumes: 2 or more servings a week. Nuts and seeds: 1-2 servings a day. Whole grains: 6-8 servings a day. Extra-virgin olive oil: 3-4 servings a day. Limit red meat and sweets to only a few servings a month. Lifestyle  Cook and eat meals together with your family, when possible. Drink enough fluid to keep your urine pale yellow. Be physically active every day. This includes: Aerobic exercise like running or swimming. Leisure activities like gardening, walking, or housework. Get 7-8 hours of sleep each night. If recommended by your health care provider, drink red wine in moderation. This means 1 glass a day for nonpregnant women and 2 glasses a day for men. A glass of wine equals 5 oz (150 mL). What foods should I eat? Fruits Apples. Apricots. Avocado. Berries. Bananas. Cherries. Dates. Figs. Grapes. Lemons. Melon. Oranges. Peaches. Plums. Pomegranate. Vegetables Artichokes. Beets. Broccoli. Cabbage. Carrots. Eggplant. Green beans. Chard. Kale. Spinach. Onions. Leeks. Peas. Squash. Tomatoes. Peppers. Radishes. Grains Whole-grain pasta. Brown rice. Bulgur wheat. Polenta. Couscous. Whole-wheat bread. Orpah Cobb. Meats and other proteins Beans. Almonds. Sunflower seeds. Pine nuts. Peanuts. Cod. Salmon. Scallops. Shrimp. Tuna. Tilapia. Clams. Oysters. Eggs. Poultry without skin. Dairy Low-fat milk. Cheese. Greek yogurt. Fats and oils Extra-virgin olive oil. Avocado oil. Grapeseed oil. Beverages Water. Red wine. Herbal tea. Sweets and desserts Greek yogurt with honey. Baked apples. Poached pears. Trail mix. Seasonings and condiments Basil. Cilantro. Coriander. Cumin. Mint. Parsley. Sage.  Rosemary. Tarragon. Garlic. Oregano. Thyme. Pepper. Balsamic vinegar. Tahini. Hummus. Tomato sauce. Olives. Mushrooms. The items listed above may not be a complete list of foods and beverages you can eat. Contact a dietitian for more information. What foods should I limit? This is a list of foods that should be eaten rarely or only on special occasions. Fruits Fruit canned in syrup. Vegetables Deep-fried potatoes (french fries). Grains Prepackaged pasta or rice dishes. Prepackaged cereal with added sugar. Prepackaged snacks with added sugar. Meats and other proteins Beef. Pork. Lamb. Poultry with skin. Hot dogs. Tomasa Blase. Dairy Ice cream. Sour cream. Whole milk. Fats and oils Butter. Canola oil. Vegetable oil. Beef fat (tallow). Lard. Beverages Juice. Sugar-sweetened soft drinks. Beer. Liquor and spirits. Sweets and desserts Cookies. Cakes. Pies. Candy. Seasonings and condiments Mayonnaise. Pre-made sauces and marinades. The items listed above may not be a complete list of foods and beverages you should limit. Contact a dietitian for more information. Summary The Mediterranean diet includes both food and lifestyle choices. Eat a variety of fresh fruits and vegetables, beans, nuts, seeds, and whole grains. Limit the amount of red meat and sweets that you eat. If recommended by your health care provider, drink red wine in moderation. This means 1 glass a day for nonpregnant women and 2 glasses a day for men. A glass of wine equals 5 oz (150 mL). This information is not intended to replace advice  given to you by your health care provider. Make sure you discuss any questions you have with your health care provider. Document Revised: 07/16/2019 Document Reviewed: 05/13/2019 Elsevier Patient Education  2023 [employer].

## 2022-01-07 NOTE — Progress Notes (Signed)
Subjective:     Shannon Mckenzie is a 61 y.o. female presenting for Medication Refill and Anxiety     Medication Refill  Anxiety      #Anxiety - is seeing a counselor  - taking lexapro 20 mg - is getting more depressed if not busy - working - is also fidgety when she doesn't have things going on - will feel nervous and need to be alone   #Cholesterol - statins have caused significant pain -   Review of Systems   Social History   Tobacco Use  Smoking Status Former   Packs/day: 1.00   Years: 15.00   Total pack years: 15.00   Types: Cigarettes   Quit date: 06/25/1991   Years since quitting: 30.5  Smokeless Tobacco Never        Objective:    BP Readings from Last 3 Encounters:  01/07/22 130/80  04/02/21 124/86  10/10/20 110/80   Wt Readings from Last 3 Encounters:  01/07/22 188 lb 4 oz (85.4 kg)  04/02/21 196 lb (88.9 kg)  10/10/20 198 lb (89.8 kg)    BP 130/80   Pulse 70   Temp 97.8 F (36.6 C) (Temporal)   Ht 5' 1.9" (1.572 m)   Wt 188 lb 4 oz (85.4 kg)   LMP 03/13/2015   SpO2 96%   BMI 34.54 kg/m    Physical Exam Constitutional:      General: She is not in acute distress.    Appearance: She is well-developed. She is not diaphoretic.  HENT:     Right Ear: External ear normal.     Left Ear: External ear normal.     Nose: Nose normal.  Eyes:     Conjunctiva/sclera: Conjunctivae normal.  Cardiovascular:     Rate and Rhythm: Normal rate.  Pulmonary:     Effort: Pulmonary effort is normal.  Musculoskeletal:     Cervical back: Neck supple.  Skin:    General: Skin is warm and dry.     Capillary Refill: Capillary refill takes less than 2 seconds.  Neurological:     Mental Status: She is alert. Mental status is at baseline.  Psychiatric:        Mood and Affect: Mood normal.        Behavior: Behavior normal.       10/10/2020    3:09 PM 05/26/2017   10:50 AM 05/12/2017    3:45 PM  GAD 7 : Generalized Anxiety Score  Nervous,  Anxious, on Edge 0 1 2  Control/stop worrying 0 2 2  Worry too much - different things 0 1 2  Trouble relaxing 0 1 2  Restless 0 0 2  Easily annoyed or irritable 0 1 2  Afraid - awful might happen 0 0 2  Total GAD 7 Score 0 6 14  Anxiety Difficulty Not difficult at all Somewhat difficult Somewhat difficult       10/10/2020    3:09 PM 09/14/2019   10:04 AM 11/12/2018    2:01 PM  Depression screen PHQ 2/9  Decreased Interest 0 0 0  Down, Depressed, Hopeless 0 0 0  PHQ - 2 Score 0 0 0  Altered sleeping  1 0  Tired, decreased energy  0 0  Change in appetite  1 1  Feeling bad or failure about yourself   0 0  Trouble concentrating  0 0  Moving slowly or fidgety/restless  0 0  Suicidal thoughts  0 0  PHQ-9  Score  2 1  Difficult doing work/chores  Not difficult at all Not difficult at all          Assessment & Plan:   Problem List Items Addressed This Visit       Other   Hyperlipidemia - Primary   Relevant Medications   ezetimibe (ZETIA) 10 MG tablet   Major depression in full remission (HCC)    She has having some persistent symptoms, working with therapy and on Lexapro.  Most of her upsetting symptoms are related to anxiety so we will try BuSpar first.  Discussed if limited response or worsening of her depression symptoms would consider Wellbutrin.      Relevant Medications   busPIRone (BUSPAR) 7.5 MG tablet   GAD (generalized anxiety disorder)    Symptoms are slightly worse.  She is working with therapy.  Continue Lexapro 20 mg, start BuSpar 7.5 mg twice daily.  Update in a week or 2 if symptoms are not improving and could consider dose increase.      Relevant Medications   busPIRone (BUSPAR) 7.5 MG tablet   Statin intolerance    Pt notes failure of multiple statin therapies.  She is tolerating Zetia, discussed lifestyle changes given Mediterranean diet today.  Encouraged trying omega-3's.  We will also place referral to lipid clinic if lifestyle changes do not  improve her cholesterol.      Relevant Medications   ezetimibe (ZETIA) 10 MG tablet   Other Relevant Orders   AMB Referral to [facility]     Return if symptoms worsen or fail to improve.  [name], MD

## 2022-01-07 NOTE — Assessment & Plan Note (Signed)
Symptoms are slightly worse.  She is working with therapy.  Continue Lexapro 20 mg, start BuSpar 7.5 mg twice daily.  Update in a week or 2 if symptoms are not improving and could consider dose increase.

## 2022-01-26 ENCOUNTER — Other Ambulatory Visit: Payer: Self-pay | Admitting: Family Medicine

## 2022-02-05 ENCOUNTER — Other Ambulatory Visit: Payer: Self-pay | Admitting: Family Medicine

## 2022-02-07 ENCOUNTER — Encounter: Payer: Self-pay | Admitting: Family Medicine

## 2022-02-07 ENCOUNTER — Ambulatory Visit: Payer: BC Managed Care – PPO | Admitting: Family Medicine

## 2022-02-07 VITALS — BP 112/70 | HR 71 | Temp 98.2°F | Ht 61.9 in | Wt 189.2 lb

## 2022-02-07 DIAGNOSIS — Z0000 Encounter for general adult medical examination without abnormal findings: Secondary | ICD-10-CM | POA: Diagnosis not present

## 2022-02-07 DIAGNOSIS — Z79899 Other long term (current) drug therapy: Secondary | ICD-10-CM | POA: Diagnosis not present

## 2022-02-07 DIAGNOSIS — L900 Lichen sclerosus et atrophicus: Secondary | ICD-10-CM

## 2022-02-07 DIAGNOSIS — J4530 Mild persistent asthma, uncomplicated: Secondary | ICD-10-CM

## 2022-02-07 DIAGNOSIS — Z789 Other specified health status: Secondary | ICD-10-CM

## 2022-02-07 DIAGNOSIS — I10 Essential (primary) hypertension: Secondary | ICD-10-CM

## 2022-02-07 DIAGNOSIS — E785 Hyperlipidemia, unspecified: Secondary | ICD-10-CM | POA: Diagnosis not present

## 2022-02-07 DIAGNOSIS — F411 Generalized anxiety disorder: Secondary | ICD-10-CM

## 2022-02-07 DIAGNOSIS — M1990 Unspecified osteoarthritis, unspecified site: Secondary | ICD-10-CM

## 2022-02-07 DIAGNOSIS — F325 Major depressive disorder, single episode, in full remission: Secondary | ICD-10-CM

## 2022-02-07 LAB — CBC WITH DIFFERENTIAL/PLATELET
Basophils Absolute: 0.1 10*3/uL (ref 0.0–0.1)
Basophils Relative: 1.2 % (ref 0.0–3.0)
Eosinophils Absolute: 0.4 10*3/uL (ref 0.0–0.7)
Eosinophils Relative: 7 % — ABNORMAL HIGH (ref 0.0–5.0)
HCT: 40 % (ref 36.0–46.0)
Hemoglobin: 13.4 g/dL (ref 12.0–15.0)
Lymphocytes Relative: 26.6 % (ref 12.0–46.0)
Lymphs Abs: 1.4 10*3/uL (ref 0.7–4.0)
MCHC: 33.4 g/dL (ref 30.0–36.0)
MCV: 85.7 fl (ref 78.0–100.0)
Monocytes Absolute: 0.4 10*3/uL (ref 0.1–1.0)
Monocytes Relative: 7.6 % (ref 3.0–12.0)
Neutro Abs: 2.9 10*3/uL (ref 1.4–7.7)
Neutrophils Relative %: 57.6 % (ref 43.0–77.0)
Platelets: 168 10*3/uL (ref 150.0–400.0)
RBC: 4.67 Mil/uL (ref 3.87–5.11)
RDW: 13.6 % (ref 11.5–15.5)
WBC: 5.1 10*3/uL (ref 4.0–10.5)

## 2022-02-07 LAB — VITAMIN B12: Vitamin B-12: 625 pg/mL (ref 211–911)

## 2022-02-07 MED ORDER — ESCITALOPRAM OXALATE 20 MG PO TABS: 20.0000 mg | ORAL_TABLET | Freq: Every day | ORAL | 0 refills | Status: DC

## 2022-02-07 MED ORDER — BREO ELLIPTA 100-25 MCG/ACT IN AEPB: 1.0000 | INHALATION_SPRAY | Freq: Every day | RESPIRATORY_TRACT | 3 refills | Status: DC

## 2022-02-07 MED ORDER — FLUTICASONE PROPIONATE 50 MCG/ACT NA SUSP: NASAL | 3 refills | Status: DC

## 2022-02-07 MED ORDER — ALBUTEROL SULFATE HFA 108 (90 BASE) MCG/ACT IN AERS: 2.0000 | INHALATION_SPRAY | Freq: Four times a day (QID) | RESPIRATORY_TRACT | 2 refills | Status: DC | PRN

## 2022-02-07 MED ORDER — EZETIMIBE 10 MG PO TABS: 10.0000 mg | ORAL_TABLET | Freq: Every day | ORAL | 3 refills | Status: DC

## 2022-02-07 MED ORDER — BUSPIRONE HCL 7.5 MG PO TABS: 7.5000 mg | ORAL_TABLET | Freq: Two times a day (BID) | ORAL | 3 refills | Status: DC

## 2022-02-07 MED ORDER — MONTELUKAST SODIUM 10 MG PO TABS: ORAL_TABLET | ORAL | 3 refills | Status: DC

## 2022-02-07 MED ORDER — ESOMEPRAZOLE MAGNESIUM 40 MG PO CPDR: 40.0000 mg | DELAYED_RELEASE_CAPSULE | Freq: Two times a day (BID) | ORAL | 3 refills | Status: DC

## 2022-02-07 MED ORDER — LOSARTAN POTASSIUM 50 MG PO TABS: ORAL_TABLET | ORAL | 3 refills | Status: DC

## 2022-02-07 NOTE — Progress Notes (Signed)
Phone [phone]   Subjective:  Patient presents today for their annual physical and to reestablish care - last seen 2020- has been with [facility] still . Chief complaint-noted.   See problem oriented charting- Review of Systems  Constitutional:  Negative for chills and fever.  HENT:  Negative for congestion, nosebleeds and sinus pain.   Eyes:  Negative for blurred vision and double vision.  Respiratory:  Positive for shortness of breath (mild with asthma at times). Negative for cough.   Cardiovascular:  Negative for chest pain and palpitations.  Gastrointestinal:  Positive for blood in stool (streaks on side of stool - related to lichen sclerosus- up to date on colonoscopy) and constipation. Negative for abdominal pain, diarrhea, heartburn, melena, nausea and vomiting.  Genitourinary:  Positive for dysuria (rare). Negative for frequency.  Musculoskeletal:  Positive for back pain (occasional on lower back) and joint pain (doing better after hip replacement bilateral).  Skin:  Positive for itching (with lichen sclerosus) and rash (lichen sclerosus).  Neurological:  Negative for dizziness and headaches.  Endo/Heme/Allergies:  Negative for polydipsia. Bruises/bleeds easily.  Psychiatric/Behavioral:  Negative for depression and suicidal ideas. The patient is nervous/anxious.    The following were reviewed and entered/updated in epic: Past Medical History:  Diagnosis Date   Allergy    Anemia    Anxiety    Aspiration pneumonia (HCC) 09/10/2019   Asthma    Depression    DJD (degenerative joint disease) of hip    Former smoker    GERD (gastroesophageal reflux disease)    Hand pain, right    Heart murmur    Hiatal hernia    History of kidney stones    history   Hyperlipidemia    Hypertension    Kidney stone    Osteoarthritis    Pain, hip    right   Pneumonia    hx but no hospitalization   Reactive airway disease    RLS (restless legs syndrome)    Routine gynecological  examination    sees gynecology, [name] NP   SOB (shortness of breath) 9/14   cardiac eval with stress test and echo; Dr. [name]   Statin intolerance    Vision problems    wears contact lenses   Patient Active Problem List   Diagnosis Date Noted   Major depression in full remission (HCC) 05/08/2017    Priority: High   Lichen sclerosus 02/07/2022    Priority: Medium    Statin intolerance 01/07/2022    Priority: Medium    Status post Nissen fundoplication 10/01/2019    Priority: Medium    Murmur 07/16/2018    Priority: Medium    Hiatal hernia with GERD without esophagitis 01/10/2018    Priority: Medium    Carpal tunnel syndrome 05/26/2017    Priority: Medium    Former smoker 05/26/2017    Priority: Medium    GAD (generalized anxiety disorder) 05/08/2017    Priority: Medium    Osteoarthritis 03/13/2015    Priority: Medium    Anemia 04/16/2013    Priority: Medium    Extrinsic asthma, mild persistent, uncomplicated 03/20/2013    Priority: Medium    Upper airway cough syndrome ? all related to sinus dz 03/19/2013    Priority: Medium    Essential hypertension, benign 04/15/2012    Priority: Medium    Hyperlipidemia 04/15/2012    Priority: Medium    Rectal pain 10/10/2020    Priority: Low   Urinary incontinence 11/30/2019  Priority: Low   Allergic rhinitis 05/26/2017    Priority: Low   Iron deficiency 02/20/2016    Priority: Low   Cough 03/13/2015    Priority: Low   Back pain 11/10/2014    Priority: Low   Rash and nonspecific skin eruption 07/21/2014    Priority: Low   Cold feeling 05/03/2014    Priority: Low   Sinusitis, chronic 06/14/2013    Priority: Low   Obesity 04/15/2012    Priority: Low   Past Surgical History:  Procedure Laterality Date   bilateral hip replacement     nov 21st 2019 right, Jun 09 2018 left    CARPAL TUNNEL RELEASE Left 07/03/2017   Procedure: LEFT CARPAL TUNNEL RELEASE;  Surgeon: Betha Loa, MD;  Location: [facility];  Service: Orthopedics;  Laterality: Left;   COLONOSCOPY  06/2012   Dr. Ramon Dredge; repeat 10 years   HIATAL HERNIA REPAIR N/A 10/01/2019   Procedure: LAPAROSCOPIC REPAIR OF HIATAL HERNIA WITH NISSEN FUNDOPLICATION, UPPER ENDO, REMOVAL OF GASTRIC MASS;  Surgeon: [name], MD;  Location: [facility];  Service: General;  Laterality: N/A;   UPPER GI ENDOSCOPY N/A 07/24/2012   Hiatus Hernia,normal stomach, normal examined duodenum   WISDOM TOOTH EXTRACTION      Family History  Problem Relation Age of Onset   Arthritis Mother    Asthma Mother    Diabetes Mother    Cirrhosis Mother        died of liver nonalcoholic cirrhosis   COPD Mother    Heart disease Mother        CHF   Hypertension Mother    Kidney disease Mother    Liver disease Mother    Eating disorder Mother    Obesity Mother    Cancer Father        died of pancreatic cancer at age 45   Alcohol abuse Father    Asthma Sister        doesnt know history well   Drug abuse Sister    Other Sister    Drug abuse Sister        doesnt know history well   Depression Brother    Alcohol abuse Brother    Other Brother        died of MVA   Heart attack Maternal Grandfather 60   Stroke Maternal Aunt        died age 24yo of CVA    Medications- reviewed and updated Current Outpatient Medications  Medication Sig Dispense Refill   ascorbic acid (VITAMIN C) 500 MG tablet Take 500 mg by mouth daily.     cholecalciferol (VITAMIN D3) 25 MCG (1000 UT) tablet Take 1,000 Units by mouth daily.     Cyanocobalamin (VITAMIN B 12) 500 MCG TABS Take 1,000 mcg by mouth daily at 12 noon.     Multiple Vitamin (MULTIVITAMIN WITH MINERALS) TABS tablet Take 1 tablet by mouth daily.     albuterol (VENTOLIN HFA) 108 (90 Base) MCG/ACT inhaler Inhale 2 puffs into the lungs every 6 (six) hours as needed for wheezing or shortness of breath. 1 each 2   BREO ELLIPTA 100-25 MCG/ACT AEPB Inhale 1 puff into the lungs daily. 180 each 3   busPIRone  (BUSPAR) 7.5 MG tablet Take 1 tablet (7.5 mg total) by mouth 2 (two) times daily. 180 tablet 3   clobetasol cream (TEMOVATE) 0.05 % Apply 1 Application topically 2 (two) times daily. As needed for lichen sclerosus- from dermatology but would  be willing to fill as PCP     escitalopram (LEXAPRO) 20 MG tablet Take 1 tablet (20 mg total) by mouth daily. 90 tablet 0   esomeprazole (NEXIUM) 40 MG capsule Take 1 capsule (40 mg total) by mouth 2 (two) times daily. 180 capsule 3   ezetimibe (ZETIA) 10 MG tablet Take 1 tablet (10 mg total) by mouth daily. 90 tablet 3   fluticasone (FLONASE) 50 MCG/ACT nasal spray SHAKE LIQUID AND USE 1 SPRAY IN EACH NOSTRIL DAILY 48 g 3   losartan (COZAAR) 50 MG tablet TAKE 1 TABLET(50 MG) BY MOUTH DAILY. 90 tablet 3   montelukast (SINGULAIR) 10 MG tablet TAKE 1 TABLET(10 MG) BY MOUTH AT BEDTIME 90 tablet 3   No current facility-administered medications for this visit.    Allergies-reviewed and updated Allergies  Allergen Reactions   Statins     Myalgias with all statins   Amoxicillin Hives and Rash   Penicillins Hives, Other (See Comments) and Rash    Social History   Social History Narrative   From: [place]   Living: with Husband. 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       Enjoys: outdoors activity, spending time [place] peaceful   Objective  Objective:  BP 112/70   Pulse 71   Temp 98.2 F (36.8 C)   Ht 5' 1.9" (1.572 m)   Wt 189 lb 3.2 oz (85.8 kg)   LMP 03/13/2015   SpO2 98%   BMI 34.72 kg/m  Gen: NAD, resting comfortably HEENT: Mucous membranes are moist. Oropharynx normal Neck: no thyromegaly CV: RRR no murmurs (like the one heard previously -prior echo) rubs or gallops Lungs: CTAB no crackles, wheeze, rhonchi Abdomen:  soft/nontender/nondistended/normal bowel sounds. No rebound or guarding.  Ext: no edema Skin: warm, dry Neuro: grossly normal, moves all extremities, PERRLA   Assessment and Plan   61 y.o. female presenting for annual physical.  Health Maintenance counseling: 1. Anticipatory guidance: Patient counseled regarding regular dental exams -q6 months, eye exams -yearly,  avoiding smoking and second hand smoke , limiting alcohol to 1 beverage per day-well below , no illicit drugs .   2. Risk factor reduction:  Advised patient of need for regular exercise and diet rich and fruits and vegetables to reduce risk of heart attack and stroke.  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  Diet/weight management-stable from last visit with me in 2020- some variability in that time frame-she does not plan to make significant changes- doesn't like vegetables- consider calorie counting (myfitnesspal) or weight watchers .  Wt Readings from Last 3 Encounters:  02/07/22 189 lb 3.2 oz (85.8 kg)  01/07/22 188 lb 4 oz (85.4 kg)  04/02/21 196 lb (88.9 kg)  3. Immunizations/screenings/ancillary studies- flu shot will do with work and update [place] Immunization History  Administered Date(s) Administered   Influenza Split 07/06/2011, 03/20/2012   Influenza Whole 04/02/2001, 04/23/2002   Influenza, Seasonal, Injecte, Preservative Fre 04/25/2019   Influenza,inj,Quad PF,6+ Mos 03/19/2013, 02/23/2014   Influenza-Unspecified 03/24/2017, 02/22/2018, 03/24/2021   Janssen (J&J) SARS-COV-2 Vaccination 09/07/2019   Moderna Sars-Covid-2 Vaccination 06/16/2020   Pneumococcal Polysaccharide-23 03/20/2012   Td 02/16/2005   Tdap 07/06/2014   Zoster Recombinat (Shingrix) 07/16/2018, 11/09/2018  4. Cervical cancer screening-  05/17/20 with 5 year repeat  planned as normal and HPV negative- sees GYN regularly Jeani SowEve Key, NP with group with [facility] 5. Breast cancer screening-  breast exam with GYN and mammogram  05/17/20 on file - but had last year- we will request 6. Colon cancer screening -  07/13/19 with 5 year repeat planned 7. Skin cancer screening- sees derm at least yearly if not more for lichen sclerosus. advised regular sunscreen use. Denies worrisome, changing, or new skin lesions.  8. Birth control/STD check- postmenopausal/only active with husband 9. Osteoporosis screening at [age]- will plan on at 66 10. Smoking associated screening - formre smoker- 15 pack years but quit in early 90s- no regular screening required  Status of chronic or acute concerns   #gets labs each November with husbands work- only test not done is cbc.    #Hypertension S: medication:  losartan 50 mg BP Readings from Last 3 Encounters:  02/07/22 112/70  01/07/22 130/80  04/02/21 124/86  A/P: Controlled. Continue current medications.       #Hyperlipidemia S: Patient compliant with Zetia.  intolerant to even once a week rosuvastatin and multiple other statins. A/P: LDL has been over 190 in the past- has referral to lipid clinic in place- visit in December- discussed possibility of pcsk9 inhibitors- she will discuss     #Asthma S: Patient remains on Singulair and Breo.  Infrequent albuterol use- once a year. A/P: Controlled. Continue current medications.      #Osteoarthritis- s/p bilateral hip replacements- doing much beter.  S: Has seen physical therapy and rheumatology in the past-rheumatology told her she could be released to me.  A/P: no regular rx now- knows when needs to modify     #GERD with hital hernia history S: Compliant with Nexium 40 mg- up to BID Lab Results  Component Value Date   VITAMINB12 646 02/19/2018  A/P: Controlled. Continue current medications but encouraged to trial once a day again -check b12 with long term PPI use  - takes b12 though so likely fine   #Anemia S: Patient with history of EGD and colonoscopy back in 2016 for iron deficiency anemia.  She has been on iron since 2014 and  CBC is largely stable Lab Results  Component Value Date   WBC 8.3 10/01/2019   HGB 13.0 10/01/2019   HCT 40.3 10/01/2019   MCV 88.8 10/01/2019   PLT 183 10/01/2019  A/P: off iron recently- will update cbc to make sure stable     #Depression/GAD S: Controlled on Lexapro 20 mg.  buspirone 7.5 mg BID for anxiety element- much improved - Seeing counselor still every other week- helpful . No SI A/P: Depression/GAD reasonably well controlled-   continue current meds- update phq9 next visit  #allergies- takes flonase and singulair   Recommended follow up: Return in about 6 months (around 08/10/2022) for followup or sooner if needed.Schedule b4 you leave. Future Appointments  Date Time Provider Department Center  05/31/2022 10:00 AM Hilty, Lisette AbuKenneth C, MD [facility]   Lab/Order associations: fasting   ICD-10-CM   1. Preventative health care  Z00.00 CBC with Differential/Platelet    Vitamin B12    2. Hyperlipidemia, unspecified hyperlipidemia type  E78.5 CBC with Differential/Platelet    ezetimibe (ZETIA) 10 MG tablet    3. Lichen sclerosus  L90.0     4. High risk medication use  Z79.899 Vitamin B12    5. GAD (generalized anxiety disorder)  F41.1 busPIRone (BUSPAR) 7.5 MG tablet  escitalopram (LEXAPRO) 20 MG tablet    6. Major depressive disorder with single episode, in full remission (HCC)  F32.5 escitalopram (LEXAPRO) 20 MG tablet    7. Statin intolerance  Z78.9 ezetimibe (ZETIA) 10 MG tablet    8. Osteoarthritis, unspecified osteoarthritis type, unspecified site  M19.90     9. Extrinsic asthma, mild persistent, uncomplicated  J45.30     10. Essential hypertension, benign  I10       Meds ordered this encounter  Medications   fluticasone (FLONASE) 50 MCG/ACT nasal spray    Sig: SHAKE LIQUID AND USE 1 SPRAY IN EACH NOSTRIL DAILY    Dispense:  48 g    Refill:  3   albuterol (VENTOLIN HFA) 108 (90 Base) MCG/ACT inhaler    Sig: Inhale 2 puffs into the lungs every 6  (six) hours as needed for wheezing or shortness of breath.    Dispense:  1 each    Refill:  2   BREO ELLIPTA 100-25 MCG/ACT AEPB    Sig: Inhale 1 puff into the lungs daily.    Dispense:  180 each    Refill:  3    Pt needs appt with PCP for further refills.   busPIRone (BUSPAR) 7.5 MG tablet    Sig: Take 1 tablet (7.5 mg total) by mouth 2 (two) times daily.    Dispense:  180 tablet    Refill:  3   escitalopram (LEXAPRO) 20 MG tablet    Sig: Take 1 tablet (20 mg total) by mouth daily.    Dispense:  90 tablet    Refill:  0   esomeprazole (NEXIUM) 40 MG capsule    Sig: Take 1 capsule (40 mg total) by mouth 2 (two) times daily.    Dispense:  180 capsule    Refill:  3   ezetimibe (ZETIA) 10 MG tablet    Sig: Take 1 tablet (10 mg total) by mouth daily.    Dispense:  90 tablet    Refill:  3    Brand name Only   losartan (COZAAR) 50 MG tablet    Sig: TAKE 1 TABLET(50 MG) BY MOUTH DAILY.    Dispense:  90 tablet    Refill:  3   montelukast (SINGULAIR) 10 MG tablet    Sig: TAKE 1 TABLET(10 MG) BY MOUTH AT BEDTIME    Dispense:  90 tablet    Refill:  3    Return precautions advised.  [name], MD

## 2022-02-07 NOTE — Patient Instructions (Addendum)
Flu shot- we should have these available within a month or two but please let us know if you get at outside pharmacy -let us know if you get covid shot after october  - consider calorie counting (myfitnesspal) or weight watchers  Sign release of information at the check out desk for last mammogram from [facility]  Please stop by lab before you go If you have mychart- we will send your results within 3 business days of [place] receiving them.  If you do not have mychart- we will call you about results within 5 business days of [place] receiving them.  *please also note that you will see labs on mychart as soon as they post. I will later go in and write notes on them- will say "notes from Dr. Durene Cal"    Recommended follow up: Return in about 6 months (around 08/10/2022) for followup or sooner if needed.Schedule b4 you leave.

## 2022-03-05 DIAGNOSIS — L821 Other seborrheic keratosis: Secondary | ICD-10-CM | POA: Diagnosis not present

## 2022-03-05 DIAGNOSIS — C44722 Squamous cell carcinoma of skin of right lower limb, including hip: Secondary | ICD-10-CM | POA: Diagnosis not present

## 2022-03-05 DIAGNOSIS — D225 Melanocytic nevi of trunk: Secondary | ICD-10-CM | POA: Diagnosis not present

## 2022-03-05 DIAGNOSIS — L570 Actinic keratosis: Secondary | ICD-10-CM | POA: Diagnosis not present

## 2022-03-05 DIAGNOSIS — L814 Other melanin hyperpigmentation: Secondary | ICD-10-CM | POA: Diagnosis not present

## 2022-03-05 DIAGNOSIS — D485 Neoplasm of uncertain behavior of skin: Secondary | ICD-10-CM | POA: Diagnosis not present

## 2022-03-12 ENCOUNTER — Other Ambulatory Visit: Payer: Self-pay | Admitting: Family Medicine

## 2022-03-12 DIAGNOSIS — F411 Generalized anxiety disorder: Secondary | ICD-10-CM

## 2022-04-05 DIAGNOSIS — C44722 Squamous cell carcinoma of skin of right lower limb, including hip: Secondary | ICD-10-CM | POA: Diagnosis not present

## 2022-04-08 DIAGNOSIS — Z96643 Presence of artificial hip joint, bilateral: Secondary | ICD-10-CM | POA: Diagnosis not present

## 2022-04-08 DIAGNOSIS — M545 Low back pain, unspecified: Secondary | ICD-10-CM | POA: Diagnosis not present

## 2022-04-16 DIAGNOSIS — L570 Actinic keratosis: Secondary | ICD-10-CM | POA: Diagnosis not present

## 2022-05-08 DIAGNOSIS — N9089 Other specified noninflammatory disorders of vulva and perineum: Secondary | ICD-10-CM | POA: Diagnosis not present

## 2022-05-08 DIAGNOSIS — R208 Other disturbances of skin sensation: Secondary | ICD-10-CM | POA: Diagnosis not present

## 2022-05-08 DIAGNOSIS — N770 Ulceration of vulva in diseases classified elsewhere: Secondary | ICD-10-CM | POA: Diagnosis not present

## 2022-05-09 DIAGNOSIS — N770 Ulceration of vulva in diseases classified elsewhere: Secondary | ICD-10-CM | POA: Diagnosis not present

## 2022-05-31 ENCOUNTER — Encounter: Payer: Self-pay | Admitting: Internal Medicine

## 2022-05-31 ENCOUNTER — Telehealth: Payer: Self-pay | Admitting: Internal Medicine

## 2022-05-31 ENCOUNTER — Ambulatory Visit: Payer: BC Managed Care – PPO | Attending: Internal Medicine | Admitting: Internal Medicine

## 2022-05-31 ENCOUNTER — Encounter: Payer: Self-pay | Admitting: Pharmacist

## 2022-05-31 VITALS — BP 138/90 | HR 75 | Ht 62.0 in | Wt 198.0 lb

## 2022-05-31 DIAGNOSIS — E7800 Pure hypercholesterolemia, unspecified: Secondary | ICD-10-CM | POA: Diagnosis not present

## 2022-05-31 DIAGNOSIS — T466X5A Adverse effect of antihyperlipidemic and antiarteriosclerotic drugs, initial encounter: Secondary | ICD-10-CM | POA: Diagnosis not present

## 2022-05-31 DIAGNOSIS — M791 Myalgia, unspecified site: Secondary | ICD-10-CM | POA: Diagnosis not present

## 2022-05-31 DIAGNOSIS — I2510 Atherosclerotic heart disease of native coronary artery without angina pectoris: Secondary | ICD-10-CM | POA: Diagnosis not present

## 2022-05-31 MED ORDER — REPATHA SURECLICK 140 MG/ML ~~LOC~~ SOAJ: 1.0000 | SUBCUTANEOUS | 11 refills | Status: DC

## 2022-05-31 NOTE — Patient Instructions (Signed)
Medication Instructions:  Dr. Rennis Golden recommends Repatha OR Praluent (PCSK9). This is an injectable cholesterol medication self-administered once every 14 days. This medication will likely need prior approval with your insurance company, which we will work on. If the medication is not approved initially, we may need to do an appeal with your insurance.   Administer medication in area of fatty tissue such as abdomen, outer thigh, back of upper arm - and rotate site with each injection Store medication in refrigerator until ready to administer - allow to sit at room temp for 30 mins - 1 hour prior to injection Dispose of medication in a SHARPS container - your pharmacy should be able to direct you on this and proper disposal   If you need a co-pay card for Repatha: Lawsponsor.fr If you need a co-pay card for Praluent: [email or web address]  Patient Assistance:    These foundations have funds at various times.   The PAN Foundation: https://www.panfoundation.org/disease-funds/hypercholesterolemia/ -- can sign up for wait list  The [facility] offers assistance to help pay for medication copays.  They will cover copays for all cholesterol lowering meds, including statins, fibrates, omega-3 fish oils like Vascepa, ezetimibe, Repatha, Praluent, Nexletol, Nexlizet.  The cards are usually good for $2,500 or 12 months, whichever comes first. Go to healthwellfoundation.org Click on "Apply Now" Answer questions as to whom is applying (patient or representative) Your disease fund will be "hypercholesterolemia - Medicare access" They will ask questions about finances and which medications you are taking for cholesterol When you submit, the approval is usually within minutes.  You will need to print the card information from the site You will need to show this information to your pharmacy, they will bill your Medicare Part D plan first -then bill Health  Well --for the copay.   You can also call them at [phone], although the hold times can be quite long.     *If you need a refill on your cardiac medications before your next appointment, please call your pharmacy*   Lab Work: FASTING lab work to check cholesterol in 3-4 months -- complete about 1 week before next appointment   If you have labs (blood work) drawn today and your tests are completely normal, you will receive your results only by: MyChart Message (if you have MyChart) OR A paper copy in the mail If you have any lab test that is abnormal or we need to change your treatment, we will call you to review the results.   Testing/Procedures: NONE   Follow-Up: At [facility], you and your health needs are our priority.  As part of our continuing mission to provide you with exceptional heart care, we have created designated Provider Care Teams.  These Care Teams include your primary Cardiologist (physician) and Advanced Practice Providers (APPs -  Physician Assistants and Nurse Practitioners) who all work together to provide you with the care you need, when you need it.  We recommend signing up for the patient portal called "MyChart".  Sign up information is provided on this After Visit Summary.  MyChart is used to connect with patients for Virtual Visits (Telemedicine).  Patients are able to view lab/test results, encounter notes, upcoming appointments, etc.  Non-urgent messages can be sent to your provider as well.   To learn more about what you can do with MyChart, go to [email or web address].    Your next appointment:    3-4 months with Dr. Rennis Golden

## 2022-05-31 NOTE — Telephone Encounter (Signed)
Patient of Dr. Rennis Golden who needs PA for Repatha or Praluent, depending on insurance preference.

## 2022-05-31 NOTE — Progress Notes (Signed)
LIPID CLINIC CONSULT NOTE  Chief Complaint:  Manage dyslipidemia  Primary Care Physician: [name], MD  Primary Cardiologist:  None  HPI:  Shannon Mckenzie is a 61 y.o. female who is being seen today for the evaluation of dyslipidemia at the request of Gweneth Dimitri, MD. this is a pleasant 61 year old female kindly referred for evaluation management of dyslipidemia.  She reports a longstanding history of high cholesterol.  There was also some heart disease in her family including her mother who had some congestive heart failure.  She unfortunately has been intolerant of statins having tried more than 3 in the past all of which caused muscle aches including low-dose statins.  She has been able to tolerate ezetimibe.  She said she was a previous patient of Dr. [name] a number of years ago who encouraged her to stay on the ezetimibe.  She reports that even with weight loss and dietary changes her cholesterol has not come down significantly.  Her most recent on treatment lipid showed total cholesterol 230, triglycerides 68, HDL 55 and LDL 163.  When she was not on ezetimibe her LDL was well over 190 concerning for familial hyperlipidemia.  Of note, she had a CT pulmonary angiogram performed presumably in the emergency department for shortness of breath in October 2020.  This was negative for PE or dissection but did show some LAD coronary calcification per my review of the films although it was not reported by radiology.  PMHx:  Past Medical History:  Diagnosis Date   Allergy    Anemia    Anxiety    Aspiration pneumonia (HCC) 09/10/2019   Asthma    Depression    DJD (degenerative joint disease) of hip    Former smoker    GERD (gastroesophageal reflux disease)    Hand pain, right    Heart murmur    Hiatal hernia    History of kidney stones    history   Hyperlipidemia    Hypertension    Kidney stone    Osteoarthritis    Pain, hip    right   Pneumonia    hx but no  hospitalization   Reactive airway disease    RLS (restless legs syndrome)    Routine gynecological examination    sees gynecology, [name] NP   SOB (shortness of breath) 9/14   cardiac eval with stress test and echo; Dr. [name]   Statin intolerance    Vision problems    wears contact lenses    Past Surgical History:  Procedure Laterality Date   bilateral hip replacement     nov 21st 2019 right, Jun 09 2018 left    CARPAL TUNNEL RELEASE Left 07/03/2017   Procedure: LEFT CARPAL TUNNEL RELEASE;  Surgeon: Betha Loa, MD;  Location: [facility];  Service: Orthopedics;  Laterality: Left;   COLONOSCOPY  06/2012   Dr. Ramon Dredge; repeat 10 years   HIATAL HERNIA REPAIR N/A 10/01/2019   Procedure: LAPAROSCOPIC REPAIR OF HIATAL HERNIA WITH NISSEN FUNDOPLICATION, UPPER ENDO, REMOVAL OF GASTRIC MASS;  Surgeon: [name], MD;  Location: [facility];  Service: General;  Laterality: N/A;   UPPER GI ENDOSCOPY N/A 07/24/2012   [place] Hernia,normal stomach, normal examined duodenum   WISDOM TOOTH EXTRACTION      FAMHx:  Family History  Problem Relation Age of Onset   Arthritis Mother    Asthma Mother    Diabetes Mother    Cirrhosis Mother  died of liver nonalcoholic cirrhosis   COPD Mother    Heart disease Mother        CHF   Hypertension Mother    Kidney disease Mother    Liver disease Mother    Eating disorder Mother    Obesity Mother    Cancer Father        died of pancreatic cancer at age 89   Alcohol abuse Father    Asthma Sister        doesnt know history well   Drug abuse Sister    Other Sister    Drug abuse Sister        doesnt know history well   Depression Brother    Alcohol abuse Brother    Other Brother        died of MVA   Heart attack Maternal Grandfather 21   Stroke Maternal Aunt        died age 89yo of CVA    SOCHx:   reports that she quit smoking about 30 years ago. Her smoking use included cigarettes. She has a 15.00 pack-year  smoking history. She has never used smokeless tobacco. She reports current alcohol use. She reports that she does not use drugs.  ALLERGIES:  Allergies  Allergen Reactions   Statins     Myalgias with all statins   Amoxicillin Hives and Rash   Penicillins Hives, Other (See Comments) and Rash    ROS: Pertinent items noted in HPI and remainder of comprehensive ROS otherwise negative.  HOME MEDS: Current Outpatient Medications on File Prior to Visit  Medication Sig Dispense Refill   albuterol (VENTOLIN HFA) 108 (90 Base) MCG/ACT inhaler Inhale 2 puffs into the lungs every 6 (six) hours as needed for wheezing or shortness of breath. 1 each 2   ascorbic acid (VITAMIN C) 500 MG tablet Take 500 mg by mouth daily.     BREO ELLIPTA 100-25 MCG/ACT AEPB Inhale 1 puff into the lungs daily. 180 each 3   busPIRone (BUSPAR) 7.5 MG tablet Take 1 tablet (7.5 mg total) by mouth 2 (two) times daily. 180 tablet 3   cholecalciferol (VITAMIN D3) 25 MCG (1000 UT) tablet Take 1,000 Units by mouth daily.     clobetasol cream (TEMOVATE) 0.05 % Apply 1 Application topically 2 (two) times daily. As needed for lichen sclerosus- from dermatology but would be willing to fill as PCP     Cyanocobalamin (VITAMIN B 12) 500 MCG TABS Take 1,000 mcg by mouth daily at 12 noon.     escitalopram (LEXAPRO) 20 MG tablet Take 1 tablet (20 mg total) by mouth daily. 90 tablet 0   esomeprazole (NEXIUM) 40 MG capsule Take 1 capsule (40 mg total) by mouth 2 (two) times daily. 180 capsule 3   fluticasone (FLONASE) 50 MCG/ACT nasal spray SHAKE LIQUID AND USE 1 SPRAY IN EACH NOSTRIL DAILY 48 g 3   losartan (COZAAR) 50 MG tablet TAKE 1 TABLET(50 MG) BY MOUTH DAILY. 90 tablet 3   montelukast (SINGULAIR) 10 MG tablet TAKE 1 TABLET(10 MG) BY MOUTH AT BEDTIME 90 tablet 3   Multiple Vitamin (MULTIVITAMIN WITH MINERALS) TABS tablet Take 1 tablet by mouth daily.     ezetimibe (ZETIA) 10 MG tablet Take 1 tablet (10 mg total) by mouth daily. 90  tablet 3   No current facility-administered medications on file prior to visit.    LABS/IMAGING: No results found for this or any previous visit (from the past 48 hour(s)). No results  found.  LIPID PANEL:    Component Value Date/Time   CHOL 230 (A) 10/16/2021 0000   CHOL 273 (H) 02/19/2018 1207   TRIG 68 10/16/2021 0000   HDL 55 10/16/2021 0000   HDL 51 02/19/2018 1207   CHOLHDL 4 10/10/2020 1514   VLDL 26.8 10/10/2020 1514   LDLCALC 163 10/16/2021 0000   LDLCALC 197 (H) 02/19/2018 1207   LDLDIRECT 171.0 01/09/2018 0958    WEIGHTS: Wt Readings from Last 3 Encounters:  05/31/22 198 lb (89.8 kg)  02/07/22 189 lb 3.2 oz (85.8 kg)  01/07/22 188 lb 4 oz (85.4 kg)    VITALS: BP (!) 138/90   Pulse 75   Ht 5\' 2"  (1.575 m)   Wt 198 lb (89.8 kg)   LMP 03/13/2015   SpO2 96%   BMI 36.21 kg/m   EXAM: Deferred  EKG: Deferred   ASSESSMENT: Possible familial hyperlipidemia, untreated LDL greater than 190 First-degree relative with heart failure Statin myalgias Coronary artery calcification  PLAN: 1.   Ms. Endicott has a possible familial hyperlipidemia with a very high untreated LDL cholesterol.  She is currently only on ezetimibe and cannot tolerate multiple statins due to myalgias.  She does have some coronary artery calcification seen on imaging as well as family history of heart disease in a first-degree relative.  She needs additional lipid lowering and I think is a good candidate for PCSK9 inhibitor.  Will pursue prior authorization for this.  Plan repeat lipids including an NMR and LP(a) in about 3 to 4 months and follow-up with me afterwards.  He should remain on ezetimibe in addition to this therapy.  Thanks again for the kind referral.   Algis Greenhouse, MD, [facility]  [place]  [facility]  Medical Director of the [facility]id [facility] &  Cardiovascular Risk Reduction Clinic Diplomate of the American Board of Clinical Lipidology Attending  Cardiologist  Direct Dial: [phone]  Fax: [phone]  Website:  [phone] Destinee Taber 05/31/2022, 12:58 PM

## 2022-05-31 NOTE — Telephone Encounter (Addendum)
Repatha prior auth submitted, Key: [id number]. Prior auth approved immediately through 05/30/23.  I have sent rx to pharmacy, activated $5 copay card, and advised pt of the above via MyChart message.

## 2022-06-25 ENCOUNTER — Other Ambulatory Visit: Payer: Self-pay

## 2022-06-25 MED ORDER — REPATHA SURECLICK 140 MG/ML ~~LOC~~ SOAJ: 1.0000 | SUBCUTANEOUS | 11 refills | Status: DC

## 2022-08-09 ENCOUNTER — Encounter: Payer: Self-pay | Admitting: Family Medicine

## 2022-08-09 ENCOUNTER — Ambulatory Visit: Payer: BC Managed Care – PPO | Admitting: Family Medicine

## 2022-08-09 VITALS — BP 142/92 | HR 82 | Temp 97.7°F | Ht 62.0 in | Wt 201.6 lb

## 2022-08-09 DIAGNOSIS — Z789 Other specified health status: Secondary | ICD-10-CM

## 2022-08-09 DIAGNOSIS — E785 Hyperlipidemia, unspecified: Secondary | ICD-10-CM

## 2022-08-09 DIAGNOSIS — F325 Major depressive disorder, single episode, in full remission: Secondary | ICD-10-CM

## 2022-08-09 DIAGNOSIS — F411 Generalized anxiety disorder: Secondary | ICD-10-CM | POA: Diagnosis not present

## 2022-08-09 DIAGNOSIS — I10 Essential (primary) hypertension: Secondary | ICD-10-CM

## 2022-08-09 NOTE — Patient Instructions (Addendum)
Sign release of information at the check out desk for mammogram report from GYN  Team please update rsv and covid shot after discussing with pharmacy   blood pressure high 2 visits in a row- increase losartan to  100 mg (she will take 2 as just got new prescription of 50 mg) and update me in 2 weeks my mychart -On your  MyChart to hit menu, then scroll down to Track my Health, and they will see blood pressure flowsheet (should be self explanatory from there) -or can drop off paper with name and dob   -will refill diclofenac ONCE blood pressure is better but she needs to continue to monitor blood pressure  when starts diclofenac- can try topical diclofenac/voltaren for hands and feet or tylenol arthritis in the meantime   controlled but could try nexium 40 mg every morning then take a dose before dinner every other day  - if not effective can go back up  Recommended follow up: Return for next already scheduled visit or sooner if needed. Unless we need to check in sooner with blood pressure

## 2022-08-09 NOTE — Progress Notes (Signed)
Phone [phone] In person visit   Subjective:   Shannon Mckenzie is a 62 y.o. year old very pleasant female patient who presents for/with See problem oriented charting Chief Complaint  Patient presents with   Follow-up    ( NO MASK).   Hyperlipidemia   Hypertension   Depression   Foot Pain    Would like an Rx for diclofenac.    Past Medical History-  Patient Active Problem List   Diagnosis Date Noted   Major depression in full remission ([place]) 05/08/2017    Priority: High   Lichen sclerosus [id number]    Priority: Medium    Statin intolerance 01/07/2022    Priority: Medium    Status post Nissen fundoplication [id number]    Priority: Medium    Murmur 07/16/2018    Priority: Medium    Hiatal hernia with GERD without esophagitis 01/10/2018    Priority: Medium    Carpal tunnel syndrome 05/26/2017    Priority: Medium    Former smoker 05/26/2017    Priority: Medium    GAD (generalized anxiety disorder) 05/08/2017    Priority: Medium    Osteoarthritis 03/13/2015    Priority: Medium    Anemia 04/16/2013    Priority: Medium    Extrinsic asthma, mild persistent, uncomplicated [id number]    Priority: Medium    Upper airway cough syndrome ? all related to sinus dz 03/19/2013    Priority: Medium    Essential hypertension, benign 04/15/2012    Priority: Medium    Hyperlipidemia 04/15/2012    Priority: Medium    Rectal pain 10/10/2020    Priority: Low   Urinary incontinence 11/30/2019    Priority: Low   Allergic rhinitis 05/26/2017    Priority: Low   Iron deficiency 02/20/2016    Priority: Low   Cough 03/13/2015    Priority: Low   Back pain 11/10/2014    Priority: Low   Rash and nonspecific skin eruption 07/21/2014    Priority: Low   Cold feeling 05/03/2014    Priority: Low   Sinusitis, chronic 06/14/2013    Priority: Low   Obesity 04/15/2012    Priority: Low    Medications- reviewed and updated Current Outpatient Medications  Medication Sig Dispense  Refill   albuterol (VENTOLIN HFA) 108 (90 Base) MCG/ACT inhaler Inhale 2 puffs into the lungs every 6 (six) hours as needed for wheezing or shortness of breath. 1 each 2   ascorbic acid (VITAMIN C) 500 MG tablet Take 500 mg by mouth daily.     BREO ELLIPTA 100-25 MCG/ACT AEPB Inhale 1 puff into the lungs daily. 180 each 3   busPIRone (BUSPAR) 7.5 MG tablet Take 1 tablet (7.5 mg total) by mouth 2 (two) times daily. 180 tablet 3   cholecalciferol (VITAMIN D3) 25 MCG (1000 UT) tablet Take 1,000 Units by mouth daily.     clobetasol cream (TEMOVATE) [id number] % Apply 1 Application topically 2 (two) times daily. As needed for lichen sclerosus- from dermatology but would be willing to fill as PCP     Cyanocobalamin (VITAMIN B 12) 500 MCG TABS Take 1,000 mcg by mouth daily at 12 noon.     escitalopram (LEXAPRO) 20 MG tablet Take 1 tablet (20 mg total) by mouth daily. 90 tablet 0   esomeprazole (NEXIUM) 40 MG capsule Take 1 capsule (40 mg total) by mouth 2 (two) times daily. 180 capsule 3   Evolocumab (REPATHA SURECLICK) [id number] MG/ML SOAJ Inject 140 mg into the skin every 14 (fourteen)  days. 2 mL 11   ezetimibe (ZETIA) 10 MG tablet Take 1 tablet (10 mg total) by mouth daily. 90 tablet 3   fluticasone (FLONASE) 50 MCG/ACT nasal spray SHAKE LIQUID AND USE 1 SPRAY IN EACH NOSTRIL DAILY 48 g 3   losartan (COZAAR) 50 MG tablet TAKE 1 TABLET(50 MG) BY MOUTH DAILY. 90 tablet 3   montelukast (SINGULAIR) 10 MG tablet TAKE 1 TABLET(10 MG) BY MOUTH AT BEDTIME 90 tablet 3   Multiple Vitamin (MULTIVITAMIN WITH MINERALS) TABS tablet Take 1 tablet by mouth daily.     No current facility-administered medications for this visit.     Objective:  BP (!) 142/92   Pulse 82   Temp 97.7 F (36.5 C)   Ht 5' 2"$  (1.575 m)   Wt 201 lb 9.6 oz (91.4 kg)   LMP 03/13/2015   SpO2 99%   BMI 36.87 kg/m  Gen: NAD, resting comfortably CV: RRR no murmurs (noted today) rubs or gallops Lungs: CTAB no crackles, wheeze, rhonchi Ext: no  edema Skin: warm, dry     Assessment and Plan    #Hypertension S: medication:  losartan 50 mg -up 12 lbs since last visit, has been using diclofenac BP Readings from Last 3 Encounters:  08/09/22 (!) 142/92  05/31/22 (!) 138/90  02/07/22 112/70  A/P: blood pressure high 2 visits in a row- increase losartan to  100 mg (she will take 2 as just got new prescription of 50 mg) and update me in 2 weeks my mychart    #Hyperlipidemia S: Patient compliant with Zetia and repatha. - intolerant to even once a week rosuvastatin and multiple other statins. LDL goal closer to 100 but has myalgias Lab Results  Component Value Date   CHOL 230 (A) 10/16/2021   HDL 55 10/16/2021   LDLCALC 163 10/16/2021   LDLDIRECT 171.0 01/09/2018   TRIG 68 10/16/2021   CHOLHDL 4 10/10/2020  A/P: lipids have bene high but now on repatha and updated labs planned through Dr. Debara Pickett - hold off on repeat here- continue current medications      #Asthma S: Patient remains on Singulair and Breo 100-25 mcg 1 puff daily .  Infrequent albuterol use- almost not at all A/P: stable- continue current medicines      #Osteoarthritis- s/p bilateral hip replacements- those are doing much better.  S: she is still having pain in multiple areas though- low back, bilateral feet with hammer toes bilaterally, bilateral hands.   Has seen physical therapy and rheumatology in the past-rheumatology told her she could be released to me.  -Likes to have diclofenac on hand-aware of risks- raising blood pressure, increasing cardiac risk, kidney risk, bleeding risk.   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 No breakthrough A/P: controlled but could try nexium 40 mg every morning then take a dose before dinner every other day  - if not effective can go back up   #Anemia S: Patient with history of EGD and colonoscopy back in 2016 for iron deficiency anemia.  She has been on iron since 2014 and CBC is largely stable A/P: has done well- continue  to monitor     #Depression/GAD S: Controlled on Lexapro 20 mg.  buspirone 7.5 mg BID for anxiety element - Seeing counselor as needed -some stress as a parent- loves her children  A/P: phq9 of 1 today, GAD7 of 1- reasonable control (team will input- continue current medications     #Lichen sclerosus- does have gyn areas but also other areas- November 2022- has been under control- desitin helps if uses early. Encouraged her to cut sugar sweetened beverages that she had before  #HM- team will add RSV and covid shot - they are getting these from pharmacy  Recommended follow up: Return for next already scheduled visit or sooner if needed. Future Appointments  Date Time Provider [place]  09/09/2022 10:00 AM Hilty, Nadean Corwin, MD [facility] None  02/10/2023  9:00 AM Marin Olp, MD [facility]    Lab/Order associations:   ICD-10-CM   1. Essential hypertension, benign  I10 Mychart bp flowsheet    2. Major depressive disorder with single episode, in full remission ([place])  F32.5     3. GAD (generalized anxiety disorder)  F41.1     4. Hyperlipidemia, unspecified hyperlipidemia type  E78.5     5. Statin intolerance  Z78.9      Return precautions advised.  Garret Reddish, MD

## 2022-08-29 DIAGNOSIS — H04123 Dry eye syndrome of bilateral lacrimal glands: Secondary | ICD-10-CM | POA: Diagnosis not present

## 2022-08-29 DIAGNOSIS — H5203 Hypermetropia, bilateral: Secondary | ICD-10-CM | POA: Diagnosis not present

## 2022-08-29 DIAGNOSIS — H43812 Vitreous degeneration, left eye: Secondary | ICD-10-CM | POA: Diagnosis not present

## 2022-09-02 DIAGNOSIS — E7800 Pure hypercholesterolemia, unspecified: Secondary | ICD-10-CM | POA: Diagnosis not present

## 2022-09-05 LAB — NMR, LIPOPROFILE
Cholesterol, Total: 142 mg/dL (ref 100–199)
HDL Particle Number: 41.9 umol/L (ref >=30.5)
HDL-C: 62 mg/dL (ref >39)
LDL Particle Number: 788 nmol/L (ref <1000)
LDL Size: 20.1 nm — ABNORMAL LOW (ref >20.5)
LDL-C (NIH Calc): 64 mg/dL (ref 0–99)
LP-IR Score: 42 (ref <=45)
Small LDL Particle Number: 345 nmol/L (ref <=527)
Triglycerides: 81 mg/dL (ref 0–149)

## 2022-09-05 LAB — LIPOPROTEIN A (LPA): Lipoprotein (a): 21.8 nmol/L (ref <75.0)

## 2022-09-09 ENCOUNTER — Encounter: Payer: Self-pay | Admitting: Internal Medicine

## 2022-09-09 ENCOUNTER — Ambulatory Visit: Payer: BC Managed Care – PPO | Attending: Internal Medicine | Admitting: Internal Medicine

## 2022-09-09 VITALS — BP 130/80 | HR 56 | Ht 62.0 in | Wt 202.8 lb

## 2022-09-09 DIAGNOSIS — T466X5D Adverse effect of antihyperlipidemic and antiarteriosclerotic drugs, subsequent encounter: Secondary | ICD-10-CM | POA: Diagnosis not present

## 2022-09-09 DIAGNOSIS — I2510 Atherosclerotic heart disease of native coronary artery without angina pectoris: Secondary | ICD-10-CM

## 2022-09-09 DIAGNOSIS — E7800 Pure hypercholesterolemia, unspecified: Secondary | ICD-10-CM

## 2022-09-09 DIAGNOSIS — M791 Myalgia, unspecified site: Secondary | ICD-10-CM

## 2022-09-09 NOTE — Progress Notes (Signed)
LIPID CLINIC CONSULT NOTE  Chief Complaint:  Follow-up dyslipidemia  Primary Care Physician: Marin Olp, MD  Primary Cardiologist:  None  HPI:  Shannon Mckenzie is a 62 y.o. female who is being seen today for the evaluation of dyslipidemia at the request of Marin Olp, MD. this is a pleasant 62 year old female kindly referred for evaluation management of dyslipidemia.  She reports a longstanding history of high cholesterol.  There was also some heart disease in her family including her mother who had some congestive heart failure.  She unfortunately has been intolerant of statins having tried more than 3 in the past all of which caused muscle aches including low-dose statins.  She has been able to tolerate ezetimibe.  She said she was a previous patient of Dr. Tollie Eth a number of years ago who encouraged her to stay on the ezetimibe.  She reports that even with weight loss and dietary changes her cholesterol has not come down significantly.  Her most recent on treatment lipid showed total cholesterol 230, triglycerides 68, HDL 55 and LDL 163.  When she was not on ezetimibe her LDL was well over 190 concerning for familial hyperlipidemia.  Of note, she had a CT pulmonary angiogram performed presumably in the emergency department for shortness of breath in October 2020.  This was negative for PE or dissection but did show some LAD coronary calcification per my review of the films although it was not reported by radiology.  09/09/2022  Shannon Mckenzie returns today for follow-up of dyslipidemia.  Overall she says she is feeling well on Repatha.  She has no significant side effects.  Her cholesterols come down substantially.  Her LDL particle number is 788, LDL-C is 64, HDL-C of 62, triglycerides were 81 and small LDL particle #345.  LP(a) was negative at 21.8 nmol/L.  PMHx:  Past Medical History:  Diagnosis Date   Allergy    Anemia    Anxiety    Aspiration pneumonia ([place])  09/10/2019   Asthma    Depression    DJD (degenerative joint disease) of hip    Former smoker    GERD (gastroesophageal reflux disease)    Hand pain, right    Heart murmur    Hiatal hernia    History of kidney stones    history   Hyperlipidemia    Hypertension    Kidney stone    Osteoarthritis    Pain, hip    right   Pneumonia    hx but no hospitalization   Reactive airway disease    RLS (restless legs syndrome)    Routine gynecological examination    sees gynecology, [name] NP   SOB (shortness of breath) 9/14   cardiac eval with stress test and echo; Dr. Tollie Eth   Statin intolerance    Vision problems    wears contact lenses    Past Surgical History:  Procedure Laterality Date   bilateral hip replacement     nov 21st 2019 right, Jun 09 2018 left    CARPAL TUNNEL RELEASE Left 07/03/2017   Procedure: LEFT CARPAL TUNNEL RELEASE;  Surgeon: Leanora Cover, MD;  Location: [facility];  Service: Orthopedics;  Laterality: Left;   COLONOSCOPY  06/2012   Dr. Percell Miller; repeat 10 years   HIATAL HERNIA REPAIR N/A 10/01/2019   Procedure: LAPAROSCOPIC REPAIR OF HIATAL HERNIA WITH NISSEN FUNDOPLICATION, UPPER ENDO, REMOVAL OF GASTRIC MASS;  Surgeon: Johnathan Hausen, MD;  Location: [facility];  Service: General;  Laterality: N/A;   UPPER GI ENDOSCOPY N/A 07/24/2012   [place] Hernia,normal stomach, normal examined duodenum   WISDOM TOOTH EXTRACTION      FAMHx:  Family History  Problem Relation Age of Onset   Arthritis Mother    Asthma Mother    Diabetes Mother    Cirrhosis Mother        died of liver nonalcoholic cirrhosis   COPD Mother    Heart disease Mother        CHF   Hypertension Mother    Kidney disease Mother    Liver disease Mother    Eating disorder Mother    Obesity Mother    Cancer Father        died of pancreatic cancer at age 43   Alcohol abuse Father    Asthma Sister        doesnt know history well   Drug abuse Sister    Other Sister    Drug  abuse Sister        doesnt know history well   Depression Brother    Alcohol abuse Brother    Other Brother        died of MVA   Heart attack Maternal Grandfather 55   Stroke Maternal Aunt        died age 22yo of CVA    SOCHx:   reports that she quit smoking about 31 years ago. Her smoking use included cigarettes. She has a 15.00 pack-year smoking history. She has never used smokeless tobacco. She reports current alcohol use. She reports that she does not use drugs.  ALLERGIES:  Allergies  Allergen Reactions   Statins     Myalgias with all statins   Amoxicillin Hives and Rash   Penicillins Hives, Other (See Comments) and Rash    ROS: Pertinent items noted in HPI and remainder of comprehensive ROS otherwise negative.  HOME MEDS: Current Outpatient Medications on File Prior to Visit  Medication Sig Dispense Refill   albuterol (VENTOLIN HFA) 108 (90 Base) MCG/ACT inhaler Inhale 2 puffs into the lungs every 6 (six) hours as needed for wheezing or shortness of breath. 1 each 2   ascorbic acid (VITAMIN C) 500 MG tablet Take 500 mg by mouth daily.     BREO ELLIPTA 100-25 MCG/ACT AEPB Inhale 1 puff into the lungs daily. 180 each 3   busPIRone (BUSPAR) 7.5 MG tablet Take 1 tablet (7.5 mg total) by mouth 2 (two) times daily. 180 tablet 3   cholecalciferol (VITAMIN D3) 25 MCG (1000 UT) tablet Take 1,000 Units by mouth daily.     clobetasol cream (TEMOVATE) [id number] % Apply 1 Application topically 2 (two) times daily. As needed for lichen sclerosus- from dermatology but would be willing to fill as PCP     Cyanocobalamin (VITAMIN B 12) 500 MCG TABS Take 1,000 mcg by mouth daily at 12 noon.     escitalopram (LEXAPRO) 20 MG tablet Take 1 tablet (20 mg total) by mouth daily. 90 tablet 0   esomeprazole (NEXIUM) 40 MG capsule Take 1 capsule (40 mg total) by mouth 2 (two) times daily. 180 capsule 3   Evolocumab (REPATHA SURECLICK) [id number] MG/ML SOAJ Inject 140 mg into the skin every 14 (fourteen) days. 2  mL 11   ezetimibe (ZETIA) 10 MG tablet Take 1 tablet (10 mg total) by mouth daily. 90 tablet 3   fluticasone (FLONASE) 50 MCG/ACT nasal spray SHAKE LIQUID AND USE 1 SPRAY IN EACH NOSTRIL  DAILY 48 g 3   losartan (COZAAR) 50 MG tablet TAKE 1 TABLET(50 MG) BY MOUTH DAILY. (Patient taking differently: TAKE 2 TABLET(50 MG) BY MOUTH DAILY.) 90 tablet 3   montelukast (SINGULAIR) 10 MG tablet TAKE 1 TABLET(10 MG) BY MOUTH AT BEDTIME 90 tablet 3   Multiple Vitamin (MULTIVITAMIN WITH MINERALS) TABS tablet Take 1 tablet by mouth daily.     No current facility-administered medications on file prior to visit.    LABS/IMAGING: No results found for this or any previous visit (from the past 48 hour(s)). No results found.  LIPID PANEL:    Component Value Date/Time   CHOL 230 (A) 10/16/2021 0000   CHOL 273 (H) 02/19/2018 1207   TRIG 68 10/16/2021 0000   HDL 55 10/16/2021 0000   HDL 51 02/19/2018 1207   CHOLHDL 4 10/10/2020 1514   VLDL 26.8 10/10/2020 1514   LDLCALC 163 10/16/2021 0000   LDLCALC 197 (H) 02/19/2018 1207   LDLDIRECT 171.0 01/09/2018 0958    WEIGHTS: Wt Readings from Last 3 Encounters:  09/09/22 202 lb 12.8 oz (92 kg)  08/09/22 201 lb 9.6 oz (91.4 kg)  05/31/22 198 lb (89.8 kg)    VITALS: BP 130/80   Pulse (!) 56   Ht 5\' 2"  (1.575 m)   Wt 202 lb 12.8 oz (92 kg)   LMP 03/13/2015   SpO2 99%   BMI 37.09 kg/m   EXAM: Deferred  EKG: Deferred   ASSESSMENT: Possible familial hyperlipidemia, untreated LDL greater than 190 First-degree relative with heart failure Statin myalgias Coronary artery calcification Negative LP(a)  PLAN: 1.   Shannon Mckenzie has had a marked reduction in her lipids on Repatha in combination with ezetimibe.  Already well without any significant side effects.  She is at target LDL less than 70 and had a negative LP(a).  Will plan to continue current therapies.  Follow-up with me annually or sooner as necessary.   Pixie Casino, MD, [facility], [place] Director of the Advanced Lipid Disorders &  Cardiovascular Risk Reduction Clinic Diplomate of the American Board of Clinical Lipidology Attending Cardiologist  Direct Dial: [phone]  Fax: [phone]  Website:  [email or web address] Fabiano 09/09/2022, 10:35 AM

## 2022-09-09 NOTE — Patient Instructions (Signed)
Medication Instructions:  NO CHANGES  *If you need a refill on your cardiac medications before your next appointment, please call your pharmacy*   Lab Work: FASTING lab work to check cholesterol in 1 year  If you have labs (blood work) drawn today and your tests are completely normal, you will receive your results only by: [place] (if you have MyChart) OR A paper copy in the mail If you have any lab test that is abnormal or we need to change your treatment, we will call you to review the results.   Testing/Procedures: NONE   Follow-Up: At [facility], you and your health needs are our priority.  As part of our continuing mission to provide you with exceptional heart care, we have created designated Provider Care Teams.  These Care Teams include your primary Cardiologist (physician) and Advanced Practice Providers (APPs -  Physician Assistants and Nurse Practitioners) who all work together to provide you with the care you need, when you need it.  We recommend signing up for the patient portal called "MyChart".  Sign up information is provided on this After Visit Summary.  MyChart is used to connect with patients for Virtual Visits (Telemedicine).  Patients are able to view lab/test results, encounter notes, upcoming appointments, etc.  Non-urgent messages can be sent to your provider as well.   To learn more about what you can do with MyChart, go to [email or web address].    Your next appointment:    12 months with Dr. Debara Pickett

## 2022-10-01 DIAGNOSIS — Z1231 Encounter for screening mammogram for malignant neoplasm of breast: Secondary | ICD-10-CM | POA: Diagnosis not present

## 2022-10-01 DIAGNOSIS — Z130 Encounter for screening for diseases of the blood and blood-forming organs and certain disorders involving the immune mechanism: Secondary | ICD-10-CM | POA: Diagnosis not present

## 2022-10-01 DIAGNOSIS — Z01419 Encounter for gynecological examination (general) (routine) without abnormal findings: Secondary | ICD-10-CM | POA: Diagnosis not present

## 2022-10-01 DIAGNOSIS — I10 Essential (primary) hypertension: Secondary | ICD-10-CM | POA: Diagnosis not present

## 2022-10-01 DIAGNOSIS — Z780 Asymptomatic menopausal state: Secondary | ICD-10-CM | POA: Diagnosis not present

## 2022-10-01 DIAGNOSIS — N904 Leukoplakia of vulva: Secondary | ICD-10-CM | POA: Diagnosis not present

## 2022-10-17 ENCOUNTER — Other Ambulatory Visit: Payer: Self-pay

## 2022-10-17 DIAGNOSIS — F325 Major depressive disorder, single episode, in full remission: Secondary | ICD-10-CM

## 2022-10-17 DIAGNOSIS — F411 Generalized anxiety disorder: Secondary | ICD-10-CM

## 2022-10-17 MED ORDER — ESCITALOPRAM OXALATE 20 MG PO TABS: 20.0000 mg | ORAL_TABLET | Freq: Every day | ORAL | 3 refills | Status: DC

## 2022-11-07 DIAGNOSIS — D225 Melanocytic nevi of trunk: Secondary | ICD-10-CM | POA: Diagnosis not present

## 2022-11-07 DIAGNOSIS — L738 Other specified follicular disorders: Secondary | ICD-10-CM | POA: Diagnosis not present

## 2022-11-07 DIAGNOSIS — L218 Other seborrheic dermatitis: Secondary | ICD-10-CM | POA: Diagnosis not present

## 2022-11-07 DIAGNOSIS — L814 Other melanin hyperpigmentation: Secondary | ICD-10-CM | POA: Diagnosis not present

## 2022-12-06 ENCOUNTER — Telehealth: Payer: Self-pay | Admitting: Family Medicine

## 2022-12-06 MED ORDER — LOSARTAN POTASSIUM 50 MG PO TABS: ORAL_TABLET | ORAL | 3 refills | Status: DC

## 2022-12-06 NOTE — Telephone Encounter (Signed)
Rx sent to requested pharmacy below. 

## 2022-12-06 NOTE — Telephone Encounter (Signed)
Prescription Request  12/06/2022  LOV: 08/09/2022  What is the name of the medication or equipment? losartan (COZAAR) 50 MG tablet (with increased dosage to 100 MG per Patient and AVS from 08/09/22)  Have you contacted your pharmacy to request a refill? Yes   Which pharmacy would you like this sent to?  [facility] #[id number] [name], [place] - [address] AT [facility] OF [facility] [address] & [facility] [address] [place] [address] Phone: [phone] Fax: [phone]    Patient notified that their request is being sent to the clinical staff for review and that they should receive a response within 2 business days.   Please advise at [facility] [phone]

## 2022-12-09 NOTE — Telephone Encounter (Signed)
Patient states Pharmacy has not received RX with increased dosage to 100 MG of Losartan   Patient was taking 2 50 MG tablets per day but requests RX be updated to 100 MG 1 x per day

## 2022-12-10 MED ORDER — LOSARTAN POTASSIUM 100 MG PO TABS: 100.0000 mg | ORAL_TABLET | Freq: Every day | ORAL | 3 refills | Status: DC

## 2022-12-10 NOTE — Telephone Encounter (Signed)
Rx for Losartan 100mg  sent to requested pharmacy.

## 2022-12-10 NOTE — Addendum Note (Signed)
Addended by: Gwenette Greet on: 12/10/2022 07:55 AM   Modules accepted: Orders

## 2022-12-11 ENCOUNTER — Ambulatory Visit: Payer: BC Managed Care – PPO | Admitting: Family Medicine

## 2022-12-11 ENCOUNTER — Encounter: Payer: Self-pay | Admitting: Family Medicine

## 2022-12-11 VITALS — BP 126/70 | HR 63 | Temp 97.5°F | Ht 62.0 in | Wt 198.8 lb

## 2022-12-11 DIAGNOSIS — I10 Essential (primary) hypertension: Secondary | ICD-10-CM

## 2022-12-11 DIAGNOSIS — H6692 Otitis media, unspecified, left ear: Secondary | ICD-10-CM

## 2022-12-11 DIAGNOSIS — J4541 Moderate persistent asthma with (acute) exacerbation: Secondary | ICD-10-CM

## 2022-12-11 DIAGNOSIS — R0981 Nasal congestion: Secondary | ICD-10-CM | POA: Diagnosis not present

## 2022-12-11 LAB — POC COVID19 BINAXNOW: SARS Coronavirus 2 Ag: NEGATIVE

## 2022-12-11 MED ORDER — DOXYCYCLINE HYCLATE 100 MG PO TABS: 100.0000 mg | ORAL_TABLET | Freq: Two times a day (BID) | ORAL | 0 refills | Status: AC

## 2022-12-11 MED ORDER — PREDNISONE 20 MG PO TABS: ORAL_TABLET | ORAL | 0 refills | Status: DC

## 2022-12-11 NOTE — Patient Instructions (Addendum)
Let us know if you get any COVID vaccines or Penumonia vaccines at your pharmacy.  Get mammogram scheduled.  Left ear infection/AOM- treat with doxycycline 100 mg twice daily- this would also cover most pneumonias  Since you are having good response to albuterol from breathing perspective and respond well to prednisone we also opted to send that in  If you have new or worsening symptoms I need to know ASAP and I also need to know if you have persistent symptoms- likely would do x-ray of chest as next step if sinuses are clear

## 2022-12-11 NOTE — Progress Notes (Signed)
Phone [phone] In person visit   Subjective:   Shannon Mckenzie is a 62 y.o. year old very pleasant female patient who presents for/with See problem oriented charting Chief Complaint  Patient presents with   Nasal Congestion    Pt c/o congestion in nasal and chest that has been going on x2 weeks. Has had Zpak which helps a little   Cough    Dry cough    Past Medical History-  Patient Active Problem List   Diagnosis Date Noted   Major depression in full remission (HCC) 05/08/2017    Priority: High   Lichen sclerosus 02/07/2022    Priority: Medium    Statin intolerance 01/07/2022    Priority: Medium    Status post Nissen fundoplication 10/01/2019    Priority: Medium    Murmur 07/16/2018    Priority: Medium    Hiatal hernia with GERD without esophagitis 01/10/2018    Priority: Medium    Carpal tunnel syndrome 05/26/2017    Priority: Medium    Former smoker 05/26/2017    Priority: Medium    GAD (generalized anxiety disorder) 05/08/2017    Priority: Medium    Osteoarthritis 03/13/2015    Priority: Medium    Anemia 04/16/2013    Priority: Medium    Extrinsic asthma, mild persistent, uncomplicated 03/20/2013    Priority: Medium    Upper airway cough syndrome ? all related to sinus dz 03/19/2013    Priority: Medium    Essential hypertension, benign 04/15/2012    Priority: Medium    Hyperlipidemia 04/15/2012    Priority: Medium    Rectal pain 10/10/2020    Priority: Low   Urinary incontinence 11/30/2019    Priority: Low   Allergic rhinitis 05/26/2017    Priority: Low   Iron deficiency 02/20/2016    Priority: Low   Cough 03/13/2015    Priority: Low   Back pain 11/10/2014    Priority: Low   Rash and nonspecific skin eruption 07/21/2014    Priority: Low   Cold feeling 05/03/2014    Priority: Low   Sinusitis, chronic 06/14/2013    Priority: Low   Obesity 04/15/2012    Priority: Low    Medications- reviewed and updated Current Outpatient Medications   Medication Sig Dispense Refill   albuterol (VENTOLIN HFA) 108 (90 Base) MCG/ACT inhaler Inhale 2 puffs into the lungs every 6 (six) hours as needed for wheezing or shortness of breath. 1 each 2   ascorbic acid (VITAMIN C) 500 MG tablet Take 500 mg by mouth daily.     BREO ELLIPTA 100-25 MCG/ACT AEPB Inhale 1 puff into the lungs daily. 180 each 3   busPIRone (BUSPAR) 7.5 MG tablet Take 1 tablet (7.5 mg total) by mouth 2 (two) times daily. 180 tablet 3   cholecalciferol (VITAMIN D3) 25 MCG (1000 UT) tablet Take 1,000 Units by mouth daily.     clobetasol cream (TEMOVATE) 0.05 % Apply 1 Application topically 2 (two) times daily. As needed for lichen sclerosus- from dermatology but would be willing to fill as PCP     Cyanocobalamin (VITAMIN B 12) 500 MCG TABS Take 1,000 mcg by mouth daily at 12 noon.     doxycycline (VIBRA-TABS) 100 MG tablet Take 1 tablet (100 mg total) by mouth 2 (two) times daily for 10 days. 20 tablet 0   escitalopram (LEXAPRO) 20 MG tablet Take 1 tablet (20 mg total) by mouth daily. 90 tablet 3   esomeprazole (NEXIUM) 40 MG capsule Take 1  capsule (40 mg total) by mouth 2 (two) times daily. 180 capsule 3   Evolocumab (REPATHA SURECLICK) 140 MG/ML SOAJ Inject 140 mg into the skin every 14 (fourteen) days. 2 mL 11   ezetimibe (ZETIA) 10 MG tablet Take 1 tablet (10 mg total) by mouth daily. 90 tablet 3   fluticasone (FLONASE) 50 MCG/ACT nasal spray SHAKE LIQUID AND USE 1 SPRAY IN EACH NOSTRIL DAILY 48 g 3   losartan (COZAAR) 100 MG tablet Take 1 tablet (100 mg total) by mouth daily. 90 tablet 3   montelukast (SINGULAIR) 10 MG tablet TAKE 1 TABLET(10 MG) BY MOUTH AT BEDTIME 90 tablet 3   Multiple Vitamin (MULTIVITAMIN WITH MINERALS) TABS tablet Take 1 tablet by mouth daily.     predniSONE (DELTASONE) 20 MG tablet Take 2 pills for 3 days, 1 pill for 4 days 10 tablet 0   No current facility-administered medications for this visit.     Objective:  BP 126/70   Pulse 63   Temp (!)  97.5 F (36.4 C)   Ht 5\' 2"  (1.575 m)   Wt 198 lb 12.8 oz (90.2 kg)   LMP 03/13/2015   SpO2 98%   BMI 36.36 kg/m  Gen: NAD, resting comfortably Right tympanic membrane normal, left tympanic membrane erythematous and bulging, right maxillary sinus tenderness noted, mild erythema in pharynx, edema in nasal turbinates noted CV: RRR no murmurs rubs or gallops Lungs: CTAB no crackles, wheeze, rhonchi Ext: no edema Skin: warm, dry  Results for orders placed or performed in visit on 12/11/22 (from the past 24 hour(s))  POC COVID-19     Status: None   Collection Time: 12/11/22 10:16 AM  Result Value Ref Range   SARS Coronavirus 2 Ag Negative Negative      Assessment and Plan   # Nasal congestion and cough and shortness of breath in patient with known asthma S:2 weeks of symptoms. COVID negative today. Notes congestion in both chest and in nasal passages (mild improvement in this after azithromycin- chest still feels congested. Was treated with azithromycin (finished 2 days ago) after visit with NP through  husbands work. Not wheezing much but feels upper chest congestion/tightness. More shortness of breath than usual- albuterol inhaler still helps up to twice a day. Feels worse if goes out in heat. Was getting thick green sputum but that has slowed thankfully. Does still have sinus pressure particularly on the right side -about 50% improvement with azithromycin but usually works much better than that for her -known asthma- remains on Singulair and Breo 100-25 mcg 1 puff daily  -no fever. Some chills- last 2 days ago. Granddaughter with strep but she has no sore throat- did note perhaps mild white on tonsils a couple days ago A/P: from avs " Left ear infection/AOM- treat with doxycycline 100 mg twice daily- this would also cover most pneumonias  Since you are having good response to albuterol from breathing perspective and respond well to prednisone we also opted to send that in " - we  discussed doxycycline would also likely cover pneumonia. Would prefer Augmentin for her ear but amoxicillin alelrgy with hives -mild flare of asthma with shortness of breath I believe (doubt cardiac as goes mainly with illness and improving as illness improves) - trial prednisone  -also has some pain over left trochanteric bursa- we are hoping this prednisone will help- if not encouraged follow up with mur[facility] Dr. Dion Saucier  #Hypertension S: medication:  losartan 100 mg BP Readings from Last  3 Encounters:  12/11/22 126/70  09/09/22 130/80  08/09/22 (!) 142/92  A/P:  stable- continue current medicines      Recommended follow up: Return for as needed for new, worsening, persistent symptoms. Future Appointments  Date Time Provider Department Center  02/10/2023  9:00 AM [name], MD [facility]   Lab/Order associations:   ICD-10-CM   1. Acute otitis media, left  H66.92     2. Congestion of nasal sinus  R09.81 POC COVID-19    3. Moderate persistent asthma with acute exacerbation  J45.41     4. Essential hypertension, benign  I10       Meds ordered this encounter  Medications   doxycycline (VIBRA-TABS) 100 MG tablet    Sig: Take 1 tablet (100 mg total) by mouth 2 (two) times daily for 10 days.    Dispense:  20 tablet    Refill:  0   predniSONE (DELTASONE) 20 MG tablet    Sig: Take 2 pills for 3 days, 1 pill for 4 days    Dispense:  10 tablet    Refill:  0    Return precautions advised.  [name], MD

## 2023-01-06 ENCOUNTER — Telehealth: Payer: Self-pay | Admitting: Family Medicine

## 2023-01-06 NOTE — Telephone Encounter (Signed)
Prescription Request  01/06/2023  LOV: 12/11/2022  What is the name of the medication or equipment? Diclofenac 75 mg  States PCP and her discussed this during 6/19 OV; States pcp instructed to use prn --she is going out of town for a week and will be doing a lot of walking   Have you contacted your pharmacy to request a refill? Yes   Which pharmacy would you like this sent to?  [facility] #[id number] [name], [place] - [address] AT [place] OF [facility] [address] & [facility] [address] [place] [address] Phone: [phone] Fax: [phone]    Patient notified that their request is being sent to the clinical staff for review and that they should receive a response within 2 business days.   Please advise at Mobile [phone] (mobile)

## 2023-01-06 NOTE — Telephone Encounter (Signed)
Med not on current med list but on med hx filled by another provider, didn't see anything in the ov note that pt referenced. Ok to fill under your name?

## 2023-01-07 ENCOUNTER — Other Ambulatory Visit: Payer: Self-pay

## 2023-01-07 DIAGNOSIS — M1990 Unspecified osteoarthritis, unspecified site: Secondary | ICD-10-CM

## 2023-01-07 MED ORDER — DICLOFENAC SODIUM 75 MG PO TBEC
75.0000 mg | DELAYED_RELEASE_TABLET | Freq: Two times a day (BID) | ORAL | 1 refills | Status: DC
Start: 2023-01-07 — End: 2023-04-10

## 2023-01-07 NOTE — Telephone Encounter (Signed)
Rx sent to requested pharmacy

## 2023-01-22 ENCOUNTER — Encounter (INDEPENDENT_AMBULATORY_CARE_PROVIDER_SITE_OTHER): Payer: Self-pay

## 2023-02-10 ENCOUNTER — Ambulatory Visit (INDEPENDENT_AMBULATORY_CARE_PROVIDER_SITE_OTHER): Payer: BC Managed Care – PPO | Admitting: Family Medicine

## 2023-02-10 ENCOUNTER — Encounter: Payer: Self-pay | Admitting: Family Medicine

## 2023-02-10 VITALS — BP 124/80 | HR 74 | Temp 98.3°F | Ht 62.0 in | Wt 199.2 lb

## 2023-02-10 DIAGNOSIS — Z131 Encounter for screening for diabetes mellitus: Secondary | ICD-10-CM

## 2023-02-10 DIAGNOSIS — E669 Obesity, unspecified: Secondary | ICD-10-CM

## 2023-02-10 DIAGNOSIS — G720 Drug-induced myopathy: Secondary | ICD-10-CM

## 2023-02-10 DIAGNOSIS — Z0000 Encounter for general adult medical examination without abnormal findings: Secondary | ICD-10-CM

## 2023-02-10 DIAGNOSIS — Z87891 Personal history of nicotine dependence: Secondary | ICD-10-CM

## 2023-02-10 DIAGNOSIS — I10 Essential (primary) hypertension: Secondary | ICD-10-CM | POA: Diagnosis not present

## 2023-02-10 DIAGNOSIS — E785 Hyperlipidemia, unspecified: Secondary | ICD-10-CM

## 2023-02-10 DIAGNOSIS — F325 Major depressive disorder, single episode, in full remission: Secondary | ICD-10-CM | POA: Diagnosis not present

## 2023-02-10 DIAGNOSIS — Z780 Asymptomatic menopausal state: Secondary | ICD-10-CM

## 2023-02-10 LAB — COMPREHENSIVE METABOLIC PANEL
ALT: 17 U/L (ref 0–35)
AST: 19 U/L (ref 0–37)
Albumin: 4 g/dL (ref 3.5–5.2)
Alkaline Phosphatase: 92 U/L (ref 39–117)
BUN: 18 mg/dL (ref 6–23)
CO2: 28 mEq/L (ref 19–32)
Calcium: 9 mg/dL (ref 8.4–10.5)
Chloride: 107 mEq/L (ref 96–112)
Creatinine, Ser: 0.91 mg/dL (ref 0.40–1.20)
GFR: 68.5 mL/min (ref >60.00)
Glucose, Bld: 90 mg/dL (ref 70–99)
Potassium: 4.1 mEq/L (ref 3.5–5.1)
Sodium: 144 mEq/L (ref 135–145)
Total Bilirubin: 0.5 mg/dL (ref 0.2–1.2)
Total Protein: 6.6 g/dL (ref 6.0–8.3)

## 2023-02-10 LAB — LIPID PANEL
Cholesterol: 150 mg/dL (ref 0–200)
HDL: 59 mg/dL (ref >39.00)
LDL Cholesterol: 76 mg/dL (ref 0–99)
NonHDL: 90.97
Total CHOL/HDL Ratio: 3
Triglycerides: 76 mg/dL (ref 0.0–149.0)
VLDL: 15.2 mg/dL (ref 0.0–40.0)

## 2023-02-10 LAB — CBC WITH DIFFERENTIAL/PLATELET
Basophils Absolute: 0.1 10*3/uL (ref 0.0–0.1)
Basophils Relative: 0.9 % (ref 0.0–3.0)
Eosinophils Absolute: 0.4 10*3/uL (ref 0.0–0.7)
Eosinophils Relative: 6.3 % — ABNORMAL HIGH (ref 0.0–5.0)
HCT: 39.8 % (ref 36.0–46.0)
Hemoglobin: 13 g/dL (ref 12.0–15.0)
Lymphocytes Relative: 26.8 % (ref 12.0–46.0)
Lymphs Abs: 1.6 10*3/uL (ref 0.7–4.0)
MCHC: 32.5 g/dL (ref 30.0–36.0)
MCV: 87.4 fl (ref 78.0–100.0)
Monocytes Absolute: 0.4 10*3/uL (ref 0.1–1.0)
Monocytes Relative: 7.4 % (ref 3.0–12.0)
Neutro Abs: 3.5 10*3/uL (ref 1.4–7.7)
Neutrophils Relative %: 58.6 % (ref 43.0–77.0)
Platelets: 184 10*3/uL (ref 150.0–400.0)
RBC: 4.56 Mil/uL (ref 3.87–5.11)
RDW: 14.3 % (ref 11.5–15.5)
WBC: 6 10*3/uL (ref 4.0–10.5)

## 2023-02-10 LAB — HEMOGLOBIN A1C: Hgb A1c MFr Bld: 5.3 % (ref 4.6–6.5)

## 2023-02-10 NOTE — Patient Instructions (Addendum)
Let us know if you get a flu or COVID vaccine this fall.  Lets cut that tea back out! And try to improve foods overall  Schedule your bone density test at check out desk.  - located [address] across the street from [place] - in the basement - you DO NEED an appointment for the bone density tests.   Pleaes tell your pharmacy you had 2 shingrix already and do not need anymore- see dates below  Zoster Recombinant(Shingrix) 07/16/2018, 11/09/2018   Please stop by lab before you go If you have mychart- we will send your results within 3 business days of [place] receiving them.  If you do not have mychart- we will call you about results within 5 business days of [place] receiving them.  *please also note that you will see labs on mychart as soon as they post. I will later go in and write notes on them- will say "notes from Dr. Durene Cal"   Recommended follow up: Return in about 6 months (around 08/13/2023) for followup or sooner if needed.Schedule b4 you leave.

## 2023-02-10 NOTE — Progress Notes (Signed)
Phone [phone]   Subjective:  Patient presents today for their annual physical. Chief complaint-noted.   See problem oriented charting- ROS- full  review of systems was completed and negative Per full ROS sheet completed by patient other than arthritis pain  The following were reviewed and entered/updated in epic: Past Medical History:  Diagnosis Date   Allergy    Anemia    Anxiety    Aspiration pneumonia (HCC) 09/10/2019   Asthma    Depression    DJD (degenerative joint disease) of hip    Former smoker    GERD (gastroesophageal reflux disease)    Hand pain, right    Heart murmur    Hiatal hernia    History of kidney stones    history   Hyperlipidemia    Hypertension    Kidney stone    Osteoarthritis    Pain, hip    right   Pneumonia    hx but no hospitalization   Reactive airway disease    RLS (restless legs syndrome)    Routine gynecological examination    sees gynecology, [name] NP   SOB (shortness of breath) 9/14   cardiac eval with stress test and echo; Dr. [name]   Statin intolerance    Vision problems    wears contact lenses   Patient Active Problem List   Diagnosis Date Noted   Major depression in full remission (HCC) 05/08/2017    Priority: High   Lichen sclerosus 02/07/2022    Priority: Medium    Statin intolerance 01/07/2022    Priority: Medium    Status post Nissen fundoplication 10/01/2019    Priority: Medium    Murmur 07/16/2018    Priority: Medium    Hiatal hernia with GERD without esophagitis 01/10/2018    Priority: Medium    Carpal tunnel syndrome 05/26/2017    Priority: Medium    Former smoker 05/26/2017    Priority: Medium    GAD (generalized anxiety disorder) 05/08/2017    Priority: Medium    Osteoarthritis 03/13/2015    Priority: Medium    Anemia 04/16/2013    Priority: Medium    Extrinsic asthma, mild persistent, uncomplicated 03/20/2013    Priority: Medium    Upper airway cough syndrome ? all related to sinus  dz 03/19/2013    Priority: Medium    Essential hypertension, benign 04/15/2012    Priority: Medium    Hyperlipidemia 04/15/2012    Priority: Medium    Rectal pain 10/10/2020    Priority: Low   Urinary incontinence 11/30/2019    Priority: Low   Allergic rhinitis 05/26/2017    Priority: Low   Iron deficiency 02/20/2016    Priority: Low   Cough 03/13/2015    Priority: Low   Back pain 11/10/2014    Priority: Low   Rash and nonspecific skin eruption 07/21/2014    Priority: Low   Cold feeling 05/03/2014    Priority: Low   Sinusitis, chronic 06/14/2013    Priority: Low   Obesity 04/15/2012    Priority: Low   Past Surgical History:  Procedure Laterality Date   bilateral hip replacement     nov 21st 2019 right, Jun 09 2018 left    CARPAL TUNNEL RELEASE Left 07/03/2017   Procedure: LEFT CARPAL TUNNEL RELEASE;  Surgeon: Betha Loa, MD;  Location: [facility];  Service: Orthopedics;  Laterality: Left;   COLONOSCOPY  06/2012   Dr. Ramon Dredge; repeat 10 years   HIATAL HERNIA REPAIR N/A 10/01/2019  Procedure: LAPAROSCOPIC REPAIR OF HIATAL HERNIA WITH NISSEN FUNDOPLICATION, UPPER ENDO, REMOVAL OF GASTRIC MASS;  Surgeon: [name], MD;  Location: [facility];  Service: General;  Laterality: N/A;   UPPER GI ENDOSCOPY N/A 07/24/2012   Hiatus Hernia,normal stomach, normal examined duodenum   WISDOM TOOTH EXTRACTION      Family History  Problem Relation Age of Onset   Arthritis Mother    Asthma Mother    Diabetes Mother    Cirrhosis Mother        died of liver nonalcoholic cirrhosis   COPD Mother    Heart disease Mother        CHF   Hypertension Mother    Kidney disease Mother    Liver disease Mother    Eating disorder Mother    Obesity Mother    Cancer Father        died of pancreatic cancer at age 66   Alcohol abuse Father    Asthma Sister        doesnt know history well   Drug abuse Sister    Other Sister    Drug abuse Sister        doesnt know history well    Depression Brother    Alcohol abuse Brother    Other Brother        died of MVA   Heart attack Maternal Grandfather 90   Stroke Maternal Aunt        died age 43yo of CVA    Medications- reviewed and updated Current Outpatient Medications  Medication Sig Dispense Refill   albuterol (VENTOLIN HFA) 108 (90 Base) MCG/ACT inhaler Inhale 2 puffs into the lungs every 6 (six) hours as needed for wheezing or shortness of breath. 1 each 2   ascorbic acid (VITAMIN C) 500 MG tablet Take 500 mg by mouth daily.     BREO ELLIPTA 100-25 MCG/ACT AEPB Inhale 1 puff into the lungs daily. 180 each 3   busPIRone (BUSPAR) 7.5 MG tablet Take 1 tablet (7.5 mg total) by mouth 2 (two) times daily. 180 tablet 3   cholecalciferol (VITAMIN D3) 25 MCG (1000 UT) tablet Take 1,000 Units by mouth daily.     clobetasol cream (TEMOVATE) 0.05 % Apply 1 Application topically 2 (two) times daily. As needed for lichen sclerosus- from dermatology but would be willing to fill as PCP     Cyanocobalamin (VITAMIN B 12) 500 MCG TABS Take 1,000 mcg by mouth daily at 12 noon.     diclofenac (VOLTAREN) 75 MG EC tablet Take 1 tablet (75 mg total) by mouth 2 (two) times daily. 180 tablet 1   escitalopram (LEXAPRO) 20 MG tablet Take 1 tablet (20 mg total) by mouth daily. 90 tablet 3   esomeprazole (NEXIUM) 40 MG capsule Take 1 capsule (40 mg total) by mouth 2 (two) times daily. 180 capsule 3   Evolocumab (REPATHA SURECLICK) 140 MG/ML SOAJ Inject 140 mg into the skin every 14 (fourteen) days. 2 mL 11   ezetimibe (ZETIA) 10 MG tablet Take 1 tablet (10 mg total) by mouth daily. 90 tablet 3   fluticasone (FLONASE) 50 MCG/ACT nasal spray SHAKE LIQUID AND USE 1 SPRAY IN EACH NOSTRIL DAILY 48 g 3   losartan (COZAAR) 100 MG tablet Take 1 tablet (100 mg total) by mouth daily. 90 tablet 3   montelukast (SINGULAIR) 10 MG tablet TAKE 1 TABLET(10 MG) BY MOUTH AT BEDTIME 90 tablet 3   Multiple Vitamin (MULTIVITAMIN WITH MINERALS) TABS tablet Take  1  tablet by mouth daily.     No current facility-administered medications for this visit.    Allergies-reviewed and updated Allergies  Allergen Reactions   Statins     Myalgias with all statins   Amoxicillin Hives and Rash   Penicillins Hives, Other (See Comments) and Rash    Social History   Social History Narrative   From: [place]   Living: with Husband. 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       Enjoys: outdoors activity, spending time in [place] acres[place] peaceful   Objective  Objective:  BP 124/80 Comment: no improvement on repeat  Pulse 74   Temp 98.3 F (36.8 C)   Ht 5\' 2"  (1.575 m)   Wt 199 lb 3.2 oz (90.4 kg)   LMP 03/13/2015   SpO2 96%   BMI 36.43 kg/m  Gen: NAD, resting comfortably HEENT: Mucous membranes are moist. Oropharynx normal Neck: no thyromegaly CV: RRR no murmurs rubs or gallops Lungs: CTAB no crackles, wheeze, rhonchi Abdomen: soft/nontender/nondistended/normal bowel sounds. No rebound or guarding.  Ext: no edema Skin: warm, dry Neuro: grossly normal, moves all extremities, PERRLA   Assessment and Plan   62 y.o. female presenting for annual physical.  Health Maintenance counseling: 1. Anticipatory guidance: Patient counseled regarding regular dental exams -q6 months, eye exams -yeraly,  avoiding smoking and second hand smoke, limiting alcohol to 1 beverage per day, no illicit drugs .   2. Risk factor reduction:  Advised patient of need for regular exercise and diet rich and fruits and vegetables to reduce risk of heart attack and stroke.  Exercise- active with caregiving roles- minimal exercise outside of that other than some walking and tries to keep moving- and right now does not feel has space for that with her schedule- encouraged when things settle to try  to restart. .  Diet/weight management-weight up 10 pounds in the last year.  Still does not like vegetables.  Had recommended considering my fitness pal or weight watchers last year-she reports a lot of stress in last year- feels could improve foods she is eating and cut tea back out.  Wt Readings from Last 3 Encounters:  02/10/23 199 lb 3.2 oz (90.4 kg)  12/11/22 198 lb 12.8 oz (90.2 kg)  09/09/22 202 lb 12.8 oz (92 kg)  3. Immunizations/screenings/ancillary studies-will do flu shot with work and update [place], opts out of COVID-19 vaccination still Immunization History  Administered Date(s) Administered   Influenza Split 07/06/2011, 03/20/2012   Influenza Whole 04/02/2001, 04/23/2002   Influenza, Seasonal, Injecte, Preservative Fre 04/25/2019   Influenza,inj,Quad PF,6+ Mos 03/19/2013, 02/23/2014   Influenza-Unspecified 03/24/2017, 02/22/2018, 03/24/2021   Janssen (J&J) SARS-COV-2 Vaccination 09/07/2019   Moderna Sars-Covid-2 Vaccination 06/16/2020   Pneumococcal Polysaccharide-23 03/20/2012   Td 02/16/2005   Tdap 07/06/2014   Zoster Recombinant(Shingrix) 07/16/2018, 11/09/2018  4. Cervical cancer screening- 05/17/20 with 5 year repeat  planned as normal and HPV negative- sees GYN regularly Jeani Sow, NP with group with [facility] 5. Breast cancer screening-  breast exam with GYN and mammogram 05/17/20 on file - but has had several since that time-we will request records again this year  6. Colon cancer screening -  07/13/19 with 5 year repeat planned 7. Skin cancer screening- sees derm at least yearly if not  more for lichen sclerosus.  advised regular sunscreen use. Denies worrisome, changing, or new skin lesions.  8. Birth control/STD check- postmenopausal/only active with husband  9. Osteoporosis screening at 87- will order today  10. Smoking associated screening - former smoker- 15 pack years but quit in early 90s- no regular screening required   Status of chronic or acute concerns    #social update- ongoing significant stressors- still care giving for 2 ladies as well as now caring for brother in law after MVC (has asked to stay for 6 months). Multiple deaths in husbands family this year- very challenging -going to [employer]- learning a lot there! Hour away and wants to get baptized   # Still gets labs each November with husband's work but ok with [place] doing labs and they don't do CBC- lipids with cardiology   # Otitis media in June-also possible asthma flare.  Treated with combination of doxycycline and prednisone-patient reports feels prednisone most helpful    #Hypertension S: medication:  losartan 50 mg--> 100 mg in 2024 BP Readings from Last 3 Encounters:  02/10/23 124/80  12/11/22 126/70  09/09/22 130/80  A/P: stable- continue current medicines - reasonable control per jnc8     # Coronary artery calcium  #Hyperlipidemia-working with lipid clinic S: Patient compliant with Zetia and repatha. - intolerant to even once a week rosuvastatin and multiple other statins. LDL goal closer to 100 but has myalgias Lab Results  Component Value Date   CHOL 230 (A) 10/16/2021   HDL 55 10/16/2021   LDLCALC 163 10/16/2021   LDLDIRECT 171.0 01/09/2018   TRIG 68 10/16/2021   CHOLHDL 4 10/10/2020  A/P: will get lipid panel - gets NMR through cardiology. No chest pain or shortness of breath (only rare if really really pushes herself gets mild shortness of breath ).  -continue to monitor    #Asthma S: Patient remains on Singulair and Breo 100-25 mcg 1 puff daily .  Infrequent albuterol use. A/P: doing well without flares recently after prior clearing from last visit- continue current medications      #Osteoarthritis- s/p bilateral hip replacements- doing much beter.  S: Has seen physical therapy and rheumatology in the past-rheumatology told her she could be released to me.  -Likes to have diclofenac on hand-aware of risks- she has unfortunately had to use more  recently  -Voltaren gel not as helpful A/P: osteoarthritis - imperfect control - continue to monitor kidney function with NSAID use.       #GERD S: Compliant with Nexium 40 mg- issues if misses dose A/P: stable- continue current medicines     #Anemia S: Patient with history of EGD and colonoscopy back in 2016 for iron deficiency anemia.  She has been on iron since 2014 and CBC is largely stable A/P: update CBC with labs     #Depression/GAD S: Controlled on Lexapro 20 mg.  buspirone 7.5 mg BID for anxiety element - Seeing counselor in past- not needed right now  A/P: reports good control despite stressors. Leans on her faith and helps her.     #Lichen sclerosus- does have gyn areas but also other areas- November 2022- multiple areas- white ptaches- has seen derm but may ask [place] to rx cream- steroid cream. Diagnosed on biopsy. Cutting out sugar sweetened beverages helps-   Recommended follow up: Return in about 6 months (around 08/13/2023) for followup or sooner if needed.Schedule b4 you leave.  Lab/Order associations: fasting   ICD-10-CM   1. Preventative health  care  Z00.00     2. Essential hypertension, benign  I10 Comprehensive metabolic panel    CBC with Differential/Platelet    3. Hyperlipidemia, unspecified hyperlipidemia type  E78.5 Comprehensive metabolic panel    CBC with Differential/Platelet    Lipid panel    4. Major depressive disorder with single episode, in full remission (HCC)  F32.5     5. Former smoker  Z87.891     6. Drug-induced myopathy  G72.0     7. Screening for diabetes mellitus  Z13.1 HgB A1c    8. Obesity (BMI 30-39.9)  E66.9 HgB A1c    9. Postmenopausal  Z78.0 DG Bone Density      No orders of the defined types were placed in this encounter.   Return precautions advised.  [name], MD

## 2023-02-13 ENCOUNTER — Other Ambulatory Visit: Payer: Self-pay | Admitting: Family Medicine

## 2023-02-15 ENCOUNTER — Other Ambulatory Visit: Payer: Self-pay | Admitting: Family Medicine

## 2023-02-19 DIAGNOSIS — H10413 Chronic giant papillary conjunctivitis, bilateral: Secondary | ICD-10-CM | POA: Diagnosis not present

## 2023-02-20 ENCOUNTER — Inpatient Hospital Stay: Admission: RE | Admit: 2023-02-20 | Payer: BC Managed Care – PPO | Source: Ambulatory Visit

## 2023-02-23 ENCOUNTER — Other Ambulatory Visit: Payer: Self-pay | Admitting: Family Medicine

## 2023-02-25 ENCOUNTER — Ambulatory Visit (INDEPENDENT_AMBULATORY_CARE_PROVIDER_SITE_OTHER)
Admission: RE | Admit: 2023-02-25 | Discharge: 2023-02-25 | Disposition: A | Discharge status: Home / Self Care | Payer: BC Managed Care – PPO | Source: Ambulatory Visit | Attending: Family Medicine | Admitting: Family Medicine

## 2023-02-25 DIAGNOSIS — Z780 Asymptomatic menopausal state: Secondary | ICD-10-CM | POA: Diagnosis not present

## 2023-02-26 ENCOUNTER — Other Ambulatory Visit: Payer: Self-pay

## 2023-02-26 DIAGNOSIS — Z780 Asymptomatic menopausal state: Secondary | ICD-10-CM | POA: Diagnosis not present

## 2023-02-26 MED ORDER — FLUTICASONE PROPIONATE 50 MCG/ACT NA SUSP: NASAL | 3 refills | Status: DC

## 2023-02-27 ENCOUNTER — Other Ambulatory Visit: Payer: Self-pay | Admitting: Family Medicine

## 2023-02-27 DIAGNOSIS — F411 Generalized anxiety disorder: Secondary | ICD-10-CM

## 2023-03-04 ENCOUNTER — Other Ambulatory Visit: Payer: Self-pay

## 2023-03-04 ENCOUNTER — Telehealth: Payer: Self-pay | Admitting: Family Medicine

## 2023-03-04 ENCOUNTER — Other Ambulatory Visit: Payer: Self-pay | Admitting: Family Medicine

## 2023-03-04 MED ORDER — ESOMEPRAZOLE MAGNESIUM 40 MG PO CPDR: 40.0000 mg | DELAYED_RELEASE_CAPSULE | Freq: Two times a day (BID) | ORAL | 3 refills | Status: DC

## 2023-03-04 NOTE — Telephone Encounter (Signed)
Prescription Request  03/04/2023  LOV: 02/10/2023  What is the name of the medication or equipment?  esomeprazole (NEXIUM) 40 MG capsule  Have you contacted your pharmacy to request a refill? Yes   Which pharmacy would you like this sent to?  [id number] [name], [place] - [address] AT [facility] [address] [facility] [address] & [facility] [address] [place] [address] Phone: [phone] Fax: [phone]    Patient notified that their request is being sent to the clinical staff for review and that they should receive a response within 2 business days.   Please advise at Mobile [phone] (mobile)

## 2023-03-10 ENCOUNTER — Telehealth: Payer: Self-pay | Admitting: Family Medicine

## 2023-03-10 MED ORDER — MONTELUKAST SODIUM 10 MG PO TABS: ORAL_TABLET | ORAL | 3 refills | Status: DC

## 2023-03-10 NOTE — Telephone Encounter (Signed)
Refill sent to requested pharmacy.

## 2023-03-10 NOTE — Telephone Encounter (Signed)
Prescription Request  Patient states her last RX's have had Shanda Bumps Cody's name as prescriber instead of Dr. Durene Cal.  03/10/2023  LOV: 02/10/2023  What is the name of the medication or equipment?  montelukast (SINGULAIR) 10 MG tablet  Have you contacted your pharmacy to request a refill? Yes   Which pharmacy would you like this sent to?  [id number] [name], [place] - [address] AT [facility] OF [facility] [address] & [facility] [address] [place] [address] Phone: [phone] Fax: [phone]    Patient notified that their request is being sent to the clinical staff for review and that they should receive a response within 2 business days.   Please advise at Mobile [phone] (mobile)

## 2023-03-19 ENCOUNTER — Other Ambulatory Visit: Payer: Self-pay | Admitting: Family Medicine

## 2023-03-19 DIAGNOSIS — E785 Hyperlipidemia, unspecified: Secondary | ICD-10-CM

## 2023-03-19 DIAGNOSIS — Z789 Other specified health status: Secondary | ICD-10-CM

## 2023-04-10 ENCOUNTER — Other Ambulatory Visit: Payer: Self-pay | Admitting: Family Medicine

## 2023-04-10 DIAGNOSIS — M1990 Unspecified osteoarthritis, unspecified site: Secondary | ICD-10-CM

## 2023-05-02 ENCOUNTER — Ambulatory Visit: Payer: BC Managed Care – PPO | Admitting: Physician Assistant

## 2023-05-02 ENCOUNTER — Encounter: Payer: Self-pay | Admitting: Physician Assistant

## 2023-05-02 VITALS — BP 138/76 | HR 75 | Temp 98.2°F | Ht 62.0 in | Wt 195.5 lb

## 2023-05-02 DIAGNOSIS — J4541 Moderate persistent asthma with (acute) exacerbation: Secondary | ICD-10-CM | POA: Diagnosis not present

## 2023-05-02 MED ORDER — PREDNISONE 20 MG PO TABS: 20.0000 mg | ORAL_TABLET | Freq: Two times a day (BID) | ORAL | 0 refills | Status: DC

## 2023-05-02 MED ORDER — AZITHROMYCIN 250 MG PO TABS: ORAL_TABLET | ORAL | 0 refills | Status: AC

## 2023-05-02 NOTE — Progress Notes (Signed)
Shannon Mckenzie is a 62 y.o. female here for a new problem.  History of Present Illness:   Chief Complaint  Patient presents with   Cough    Pt c/o cough and chest congestion, expectorating green thick sputum, 4 days. Denies fever or chills.   HPI  Cough: Complains of cough with expectoration of thick green sputum and congestion for the past 4 days. Reports that her symptoms have gradually worsened since onset. States she has been taking OTC Allegra and using her Breo inhaler daily - hasn't needed to use Albuterol.  Hx of asthma. Endorses mild SOB that is worse today. Denies chest pain. Requesting Prednisone PO as that has worked before. Denies recent sick contacts, recent travel, or fever/chills.  Past Medical History:  Diagnosis Date   Allergy    Anemia    Anxiety    Aspiration pneumonia (HCC) 09/10/2019   Asthma    Depression    DJD (degenerative joint disease) of hip    Former smoker    GERD (gastroesophageal reflux disease)    Hand pain, right    Heart murmur    Hiatal hernia    History of kidney stones    history   Hyperlipidemia    Hypertension    Kidney stone    Osteoarthritis    Pain, hip    right   Pneumonia    hx but no hospitalization   Reactive airway disease    RLS (restless legs syndrome)    Routine gynecological examination    sees gynecology, [name] NP   SOB (shortness of breath) 9/14   cardiac eval with stress test and echo; Dr. [name]   Statin intolerance    Vision problems    wears contact lenses    Social History   Tobacco Use   Smoking status: Former    Current packs/day: 0.00    Average packs/day: 1 pack/day for 15.0 years (15.0 ttl pk-yrs)    Types: Cigarettes    Start date: 06/24/1976    Quit date: 06/25/1991    Years since quitting: 31.8   Smokeless tobacco: Never  Vaping Use   Vaping status: Never Used  Substance Use Topics   Alcohol use: Yes    Alcohol/week: 0.0 standard drinks of alcohol    Comment: wine every 6  months or so   Drug use: No   Past Surgical History:  Procedure Laterality Date   bilateral hip replacement     nov 21st 2019 right, Jun 09 2018 left    CARPAL TUNNEL RELEASE Left 07/03/2017   Procedure: LEFT CARPAL TUNNEL RELEASE;  Surgeon: Betha Loa, MD;  Location: [facility];  Service: Orthopedics;  Laterality: Left;   COLONOSCOPY  06/2012   Dr. Ramon Dredge; repeat 10 years   HIATAL HERNIA REPAIR N/A 10/01/2019   Procedure: LAPAROSCOPIC REPAIR OF HIATAL HERNIA WITH NISSEN FUNDOPLICATION, UPPER ENDO, REMOVAL OF GASTRIC MASS;  Surgeon: [name], MD;  Location: [facility];  Service: General;  Laterality: N/A;   UPPER GI ENDOSCOPY N/A 07/24/2012   Hiatus Hernia,normal stomach, normal examined duodenum   WISDOM TOOTH EXTRACTION     Family History  Problem Relation Age of Onset   Arthritis Mother    Asthma Mother    Diabetes Mother    Cirrhosis Mother        died of liver nonalcoholic cirrhosis   COPD Mother    Heart disease Mother        CHF   Hypertension  Mother    Kidney disease Mother    Liver disease Mother    Eating disorder Mother    Obesity Mother    Cancer Father        died of pancreatic cancer at age 1   Alcohol abuse Father    Asthma Sister        doesnt know history well   Drug abuse Sister    Other Sister    Drug abuse Sister        doesnt know history well   Depression Brother    Alcohol abuse Brother    Other Brother        died of MVA   Heart attack Maternal Grandfather 41   Stroke Maternal Aunt        died age 29yo of CVA   Allergies  Allergen Reactions   Statins     Myalgias with all statins   Amoxicillin Hives and Rash   Penicillins Hives, Other (See Comments) and Rash   Current Medications:   Current Outpatient Medications:    albuterol (VENTOLIN HFA) 108 (90 Base) MCG/ACT inhaler, Inhale 2 puffs into the lungs every 6 (six) hours as needed for wheezing or shortness of breath., Disp: 1 each, Rfl: 2   ascorbic acid (VITAMIN C)  500 MG tablet, Take 500 mg by mouth daily., Disp: , Rfl:    azithromycin (ZITHROMAX) 250 MG tablet, Take 2 tablets on day 1, then 1 tablet daily on days 2 through 5, Disp: 6 tablet, Rfl: 0   BREO ELLIPTA 100-25 MCG/ACT AEPB, INHALE 1 PUFF INTO THE LUNGS DAILY, Disp: 180 each, Rfl: 3   busPIRone (BUSPAR) 7.5 MG tablet, TAKE 1 TABLET(7.5 MG) BY MOUTH TWICE DAILY, Disp: 180 tablet, Rfl: 3   cholecalciferol (VITAMIN D3) 25 MCG (1000 UT) tablet, Take 1,000 Units by mouth daily., Disp: , Rfl:    clobetasol cream (TEMOVATE) 0.05 %, Apply 1 Application topically 2 (two) times daily. As needed for lichen sclerosus- from dermatology but would be willing to fill as PCP, Disp: , Rfl:    Cyanocobalamin (VITAMIN B 12) 500 MCG TABS, Take 1,000 mcg by mouth daily at 12 noon., Disp: , Rfl:    diclofenac (VOLTAREN) 75 MG EC tablet, TAKE 1 TABLET(75 MG) BY MOUTH TWICE DAILY, Disp: 180 tablet, Rfl: 1   escitalopram (LEXAPRO) 20 MG tablet, Take 1 tablet (20 mg total) by mouth daily., Disp: 90 tablet, Rfl: 3   esomeprazole (NEXIUM) 40 MG capsule, Take 1 capsule (40 mg total) by mouth 2 (two) times daily., Disp: 180 capsule, Rfl: 3   Evolocumab (REPATHA SURECLICK) 140 MG/ML SOAJ, Inject 140 mg into the skin every 14 (fourteen) days., Disp: 2 mL, Rfl: 11   ezetimibe (ZETIA) 10 MG tablet, TAKE 1 TABLET(10 MG) BY MOUTH DAILY, Disp: 90 tablet, Rfl: 3   fluticasone (FLONASE) 50 MCG/ACT nasal spray, SHAKE LIQUID AND USE 1 SPRAY IN EACH NOSTRIL DAILY, Disp: 48 g, Rfl: 3   losartan (COZAAR) 100 MG tablet, Take 1 tablet (100 mg total) by mouth daily., Disp: 90 tablet, Rfl: 3   montelukast (SINGULAIR) 10 MG tablet, TAKE 1 TABLET(10 MG) BY MOUTH AT BEDTIME, Disp: 90 tablet, Rfl: 3   Multiple Vitamin (MULTIVITAMIN WITH MINERALS) TABS tablet, Take 1 tablet by mouth daily., Disp: , Rfl:    predniSONE (DELTASONE) 20 MG tablet, Take 1 tablet (20 mg total) by mouth 2 (two) times daily with a meal., Disp: 10 tablet, Rfl: 0  Review of  Systems:  ROS See pertinent positives and negatives as per the HPI.  Vitals:   Vitals:   05/02/23 1037  BP: 138/76  Pulse: 75  Temp: 98.2 F (36.8 C)  TempSrc: Temporal  SpO2: 97%  Weight: 195 lb 8 oz (88.7 kg)  Height: 5\' 2"  (1.575 m)     Body mass index is 35.76 kg/m.  Physical Exam:   Physical Exam Vitals and nursing note reviewed.  Constitutional:      General: She is not in acute distress.    Appearance: She is well-developed. She is not ill-appearing or toxic-appearing.  HENT:     Head: Normocephalic and atraumatic.     Right Ear: Ear canal and external ear normal. A middle ear effusion is present. Tympanic membrane is not erythematous, retracted or bulging.     Left Ear: Ear canal and external ear normal. A middle ear effusion is present. Tympanic membrane is not erythematous, retracted or bulging.     Nose: Nose normal.     Right Sinus: No maxillary sinus tenderness or frontal sinus tenderness.     Left Sinus: No maxillary sinus tenderness or frontal sinus tenderness.     Mouth/Throat:     Pharynx: Uvula midline. Posterior oropharyngeal erythema present.  Eyes:     General: Lids are normal.     Conjunctiva/sclera: Conjunctivae normal.  Neck:     Trachea: Trachea normal.  Cardiovascular:     Rate and Rhythm: Normal rate and regular rhythm.     Heart sounds: Normal heart sounds, S1 normal and S2 normal.  Pulmonary:     Effort: Pulmonary effort is normal.     Breath sounds: Normal breath sounds. No decreased breath sounds, wheezing, rhonchi or rales.  Lymphadenopathy:     Cervical: No cervical adenopathy.  Skin:    General: Skin is warm and dry.  Neurological:     Mental Status: She is alert.  Psychiatric:        Speech: Speech normal.        Behavior: Behavior normal. Behavior is cooperative.     Assessment and Plan:   Moderate persistent asthma with acute exacerbation No red flags on exam.  Will initiate prednisone and azithromycin as prescribed.   Continue asthma regimen. Discussed taking medications as prescribed. Reviewed return precautions including worsening fever, SOB, worsening cough or other concerns. Push fluids and rest. I recommend that patient follow-up if symptoms worsen or persist despite treatment x 7-10 days, sooner if needed.    I,Emily Lagle,acting as a Neurosurgeon for [employer], PA.,have documented all relevant documentation on the behalf of [name], PA,as directed by  [name], PA while in the presence of [name], [place].  I, [name], [place], have reviewed all documentation for this visit. The documentation on 05/02/23 for the exam, diagnosis, procedures, and orders are all accurate and complete.  [name], PA-C

## 2023-05-06 ENCOUNTER — Other Ambulatory Visit: Payer: Self-pay | Admitting: Physician Assistant

## 2023-05-06 ENCOUNTER — Telehealth: Payer: Self-pay | Admitting: Family Medicine

## 2023-05-06 DIAGNOSIS — J4541 Moderate persistent asthma with (acute) exacerbation: Secondary | ICD-10-CM

## 2023-05-06 NOTE — Telephone Encounter (Signed)
Per last appt, pt is not feeling better and would like the Xray that was suggested. Please advise.

## 2023-05-06 NOTE — Telephone Encounter (Signed)
Spoke to pt asked her if she is having any severe chest pain, fevers or shortness of breath? Pt said no. Told her okay will order chest x-ray to be done at [facility]  in the basement, hours are 8:30 to 5:00 can just walk in and we will be in touch with results. Pt verbalized understanding and said she will go tomorrow morning. Told her that is fine but if you develop any chest pain, fever or SOB need to go to the ER. Pt verbalized understanding.

## 2023-05-06 NOTE — Telephone Encounter (Signed)
Please see message. °

## 2023-05-07 ENCOUNTER — Ambulatory Visit (INDEPENDENT_AMBULATORY_CARE_PROVIDER_SITE_OTHER)
Admission: RE | Admit: 2023-05-07 | Discharge: 2023-05-07 | Disposition: A | Discharge status: Home / Self Care | Payer: BC Managed Care – PPO | Source: Ambulatory Visit | Attending: Physician Assistant | Admitting: Physician Assistant

## 2023-05-07 DIAGNOSIS — R0602 Shortness of breath: Secondary | ICD-10-CM | POA: Diagnosis not present

## 2023-05-07 DIAGNOSIS — J4541 Moderate persistent asthma with (acute) exacerbation: Secondary | ICD-10-CM

## 2023-05-07 DIAGNOSIS — R059 Cough, unspecified: Secondary | ICD-10-CM | POA: Diagnosis not present

## 2023-05-11 ENCOUNTER — Other Ambulatory Visit: Payer: Self-pay | Admitting: Internal Medicine

## 2023-06-04 DIAGNOSIS — L820 Inflamed seborrheic keratosis: Secondary | ICD-10-CM | POA: Diagnosis not present

## 2023-06-23 ENCOUNTER — Telehealth: Payer: Self-pay | Admitting: Internal Medicine

## 2023-06-23 ENCOUNTER — Other Ambulatory Visit (HOSPITAL_COMMUNITY): Payer: Self-pay

## 2023-06-23 ENCOUNTER — Telehealth: Payer: Self-pay | Admitting: Pharmacy Technician

## 2023-06-23 NOTE — Telephone Encounter (Signed)
Pt c/o medication issue:  1. Name of Medication: Evolocumab (REPATHA SURECLICK) 140 MG/ML SOAJ   2. How are you currently taking this medication (dosage and times per day)? As written  3. Are you having a reaction (difficulty breathing--STAT)? No   4. What is your medication issue? Per Pharmacy they are waiting on Prior Auth so they can fill script

## 2023-06-23 NOTE — Telephone Encounter (Signed)
Pharmacy Patient Advocate Encounter   Received notification from Pt Calls Messages that prior authorization for repatha is required/requested.   Insurance verification completed.   The patient is insured through [facility] .   Per test claim: PA required; PA submitted to above mentioned insurance via CoverMyMeds Key/confirmation #/EOC [id number] Status is pending

## 2023-06-24 ENCOUNTER — Other Ambulatory Visit (HOSPITAL_COMMUNITY): Payer: Self-pay

## 2023-06-24 NOTE — Telephone Encounter (Signed)
 Pharmacy Patient Advocate Encounter  Received notification from [facility] that Prior Authorization for repatha  has been APPROVED from 06/24/23 to 06/22/24. Ran test claim, Copay is $105.00- 3 months. This test claim was processed through [facility]- copay amounts may vary at other pharmacies due to pharmacy/plan contracts, or as the patient moves through the different stages of their insurance plan.   PA #/Case ID/Reference #: [id number]

## 2023-06-27 ENCOUNTER — Other Ambulatory Visit (HOSPITAL_COMMUNITY): Payer: Self-pay

## 2023-07-14 ENCOUNTER — Other Ambulatory Visit: Payer: Self-pay | Admitting: *Deleted

## 2023-07-14 DIAGNOSIS — E7800 Pure hypercholesterolemia, unspecified: Secondary | ICD-10-CM

## 2023-07-17 ENCOUNTER — Other Ambulatory Visit: Payer: Self-pay | Admitting: Family Medicine

## 2023-07-17 DIAGNOSIS — F411 Generalized anxiety disorder: Secondary | ICD-10-CM

## 2023-07-17 DIAGNOSIS — F325 Major depressive disorder, single episode, in full remission: Secondary | ICD-10-CM

## 2023-07-21 ENCOUNTER — Ambulatory Visit: Payer: BC Managed Care – PPO | Admitting: Family Medicine

## 2023-07-21 ENCOUNTER — Encounter: Payer: Self-pay | Admitting: Family Medicine

## 2023-07-21 ENCOUNTER — Telehealth: Payer: Self-pay | Admitting: Family Medicine

## 2023-07-21 VITALS — BP 118/80 | HR 75 | Temp 97.9°F | Ht 62.0 in | Wt 196.2 lb

## 2023-07-21 DIAGNOSIS — J329 Chronic sinusitis, unspecified: Secondary | ICD-10-CM | POA: Diagnosis not present

## 2023-07-21 DIAGNOSIS — I10 Essential (primary) hypertension: Secondary | ICD-10-CM

## 2023-07-21 DIAGNOSIS — R059 Cough, unspecified: Secondary | ICD-10-CM

## 2023-07-21 DIAGNOSIS — J4541 Moderate persistent asthma with (acute) exacerbation: Secondary | ICD-10-CM

## 2023-07-21 DIAGNOSIS — B9789 Other viral agents as the cause of diseases classified elsewhere: Secondary | ICD-10-CM

## 2023-07-21 LAB — POC COVID19 BINAXNOW: SARS Coronavirus 2 Ag: NEGATIVE

## 2023-07-21 LAB — POCT INFLUENZA A/B
Influenza A, POC: NEGATIVE
Influenza B, POC: NEGATIVE

## 2023-07-21 MED ORDER — ALBUTEROL SULFATE HFA 108 (90 BASE) MCG/ACT IN AERS: 2.0000 | INHALATION_SPRAY | Freq: Four times a day (QID) | RESPIRATORY_TRACT | 2 refills | Status: AC | PRN

## 2023-07-21 MED ORDER — ALBUTEROL SULFATE (2.5 MG/3ML) 0.083% IN NEBU: 2.5000 mg | INHALATION_SOLUTION | Freq: Four times a day (QID) | RESPIRATORY_TRACT | 1 refills | Status: DC | PRN

## 2023-07-21 MED ORDER — PREDNISONE 20 MG PO TABS: ORAL_TABLET | ORAL | 0 refills | Status: DC

## 2023-07-21 NOTE — Telephone Encounter (Signed)
PATIENT IS NEEDING THE MEDICATION FOR HER NEBULIZER TO BE CALLED IN

## 2023-07-21 NOTE — Progress Notes (Signed)
Phone [phone] In person visit   Subjective:   Shannon Mckenzie is a 63 y.o. year old very pleasant female patient who presents for/with See problem oriented charting Chief Complaint  Patient presents with   Cough    Pt c/o cough with green mucous that started last Thursday and got worse Friday   Headache    Pt c/o headache    Past Medical History-  Patient Active Problem List   Diagnosis Date Noted   Major depression in full remission (HCC) 05/08/2017    Priority: High   Lichen sclerosus 02/07/2022    Priority: Medium    Statin intolerance 01/07/2022    Priority: Medium    Status post Nissen fundoplication 10/01/2019    Priority: Medium    Murmur 07/16/2018    Priority: Medium    Hiatal hernia with GERD without esophagitis 01/10/2018    Priority: Medium    Carpal tunnel syndrome 05/26/2017    Priority: Medium    Former smoker 05/26/2017    Priority: Medium    GAD (generalized anxiety disorder) 05/08/2017    Priority: Medium    Osteoarthritis 03/13/2015    Priority: Medium    Anemia 04/16/2013    Priority: Medium    Extrinsic asthma, mild persistent, uncomplicated 03/20/2013    Priority: Medium    Upper airway cough syndrome ? all related to sinus dz 03/19/2013    Priority: Medium    Essential hypertension, benign 04/15/2012    Priority: Medium    Hyperlipidemia 04/15/2012    Priority: Medium    Rectal pain 10/10/2020    Priority: Low   Urinary incontinence 11/30/2019    Priority: Low   Allergic rhinitis 05/26/2017    Priority: Low   Iron deficiency 02/20/2016    Priority: Low   Cough 03/13/2015    Priority: Low   Back pain 11/10/2014    Priority: Low   Rash and nonspecific skin eruption 07/21/2014    Priority: Low   Cold feeling 05/03/2014    Priority: Low   Sinusitis, chronic 06/14/2013    Priority: Low   Obesity 04/15/2012    Priority: Low    Medications- reviewed and updated Current Outpatient Medications  Medication Sig Dispense  Refill   albuterol (PROVENTIL) (2.5 MG/3ML) 0.083% nebulizer solution Take 3 mLs (2.5 mg total) by nebulization every 6 (six) hours as needed for wheezing or shortness of breath. 150 mL 1   ascorbic acid (VITAMIN C) 500 MG tablet Take 500 mg by mouth daily.     BREO ELLIPTA 100-25 MCG/ACT AEPB INHALE 1 PUFF INTO THE LUNGS DAILY 180 each 3   cholecalciferol (VITAMIN D3) 25 MCG (1000 UT) tablet Take 1,000 Units by mouth daily.     clobetasol cream (TEMOVATE) 0.05 % Apply 1 Application topically 2 (two) times daily. As needed for lichen sclerosus- from dermatology but would be willing to fill as PCP     Cyanocobalamin (VITAMIN B 12) 500 MCG TABS Take 1,000 mcg by mouth daily at 12 noon.     diclofenac (VOLTAREN) 75 MG EC tablet TAKE 1 TABLET(75 MG) BY MOUTH TWICE DAILY 180 tablet 1   escitalopram (LEXAPRO) 20 MG tablet TAKE 1 TABLET(20 MG) BY MOUTH DAILY 90 tablet 3   esomeprazole (NEXIUM) 40 MG capsule Take 1 capsule (40 mg total) by mouth 2 (two) times daily. 180 capsule 3   Evolocumab (REPATHA SURECLICK) 140 MG/ML SOAJ ADMINISTER 1 ML UNDER THE SKIN EVERY 14 DAYS 6 mL 3   ezetimibe (  ZETIA) 10 MG tablet TAKE 1 TABLET(10 MG) BY MOUTH DAILY 90 tablet 3   fluticasone (FLONASE) 50 MCG/ACT nasal spray SHAKE LIQUID AND USE 1 SPRAY IN EACH NOSTRIL DAILY 48 g 3   losartan (COZAAR) 100 MG tablet Take 1 tablet (100 mg total) by mouth daily. 90 tablet 3   montelukast (SINGULAIR) 10 MG tablet TAKE 1 TABLET(10 MG) BY MOUTH AT BEDTIME 90 tablet 3   Multiple Vitamin (MULTIVITAMIN WITH MINERALS) TABS tablet Take 1 tablet by mouth daily.     predniSONE (DELTASONE) 20 MG tablet Take 2 pills for 3 days, 1 pill for 4 days 10 tablet 0   albuterol (VENTOLIN HFA) 108 (90 Base) MCG/ACT inhaler Inhale 2 puffs into the lungs every 6 (six) hours as needed for wheezing or shortness of breath. 1 each 2   No current facility-administered medications for this visit.     Objective:  BP 118/80   Pulse 75   Temp 97.9 F (36.6  C)   Ht 5\' 2"  (1.575 m)   Wt 196 lb 3.2 oz (89 kg)   LMP 03/13/2015   SpO2 96%   BMI 35.89 kg/m  Gen: NAD, resting comfortably Left maxillary sinus tenderness noted, nasal turbinates erythematous and edematous with some yellow discharge, pharynx with mild erythema/signs of drainage CV: RRR no murmurs rubs or gallops Lungs: CTAB no crackles, wheeze, rhonchi Ext: no edema Skin: warm, dry   Results for orders placed or performed in visit on 07/21/23 (from the past 24 hours)  POC COVID-19     Status: None   Collection Time: 07/21/23  4:11 PM  Result Value Ref Range   SARS Coronavirus 2 Ag Negative Negative  POCT Influenza A/B     Status: None   Collection Time: 07/21/23  4:11 PM  Result Value Ref Range   Influenza A, POC Negative Negative   Influenza B, POC Negative Negative       Assessment and Plan   # cough/congestion S:symptoms started Thursday the 23rd but worsened into the weekend and Saturday and Sunday were very bothersome. Coughing up thick green mucus. Has constant tickle in throat. Upper chest congestion. Also notes nasal congestion.ribs hurt form cough. Shortness of breath only with coughing fits. Posterior headache. Some lower abdominal pain last night- some nausea but never threw up. No sore throat. Slightly better today -some body aches -no fever that she's aware of - not wheezing - some shortness of breath  - albuterol only with coughing fits, uses breo each morning, still on Flonase and singulair A/P: You have a viral sinusitis that is irritating your asthma particular the cough portion and given response to albuterol (asthma exacerbation and currently poorly controlled-discussed if she has many more exacerbations we may need to adjust her Breo or refer to pulmonology) -continue albuterol as needed- perhaps every 6 hours for next 24 hours to give time for prednisone to kick in- around 24-48 hours - if your symptoms worsen again I need you to let me know- since you  are improving today that would be double sickening and an indication of bacterial sinusitis- she does well with azithromycin thankfully especially in light of penicillin/Augmentin allergy.  We also mentioned possibly doxycycline but she would prefer azithromycin due to tolerance -Flu and COVID-negative as above   #Hypertension S: medication:  losartan 100 mg  BP Readings from Last 3 Encounters:  07/21/23 118/80  05/02/23 138/76  02/10/23 124/80   A/P: Controlled. Continue current medications.    Recommended  follow up: Return for as needed for new, worsening, persistent symptoms. Future Appointments  Date Time Provider Department Center  08/14/2023 11:00 AM [name], MD [facility]  10/24/2023  4:00 PM Chrystie Nose, MD [facility]  02/13/2024  9:00 AM [name], MD [facility]    Lab/Order associations:   ICD-10-CM   1. Viral sinusitis  J32.9    B97.89     2. Asthma in adult, moderate persistent, with acute exacerbation  J45.41     3. Essential hypertension, benign  I10     4. Cough, unspecified type  R05.9 POC COVID-19    POCT Influenza A/B      Meds ordered this encounter  Medications   albuterol (PROVENTIL) (2.5 MG/3ML) 0.083% nebulizer solution    Sig: Take 3 mLs (2.5 mg total) by nebulization every 6 (six) hours as needed for wheezing or shortness of breath.    Dispense:  150 mL    Refill:  1   predniSONE (DELTASONE) 20 MG tablet    Sig: Take 2 pills for 3 days, 1 pill for 4 days    Dispense:  10 tablet    Refill:  0   albuterol (VENTOLIN HFA) 108 (90 Base) MCG/ACT inhaler    Sig: Inhale 2 puffs into the lungs every 6 (six) hours as needed for wheezing or shortness of breath.    Dispense:  1 each    Refill:  2    Return precautions advised.  [name], MD

## 2023-07-21 NOTE — Patient Instructions (Addendum)
You have a viral sinusitis that is irritating your asthma -continue albuterol as needed- perhaps every 6 hours for next 24 hours to give time for prednisone to kick in- around 24-48 hours - if your symptoms worsen again I need you to let me know- since you are improving today that would be double sickening and an indication of bacterial sinusitis- she does well with azithromycin thankfully  Once you heal up- Prevnar 20 at pharmacy or we can do at next visit  Recommended follow up: Return for as needed for new, worsening, persistent symptoms.

## 2023-07-22 MED ORDER — ALBUTEROL SULFATE (2.5 MG/3ML) 0.083% IN NEBU: 2.5000 mg | INHALATION_SOLUTION | Freq: Four times a day (QID) | RESPIRATORY_TRACT | 1 refills | Status: AC | PRN

## 2023-07-22 NOTE — Telephone Encounter (Signed)
This was sent yesterday at her office visit. Rx has been resent.

## 2023-08-14 ENCOUNTER — Ambulatory Visit: Payer: BC Managed Care – PPO | Admitting: Family Medicine

## 2023-09-01 ENCOUNTER — Ambulatory Visit: Payer: BC Managed Care – PPO | Admitting: Family Medicine

## 2023-09-04 DIAGNOSIS — H5203 Hypermetropia, bilateral: Secondary | ICD-10-CM | POA: Diagnosis not present

## 2023-09-04 DIAGNOSIS — H04123 Dry eye syndrome of bilateral lacrimal glands: Secondary | ICD-10-CM | POA: Diagnosis not present

## 2023-09-11 ENCOUNTER — Other Ambulatory Visit: Payer: Self-pay | Admitting: Family Medicine

## 2023-09-15 ENCOUNTER — Ambulatory Visit: Admitting: Family Medicine

## 2023-09-15 ENCOUNTER — Encounter: Payer: Self-pay | Admitting: Family Medicine

## 2023-09-15 VITALS — BP 130/82 | HR 87 | Wt 199.8 lb

## 2023-09-15 DIAGNOSIS — E785 Hyperlipidemia, unspecified: Secondary | ICD-10-CM

## 2023-09-15 DIAGNOSIS — M1990 Unspecified osteoarthritis, unspecified site: Secondary | ICD-10-CM | POA: Diagnosis not present

## 2023-09-15 DIAGNOSIS — F411 Generalized anxiety disorder: Secondary | ICD-10-CM

## 2023-09-15 DIAGNOSIS — F325 Major depressive disorder, single episode, in full remission: Secondary | ICD-10-CM

## 2023-09-15 DIAGNOSIS — I10 Essential (primary) hypertension: Secondary | ICD-10-CM | POA: Diagnosis not present

## 2023-09-15 DIAGNOSIS — M069 Rheumatoid arthritis, unspecified: Secondary | ICD-10-CM

## 2023-09-15 NOTE — Progress Notes (Signed)
 Phone [phone] In person visit   Subjective:   Shannon Mckenzie is a 63 y.o. year old very pleasant female patient who presents for/with See problem oriented charting Chief Complaint  Patient presents with   Medical Management of Chronic Issues   Hypertension   Hyperlipidemia   Back Pain   Past Medical History-  Patient Active Problem List   Diagnosis Date Noted   Major depression in full remission (HCC) 05/08/2017    Priority: High   Lichen sclerosus 02/07/2022    Priority: Medium    Statin intolerance 01/07/2022    Priority: Medium    Status post Nissen fundoplication 10/01/2019    Priority: Medium    Murmur 07/16/2018    Priority: Medium    Hiatal hernia with GERD without esophagitis 01/10/2018    Priority: Medium    Carpal tunnel syndrome 05/26/2017    Priority: Medium    Former smoker 05/26/2017    Priority: Medium    GAD (generalized anxiety disorder) 05/08/2017    Priority: Medium    Osteoarthritis 03/13/2015    Priority: Medium    Anemia 04/16/2013    Priority: Medium    Extrinsic asthma, mild persistent, uncomplicated 03/20/2013    Priority: Medium    Upper airway cough syndrome ? all related to sinus dz 03/19/2013    Priority: Medium    Essential hypertension, benign 04/15/2012    Priority: Medium    Hyperlipidemia 04/15/2012    Priority: Medium    Rectal pain 10/10/2020    Priority: Low   Urinary incontinence 11/30/2019    Priority: Low   Allergic rhinitis 05/26/2017    Priority: Low   Iron deficiency 02/20/2016    Priority: Low   Cough 03/13/2015    Priority: Low   Back pain 11/10/2014    Priority: Low   Rash and nonspecific skin eruption 07/21/2014    Priority: Low   Cold feeling 05/03/2014    Priority: Low   Sinusitis, chronic 06/14/2013    Priority: Low   Obesity 04/15/2012    Priority: Low    Medications- reviewed and updated Current Outpatient Medications  Medication Sig Dispense Refill   albuterol (PROVENTIL) (2.5  MG/3ML) 0.083% nebulizer solution Take 3 mLs (2.5 mg total) by nebulization every 6 (six) hours as needed for wheezing or shortness of breath. 150 mL 1   albuterol (VENTOLIN HFA) 108 (90 Base) MCG/ACT inhaler Inhale 2 puffs into the lungs every 6 (six) hours as needed for wheezing or shortness of breath. 1 each 2   ascorbic acid (VITAMIN C) 500 MG tablet Take 500 mg by mouth daily.     BREO ELLIPTA 100-25 MCG/ACT AEPB INHALE 1 PUFF INTO THE LUNGS DAILY 180 each 3   cholecalciferol (VITAMIN D3) 25 MCG (1000 UT) tablet Take 1,000 Units by mouth daily.     clobetasol cream (TEMOVATE) 0.05 % Apply 1 Application topically 2 (two) times daily. As needed for lichen sclerosus- from dermatology but would be willing to fill as PCP     Cyanocobalamin (VITAMIN B 12) 500 MCG TABS Take 1,000 mcg by mouth daily at 12 noon.     diclofenac (VOLTAREN) 75 MG EC tablet TAKE 1 TABLET(75 MG) BY MOUTH TWICE DAILY 180 tablet 1   escitalopram (LEXAPRO) 20 MG tablet TAKE 1 TABLET(20 MG) BY MOUTH DAILY 90 tablet 3   esomeprazole (NEXIUM) 40 MG capsule Take 1 capsule (40 mg total) by mouth 2 (two) times daily. 180 capsule 3   Evolocumab (REPATHA SURECLICK)  140 MG/ML SOAJ ADMINISTER 1 ML UNDER THE SKIN EVERY 14 DAYS 6 mL 3   ezetimibe (ZETIA) 10 MG tablet TAKE 1 TABLET(10 MG) BY MOUTH DAILY 90 tablet 3   fluticasone (FLONASE) 50 MCG/ACT nasal spray SHAKE LIQUID AND USE 1 SPRAY IN EACH NOSTRIL DAILY 48 g 3   losartan (COZAAR) 100 MG tablet TAKE 1 TABLET(100 MG) BY MOUTH DAILY 90 tablet 3   montelukast (SINGULAIR) 10 MG tablet TAKE 1 TABLET(10 MG) BY MOUTH AT BEDTIME 90 tablet 3   Multiple Vitamin (MULTIVITAMIN WITH MINERALS) TABS tablet Take 1 tablet by mouth daily.     predniSONE (DELTASONE) 20 MG tablet Take 2 pills for 3 days, 1 pill for 4 days 10 tablet 0   No current facility-administered medications for this visit.     Objective:  BP 130/82   Pulse 87   LMP 03/13/2015   SpO2 96%  Gen: NAD, resting comfortably CV:  RRR no murmurs rubs or gallops Lungs: CTAB no crackles, wheeze, rhonchi Ext: trace edema Skin: warm, dry Musculoskeletal: some swelling at distal interphalangeal\ joints on both hands    Assessment and Plan   # Social update- was able to pull back- no longer caring for 2 ladies and finished caring for brother in law.    #Osteoarthritis-back pain plus s/p bilateral hip replacements #Rheumatoid arthritis- listed 2018 by [facility] Rheumatology S: Has seen physical therapy and rheumatology in the past-rheumatology told her she could be released to me - ongoing issues with back, fingers. Hips are not as bad with replacement. Feet also bother her -Likes to have diclofenac on hand-aware of risks- had been off for a while- now using about once a day with worsening pain  A/P: ongoing issues- but worsening lately even with restarting diclofenac.   we did look back on last rheumatology note 01/17/17 and they noted rheumatoid arthritis without active evidence at that time but with her flare in pain shed like to return to see rheumatology but prefers to get 2nd opinion   #Hypertension S: medication:  losartan 100 mg  BP Readings from Last 3 Encounters:  09/15/23 130/82  07/21/23 118/80  05/02/23 138/76  A/P: well controlled continue current medications      # Coronary artery calcium (negative LP(a))  #Hyperlipidemia-working with lipid clinic S: Patient compliant with Zetia 10 mg daily and Repatha 140 mg every 2 weeks - intolerant to even once a week rosuvastatin and multiple other statins. LDL goal closer to 100 but has myalgias Lab Results  Component Value Date   CHOL 150 02/10/2023   HDL 59.00 02/10/2023   LDLCALC 76 02/10/2023   LDLDIRECT 171.0 01/09/2018   TRIG 76.0 02/10/2023   CHOLHDL 3 02/10/2023  A/P: close to ideal goal with current medications- continue current medications - recheck lipids next visit     #Asthma S: Patient remains on Singulair and Breo 100-25 mcg 1 puff daily .   Infrequent albuterol use. A/P: stable- continue current medicines      #GERD S: Compliant with Nexium 40 mg twice daily most of time A/P: stable- continue current medicines      #Depression/GAD S: Controlled on Lexapro 20 mg.  - Seeing counselor  in past - not needed 2024 and into 2025    02/10/2023    9:43 AM 08/09/2022   10:39 AM 01/07/2022   11:05 AM 10/10/2020    3:09 PM  GAD 7 : Generalized Anxiety Score  Nervous, Anxious, on Edge 0 0 1 0  Control/stop worrying 0 0 1 0  Worry too much - different things 0 1 2 0  Trouble relaxing 0 0 1 0  Restless 0 0 0 0  Easily annoyed or irritable 0 0 1 0  Afraid - awful might happen 0 0 0 0  Total GAD 7 Score 0 1 6 0  Anxiety Difficulty Not difficult at all Not difficult at all Somewhat difficult Not difficult at all      09/15/2023    9:13 AM 02/10/2023    9:42 AM 12/11/2022   10:04 AM  Depression screen PHQ 2/9  Decreased Interest 0 0 0  Down, Depressed, Hopeless 0 0 0  PHQ - 2 Score 0 0 0  Altered sleeping 0 0 0  Tired, decreased energy 0 1 0  Change in appetite 0 0 0  Feeling bad or failure about yourself  0 0 0  Trouble concentrating 0 0 0  Moving slowly or fidgety/restless 0 0 0  Suicidal thoughts 0 0 0  PHQ-9 Score 0 1 0  Difficult doing work/chores Not difficult at all Not difficult at all Not difficult at all  A/P: reasonable control- has a lot of stress living with husband- was out of home for about a year and found helpful-     #allergies- takes flonase and singulair - doing ok right now  Recommended follow up: No follow-ups on file. Future Appointments  Date Time Provider Department Center  10/24/2023  4:00 PM Chrystie Nose, MD [facility]  02/13/2024  9:00 AM [name], MD [facility]   Lab/Order associations:   ICD-10-CM   1. Essential hypertension, benign  I10     2. Hyperlipidemia, unspecified hyperlipidemia type  E78.5     3. Osteoarthritis, unspecified osteoarthritis type, unspecified site   M19.90 Ambulatory referral to Rheumatology    4. GAD (generalized anxiety disorder)  F41.1     5. Major depressive disorder with single episode, in full remission (HCC)  F32.5     6. Rheumatoid arthritis, involving unspecified site, unspecified whether rheumatoid factor present (HCC)  M06.9 Ambulatory referral to Rheumatology      No orders of the defined types were placed in this encounter.   Return precautions advised.  [name], MD

## 2023-09-15 NOTE — Patient Instructions (Addendum)
 We have placed a referral for you today to [facility] Dr. Deanne Coffer or Dr. Kathi Ludwig- please call their # if you do not hear within a week (may be listed below or you may see mychart message within a few days with #).  Address: [address], [place], [place] [address] Phone: [phone]  Could double check with [facility] gastroenterology and make sure but we have 5 years from 2021 which would be early 2026  Holding off on labs since looked so good last time  Recommended follow up: Return in about 6 months (around 03/17/2024) for physical or sooner if needed.Schedule b4 you leave.

## 2023-09-19 ENCOUNTER — Other Ambulatory Visit: Payer: Self-pay | Admitting: Family Medicine

## 2023-09-29 DIAGNOSIS — M25511 Pain in right shoulder: Secondary | ICD-10-CM | POA: Diagnosis not present

## 2023-09-29 DIAGNOSIS — M79642 Pain in left hand: Secondary | ICD-10-CM | POA: Diagnosis not present

## 2023-09-29 DIAGNOSIS — M549 Dorsalgia, unspecified: Secondary | ICD-10-CM | POA: Diagnosis not present

## 2023-09-29 DIAGNOSIS — M79672 Pain in left foot: Secondary | ICD-10-CM | POA: Diagnosis not present

## 2023-09-29 DIAGNOSIS — M2550 Pain in unspecified joint: Secondary | ICD-10-CM | POA: Diagnosis not present

## 2023-09-29 DIAGNOSIS — M79643 Pain in unspecified hand: Secondary | ICD-10-CM | POA: Diagnosis not present

## 2023-09-29 DIAGNOSIS — M79671 Pain in right foot: Secondary | ICD-10-CM | POA: Diagnosis not present

## 2023-09-29 DIAGNOSIS — M79641 Pain in right hand: Secondary | ICD-10-CM | POA: Diagnosis not present

## 2023-09-29 DIAGNOSIS — M1990 Unspecified osteoarthritis, unspecified site: Secondary | ICD-10-CM | POA: Diagnosis not present

## 2023-10-08 ENCOUNTER — Other Ambulatory Visit: Payer: Self-pay

## 2023-10-08 DIAGNOSIS — E7800 Pure hypercholesterolemia, unspecified: Secondary | ICD-10-CM | POA: Diagnosis not present

## 2023-10-08 NOTE — Progress Notes (Signed)
 Sasha from lab requested new order for NMR profile, older order expired  New order placed

## 2023-10-09 LAB — NMR, LIPOPROFILE
Cholesterol, Total: 144 mg/dL (ref 100–199)
HDL Particle Number: 43.1 umol/L (ref >=30.5)
HDL-C: 59 mg/dL (ref >39)
LDL Particle Number: 834 nmol/L (ref <1000)
LDL Size: 21.2 nm (ref >20.5)
LDL-C (NIH Calc): 68 mg/dL (ref 0–99)
LP-IR Score: 40 (ref <=45)
Small LDL Particle Number: 381 nmol/L (ref <=527)
Triglycerides: 91 mg/dL (ref 0–149)

## 2023-10-10 ENCOUNTER — Encounter: Payer: Self-pay | Admitting: Cardiovascular Disease

## 2023-10-24 ENCOUNTER — Encounter (HOSPITAL_BASED_OUTPATIENT_CLINIC_OR_DEPARTMENT_OTHER): Payer: Self-pay | Admitting: Internal Medicine

## 2023-10-24 ENCOUNTER — Ambulatory Visit (HOSPITAL_BASED_OUTPATIENT_CLINIC_OR_DEPARTMENT_OTHER): Payer: BC Managed Care – PPO | Admitting: Internal Medicine

## 2023-10-24 VITALS — BP 132/84 | HR 81 | Ht 61.0 in | Wt 200.0 lb

## 2023-10-24 DIAGNOSIS — E7800 Pure hypercholesterolemia, unspecified: Secondary | ICD-10-CM

## 2023-10-24 DIAGNOSIS — I2510 Atherosclerotic heart disease of native coronary artery without angina pectoris: Secondary | ICD-10-CM | POA: Diagnosis not present

## 2023-10-24 DIAGNOSIS — T466X5A Adverse effect of antihyperlipidemic and antiarteriosclerotic drugs, initial encounter: Secondary | ICD-10-CM

## 2023-10-24 DIAGNOSIS — M791 Myalgia, unspecified site: Secondary | ICD-10-CM

## 2023-10-24 DIAGNOSIS — T466X5D Adverse effect of antihyperlipidemic and antiarteriosclerotic drugs, subsequent encounter: Secondary | ICD-10-CM | POA: Diagnosis not present

## 2023-10-24 NOTE — Patient Instructions (Signed)
 Medication Instructions:  Your physician recommends that you continue on your current medications as directed. Please refer to the Current Medication list given to you today.  *If you need a refill on your cardiac medications before your next appointment, please call your pharmacy*  Follow-Up: At [facility], you and your health needs are our priority.  As part of our continuing mission to provide you with exceptional heart care, our providers are all part of one team.  This team includes your primary Cardiologist (physician) and Advanced Practice Providers or APPs (Physician Assistants and Nurse Practitioners) who all work together to provide you with the care you need, when you need it.  Your next appointment:   12 month(s)  Provider:   Slater Duncan, NP

## 2023-10-24 NOTE — Progress Notes (Signed)
 LIPID CLINIC CONSULT NOTE  Chief Complaint:  Follow-up dyslipidemia  Primary Care Physician: Almira Jaeger, MD  Primary Cardiologist:  None  HPI:  Shannon Mckenzie is a 63 y.o. female who is being seen today for the evaluation of dyslipidemia at the request of Almira Jaeger, MD. this is a pleasant 63 year old female kindly referred for evaluation management of dyslipidemia.  She reports a longstanding history of high cholesterol.  There was also some heart disease in her family including her mother who had some congestive heart failure.  She unfortunately has been intolerant of statins having tried more than 3 in the past all of which caused muscle aches including low-dose statins.  She has been able to tolerate ezetimibe .  She said she was a previous patient of Dr. Janie Meier a number of years ago who encouraged her to stay on the ezetimibe .  She reports that even with weight loss and dietary changes her cholesterol has not come down significantly.  Her most recent on treatment lipid showed total cholesterol 230, triglycerides 68, HDL 55 and LDL 163.  When she was not on ezetimibe  her LDL was well over 190 concerning for familial hyperlipidemia.  Of note, she had a CT pulmonary angiogram performed presumably in the emergency department for shortness of breath in October 2020.  This was negative for PE or dissection but did show some LAD coronary calcification per my review of the films although it was not reported by radiology.  09/09/2022  [name] returns today for follow-up of dyslipidemia.  Overall she says she is feeling well on Repatha .  She has no significant side effects.  Her cholesterols come down substantially.  Her LDL particle number is 788, LDL-C is 64, HDL-C of 62, triglycerides were 81 and small LDL particle #345.  LP(a) was negative at 21.8 nmol/L.  10/24/2023  [name] is seen today in follow-up.  Overall she feels well.  She denies any chest pain that is  cardiac or shortness of breath.  She continues on Repatha .  Overall it is working well for her.  Her LDL particle number is almost similar to last year at 834 with an LDL 68, HDL 59 and triglycerides 91.  PMHx:  Past Medical History:  Diagnosis Date   Allergy     Anemia    Anxiety    Aspiration pneumonia (HCC) 09/10/2019   Asthma    Depression    DJD (degenerative joint disease) of hip    Former smoker    GERD (gastroesophageal reflux disease)    Hand pain, right    Heart murmur    Hiatal hernia    History of kidney stones    history   Hyperlipidemia    Hypertension    Kidney stone    Osteoarthritis    Pain, hip    right   Pneumonia    hx but no hospitalization   Reactive airway disease    RLS (restless legs syndrome)    Routine gynecological examination    sees gynecology, [name] NP   SOB (shortness of breath) 9/14   cardiac eval with stress test and echo; Dr. Janie Meier   Statin intolerance    Vision problems    wears contact lenses    Past Surgical History:  Procedure Laterality Date   bilateral hip replacement     nov 21st 2019 right, Jun 09 2018 left    CARPAL TUNNEL RELEASE Left 07/03/2017   Procedure: LEFT CARPAL TUNNEL RELEASE;  Surgeon: Brunilda Capra, MD;  Location: [facility];  Service: Orthopedics;  Laterality: Left;   COLONOSCOPY  06/2012   Dr. Gaylin Ke; repeat 10 years   HIATAL HERNIA REPAIR N/A 10/01/2019   Procedure: LAPAROSCOPIC REPAIR OF HIATAL HERNIA WITH NISSEN FUNDOPLICATION, UPPER ENDO, REMOVAL OF GASTRIC MASS;  Surgeon: Jacolyn Matar, MD;  Location: [facility];  Service: General;  Laterality: N/A;   UPPER GI ENDOSCOPY N/A 07/24/2012   Hiatus Hernia,normal stomach, normal examined duodenum   WISDOM TOOTH EXTRACTION      FAMHx:  Family History  Problem Relation Age of Onset   Arthritis Mother    Asthma Mother    Diabetes Mother    Cirrhosis Mother        died of liver nonalcoholic cirrhosis   COPD Mother    Heart disease  Mother        CHF   Hypertension Mother    Kidney disease Mother    Liver disease Mother    Eating disorder Mother    Obesity Mother    Cancer Father        died of pancreatic cancer at age 88   Alcohol abuse Father    Asthma Sister        doesnt know history well   Drug abuse Sister    Other Sister    Drug abuse Sister        doesnt know history well   Depression Brother    Alcohol abuse Brother    Other Brother        died of MVA   Heart attack Maternal Grandfather 52   Stroke Maternal Aunt        died age 78yo of CVA    SOCHx:   reports that she quit smoking about 32 years ago. Her smoking use included cigarettes. She started smoking about 47 years ago. She has a 15 pack-year smoking history. She has never used smokeless tobacco. She reports current alcohol use. She reports that she does not use drugs.  ALLERGIES:  Allergies  Allergen Reactions   Statins     Myalgias with all statins   Amoxicillin Hives and Rash   Penicillins Hives, Other (See Comments) and Rash    ROS: Pertinent items noted in HPI and remainder of comprehensive ROS otherwise negative.  HOME MEDS: Current Outpatient Medications on File Prior to Visit  Medication Sig Dispense Refill   albuterol  (PROVENTIL ) (2.5 MG/3ML) 0.083% nebulizer solution Take 3 mLs (2.5 mg total) by nebulization every 6 (six) hours as needed for wheezing or shortness of breath. 150 mL 1   albuterol  (VENTOLIN  HFA) 108 (90 Base) MCG/ACT inhaler Inhale 2 puffs into the lungs every 6 (six) hours as needed for wheezing or shortness of breath. 1 each 2   ascorbic acid  (VITAMIN C) 500 MG tablet Take 500 mg by mouth daily.     BREO ELLIPTA  100-25 MCG/ACT AEPB INHALE 1 PUFF INTO THE LUNGS DAILY 180 each 3   cholecalciferol  (VITAMIN D3) 25 MCG (1000 UT) tablet Take 1,000 Units by mouth daily.     clobetasol cream (TEMOVATE) 0.05 % Apply 1 Application topically 2 (two) times daily. As needed for lichen sclerosus- from dermatology but  would be willing to fill as PCP     Cyanocobalamin  (VITAMIN B 12) 500 MCG TABS Take 1,000 mcg by mouth daily at 12 noon.     diclofenac  (VOLTAREN ) 75 MG EC tablet TAKE 1 TABLET(75 MG) BY MOUTH TWICE DAILY 180 tablet 1  escitalopram  (LEXAPRO ) 20 MG tablet TAKE 1 TABLET(20 MG) BY MOUTH DAILY 90 tablet 3   esomeprazole  (NEXIUM ) 40 MG capsule Take 1 capsule (40 mg total) by mouth 2 (two) times daily. 180 capsule 3   Evolocumab  (REPATHA  SURECLICK) 140 MG/ML SOAJ ADMINISTER 1 ML UNDER THE SKIN EVERY 14 DAYS 6 mL 3   ezetimibe  (ZETIA ) 10 MG tablet TAKE 1 TABLET(10 MG) BY MOUTH DAILY 90 tablet 3   fluticasone  (FLONASE ) 50 MCG/ACT nasal spray SHAKE BOTTLE AND USE 1 SPRAY IN EACH NOSTRIL DAILY 16 g 3   losartan  (COZAAR ) 100 MG tablet TAKE 1 TABLET(100 MG) BY MOUTH DAILY 90 tablet 3   montelukast  (SINGULAIR ) 10 MG tablet TAKE 1 TABLET(10 MG) BY MOUTH AT BEDTIME 90 tablet 3   Multiple Vitamin (MULTIVITAMIN WITH MINERALS) TABS tablet Take 1 tablet by mouth daily.     No current facility-administered medications on file prior to visit.    LABS/IMAGING: No results found for this or any previous visit (from the past 48 hours). No results found.  LIPID PANEL:    Component Value Date/Time   CHOL 150 02/10/2023 0942   CHOL 273 (H) 02/19/2018 1207   TRIG 76.0 02/10/2023 0942   HDL 59.00 02/10/2023 0942   HDL 51 02/19/2018 1207   CHOLHDL 3 02/10/2023 0942   VLDL 15.2 02/10/2023 0942   LDLCALC 76 02/10/2023 0942   LDLCALC 197 (H) 02/19/2018 1207   LDLDIRECT 171.0 01/09/2018 0958    WEIGHTS: Wt Readings from Last 3 Encounters:  10/24/23 200 lb (90.7 kg)  09/15/23 199 lb 12.8 oz (90.6 kg)  07/21/23 196 lb 3.2 oz (89 kg)    VITALS: BP 132/84   Pulse 81   Ht 5\' 1"  (1.549 m)   Wt 200 lb (90.7 kg)   LMP 03/13/2015   SpO2 95%   BMI 37.79 kg/m   EXAM: Deferred  EKG: Deferred   ASSESSMENT: Possible familial hyperlipidemia, untreated LDL greater than 190 First-degree relative with heart  failure Statin myalgias Coronary artery calcification Negative LP(a)  PLAN: 1.   Ms. Moraitis continues to do well with the well treated dyslipidemia on Repatha .  She does have coronary calcification and family history of heart disease.  I like for her to continue to follow least peripherally with cardiology.  Will plan to follow-up with our lipid APP in a year or sooner as necessary.  Hazle Lites, MD, [facility], FNLA, FACP  [place]  [facility]  Medical Director of the [facility] Lipid [facility] &  Cardiovascular Risk Reduction Clinic Diplomate of the American Board of Clinical Lipidology Attending Cardiologist  Direct Dial: [phone]  Fax: [phone]  Website:  [email or web address] 10/24/2023, 3:55 PM

## 2023-10-28 DIAGNOSIS — Z1231 Encounter for screening mammogram for malignant neoplasm of breast: Secondary | ICD-10-CM | POA: Diagnosis not present

## 2023-10-28 DIAGNOSIS — Z01419 Encounter for gynecological examination (general) (routine) without abnormal findings: Secondary | ICD-10-CM | POA: Diagnosis not present

## 2023-10-28 DIAGNOSIS — Z130 Encounter for screening for diseases of the blood and blood-forming organs and certain disorders involving the immune mechanism: Secondary | ICD-10-CM | POA: Diagnosis not present

## 2023-12-09 ENCOUNTER — Other Ambulatory Visit: Payer: Self-pay | Admitting: Family Medicine

## 2024-01-08 ENCOUNTER — Other Ambulatory Visit: Payer: Self-pay | Admitting: Family Medicine

## 2024-01-08 DIAGNOSIS — E785 Hyperlipidemia, unspecified: Secondary | ICD-10-CM

## 2024-01-08 DIAGNOSIS — Z789 Other specified health status: Secondary | ICD-10-CM

## 2024-02-05 ENCOUNTER — Ambulatory Visit: Payer: Self-pay

## 2024-02-05 NOTE — Telephone Encounter (Signed)
 FYI Only or Action Required?: Action required by provider: request for appointment.  Patient was last seen in primary care on 09/15/2023 by Katrinka Garnette KIDD, MD.  Called Nurse Triage reporting Cough.  Symptoms began a week ago.  Interventions attempted: Prescription medications: inhaler.  Symptoms are: gradually worsening. Pt. Did see a NP and took antibiotic, Prednisone . Now is worse.  Triage Disposition: See HCP Within 4 Hours (Or PCP Triage)  Patient/caregiver understands and will follow disposition?: Yes   Copied from CRM #[id number]. Topic: Clinical - Red Word Triage >> Feb 05, 2024  8:44 AM Burnard DEL wrote: Red Word that prompted transfer to Nurse Triage: bad cough,chest congestion,green mucous coming out nose and throat Reason for Disposition  [1] MILD difficulty breathing (e.g., minimal/no SOB at rest, SOB with walking, pulse < 100) AND [2] still present when not coughing  Answer Assessment - Initial Assessment Questions 1. ONSET: When did the cough begin?      This week 2. SEVERITY: How bad is the cough today?      severe 3. SPUTUM: Describe the color of your sputum (e.g., none, dry cough; clear, white, yellow, green)     Green, thick 4. HEMOPTYSIS: Are you coughing up any blood? If Yes, ask: How much? (e.g., flecks, streaks, tablespoons, etc.)     no 5. DIFFICULTY BREATHING: Are you having difficulty breathing? If Yes, ask: How bad is it? (e.g., mild, moderate, severe)      mild 6. FEVER: Do you have a fever? If Yes, ask: What is your temperature, how was it measured, and when did it start?     no 7. CARDIAC HISTORY: Do you have any history of heart disease? (e.g., heart attack, congestive heart failure)      no 8. LUNG HISTORY: Do you have any history of lung disease?  (e.g., pulmonary embolus, asthma, emphysema)     asthma 9. PE RISK FACTORS: Do you have a history of blood clots? (or: recent major surgery, recent prolonged travel, bedridden)      no 10. OTHER SYMPTOMS: Do you have any other symptoms? (e.g., runny nose, wheezing, chest pain)       Runny nose , wheezing, fatigue 11. PREGNANCY: Is there any chance you are pregnant? When was your last menstrual period?       no 12. TRAVEL: Have you traveled out of the country in the last month? (e.g., travel history, exposures)       no  Protocols used: Cough - Acute Productive-A-AH

## 2024-02-06 ENCOUNTER — Encounter: Payer: Self-pay | Admitting: Family Medicine

## 2024-02-06 ENCOUNTER — Ambulatory Visit: Admitting: Family Medicine

## 2024-02-06 VITALS — BP 118/82 | HR 94 | Temp 98.8°F | Ht 61.0 in | Wt 188.8 lb

## 2024-02-06 DIAGNOSIS — J329 Chronic sinusitis, unspecified: Secondary | ICD-10-CM

## 2024-02-06 DIAGNOSIS — J4541 Moderate persistent asthma with (acute) exacerbation: Secondary | ICD-10-CM

## 2024-02-06 DIAGNOSIS — B9689 Other specified bacterial agents as the cause of diseases classified elsewhere: Secondary | ICD-10-CM

## 2024-02-06 DIAGNOSIS — E785 Hyperlipidemia, unspecified: Secondary | ICD-10-CM

## 2024-02-06 DIAGNOSIS — I10 Essential (primary) hypertension: Secondary | ICD-10-CM

## 2024-02-06 MED ORDER — DOXYCYCLINE HYCLATE 100 MG PO TABS
100.0000 mg | ORAL_TABLET | Freq: Two times a day (BID) | ORAL | 0 refills | Status: AC
Start: 2024-02-06 — End: 2024-02-13

## 2024-02-06 MED ORDER — PREDNISONE 20 MG PO TABS
ORAL_TABLET | ORAL | 0 refills | Status: DC
Start: 2024-02-06 — End: 2024-02-16

## 2024-02-06 NOTE — Progress Notes (Signed)
 Phone [phone] In person visit   Subjective:   Shannon Mckenzie is a 63 y.o. year old very pleasant female patient who presents for/with See problem oriented charting Chief Complaint  Patient presents with   Cough    Pt finished pred and abx 02/03/2024   Discussed the use of AI scribe software for clinical note transcription with the patient, who gave verbal consent to proceed.  History of Present Illness   Shannon Mckenzie is a 63 year old female with asthma who presents with persistent respiratory symptoms.  Respiratory symptoms - Persistent cough and congestion for approximately two weeks - Green sputum production - Sensation of chest congestion without pain - Rib pain associated with coughing - No shortness of breath with exertion - Chest tightness present typical for her asthma  - Nasal congestion with ongoing nasal discharge, slightly improved in last day - No fever or chills throughout illness  Asthma management - Uses Breo for asthma maintenance - Increased use of albuterol  to twice daily due to symptoms, with improvement when using - Completed a five-day course of azithromycin  and prednisone , with initial significant improvement in symptoms - Symptoms recurred after completion of antibiotics and steroids, resulting in feeling unwell and spending several days in bed  Mood and affect - Felt 'happy' while on prednisone , in contrast to a 'blah' feeling over the past months  Allergic rhinitis - Uses Singulair  and Flonase  for allergies  Musculoskeletal symptoms - Uses Voltaren  for joint pain - Takes vitamin D  regularly, which she feels helps with joint pain  Other chronic conditions - Takes losartan  for hypertension - Takes Zetia  and Repatha  for hyperlipidemia - Takes Nexium  for gastroesophageal reflux disease - Takes Lexapro  for depression      Past Medical History-  Patient Active Problem List   Diagnosis Date Noted   Major depression in full  remission (HCC) 05/08/2017    Priority: High   Lichen sclerosus 02/07/2022    Priority: Medium    Statin intolerance 01/07/2022    Priority: Medium    Status post Nissen fundoplication 10/01/2019    Priority: Medium    Murmur 07/16/2018    Priority: Medium    Hiatal hernia with GERD without esophagitis 01/10/2018    Priority: Medium    Carpal tunnel syndrome 05/26/2017    Priority: Medium    Former smoker 05/26/2017    Priority: Medium    GAD (generalized anxiety disorder) 05/08/2017    Priority: Medium    Osteoarthritis 03/13/2015    Priority: Medium    Anemia 04/16/2013    Priority: Medium    Extrinsic asthma, mild persistent, uncomplicated 03/20/2013    Priority: Medium    Upper airway cough syndrome ? all related to sinus dz 03/19/2013    Priority: Medium    Essential hypertension, benign 04/15/2012    Priority: Medium    Hyperlipidemia 04/15/2012    Priority: Medium    Rectal pain 10/10/2020    Priority: Low   Urinary incontinence 11/30/2019    Priority: Low   Allergic rhinitis 05/26/2017    Priority: Low   Iron deficiency 02/20/2016    Priority: Low   Cough 03/13/2015    Priority: Low   Back pain 11/10/2014    Priority: Low   Rash and nonspecific skin eruption 07/21/2014    Priority: Low   Cold feeling 05/03/2014    Priority: Low   Sinusitis, chronic 06/14/2013    Priority: Low   Obesity 04/15/2012    Priority:  Low    Medications- reviewed and updated Current Outpatient Medications  Medication Sig Dispense Refill   albuterol  (PROVENTIL ) (2.5 MG/3ML) 0.083% nebulizer solution Take 3 mLs (2.5 mg total) by nebulization every 6 (six) hours as needed for wheezing or shortness of breath. 150 mL 1   albuterol  (VENTOLIN  HFA) 108 (90 Base) MCG/ACT inhaler Inhale 2 puffs into the lungs every 6 (six) hours as needed for wheezing or shortness of breath. 1 each 2   ascorbic acid  (VITAMIN C) 500 MG tablet Take 500 mg by mouth daily.     BREO ELLIPTA  100-25 MCG/ACT  AEPB INHALE 1 PUFF INTO THE LUNGS DAILY 180 each 3   cholecalciferol  (VITAMIN D3) 25 MCG (1000 UT) tablet Take 1,000 Units by mouth daily.     clobetasol cream (TEMOVATE) 0.05 % Apply 1 Application topically 2 (two) times daily. As needed for lichen sclerosus- from dermatology but would be willing to fill as PCP     Cyanocobalamin  (VITAMIN B 12) 500 MCG TABS Take 1,000 mcg by mouth daily at 12 noon.     diclofenac  (VOLTAREN ) 75 MG EC tablet TAKE 1 TABLET(75 MG) BY MOUTH TWICE DAILY 180 tablet 1   doxycycline  (VIBRA -TABS) 100 MG tablet Take 1 tablet (100 mg total) by mouth 2 (two) times daily for 7 days. 14 tablet 0   escitalopram  (LEXAPRO ) 20 MG tablet TAKE 1 TABLET(20 MG) BY MOUTH DAILY 90 tablet 3   esomeprazole  (NEXIUM ) 40 MG capsule Take 1 capsule (40 mg total) by mouth 2 (two) times daily. 180 capsule 3   Evolocumab  (REPATHA  SURECLICK) 140 MG/ML SOAJ ADMINISTER 1 ML UNDER THE SKIN EVERY 14 DAYS 6 mL 3   ezetimibe  (ZETIA ) 10 MG tablet TAKE 1 TABLET(10 MG) BY MOUTH DAILY 90 tablet 3   fluticasone  (FLONASE ) 50 MCG/ACT nasal spray SHAKE BOTTLE AND USE 1 SPRAY IN EACH NOSTRIL DAILY 16 g 3   losartan  (COZAAR ) 100 MG tablet TAKE 1 TABLET(100 MG) BY MOUTH DAILY 90 tablet 3   montelukast  (SINGULAIR ) 10 MG tablet TAKE 1 TABLET(10 MG) BY MOUTH AT BEDTIME 90 tablet 3   Multiple Vitamin (MULTIVITAMIN WITH MINERALS) TABS tablet Take 1 tablet by mouth daily.     predniSONE  (DELTASONE ) 20 MG tablet Take 2 pills for 3 days, 1 pill for 4 days 10 tablet 0   No current facility-administered medications for this visit.     Objective:  BP 118/82 (BP Location: Left Arm, Patient Position: Sitting, Cuff Size: Normal)   Pulse 94   Temp 98.8 F (37.1 C) (Temporal)   Ht 5' 1 (1.549 m)   Wt 188 lb 12.8 oz (85.6 kg)   LMP 03/13/2015   SpO2 91%   BMI 35.67 kg/m  Gen: NAD, resting comfortably Nasal turbinates edematous and erythematous with green discharge. CV: RRR no  rubs or gallops Lungs: Clear to  auscultation bilaterally other than diffuse wheeze Abdomen: soft/nontender/nondistended/normal bowel sounds. No rebound or guarding.  Ext: Minimal edema Skin: warm, dry    Assessment and Plan      Acute bacterial sinusitis with bronchitis and asthma exacerbation Symptoms consistent with acute bacterial sinusitis, bronchitis, and asthma exacerbation. Two weeks of symptoms including green sputum production, chest congestion, rib pain, and nasal discharge. Previous treatment with azithromycin  and prednisone  provided temporary relief. Symptoms worsened post-treatment, indicating possible bacterial infection. Examination reveals wheezing in the lungs and maxillary sinus tenderness, supporting the diagnosis. - Prescribe doxycycline  100 mg for 7 days for bacterial sinusitis. - Prescribe prednisone  for 7  days to manage asthma exacerbation. - Consider chest x-ray if no improvement in 7 to 10 days or if worsening.  Oxygen noted at 91% but patient in no respiratory distress-and with prior improvement on treatment we opted to start treatment though changed from azithromycin  to doxycycline  with penicillin allergy  and do x-ray only if not improving - Continue Singulair  for allergies/asthma.  Continue Flonase  for allergies - Continue Breo for asthma management. - Use albuterol  inhaler as needed for wheezing. -Offered testing for COVID and flu but likely would not change management so we opted out  Hypertension Hypertension is well-controlled with losartan  100mg . Blood pressure readings are within normal limits. - Continue losartan  for hypertension management.  Hyperlipidemia Managed with Zetia  and Repatha . Dr. Mona recommended transitioning the Repatha  prescription to this practice. - Continue Zetia  for hyperlipidemia. - Transition Repatha  prescription to this practice, ensuring insurance coverage.  Depression Reports feeling more down, possibly related to current illness. Currently on Lexapro  20 mg.  Depression scores slightly elevated, likely due to illness-related fatigue and decreased energy. - Continue Lexapro  20 mg for depression. - Reassess depression symptoms after treating the current infection.  Osteoarthritis Managed with Voltaren  and vitamin D . Uses Voltaren  primarily in the mornings and notes improvement with regular vitamin D  intake. - Continue Voltaren  for osteoarthritis as needed. - Continue vitamin D  supplementation. - Consider turmeric 500 mg supplementation for joint pain, monitoring for bleeding risk.      Recommended follow up: Return for as needed for new, worsening, persistent symptoms. Future Appointments  Date Time Provider Department Center  03/26/2024 10:00 AM Katrinka Garnette KIDD, MD [facility]    Lab/Order associations:   ICD-10-CM   1. Bacterial sinusitis  J32.9    B96.89     2. Moderate persistent asthma with acute exacerbation  J45.41     3. Essential hypertension, benign  I10     4. Hyperlipidemia, unspecified hyperlipidemia type  E78.5       Meds ordered this encounter  Medications   doxycycline  (VIBRA -TABS) 100 MG tablet    Sig: Take 1 tablet (100 mg total) by mouth 2 (two) times daily for 7 days.    Dispense:  14 tablet    Refill:  0   predniSONE  (DELTASONE ) 20 MG tablet    Sig: Take 2 pills for 3 days, 1 pill for 4 days    Dispense:  10 tablet    Refill:  0    Return precautions advised.  Garnette Katrinka, MD

## 2024-02-06 NOTE — Patient Instructions (Addendum)
 YOUR PLAN: ACUTE BACTERIAL SINUSITIS WITH ASTHMA EXACERBATION: You have symptoms of a bacterial sinus infection and an asthma flare-up, including green sputum, chest congestion, and rib pain. -Take doxycycline  100 mg for 7 days to treat the bacterial sinus infection. -Take prednisone  for 7 days to manage the asthma exacerbation. -If your symptoms do not improve, we may need to do a chest x-ray- reach out in 7-10 days -Continue using Singulair  for your allergies. -Continue using Breo for asthma management. -Use your albuterol  inhaler as needed for wheezing.  HYPERTENSION: Your blood pressure is well-controlled with your current medication. -Continue taking losartan  for hypertension management.  HYPERLIPIDEMIA: Your cholesterol levels are managed with Zetia  and Repatha . -Continue taking Zetia  for hyperlipidemia. -We will transition your Repatha  prescription to this practice and ensure it is covered by your insurance whenever you need it- we may need their help with prior authorization potentially (Dr. Mona)   DEPRESSION: You have been feeling more down, possibly due to your current illness. -Continue taking Lexapro  20 mg for depression. -We will reassess your depression symptoms after treating your current infection- please follow up if you fail to improve  OSTEOARTHRITIS: You manage your joint pain with Voltaren  and vitamin D . -Continue using Voltaren  as needed for osteoarthritis. -Continue taking vitamin D  supplements. -Consider taking turmeric supplements for joint pain, but monitor for any bleeding issues. - OTC (available over the counter without a prescription) Turmeric 500mg  daily                      Contains text generated by [employer].    Recommended follow up: No follow-ups on file.

## 2024-02-10 ENCOUNTER — Telehealth: Payer: Self-pay | Admitting: Family Medicine

## 2024-02-10 DIAGNOSIS — R059 Cough, unspecified: Secondary | ICD-10-CM

## 2024-02-10 DIAGNOSIS — R0989 Other specified symptoms and signs involving the circulatory and respiratory systems: Secondary | ICD-10-CM

## 2024-02-10 NOTE — Telephone Encounter (Signed)
 Pt still taking medication and she still is not feeling well and is worried she is getting pneumonia. She feels worse when she eats. Would you like her to finish her abx and then let you know she feels? Can she come in for a chest xray?    Copied from CRM #[id number]. Topic: General - Other >> Feb 10, 2024  4:26 PM Thersia C wrote: Reason for CRM: Patient called in stated she saw Dr. Katrinka has been but on zpack and steroids, is feeling a lil better , but sometimes she start coughing more when she eat . Was told to let Dr.Hunter know , would like a callback from him or his nurses

## 2024-02-10 NOTE — Telephone Encounter (Signed)
 Doxycycline  should actually provide pretty good coverage for pneumonia.  Pneumonia is definitely a possibility-her oxygen levels were at 91% but she was breathing well at last visit.  I am okay with the ordering urgent x-ray under cough/congestion

## 2024-02-11 ENCOUNTER — Ambulatory Visit

## 2024-02-11 NOTE — Addendum Note (Signed)
 Addended by: MARDY LEOTIS RAMAN on: 02/11/2024 08:07 AM   Modules accepted: Orders

## 2024-02-13 ENCOUNTER — Encounter: Payer: BC Managed Care – PPO | Admitting: Family Medicine

## 2024-02-14 ENCOUNTER — Ambulatory Visit (INDEPENDENT_AMBULATORY_CARE_PROVIDER_SITE_OTHER)

## 2024-02-14 ENCOUNTER — Ambulatory Visit (HOSPITAL_COMMUNITY)
Admission: EM | Admit: 2024-02-14 | Discharge: 2024-02-14 | Disposition: A | Discharge status: Home / Self Care | Source: Ambulatory Visit | Attending: Internal Medicine | Admitting: Internal Medicine

## 2024-02-14 DIAGNOSIS — R051 Acute cough: Secondary | ICD-10-CM

## 2024-02-14 DIAGNOSIS — R059 Cough, unspecified: Secondary | ICD-10-CM | POA: Diagnosis not present

## 2024-02-14 DIAGNOSIS — M25519 Pain in unspecified shoulder: Secondary | ICD-10-CM | POA: Diagnosis not present

## 2024-02-14 DIAGNOSIS — R42 Dizziness and giddiness: Secondary | ICD-10-CM

## 2024-02-14 DIAGNOSIS — M546 Pain in thoracic spine: Secondary | ICD-10-CM | POA: Insufficient documentation

## 2024-02-14 LAB — COMPREHENSIVE METABOLIC PANEL WITH GFR
ALT: 20 U/L (ref 0–44)
AST: 15 U/L (ref 15–41)
Albumin: 3.5 g/dL (ref 3.5–5.0)
Alkaline Phosphatase: 75 U/L (ref 38–126)
Anion gap: 8 (ref 5–15)
BUN: 32 mg/dL — ABNORMAL HIGH (ref 8–23)
CO2: 25 mmol/L (ref 22–32)
Calcium: 9 mg/dL (ref 8.9–10.3)
Chloride: 106 mmol/L (ref 98–111)
Creatinine, Ser: 1.34 mg/dL — ABNORMAL HIGH (ref 0.44–1.00)
GFR, Estimated: 45 mL/min — ABNORMAL LOW (ref >60)
Glucose, Bld: 94 mg/dL (ref 70–99)
Potassium: 3.6 mmol/L (ref 3.5–5.1)
Sodium: 139 mmol/L (ref 135–145)
Total Bilirubin: 0.6 mg/dL (ref 0.0–1.2)
Total Protein: 6.1 g/dL — ABNORMAL LOW (ref 6.5–8.1)

## 2024-02-14 LAB — POCT FASTING CBG KUC MANUAL ENTRY: POCT Glucose (KUC): 87 mg/dL (ref 70–99)

## 2024-02-14 LAB — CBC
HCT: 38.2 % (ref 36.0–46.0)
Hemoglobin: 12.5 g/dL (ref 12.0–15.0)
MCH: 29.1 pg (ref 26.0–34.0)
MCHC: 32.7 g/dL (ref 30.0–36.0)
MCV: 88.8 fL (ref 80.0–100.0)
Platelets: 206 K/uL (ref 150–400)
RBC: 4.3 MIL/uL (ref 3.87–5.11)
RDW: 13.6 % (ref 11.5–15.5)
WBC: 7.5 K/uL (ref 4.0–10.5)
nRBC: 0 % (ref 0.0–0.2)

## 2024-02-14 MED ORDER — MECLIZINE HCL 25 MG PO TABS: 25.0000 mg | ORAL_TABLET | Freq: Three times a day (TID) | ORAL | 0 refills | Status: AC | PRN

## 2024-02-14 NOTE — Discharge Instructions (Addendum)
 EKG done today and shows normal sinus rhythm.  Chest x-ray done today.  Brief evaluation shows no acute process.  Final evaluation by the radiologist is still pending if there is any changes we will contact you.  Symptoms of dizziness likely secondary to vertigo especially given the history of vertigo.  Vital signs and physical exam findings are reassuring.  We are also getting blood work today including complete blood count, complete metabolic panel.  This will check for anemia, issues with the kidney, issues with the liver and electrolytes.  Blood sugar today was 87.  At this time we will try meclizine  to help with the dizziness.  It is also reassuring that the symptoms are improving.  If symptoms worsen then recommend going to the emergency room for further evaluation.  Keep follow-up appointment with primary care on Monday. Meclizine  25 mg 3 times daily as needed for dizziness.

## 2024-02-14 NOTE — ED Notes (Signed)
 Husband in car waiting

## 2024-02-14 NOTE — ED Provider Notes (Signed)
 [facility]    CSN: [id number] Arrival date & time: 02/14/24  1642      History   Chief Complaint Chief Complaint  Patient presents with   Dizziness   Back Pain   Cough    HPI Shannon Mckenzie is a 63 y.o. female.   63 year old female presents urgent care with complaints of dizziness and persistent cough.  She reports that the dizziness started this morning when she woke up.  She reports that she could almost not get out of bed.  She had to slowly raise herself off the bed.  She did feel like the room was spinning.  She has had vertigo but it has been years since she has had an attack.  She also relates that yesterday she had pain between her shoulder blades that has now resolved.  She also has had a persistent cough since she was diagnosed with a upper respiratory infection about 2 weeks ago.  She finished doxycycline  and prednisone  about a week ago.  She denies any fevers or chills.  She is not having any chest pain or shortness of breath.  She denies headaches, ear pain, visual changes, slurred speech.  She is planning to see her primary care provider on Monday.   Dizziness Associated symptoms: no chest pain, no headaches, no palpitations, no shortness of breath and no vomiting   Back Pain Associated symptoms: no abdominal pain, no chest pain, no dysuria, no fever and no headaches   Cough Associated symptoms: no chest pain, no chills, no ear pain, no fever, no headaches, no rash, no shortness of breath and no sore throat     Past Medical History:  Diagnosis Date   Allergy     Anemia    Anxiety    Aspiration pneumonia (HCC) 09/10/2019   Asthma    Depression    DJD (degenerative joint disease) of hip    Former smoker    GERD (gastroesophageal reflux disease)    Hand pain, right    Heart murmur    Hiatal hernia    History of kidney stones    history   Hyperlipidemia    Hypertension    Kidney stone    Osteoarthritis    Pain, hip    right   Pneumonia     hx but no hospitalization   Reactive airway disease    RLS (restless legs syndrome)    Routine gynecological examination    sees gynecology, [name] NP   SOB (shortness of breath) 9/14   cardiac eval with stress test and echo; Dr. Jacques Somerset   Statin intolerance    Vision problems    wears contact lenses    Patient Active Problem List   Diagnosis Date Noted   Lichen sclerosus 02/07/2022   Statin intolerance 01/07/2022   Rectal pain 10/10/2020   Urinary incontinence 11/30/2019   Status post Nissen fundoplication 10/01/2019   Murmur 07/16/2018   Hiatal hernia with GERD without esophagitis 01/10/2018   Allergic rhinitis 05/26/2017   Carpal tunnel syndrome 05/26/2017   Former smoker 05/26/2017   Major depression in full remission (HCC) 05/08/2017   GAD (generalized anxiety disorder) 05/08/2017   Iron deficiency 02/20/2016   Osteoarthritis 03/13/2015   Cough 03/13/2015   Back pain 11/10/2014   Rash and nonspecific skin eruption 07/21/2014   Cold feeling 05/03/2014   Sinusitis, chronic 06/14/2013   Anemia 04/16/2013   Extrinsic asthma, mild persistent, uncomplicated 03/20/2013   Upper airway cough syndrome ? all  related to sinus dz 03/19/2013   Essential hypertension, benign 04/15/2012   Obesity 04/15/2012   Hyperlipidemia 04/15/2012    Past Surgical History:  Procedure Laterality Date   bilateral hip replacement     nov 21st 2019 right, Jun 09 2018 left    CARPAL TUNNEL RELEASE Left 07/03/2017   Procedure: LEFT CARPAL TUNNEL RELEASE;  Surgeon: Murrell Drivers, MD;  Location: [facility];  Service: Orthopedics;  Laterality: Left;   COLONOSCOPY  06/2012   Dr. Dallas; repeat 10 years   HIATAL HERNIA REPAIR N/A 10/01/2019   Procedure: LAPAROSCOPIC REPAIR OF HIATAL HERNIA WITH NISSEN FUNDOPLICATION, UPPER ENDO, REMOVAL OF GASTRIC MASS;  Surgeon: Gladis Cough, MD;  Location: [facility];  Service: General;  Laterality: N/A;   UPPER GI ENDOSCOPY N/A 07/24/2012    Hiatus Hernia,normal stomach, normal examined duodenum   WISDOM TOOTH EXTRACTION      OB History     Gravida  1   Para  1   Term      Preterm      AB      Living         SAB      IAB      Ectopic      Multiple      Live Births               Home Medications    Prior to Admission medications   Medication Sig Start Date End Date Taking? Authorizing Provider  meclizine  (ANTIVERT ) 25 MG tablet Take 1 tablet (25 mg total) by mouth 3 (three) times daily as needed for dizziness. 02/14/24  Yes Harshini Trent A, PA-C  albuterol  (PROVENTIL ) (2.5 MG/3ML) 0.083% nebulizer solution Take 3 mLs (2.5 mg total) by nebulization every 6 (six) hours as needed for wheezing or shortness of breath. 07/22/23   Katrinka Garnette KIDD, MD  albuterol  (VENTOLIN  HFA) 108 [phone] Base) MCG/ACT inhaler Inhale 2 puffs into the lungs every 6 (six) hours as needed for wheezing or shortness of breath. 07/21/23   Katrinka Garnette KIDD, MD  ascorbic acid  (VITAMIN C) 500 MG tablet Take 500 mg by mouth daily.    [provider]  BREO ELLIPTA  100-25 MCG/ACT AEPB INHALE 1 PUFF INTO THE LUNGS DAILY 02/13/23   Katrinka Garnette KIDD, MD  cholecalciferol  (VITAMIN D3) 25 MCG (1000 UT) tablet Take 1,000 Units by mouth daily.    [provider]  clobetasol cream (TEMOVATE) 0.05 % Apply 1 Application topically 2 (two) times daily. As needed for lichen sclerosus- from dermatology but would be willing to fill as PCP 02/04/22   [provider]  Cyanocobalamin  (VITAMIN B 12) 500 MCG TABS Take 1,000 mcg by mouth daily at 12 noon.    [provider]  diclofenac  (VOLTAREN ) 75 MG EC tablet TAKE 1 TABLET(75 MG) BY MOUTH TWICE DAILY 04/10/23   Katrinka Garnette KIDD, MD  escitalopram  (LEXAPRO ) 20 MG tablet TAKE 1 TABLET(20 MG) BY MOUTH DAILY 07/17/23   Katrinka Garnette KIDD, MD  esomeprazole  (NEXIUM ) 40 MG capsule Take 1 capsule (40 mg total) by mouth 2 (two) times daily. 03/04/23   Katrinka Garnette KIDD, MD  Evolocumab   (REPATHA  SURECLICK) 140 MG/ML SOAJ ADMINISTER 1 ML UNDER THE SKIN EVERY 14 DAYS 05/12/23   Hilty, Vinie BROCKS, MD  ezetimibe  (ZETIA ) 10 MG tablet TAKE 1 TABLET(10 MG) BY MOUTH DAILY 01/08/24   Katrinka Garnette KIDD, MD  fluticasone  (FLONASE ) 50 MCG/ACT nasal spray SHAKE BOTTLE AND USE 1 SPRAY IN  EACH NOSTRIL DAILY 09/19/23   Katrinka Garnette KIDD, MD  losartan  (COZAAR ) 100 MG tablet TAKE 1 TABLET(100 MG) BY MOUTH DAILY 09/11/23   Katrinka Garnette KIDD, MD  montelukast  (SINGULAIR ) 10 MG tablet TAKE 1 TABLET(10 MG) BY MOUTH AT BEDTIME 12/11/23   Katrinka Garnette KIDD, MD  Multiple Vitamin (MULTIVITAMIN WITH MINERALS) TABS tablet Take 1 tablet by mouth daily.    [provider]  predniSONE  (DELTASONE ) 20 MG tablet Take 2 pills for 3 days, 1 pill for 4 days 02/06/24   Katrinka Garnette KIDD, MD    Family History Family History  Problem Relation Age of Onset   Arthritis Mother    Asthma Mother    Diabetes Mother    Cirrhosis Mother        died of liver nonalcoholic cirrhosis   COPD Mother    Heart disease Mother        CHF   Hypertension Mother    Kidney disease Mother    Liver disease Mother    Eating disorder Mother    Obesity Mother    Cancer Father        died of pancreatic cancer at age 18   Alcohol abuse Father    Asthma Sister        doesnt know history well   Drug abuse Sister    Other Sister    Drug abuse Sister        doesnt know history well   Depression Brother    Alcohol abuse Brother    Other Brother        died of MVA   Heart attack Maternal Grandfather 71   Stroke Maternal Aunt        died age 80yo of CVA    Social History Social History   Tobacco Use   Smoking status: Former    Current packs/day: 0.00    Average packs/day: 1 pack/day for 15.0 years (15.0 ttl pk-yrs)    Types: Cigarettes    Start date: 06/24/1976    Quit date: 06/25/1991    Years since quitting: 32.6   Smokeless tobacco: Never  Vaping Use   Vaping status: Never Used  Substance Use Topics   Alcohol use:  Yes    Alcohol/week: 0.0 standard drinks of alcohol    Comment: wine every 6 months or so   Drug use: No     Allergies   Statins, Amoxicillin, and Penicillins   Review of Systems Review of Systems  Constitutional:  Negative for chills and fever.  HENT:  Negative for ear pain and sore throat.   Eyes:  Negative for pain and visual disturbance.  Respiratory:  Positive for cough (Dry cough). Negative for shortness of breath.   Cardiovascular:  Negative for chest pain and palpitations.  Gastrointestinal:  Negative for abdominal pain and vomiting.  Genitourinary:  Negative for dysuria and hematuria.  Musculoskeletal:  Positive for back pain (yesterday day between shoulder blades but now resolved). Negative for arthralgias.  Skin:  Negative for color change and rash.  Neurological:  Positive for dizziness. Negative for seizures, syncope, facial asymmetry, light-headedness and headaches.  All other systems reviewed and are negative.    Physical Exam Triage Vital Signs ED Triage Vitals  Encounter Vitals Group     BP 02/14/24 1707 122/82     Girls Systolic BP Percentile --      Girls Diastolic BP Percentile --      Boys Systolic BP Percentile --  Boys Diastolic BP Percentile --      Pulse Rate 02/14/24 1707 73     Resp 02/14/24 1707 16     Temp 02/14/24 1707 98.1 F (36.7 C)     Temp Source 02/14/24 1707 Oral     SpO2 02/14/24 1707 99 %     Weight --      Height --      Head Circumference --      Peak Flow --      Pain Score 02/14/24 1706 0     Pain Loc --      Pain Education --      Exclude from Growth Chart --    No data found.  Updated Vital Signs BP 122/82 (BP Location: Right Arm)   Pulse 73   Temp 98.1 F (36.7 C) (Oral)   Resp 16   LMP 03/13/2015   SpO2 99%   Visual Acuity Right Eye Distance:   Left Eye Distance:   Bilateral Distance:    Right Eye Near:   Left Eye Near:    Bilateral Near:     Physical Exam Vitals and nursing note reviewed.   Constitutional:      General: She is not in acute distress.    Appearance: She is well-developed.  HENT:     Head: Normocephalic and atraumatic.     Right Ear: Tympanic membrane normal.     Left Ear: Tympanic membrane normal.     Nose: Nose normal.     Mouth/Throat:     Mouth: Mucous membranes are moist.  Eyes:     Conjunctiva/sclera: Conjunctivae normal.  Cardiovascular:     Rate and Rhythm: Normal rate and regular rhythm.     Heart sounds: No murmur heard. Pulmonary:     Effort: Pulmonary effort is normal. No respiratory distress.     Breath sounds: Normal breath sounds.  Abdominal:     Palpations: Abdomen is soft.     Tenderness: There is no abdominal tenderness.  Musculoskeletal:        General: No swelling.     Cervical back: Neck supple.  Skin:    General: Skin is warm and dry.     Capillary Refill: Capillary refill takes less than 2 seconds.  Neurological:     General: No focal deficit present.     Mental Status: She is alert and oriented to person, place, and time.     Cranial Nerves: No cranial nerve deficit.     Coordination: Coordination normal.     Gait: Gait normal.  Psychiatric:        Mood and Affect: Mood normal.        Behavior: Behavior normal.        Thought Content: Thought content normal.        Judgment: Judgment normal.      UC Treatments / Results  Labs (all labs ordered are listed, but only abnormal results are displayed) Labs Reviewed  COMPREHENSIVE METABOLIC PANEL WITH GFR  CBC  POCT FASTING CBG [facility] MANUAL ENTRY    EKG   Radiology DG Chest 2 View Result Date: 02/14/2024 CLINICAL DATA:  Cough. Pain between the shoulder blade starting yesterday. Dizziness today. EXAM: CHEST - 2 VIEW COMPARISON:  05/07/2023 FINDINGS: The heart size and mediastinal contours are within normal limits. Both lungs are clear. The visualized skeletal structures are unremarkable. IMPRESSION: No active cardiopulmonary disease. Electronically Signed   By:  Elsie Gravely M.D.   On: 02/14/2024 18:19  Procedures Procedures (including critical care time)  Medications Ordered in UC Medications - No data to display  Initial Impression / Assessment and Plan / UC Course  I have reviewed the triage vital signs and the nursing notes.  Pertinent labs & imaging results that were available during my care of the patient were reviewed by me and considered in my medical decision making (see chart for details).     Acute cough - Plan: DG Chest 2 View, DG Chest 2 View  Dizziness - Plan: POC CBG monitoring, POC CBG monitoring  Acute midline thoracic back pain  Vertigo   EKG done today and shows normal sinus rhythm.  Chest x-ray done today.  Brief evaluation shows no acute process.  Final evaluation by the radiologist is still pending if there is any changes we will contact you.  Symptoms of dizziness likely secondary to vertigo especially given the history of vertigo.  Vital signs and physical exam findings are reassuring.  We are also getting blood work today including complete blood count, complete metabolic panel.  This will check for anemia, issues with the kidney, issues with the liver and electrolytes.  Blood sugar today was 87.  At this time we will try meclizine  to help with the dizziness.  It is also reassuring that the symptoms are improving.  If symptoms worsen then recommend going to the emergency room for further evaluation.  Keep follow-up appointment with primary care on Monday. Meclizine  25 mg 3 times daily as needed for dizziness.   Final Clinical Impressions(s) / UC Diagnoses   Final diagnoses:  Acute cough  Dizziness  Acute midline thoracic back pain  Vertigo     Discharge Instructions      EKG done today and shows normal sinus rhythm.  Chest x-ray done today.  Brief evaluation shows no acute process.  Final evaluation by the radiologist is still pending if there is any changes we will contact you.  Symptoms of dizziness  likely secondary to vertigo especially given the history of vertigo.  Vital signs and physical exam findings are reassuring.  We are also getting blood work today including complete blood count, complete metabolic panel.  This will check for anemia, issues with the kidney, issues with the liver and electrolytes.  Blood sugar today was 87.  At this time we will try meclizine  to help with the dizziness.  It is also reassuring that the symptoms are improving.  If symptoms worsen then recommend going to the emergency room for further evaluation.  Keep follow-up appointment with primary care on Monday. Meclizine  25 mg 3 times daily as needed for dizziness.     ED Prescriptions     Medication Sig Dispense Auth. Provider   meclizine  (ANTIVERT ) 25 MG tablet Take 1 tablet (25 mg total) by mouth 3 (three) times daily as needed for dizziness. 30 tablet Teresa Almarie LABOR, [place]      PDMP not reviewed this encounter.   Teresa Almarie LABOR, [place] 02/14/24 1825

## 2024-02-14 NOTE — ED Triage Notes (Addendum)
 Patient c/o pain between her shoulder blades that started yesterday. Patient states no back pain today. Today, the patient reports that she has been having dizziness today. Patient reports that she was seen on 02/06/24 and was diagnosed with sinusitis and asthma exacerbation. Patient states she continues to have a cough from 2 weeks ago.  Patient states she called her PCP today and ws told to come for an x-ray.  Patient has not had anything for her back pain.

## 2024-02-15 ENCOUNTER — Ambulatory Visit (HOSPITAL_COMMUNITY): Payer: Self-pay | Admitting: Internal Medicine

## 2024-02-16 ENCOUNTER — Ambulatory Visit: Admitting: Family Medicine

## 2024-02-16 ENCOUNTER — Telehealth: Payer: Self-pay | Admitting: Family Medicine

## 2024-02-16 ENCOUNTER — Encounter: Payer: Self-pay | Admitting: Family Medicine

## 2024-02-16 VITALS — BP 112/78 | HR 81 | Temp 99.7°F | Ht 61.0 in | Wt 188.8 lb

## 2024-02-16 DIAGNOSIS — I10 Essential (primary) hypertension: Secondary | ICD-10-CM | POA: Diagnosis not present

## 2024-02-16 DIAGNOSIS — Z131 Encounter for screening for diabetes mellitus: Secondary | ICD-10-CM

## 2024-02-16 DIAGNOSIS — J4541 Moderate persistent asthma with (acute) exacerbation: Secondary | ICD-10-CM | POA: Diagnosis not present

## 2024-02-16 DIAGNOSIS — R7989 Other specified abnormal findings of blood chemistry: Secondary | ICD-10-CM

## 2024-02-16 MED ORDER — PREDNISONE 20 MG PO TABS: ORAL_TABLET | ORAL | 0 refills | Status: DC

## 2024-02-16 NOTE — Telephone Encounter (Signed)
 Pt was seen in [facility] on 02/14/24.  Patient Name First: Shannon Last: Mckenzie Gender: Female DOB: Oct 07, 1960 Age: 63 Y 8 M 2 D Return Phone Number: [phone] (Primary) Address: City/ State/ Zip: [place] [address] Client [facility] - Human resources officer [facility] [employer] Provider Katrinka Senior- MD Contact Type Call Who Is Calling Patient / Member / Family / Caregiver Call Type Triage / Clinical Relationship To Patient Self Return Phone Number [phone] (Primary) Chief Complaint Dizziness Reason for Call Symptomatic / Request for Health Information Initial Comment Caller states that she saw Dr. Katrinka last week and was prescribed antibiotics and steroids for a cough. Yesterday felt a sharp pain between back and shoulder blades would feel dizzy/nauseous and then sx would go away. This morning was dizzy and unable to get up. Moving around but at a slow pace. Translation No Nurse Assessment Nurse: Georgina, RN, Arlyne Date/Time (Eastern Time): 02/14/2024 12:49:16 PM Confirm and document reason for call. If symptomatic, describe symptoms. ---Caller stated she was seen last week by Dr. Katrinka and was dx with acute bacterial sinus infection with asthma flare up and was given an antibiotic and steroids. She stated that yesterday she started having pain between her shoulder blades and felt dizziness and nausea and happed several times yesterday. She stated this morning she was having severe dizziness and had to get up very slowly. She is now able to walk without assistance but is still having lightheadedness. She stated she completed the antibiotic on Thursday and the steroids. She stated the sinus infection has improved but the dizziness is new and concerning. Does the patient have any new or worsening symptoms? ---Yes Will a triage be completed? ---Yes Related visit to physician within the last 2 weeks?  ---Yes Does the PT have any chronic conditions? (i.e. diabetes, asthma, this includes High risk factors for pregnancy, etc.) ---Yes List chronic conditions. ---Asthma Nurse Assessment Is this a behavioral health or substance abuse call? ---No Guidelines Guideline Title Affirmed Question Affirmed Notes Nurse Date/Time (Eastern Time) Sinus Infection on Antibiotic Follow-up Call [1] Reasonable improvement on antibiotic AND [2] no fever or pain Georgina, RN, Arlyne 02/14/2024 12:56:33 PM Dizziness - Lightheadedness [1] MODERATE dizziness (e.g., interferes with normal activities) AND [2] has NOT been evaluated by doctor (or NP/PA) for this (Exception: Dizziness caused by heat exposure, sudden standing, or poor fluid intake.) Georgina, RN, Arlyne 02/14/2024 1:00:44 PM Disp. Time Titus Time) Disposition Final User 02/14/2024 12:31:54 PM Attempt made - message left Georgina OBIE Arlyne 02/14/2024 12:58:44 PM Home Care Georgina, RN, Arlyne 02/14/2024 1:08:28 PM See PCP within 24 Hours Yes Georgina, RN, Arlyne Final Disposition 02/14/2024 1:08:28 PM See PCP within 24 Hours Yes Georgina, RN, Arlyne [place] Disagree/Comply Comply Caller Understands Yes PreDisposition InappropriateToAsk Care Advice Given Per Guideline HOME CARE: * You should be able to treat this at home. BACTERIAL SINUSITIS: * SYMPTOMS: The most common symptoms are sinus pain, congestion, fever, and nasal discharge. * CAUSE: Sinusitis usually occurs after a viral upper respiratory infection such as a cold. Approximately 1 to 2 % of colds in adults turn in to a bacterial sinusitis. CONTINUE ANTIBIOTIC: * Continue the prescribed antibiotic. CALL BACK IF: * You become worse SEE PCP WITHIN 24 HOURS: * IF OFFICE WILL BE CLOSED: You need to be seen within the next 24 hours. A clinic or an urgent care center is often a good source of care if your doctor's office is closed or  you can't get an appointment. DRINK  FLUIDS: * Drink several glasses of fruit juice, other clear fluids or water. * This will improve hydration and blood sugar levels. * If the weather Care Advice Given Per Guideline is hot or you have a fever, make sure the fluids are cold. LIE DOWN AND REST: * Lie down with feet elevated for 1 hour. * This will improve blood flow through the body and to the brain. * You pass out (faint) or are too weak to stand CALL BACK IF: * You become worse Referrals [facility] - UC

## 2024-02-16 NOTE — Telephone Encounter (Signed)
Would you like to see the patient.

## 2024-02-16 NOTE — Progress Notes (Signed)
 Phone [phone] In person visit   Subjective:   Shannon Mckenzie is a 63 y.o. year old very pleasant female patient who presents for/with See problem oriented charting Chief Complaint  Patient presents with   Dizziness   Medical Management of Chronic Issues    Sharp pain between shoulder blades and nauseous Friday morning;    Discussed the use of AI scribe software for clinical note transcription with the patient, who gave verbal consent to proceed.  History of Present Illness   Shannon Mckenzie Shannon Mckenzie is a 63 year old female with asthma who presents with persistent cough and dizziness.  She had been experiencing persistent respiratory symptoms for the  two weeks prior to our last visit earlier in august, initially presenting with green sputum production, chest congestion, rib pain, nasal discharge, and wheezing. She was treated with doxycycline  for suspected bacterial sinusitis and a seven-day course of prednisone  for asthma exacerbation. she had  initial improvement, her symptoms changed over the weekend, with increased dizziness on saturday and back pain on friday that concerned her. cough and chest congestion also seemed to bother her. Her nasal discharge has dried up, but she continues to have an upper throat cough with sputum that is green or foamy white. She notes that her cough worsens after eating.  On Friday, she experienced pain between her shoulder blades and nausea while shopping. The following morning, she woke up with severe dizziness, described as 'room spinning,' which was exacerbated by head movements. She has a history of vertigo, which occurred a long time ago. The dizziness improved with sitting still and was not associated with blurry vision, headaches, or limb weakness.  She had no tenderness or hearing loss.  Episodes were brief as long as she stopped her head movement  She went to urgent care on 02/14/24 after calling in and being informed back pain could be  related to heart.  A chest x-ray showed no active cardiopulmonary disease, and a CBC was normal. However, she was found to be dehydrated based on her CMP results and also had slightly low protein.  Her current medications include Breo 100/25 mcg daily, escitalopram  20 mg daily, Nexium  twice daily, Repatha , Zetia  10 mg, losartan  100 mg, and Singulair . She uses her albuterol  inhaler once daily, which provides some relief. She has reduced her diclofenac  use as had concerns about liver function (misread urgent care note stating checking cmp to assess liver function)  She has been taking meclizine  25 mg for vertigo, which has been helpful.     Her blood pressure has remained controlled during this with losartan  100 mg  Past Medical History-  Patient Active Problem List   Diagnosis Date Noted   Major depression in full remission (HCC) 05/08/2017    Priority: High   Lichen sclerosus 02/07/2022    Priority: Medium    Statin intolerance 01/07/2022    Priority: Medium    Status post Nissen fundoplication 10/01/2019    Priority: Medium    Murmur 07/16/2018    Priority: Medium    Hiatal hernia with GERD without esophagitis 01/10/2018    Priority: Medium    Carpal tunnel syndrome 05/26/2017    Priority: Medium    Former smoker 05/26/2017    Priority: Medium    GAD (generalized anxiety disorder) 05/08/2017    Priority: Medium    Osteoarthritis 03/13/2015    Priority: Medium    Anemia 04/16/2013    Priority: Medium    Extrinsic asthma, mild persistent, uncomplicated  03/20/2013    Priority: Medium    Upper airway cough syndrome ? all related to sinus dz 03/19/2013    Priority: Medium    Essential hypertension, benign 04/15/2012    Priority: Medium    Hyperlipidemia 04/15/2012    Priority: Medium    Rectal pain 10/10/2020    Priority: Low   Urinary incontinence 11/30/2019    Priority: Low   Allergic rhinitis 05/26/2017    Priority: Low   Iron deficiency 02/20/2016    Priority: Low    Cough 03/13/2015    Priority: Low   Back pain 11/10/2014    Priority: Low   Rash and nonspecific skin eruption 07/21/2014    Priority: Low   Cold feeling 05/03/2014    Priority: Low   Sinusitis, chronic 06/14/2013    Priority: Low   Obesity 04/15/2012    Priority: Low    Medications- reviewed and updated Current Outpatient Medications  Medication Sig Dispense Refill   albuterol  (PROVENTIL ) (2.5 MG/3ML) 0.083% nebulizer solution Take 3 mLs (2.5 mg total) by nebulization every 6 (six) hours as needed for wheezing or shortness of breath. 150 mL 1   albuterol  (VENTOLIN  HFA) 108 (90 Base) MCG/ACT inhaler Inhale 2 puffs into the lungs every 6 (six) hours as needed for wheezing or shortness of breath. 1 each 2   ascorbic acid  (VITAMIN C) 500 MG tablet Take 500 mg by mouth daily.     BREO ELLIPTA  100-25 MCG/ACT AEPB INHALE 1 PUFF INTO THE LUNGS DAILY 180 each 3   cholecalciferol  (VITAMIN D3) 25 MCG (1000 UT) tablet Take 1,000 Units by mouth daily.     clobetasol cream (TEMOVATE) 0.05 % Apply 1 Application topically 2 (two) times daily. As needed for lichen sclerosus- from dermatology but would be willing to fill as PCP     Cyanocobalamin  (VITAMIN B 12) 500 MCG TABS Take 1,000 mcg by mouth daily at 12 noon.     diclofenac  (VOLTAREN ) 75 MG EC tablet TAKE 1 TABLET(75 MG) BY MOUTH TWICE DAILY 180 tablet 1   escitalopram  (LEXAPRO ) 20 MG tablet TAKE 1 TABLET(20 MG) BY MOUTH DAILY 90 tablet 3   esomeprazole  (NEXIUM ) 40 MG capsule Take 1 capsule (40 mg total) by mouth 2 (two) times daily. 180 capsule 3   Evolocumab  (REPATHA  SURECLICK) 140 MG/ML SOAJ ADMINISTER 1 ML UNDER THE SKIN EVERY 14 DAYS 6 mL 3   ezetimibe  (ZETIA ) 10 MG tablet TAKE 1 TABLET(10 MG) BY MOUTH DAILY 90 tablet 3   fluticasone  (FLONASE ) 50 MCG/ACT nasal spray SHAKE BOTTLE AND USE 1 SPRAY IN EACH NOSTRIL DAILY 16 g 3   losartan  (COZAAR ) 100 MG tablet TAKE 1 TABLET(100 MG) BY MOUTH DAILY 90 tablet 3   meclizine  (ANTIVERT ) 25 MG tablet  Take 1 tablet (25 mg total) by mouth 3 (three) times daily as needed for dizziness. 30 tablet 0   montelukast  (SINGULAIR ) 10 MG tablet TAKE 1 TABLET(10 MG) BY MOUTH AT BEDTIME 90 tablet 3   Multiple Vitamin (MULTIVITAMIN WITH MINERALS) TABS tablet Take 1 tablet by mouth daily.     predniSONE  (DELTASONE ) 20 MG tablet Take 2 pills for 3 days, 1 pill for 4 days 10 tablet 0   No current facility-administered medications for this visit.     Objective:  BP 112/78 (BP Location: Left Arm, Patient Position: Sitting, Cuff Size: Normal)   Pulse 81   Temp 99.7 F (37.6 C) (Temporal)   Ht 5' 1 (1.549 m)   Wt 188 lb 12.8  oz (85.6 kg)   LMP 03/13/2015   SpO2 96%   BMI 35.67 kg/m  Gen: NAD, resting comfortably No sinus tenderness noted. CV: RRR no murmurs rubs or gallops Lungs: CTAB no crackles, wheeze, rhonchi Ext: Minimal edema Skin: warm, dry MSK: No midline back pain and upper back-reports some relief of tension with palpation of paraspinous muscles    Assessment and Plan      Asthma with persistent symptoms still with exacerbation Persistent asthma symptoms with upper chest congestion and mild shortness of breath. Previous treatment with prednisone  showed some improvement along with doxycycline  for sinusitis improving prior sinus pressure and green drainage on 02/06/24. No recent wheezing, but uses albuterol  inhaler daily with some relief of cough and congestion. Differential includes lingering asthma irritation, postviral cough, pneumonia ruled out with x-ray. - Prescribe another 7-day course of prednisone . - Continue Breo and Singulair  as per current regimen. - Use albuterol  inhaler as needed, approximately once a day.  Gastroesophageal reflux disease (GERD) GERD managed with esomeprazole  twice daily. Symptoms may contribute to cough, but aspiration pneumonia ruled out by chest x-ray. - Continue esomeprazole  twice daily.  Dehydration Dehydration likely secondary to recent illness and  nausea. Lab results show elevated BUN and creatinine ratio indicating dehydration. - Encourage increased hydration. -Her protein was also slightly low and we will recheck that with CMP today  Vertigo, recurrent, improving-likely due to BPPV with no red flags and seems to be improving Recurrent vertigo with positional triggers, improving with meclizine . No need for vestibular therapy at this time as symptoms are improving. - Continue meclizine  25 mg as needed. - Monitor symptoms and consider vestibular therapy if symptoms do not improve. -With improvement as well as history of BPPV we did not perform Dix-Hallpike   Musculoskeletal back pain Back pain possibly musculoskeletal in origin, related to work of breathing and coughing. Resolved at present.  No midline pain and she reported massage actually helpful-encouraged her to pursue this     Hypertension-well-controlled with losartan  100 mg-continue current medication-if persistent cough issues may consider changing as some other ARB's have even lower association with cough   Recommended follow up: Return for next already scheduled visit or sooner if needed. Future Appointments  Date Time Provider Department Center  03/26/2024 10:00 AM Katrinka Garnette KIDD, MD [facility]    Lab/Order associations:   ICD-10-CM   1. Asthma in adult, moderate persistent, with acute exacerbation  J45.41     2. Essential hypertension, benign  I10     3. Screening for diabetes mellitus  Z13.1 HgB A1c    HgB A1c    4. Elevated serum creatinine  R79.89 Comp Met (CMET)    Comp Met (CMET)      Meds ordered this encounter  Medications   predniSONE  (DELTASONE ) 20 MG tablet    Sig: Take 2 pills for 3 days, 1 pill for 4 days    Dispense:  10 tablet    Refill:  0    Return precautions advised.  Garnette Katrinka, MD

## 2024-02-16 NOTE — Telephone Encounter (Signed)
 We are working her in this afternoon

## 2024-02-16 NOTE — Patient Instructions (Addendum)
 YOUR PLAN:  ASTHMA WITH PERSISTENT SYMPTOMS: You have ongoing asthma symptoms with chest congestion and mild shortness of breath. Your previous treatment with prednisone  helped somewhat. -We are prescribing another 7-day course of prednisone . -Continue taking Breo and Singulair  as you have been. -Use your albuterol  inhaler as needed up to every 4-6 hours -I still want to know if you are not significantly better/continuing to improve in 7-10 days or sooner if worsens  GASTROESOPHAGEAL REFLUX DISEASE (GERD): Your GERD is being managed with esomeprazole , and it may be contributing to your cough. -Continue taking esomeprazole  twice daily.  DEHYDRATION: You are dehydrated, likely due to your recent illness and nausea. -Increase your fluid intake to stay hydrated.  VERTIGO, RECURRENT, IMPROVING: You have recurrent vertigo that is improving with meclizine . -Continue taking meclizine  25 mg as needed. -Monitor your symptoms and consider vestibular therapy if they do not improve.  MUSCULOSKELETAL BACK PAIN: Your back pain is likely related to your coughing and has resolved for now. -No current treatment needed as the pain has resolved. -EKG reassuring which does not rule out heart but makes far less likely  Recommended follow up: Return for next already scheduled visit or sooner if needed.

## 2024-02-16 NOTE — Telephone Encounter (Signed)
 Message already sent to provider on a different encounter. Will call the patient and advise I am waiting to hear back from Dr. Katrinka.    Copied from CRM #[id number]. Topic: Appointments - Scheduling Inquiry for Clinic >> Feb 16, 2024  9:38 AM Mesmerise C wrote: Reason for CRM: Patient would like to be as seen as soon as possible regarding her Urgent Care visit over the weekend and would like to discuss what's going on or a phone call to talk with provider patient would like a call back at [phone]

## 2024-02-17 ENCOUNTER — Ambulatory Visit: Payer: Self-pay | Admitting: Family Medicine

## 2024-02-17 LAB — COMPREHENSIVE METABOLIC PANEL WITH GFR
ALT: 14 U/L (ref 0–35)
AST: 14 U/L (ref 0–37)
Albumin: 3.8 g/dL (ref 3.5–5.2)
Alkaline Phosphatase: 70 U/L (ref 39–117)
BUN: 23 mg/dL (ref 6–23)
CO2: 27 meq/L (ref 19–32)
Calcium: 8.5 mg/dL (ref 8.4–10.5)
Chloride: 107 meq/L (ref 96–112)
Creatinine, Ser: 0.92 mg/dL (ref 0.40–1.20)
GFR: 67.12 mL/min (ref >60.00)
Glucose, Bld: 70 mg/dL (ref 70–99)
Potassium: 4 meq/L (ref 3.5–5.1)
Sodium: 143 meq/L (ref 135–145)
Total Bilirubin: 0.4 mg/dL (ref 0.2–1.2)
Total Protein: 6.1 g/dL (ref 6.0–8.3)

## 2024-02-17 LAB — HEMOGLOBIN A1C: Hgb A1c MFr Bld: 5.7 % (ref 4.6–6.5)

## 2024-02-24 ENCOUNTER — Other Ambulatory Visit: Payer: Self-pay | Admitting: Family Medicine

## 2024-03-04 ENCOUNTER — Other Ambulatory Visit: Payer: Self-pay | Admitting: Family Medicine

## 2024-03-05 ENCOUNTER — Other Ambulatory Visit: Payer: Self-pay | Admitting: Family Medicine

## 2024-03-26 ENCOUNTER — Ambulatory Visit (INDEPENDENT_AMBULATORY_CARE_PROVIDER_SITE_OTHER): Admitting: Family Medicine

## 2024-03-26 ENCOUNTER — Encounter: Payer: Self-pay | Admitting: Family Medicine

## 2024-03-26 VITALS — BP 138/88 | HR 78 | Temp 97.7°F | Ht 61.0 in | Wt 188.4 lb

## 2024-03-26 DIAGNOSIS — Z0000 Encounter for general adult medical examination without abnormal findings: Secondary | ICD-10-CM

## 2024-03-26 DIAGNOSIS — Z23 Encounter for immunization: Secondary | ICD-10-CM | POA: Diagnosis not present

## 2024-03-26 NOTE — Addendum Note (Signed)
 Addended by: Trenton Passow on: 03/26/2024 11:00 AM   Modules accepted: Orders

## 2024-03-26 NOTE — Progress Notes (Signed)
 Phone [phone]   Subjective:  Patient presents today for their annual physical. Chief complaint-noted.   See problem oriented charting- ROS- full  review of systems was completed and negative except for topics noted under acute/chronic concerns   The following were reviewed and entered/updated in epic: Past Medical History:  Diagnosis Date   Allergy     Anemia    Anxiety    Aspiration pneumonia (HCC) 09/10/2019   Asthma    Depression    DJD (degenerative joint disease) of hip    Former smoker    GERD (gastroesophageal reflux disease)    Hand pain, right    Heart murmur    Hiatal hernia    History of kidney stones    history   Hyperlipidemia    Hypertension    Kidney stone    Osteoarthritis    Pain, hip    right   Pneumonia    hx but no hospitalization   Reactive airway disease    RLS (restless legs syndrome)    Routine gynecological examination    sees gynecology, [name] NP   SOB (shortness of breath) 9/14   cardiac eval with stress test and echo; Dr. Jacques Somerset   Statin intolerance    Vision problems    wears contact lenses   Patient Active Problem List   Diagnosis Date Noted   Major depression in full remission 05/08/2017    Priority: High   Lichen sclerosus 02/07/2022    Priority: Medium    Statin intolerance 01/07/2022    Priority: Medium    Status post Nissen fundoplication 10/01/2019    Priority: Medium    Murmur 07/16/2018    Priority: Medium    Hiatal hernia with GERD without esophagitis 01/10/2018    Priority: Medium    Carpal tunnel syndrome 05/26/2017    Priority: Medium    Former smoker 05/26/2017    Priority: Medium    GAD (generalized anxiety disorder) 05/08/2017    Priority: Medium    Osteoarthritis 03/13/2015    Priority: Medium    Anemia 04/16/2013    Priority: Medium    Extrinsic asthma, mild persistent, uncomplicated 03/20/2013    Priority: Medium    Upper airway cough syndrome ? all related to sinus dz 03/19/2013     Priority: Medium    Essential hypertension, benign 04/15/2012    Priority: Medium    Hyperlipidemia 04/15/2012    Priority: Medium    Rectal pain 10/10/2020    Priority: Low   Urinary incontinence 11/30/2019    Priority: Low   Allergic rhinitis 05/26/2017    Priority: Low   Iron deficiency 02/20/2016    Priority: Low   Cough 03/13/2015    Priority: Low   Back pain 11/10/2014    Priority: Low   Rash and nonspecific skin eruption 07/21/2014    Priority: Low   Cold feeling 05/03/2014    Priority: Low   Sinusitis, chronic 06/14/2013    Priority: Low   Obesity 04/15/2012    Priority: Low   Past Surgical History:  Procedure Laterality Date   bilateral hip replacement     nov 21st 2019 right, Jun 09 2018 left    CARPAL TUNNEL RELEASE Left 07/03/2017   Procedure: LEFT CARPAL TUNNEL RELEASE;  Surgeon: Murrell Drivers, MD;  Location: [facility];  Service: Orthopedics;  Laterality: Left;   COLONOSCOPY  06/2012   Dr. Dallas; repeat 10 years   HIATAL HERNIA REPAIR N/A 10/01/2019   Procedure: LAPAROSCOPIC  REPAIR OF HIATAL HERNIA WITH NISSEN FUNDOPLICATION, UPPER ENDO, REMOVAL OF GASTRIC MASS;  Surgeon: Gladis Cough, MD;  Location: [facility];  Service: General;  Laterality: N/A;   UPPER GI ENDOSCOPY N/A 07/24/2012   Hiatus Hernia,normal stomach, normal examined duodenum   WISDOM TOOTH EXTRACTION      Family History  Problem Relation Age of Onset   Arthritis Mother    Asthma Mother    Diabetes Mother    Cirrhosis Mother        died of liver nonalcoholic cirrhosis   COPD Mother    Heart disease Mother        CHF   Hypertension Mother    Kidney disease Mother    Liver disease Mother    Eating disorder Mother    Obesity Mother    Cancer Father        died of pancreatic cancer at age 20   Alcohol abuse Father    Asthma Sister        doesnt know history well   Drug abuse Sister    Other Sister    Drug abuse Sister        doesnt know history well   Depression  Brother    Alcohol abuse Brother    Other Brother        died of MVA   Heart attack Maternal Grandfather 30   Stroke Maternal Aunt        died age 54yo of CVA    Medications- reviewed and updated Current Outpatient Medications  Medication Sig Dispense Refill   albuterol  (PROVENTIL ) (2.5 MG/3ML) 0.083% nebulizer solution Take 3 mLs (2.5 mg total) by nebulization every 6 (six) hours as needed for wheezing or shortness of breath. 150 mL 1   albuterol  (VENTOLIN  HFA) 108 (90 Base) MCG/ACT inhaler Inhale 2 puffs into the lungs every 6 (six) hours as needed for wheezing or shortness of breath. 1 each 2   ascorbic acid  (VITAMIN C) 500 MG tablet Take 500 mg by mouth daily.     BREO ELLIPTA  100-25 MCG/ACT AEPB INHALE 1 PUFF INTO THE LUNGS DAILY 180 each 3   cholecalciferol  (VITAMIN D3) 25 MCG (1000 UT) tablet Take 1,000 Units by mouth daily.     clobetasol cream (TEMOVATE) 0.05 % Apply 1 Application topically 2 (two) times daily. As needed for lichen sclerosus- from dermatology but would be willing to fill as PCP     Cyanocobalamin  (VITAMIN B 12) 500 MCG TABS Take 1,000 mcg by mouth daily at 12 noon.     diclofenac  (VOLTAREN ) 75 MG EC tablet TAKE 1 TABLET(75 MG) BY MOUTH TWICE DAILY 180 tablet 1   escitalopram  (LEXAPRO ) 20 MG tablet TAKE 1 TABLET(20 MG) BY MOUTH DAILY 90 tablet 3   esomeprazole  (NEXIUM ) 40 MG capsule TAKE 1 CAPSULE(40 MG) BY MOUTH TWICE DAILY 180 capsule 3   Evolocumab  (REPATHA  SURECLICK) 140 MG/ML SOAJ ADMINISTER 1 ML UNDER THE SKIN EVERY 14 DAYS 6 mL 3   ezetimibe  (ZETIA ) 10 MG tablet TAKE 1 TABLET(10 MG) BY MOUTH DAILY 90 tablet 3   fluticasone  (FLONASE ) 50 MCG/ACT nasal spray SHAKE BOTTLE AND USE 1 SPRAY IN EACH NOSTRIL DAILY 16 g 3   losartan  (COZAAR ) 100 MG tablet TAKE 1 TABLET(100 MG) BY MOUTH DAILY 90 tablet 3   meclizine  (ANTIVERT ) 25 MG tablet Take 1 tablet (25 mg total) by mouth 3 (three) times daily as needed for dizziness. 30 tablet 0   montelukast  (SINGULAIR ) 10 MG  tablet TAKE  1 TABLET(10 MG) BY MOUTH AT BEDTIME 90 tablet 3   Multiple Vitamin (MULTIVITAMIN WITH MINERALS) TABS tablet Take 1 tablet by mouth daily.     No current facility-administered medications for this visit.    Allergies-reviewed and updated Allergies  Allergen Reactions   Statins     Myalgias with all statins   Amoxicillin Hives and Rash   Penicillins Hives, Other (See Comments) and Rash    Social History   Social History Narrative   From: [place]   Living: with Husband. 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       Enjoys: outdoors activity, spending time [place] peaceful   Objective  Objective:  BP 138/88   Pulse 78   Temp 97.7 F (36.5 C) (Temporal)   Ht 5' 1 (1.549 m)   Wt 188 lb 6.4 oz (85.5 kg)   LMP 03/13/2015   SpO2 100%   BMI 35.60 kg/m  Gen: NAD, resting comfortably HEENT: Mucous membranes are moist. Oropharynx normal Neck: no thyromegaly CV: RRR no murmurs rubs or gallops Lungs: CTAB no crackles, wheeze, rhonchi Abdomen: soft/nontender/nondistended/normal bowel sounds. No rebound or guarding.  Ext: no edema Skin: warm, dry Neuro: grossly normal, moves all extremities, PERRLA   Assessment and Plan   63 y.o. female presenting for annual physical.  Health Maintenance counseling: 1. Anticipatory guidance: Patient counseled regarding regular dental exams -q6 months, eye exams - yearly,  avoiding smoking and second hand smoke , limiting alcohol to 1 beverage per day- doesn't drink , no illicit drugs .   2. Risk factor reduction:  Advised patient of need for regular exercise and diet rich and fruits and vegetables to reduce risk of heart attack and stroke.  Exercise- limited lately- feels could increase- enjoys being outdoors and could walkin a park for  instance.  Diet/weight management-down 11 lbs from last physical- unfortunately sounds like stress may be driving some of this with lower appetite. In general also has tried to cut back. With prediabetes is going of the really focus on exercise and may help anxiety as well Wt Readings from Last 3 Encounters:  03/26/24 188 lb 6.4 oz (85.5 kg)  02/16/24 188 lb 12.8 oz (85.6 kg)  02/06/24 188 lb 12.8 oz (85.6 kg)  3. Immunizations/screenings/ancillary studies- flu shot today, opts out COVID, Prevnar 20 today as well Immunization History  Administered Date(s) Administered   Influenza Split 07/06/2011, 03/20/2012   Influenza Whole 04/02/2001, 04/23/2002   Influenza, Seasonal, Injecte, Preservative Fre 04/25/2019   Influenza,inj,Quad PF,6+ Mos 03/19/2013, 02/23/2014   Influenza-Unspecified 03/24/2017, 02/22/2018, 03/24/2021, 03/03/2023   Janssen (J&J) SARS-COV-2 Vaccination 09/07/2019   Moderna Sars-Covid-2 Vaccination 06/16/2020   Pneumococcal Polysaccharide-23 03/20/2012   Td 02/16/2005   Tdap 07/06/2014   Zoster Recombinant(Shingrix ) 07/16/2018, 11/09/2018  4. Cervical cancer screening- 05/17/2020 with 5 year repeat with GYN with HPV negative- sees Annabelle Galt NP with [facility] 5. Breast cancer screening-  breast exam with gyn and mammogram 10/01/22- had another one this year she reports 6. Colon cancer screening - 07/13/2019 with 5 year repeat- she should be contacted in january 7. Skin cancer screening- sees dermatology yearly at least with lichen sclerosis if not more. advised regular sunscreen use. Denies worrisome, changing, or new skin lesions.  8. Birth control/STD check- postmenopausal/only active  with husband 9. Osteoporosis screening at 66- 02/25/23 with normal bone density 10. Smoking associated screening - former smoker quit in 1990s with 15 pack years- no regular screening  Status of chronic or acute concerns    #Hypertension S: medication:  losartan  100 mg  BP Readings from Last 3  Encounters:  03/26/24 138/88  02/16/24 112/78  02/14/24 122/82  A/P: blood pressure is up today to high acceptable range but her stressors are much higher. Id like for her to monitor at home and we can consider increasing dose if over 140/90 on average     # Coronary artery calcium  (negative LP(a))  #Hyperlipidemia-working with lipid clinic S: Patient compliant with Zetia  10 mg daily and Repatha  140 mg every 2 weeks (will be through us ) - intolerant to even once a week rosuvastatin  and multiple other statins. LDL goal closer to 100 but has myalgias -no chest pain or shortness of breath since getting over illness- other than if really pushes herself gets winded like hills- plans to walk more but if worsens will see us  back Lab Results  Component Value Date   CHOL 150 02/10/2023   HDL 59.00 02/10/2023   LDLCALC 76 02/10/2023   LDLDIRECT 171.0 01/09/2018   TRIG 76.0 02/10/2023   CHOLHDL 3 02/10/2023  A/P: lipids just above ideal goal- update today Coronary artery calcium  noted- want LDL controlled to reduce long term risks     #Asthma S: Patient remains on Singulair  and Breo 100-25 mcg 1 puff daily .  Infrequent albuterol  use. -Did have significant flare in August-required prednisone , with doxycycline  for sinusitis and later repeat prednisone  course-reports today symptom(s) improved from last visit A/P: asthma improved- continue current medications      #Osteoarthritis-back pain plus s/p bilateral hip replacements- doing much beter.  #Rheumatoid arthritis- listed 2018 by [facility] S: Has seen physical therapy and rheumatology in the past-rheumatology told her she could be released to me.  -Likes to have diclofenac  on hand-aware of risks- will have kidney function checked through husbands work soon . She's trying to take less - right low back pain recent months plans to see ortho A/P: osteoarthritis ongoing- plans to schedule follow up with orthopedic for back and we will  have her drop off copy of labs from husbands work next month      #GERD- controlled with omeprazole and has upcoming gastroenterology visit   #Anemia S: Patient with history of EGD and colonoscopy back in 2016 for iron deficiency anemia.  She has been on iron through [facility] since 2014 and CBC is largely stable.  A/P: gts updated labs soon- she will send us  copy     #Depression/GAD S: Controlled on Lexapro  20 mg. Sometimes even with this doesn't want to get out of bed in morning. If does get up and stays busy cellulitis and abscess do well. Stress with husband. They sleep in different bedrooms after prior separation -not helpful  buspirone  7.5 mg BID for anxiety element - Seeing counselor  in past - not needed 2024- and not interested at this point - working 1 day a week- could engage in community more- would be interested in visiting the elderly for instance and enjoying church    03/26/2024   10:04 AM 02/16/2024    4:26 PM 02/06/2024   11:35 AM  Depression screen PHQ 2/9  Decreased Interest 1  0  Down, Depressed, Hopeless 1 0 0  PHQ - 2 Score 2 0 0  Altered sleeping 0 3  3  Tired, decreased energy 1 3 3   Change in appetite 0 0 0  Feeling bad or failure about yourself  0 0 0  Trouble concentrating 3 0 0  Moving slowly or fidgety/restless 0 0 0  Suicidal thoughts 0 0 0  PHQ-9 Score 6 6 6   Difficult doing work/chores Somewhat difficult Not difficult at all Not difficult at all      03/26/2024   10:05 AM 02/16/2024    4:26 PM 02/06/2024   11:35 AM 02/10/2023    9:43 AM  GAD 7 : Generalized Anxiety Score  Nervous, Anxious, on Edge 1 0 0 0  Control/stop worrying 1 0 0 0  Worry too much - different things 1 0 0 0  Trouble relaxing 0 0 0 0  Restless 0 0 0 0  Easily annoyed or irritable 2 0 0 0  Afraid - awful might happen 0 0 0 0  Total GAD 7 Score 5 0 0 0  Anxiety Difficulty Somewhat difficult Not difficult at all Not difficult at all Not difficult at all    A/P: situational stressors  and anxiety have increased. We discussed possibly changing to citalopram 40 mg or reengaging with therapist or pursuing some things for herself and not just investing in others. She wants to try investing in herself and using therapist potentially to help her get there but if not improving within a few months I want her to reach out and we could try citalopram or even Cymbalta. I also loved her idea of getting out in nature more and walking- she finds that refreshing    #Lichen sclerosus- does have gyn areas but also other areas- November 2022- multiple areas- white ptaches- has seen derm but may ask us  to rx cream- steroid cream. Diagnosed on biopsy. Cutting out sugar sweetened beverages helps   # Prediabetes-peak A1c 5.7in August 2025- discussed healthy eating and regular exercise goals  Recommended follow up: Return in about 6 months (around 09/24/2024) for followup or sooner if needed.Schedule b4 you leave.  Lab/Order associations: fasting   ICD-10-CM   1. Preventative health care  Z00.00       No orders of the defined types were placed in this encounter.   Return precautions advised.  Garnette Lukes, MD

## 2024-03-26 NOTE — Patient Instructions (Addendum)
 Health Maintenance Due  Topic Date Due   Colonoscopy  07/12/2024  They should contact you about this  Flu shot today  along with Prevnar 20  situational stressors and anxiety have increased. We discussed possibly changing to citalopram 40 mg or reengaging with therapist or pursuing some things for herself and not just investing in others. She wants to try investing in herself and using therapist potentially to help her get there but if not improving within a few months I want her to reach out and we could try citalopram or even Cymbalta. I also loved her idea of getting out in nature more and walking- she finds that refreshing    blood pressure is up today to high acceptable range but her stressors are much higher. Id like for her to monitor at home and we can consider increasing dose if over 140/90 on average    osteoarthritis ongoing- plans to schedule follow up with orthopedic for back and we will have her drop off copy of labs from husbands work next month    Recommended follow up: Return in about 6 months (around 09/24/2024) for followup or sooner if needed.Schedule b4 you leave.

## 2024-04-09 DIAGNOSIS — G8929 Other chronic pain: Secondary | ICD-10-CM | POA: Diagnosis not present

## 2024-04-09 DIAGNOSIS — Z96642 Presence of left artificial hip joint: Secondary | ICD-10-CM | POA: Diagnosis not present

## 2024-04-09 DIAGNOSIS — M545 Low back pain, unspecified: Secondary | ICD-10-CM | POA: Diagnosis not present

## 2024-05-07 DIAGNOSIS — M5440 Lumbago with sciatica, unspecified side: Secondary | ICD-10-CM | POA: Diagnosis not present

## 2024-05-28 DIAGNOSIS — R293 Abnormal posture: Secondary | ICD-10-CM | POA: Diagnosis not present

## 2024-05-28 DIAGNOSIS — M5442 Lumbago with sciatica, left side: Secondary | ICD-10-CM | POA: Diagnosis not present

## 2024-05-29 ENCOUNTER — Other Ambulatory Visit: Payer: Self-pay | Admitting: Family Medicine

## 2024-05-29 DIAGNOSIS — M1990 Unspecified osteoarthritis, unspecified site: Secondary | ICD-10-CM

## 2024-06-01 ENCOUNTER — Other Ambulatory Visit: Payer: Self-pay | Admitting: Internal Medicine

## 2024-06-03 DIAGNOSIS — R293 Abnormal posture: Secondary | ICD-10-CM | POA: Diagnosis not present

## 2024-06-03 DIAGNOSIS — M5442 Lumbago with sciatica, left side: Secondary | ICD-10-CM | POA: Diagnosis not present

## 2024-06-07 DIAGNOSIS — R293 Abnormal posture: Secondary | ICD-10-CM | POA: Diagnosis not present

## 2024-06-07 DIAGNOSIS — M5442 Lumbago with sciatica, left side: Secondary | ICD-10-CM | POA: Diagnosis not present

## 2024-06-10 DIAGNOSIS — R293 Abnormal posture: Secondary | ICD-10-CM | POA: Diagnosis not present

## 2024-06-10 DIAGNOSIS — M5442 Lumbago with sciatica, left side: Secondary | ICD-10-CM | POA: Diagnosis not present

## 2024-06-14 DIAGNOSIS — R293 Abnormal posture: Secondary | ICD-10-CM | POA: Diagnosis not present

## 2024-06-14 DIAGNOSIS — M5442 Lumbago with sciatica, left side: Secondary | ICD-10-CM | POA: Diagnosis not present

## 2024-06-18 DIAGNOSIS — M5442 Lumbago with sciatica, left side: Secondary | ICD-10-CM | POA: Diagnosis not present

## 2024-06-18 DIAGNOSIS — R293 Abnormal posture: Secondary | ICD-10-CM | POA: Diagnosis not present

## 2024-07-23 ENCOUNTER — Other Ambulatory Visit: Payer: Self-pay | Admitting: Family Medicine

## 2024-07-23 DIAGNOSIS — F325 Major depressive disorder, single episode, in full remission: Secondary | ICD-10-CM

## 2024-07-23 DIAGNOSIS — F411 Generalized anxiety disorder: Secondary | ICD-10-CM

## 2024-09-27 ENCOUNTER — Ambulatory Visit: Admitting: Family Medicine
